# Patient Record
Sex: Female | Born: 1955
Health system: Southern US, Community
[De-identification: ages and names within clinical notes are randomized; demographics above are authoritative.]

## PROBLEM LIST (undated history)

## (undated) ENCOUNTER — Ambulatory Visit: Source: Home / Self Care

## (undated) DIAGNOSIS — M1990 Unspecified osteoarthritis, unspecified site: Secondary | ICD-10-CM

## (undated) DIAGNOSIS — M810 Age-related osteoporosis without current pathological fracture: Secondary | ICD-10-CM

## (undated) DIAGNOSIS — F419 Anxiety disorder, unspecified: Secondary | ICD-10-CM

## (undated) DIAGNOSIS — E785 Hyperlipidemia, unspecified: Secondary | ICD-10-CM

## (undated) DIAGNOSIS — Z9189 Other specified personal risk factors, not elsewhere classified: Secondary | ICD-10-CM

## (undated) DIAGNOSIS — K635 Polyp of colon: Secondary | ICD-10-CM

## (undated) DIAGNOSIS — T7840XA Allergy, unspecified, initial encounter: Secondary | ICD-10-CM

## (undated) DIAGNOSIS — G629 Polyneuropathy, unspecified: Secondary | ICD-10-CM

## (undated) DIAGNOSIS — D649 Anemia, unspecified: Secondary | ICD-10-CM

## (undated) DIAGNOSIS — E049 Nontoxic goiter, unspecified: Secondary | ICD-10-CM

## (undated) DIAGNOSIS — F32A Depression, unspecified: Secondary | ICD-10-CM

## (undated) DIAGNOSIS — I10 Essential (primary) hypertension: Secondary | ICD-10-CM

## (undated) DIAGNOSIS — K2970 Gastritis, unspecified, without bleeding: Secondary | ICD-10-CM

## (undated) DIAGNOSIS — G4730 Sleep apnea, unspecified: Secondary | ICD-10-CM

## (undated) DIAGNOSIS — M797 Fibromyalgia: Secondary | ICD-10-CM

## (undated) DIAGNOSIS — J189 Pneumonia, unspecified organism: Secondary | ICD-10-CM

## (undated) DIAGNOSIS — K449 Diaphragmatic hernia without obstruction or gangrene: Secondary | ICD-10-CM

## (undated) DIAGNOSIS — K219 Gastro-esophageal reflux disease without esophagitis: Secondary | ICD-10-CM

## (undated) DIAGNOSIS — G43909 Migraine, unspecified, not intractable, without status migrainosus: Secondary | ICD-10-CM

## (undated) DIAGNOSIS — F329 Major depressive disorder, single episode, unspecified: Secondary | ICD-10-CM

## (undated) DIAGNOSIS — N189 Chronic kidney disease, unspecified: Secondary | ICD-10-CM

## (undated) HISTORY — DX: Unspecified osteoarthritis, unspecified site: M19.90

## (undated) HISTORY — PX: FRACTURE SURGERY: SHX138

## (undated) HISTORY — DX: Sleep apnea, unspecified: G47.30

## (undated) HISTORY — DX: Polyp of colon: K63.5

## (undated) HISTORY — DX: Hyperlipidemia, unspecified: E78.5

## (undated) HISTORY — DX: Chronic kidney disease, unspecified: N18.9

## (undated) HISTORY — DX: Allergy, unspecified, initial encounter: T78.40XA

## (undated) HISTORY — DX: Anxiety disorder, unspecified: F41.9

## (undated) HISTORY — DX: Diaphragmatic hernia without obstruction or gangrene: K44.9

## (undated) HISTORY — DX: Depression, unspecified: F32.A

## (undated) HISTORY — PX: TUBAL LIGATION: SHX77

## (undated) HISTORY — DX: Anemia, unspecified: D64.9

## (undated) HISTORY — DX: Age-related osteoporosis without current pathological fracture: M81.0

## (undated) HISTORY — DX: Polyneuropathy, unspecified: G62.9

## (undated) HISTORY — PX: JOINT REPLACEMENT: SHX530

## (undated) HISTORY — DX: Nontoxic goiter, unspecified: E04.9

## (undated) HISTORY — DX: Other specified personal risk factors, not elsewhere classified: Z91.89

## (undated) HISTORY — DX: Gastritis, unspecified, without bleeding: K29.70

## (undated) HISTORY — PX: ABDOMINAL HYSTERECTOMY: SHX81

## (undated) HISTORY — PX: PARTIAL HYSTERECTOMY: SHX80

## (undated) HISTORY — DX: Major depressive disorder, single episode, unspecified: F32.9

## (undated) HISTORY — DX: Pneumonia, unspecified organism: J18.9

## (undated) HISTORY — PX: SHOULDER SURGERY: SHX246

---

## 1998-03-28 ENCOUNTER — Encounter: Payer: Self-pay | Admitting: Emergency Medicine

## 1998-03-28 ENCOUNTER — Emergency Department (HOSPITAL_COMMUNITY): Admission: EM | Admit: 1998-03-28 | Discharge: 1998-03-28 | Payer: Self-pay | Admitting: Emergency Medicine

## 1998-04-02 ENCOUNTER — Ambulatory Visit (HOSPITAL_COMMUNITY): Admission: RE | Admit: 1998-04-02 | Discharge: 1998-04-02 | Payer: Self-pay | Admitting: *Deleted

## 2006-01-01 ENCOUNTER — Encounter: Admission: RE | Admit: 2006-01-01 | Discharge: 2006-01-01 | Payer: Self-pay | Admitting: Geriatric Medicine

## 2007-08-05 HISTORY — PX: ROTATOR CUFF REPAIR: SHX139

## 2008-03-09 ENCOUNTER — Ambulatory Visit: Payer: Self-pay | Admitting: Cardiology

## 2008-03-10 ENCOUNTER — Inpatient Hospital Stay (HOSPITAL_COMMUNITY): Admission: EM | Admit: 2008-03-10 | Discharge: 2008-03-11 | Payer: Self-pay | Admitting: Emergency Medicine

## 2008-03-10 ENCOUNTER — Encounter (INDEPENDENT_AMBULATORY_CARE_PROVIDER_SITE_OTHER): Payer: Self-pay | Admitting: Internal Medicine

## 2008-03-10 ENCOUNTER — Ambulatory Visit: Payer: Self-pay | Admitting: Vascular Surgery

## 2010-12-17 NOTE — Discharge Summary (Signed)
[name], [name]               ACCOUNT NO.:  [id number]   MEDICAL RECORD NO.:  [id number]          PATIENT TYPE:  INP   LOCATION:  3041                         FACILITY:  [facility]   PHYSICIAN:  Elliot Cousin, M.D.    DATE OF BIRTH:  February 08, 1956   DATE OF ADMISSION:  03/10/2008  DATE OF DISCHARGE:  03/11/2008                               DISCHARGE SUMMARY   DISCHARGE DIAGNOSES:  1. Dysarthria, unsteady gait, right-sided facial droop, probably      secondary to hypoglycemia.  2. Hypoglycemia secondary to metformin therapy concomitant with a      restrictive diet.  3. Hypokalemia, repleted during the hospitalization.  4. Pre-diabetes mellitus.   DISCHARGE MEDICATIONS:  1. Stop Metformin.  2. Lyrica 150 mg daily.  3. Alprazolam 0.5 mg p.r.n. daily.  4. Fluoxetine 40 mg daily.  5. Oxybutynin 5 mg t.i.d.  6. Topamax 100 mg b.i.d.  7. Treximet 85/500 mg daily p.r.n.  8. Estratest 1.25/2.5 mg daily.  9. Kapidex 60 mg daily.  10.Phentermine 37.5 mg daily.   DISCHARGE DISPOSITION:  The patient is being discharged to home in  improved and stable condition.  She was advised to follow up with Dr.  Quintella Reichert in 1 week.   CONSULTATIONS:  None.   PROCEDURE PERFORMED:  1. MRI of the brain on March 10, 2008.  The results revealed no acute      or reversible process.  Minimal chronic small vessel change in the      hemispheric deep white matter.  2. MRA of the head on March 10, 2008.  The results revealed normal      intracranial MR angiography.  3. MRA of the neck on March 10, 2008.  The results revealed smooth      plaque along the posterior wall of the internal carotid artery bulb      on the right.  No measurable stenosis.  Remainder of the vessels      appeared normal.  4. Chest x-ray on March 10, 2008.  The results revealed low lung      volumes with minimal left base airspace disease, most likely      atelectasis.  5. CT scan of the head on March 09, 2008.  The results revealed  no      acute intracranial abnormality.  6. A 2D echocardiogram, results pending.   HISTORY OF PRESENT ILLNESS:  The patient is a 55 year old woman with a  past medical history significant for migraine headaches, depression, and  fibromyalgia.  She presented to the emergency department on March 10, 2008, after her family noticed that she had a right facial droop and  slurred speech.  When the patient was evaluated in the emergency  department, her venous glucose was noted to be 56.  The patient was  therefore admitted for further evaluation and management.   For additional details, please see the dictated history and physical.   HOSPITAL COURSE:  1. DYSARTHRIA, RIGHT FACIAL DROOP, REPORTED HISTORY OF HEADACHE, and      DIFFICULTY AMBULATING.  The patient was given dextrose in the  emergency department.  A CT scan of the head was ordered and it      revealed no acute intracranial findings.  The dose of the metformin      was decreased to only 500 mg daily.  Her capillary blood glucose      was monitored closely.  For further evaluation, an MRI of the      brain, MRA of the head, MRA of the neck, TSH, cardiac enzymes, and      a fasting lipid panel were ordered.  The results of the MRI and MRA      of the head and neck were essentially unremarkable.  Her cardiac      enzymes were within normal limits.  Her fasting lipid panel      revealed a total cholesterol of 122, triglycerides of 47, HDL of      26, and LDL of 87.  Her TSH was within normal limits at 0.699.      Carotid Doppler study was ordered as well and the results revealed      no bilateral ICA stenosis.  Vertebral artery flow was antegrade.      The results of the 2D echocardiogram are currently pending.   In my conversation with the patient, she disclosed that she had been  never diagnosed with diabetes mellitus; however, she believes that she  was started on metformin therapy because she was at risk and because of   her family history of diabetes and obesity.  In addition, the patient  had been following a restrictive diet consisting of a protein drinks 3  times a day and with little food otherwise.  More than likely, the  patient's symptomatology was secondary to hypoglycemia as a consequence  of metformin therapy and the restrictive diet.  The patient was given  information on a carbohydrate modified diet.  Prior to hospital  discharge, she was advised to discontinue the metformin.  Further  management will be deferred to Dr. Quintella Reichert.  Also, the patient was  advised to consider decreasing the dose of Lyrica from 150 mg t.i.d. to  150 mg just once daily.  1. PROBABLE PRE-DIABETES MELLITUS.  As stated above, the dose of the      metformin was decreased from 850 mg half a tablet t.i.d. to 500 mg      daily during the hospitalization.  Her hemoglobin A1c was 5.5.  As      stated above, the patient was advised to stop the metformin for      now.   The patient was advised to call the [facility] next week to obtain  the results of the 2D echocardiogram.  The patient voiced understanding.      Elliot Cousin, M.D.  Electronically Signed     DF/MEDQ  D:  03/11/2008  T:  03/12/2008  Job:  [id number]   cc:   Lacretia Leigh. Quintella Reichert, M.D.

## 2010-12-17 NOTE — H&P (Signed)
[name], [name]               ACCOUNT NO.:  [id number]   MEDICAL RECORD NO.:  [id number]          PATIENT TYPE:  EMS   LOCATION:  MAJO                         FACILITY:  [facility]   PHYSICIAN:  [name], MDDATE OF BIRTH:  [birth year]   DATE OF ADMISSION:  03/09/2008  DATE OF DISCHARGE:                              HISTORY & PHYSICAL   PRIMARY CARE PHYSICIAN:  Melissa Leigh. Quintella Reichert, MD.   She is a full code.  There is no health care power of attorney.   PRESENTING COMPLAINT:  Headache 3 weeks, slurred speech, unsteady gait,  dizziness, and right facial droop 1 day.   HISTORY OF PRESENTING COMPLAINT:  This 55 year old Caucasian lady has  been having on and off frontal headache for the last 3 weeks.  However,  at 5 p.m. today, while sitting at the dinner table with her husband, the  husband noticed that she was having slurred speech and right facial  droop.  The patient was complaining of dizziness and inability to  express herself properly verbally.  She was also noted to have an  unsteady gait by the husband. transferred by EMT to the emergency room.  On presentation, she had a low blood sugar of 56.  The patient claims  she has been on liquid diet to lose weight in the last few weeks.  She  denies use of laxatives.  All her neurological deficits had been  normalized by the time she got to the emergency room.  Currently, she is  asymptomatic.  However, blood pressure was borderline low at 96/64 at  presentation.  The patient says her primary care physician has stopped  her lisinopril a few weeks ago.   PAST MEDICAL HISTORY:  Depression, diabetes, hypertension, fibromyalgia,  borderline lupus, migraine headache, and osteoarthritis.   PAST SURGERIES:  Dr. Jerl [name], Orthopedics,  partial hysterectomy many years ago.   FAMILY HISTORY:  Father had CVA and myocardial infraction.  Mother had  myocardial infarction.   SOCIAL HISTORY:  She is a former smoker.  No current  history of alcohol  or tobacco abuse.   REVIEW OF SYSTEMS:  Twelve systems reviewed, pertinent positives as in  the history of presenting complaints.   MEDICATIONS AT HOME:  1. Lyrica 150 mg daily.  2. Alprazolam 0.5 mg p.r.n. daily.  3. Fluoxetine 40 mg daily.  4. Metformin 850 mg half tablet b.i.d.  5. Oxybutynin 5 mg t.i.d.  6. Topamax 100 mg b.i.d.  7. Treximet 85/500 daily p.r.n.  8. Estratest 1 daily.  9. Kapidex 1 daily.  10.Phentermine 37.5 mg daily.  11.Lisinopril discontinued.   ALLERGIES:  BACTRIM AND NORGESIC FORTE.   PHYSICAL EXAMINATION:  GENERAL:  She is a middle-aged lady not in acute  respiratory distress.  VITAL SIGNS: Temperature is 97, pulse is 55, respiratory rate is 18, and  blood pressure 106/72.  HEENT:  Pupils are equal, reactive to light and accommodation.  Extraocular muscles are intact.  Head is atraumatic and normocephalic.  Mucous membranes are moist.  Oropharynx and nasopharynx are clear.  NECK:  Supple.  There is no elevated JVD  or thyromegaly.  No  submandibular lymphadenopathy.  Apex beat 5th intercostal space midclavicular line.  HEART:  No gallops, rubs, or murmurs.  CHEST:  Clinically clear.  ABDOMEN:  Benign.  NEUROLOGIC:  She is alert and oriented to time, place, and person.  No  neurological deficits.  Cranial nerves II through XII are intact.  Peripheral pulses are present.  No pedal edema.   LABORATORY DATA:  Labs show WBC of 4.1, hematocrit 41, and platelet  count 245.  Urinalysis is negative.  PT 14.8, INR 1.1, and PTT 33.  CT  of the head is negative for intracranial abnormality.  Chemistry shows a  glucose of 56, sodium 141, potassium 3.4, chloride 106, bicarbonate 15,  BUN 15, and creatinine 1.1.   ASSESSMENT:  Rule out transient ischemic attack versus complex migraine,  hyperglycemia, hyperkalemia, and hypertension.  She will be admitted to  neurological bed initially for observation and for workup with 2D  echocardiogram,  carotid Doppler, ESR, lupus anticoagulant.  We will get  a cardiac enzymes, EKG every 8 hours x3, check fasting lipid, thyroid  function test.  Obtain MRI of the brain in the morning.  Diet will be  heart healthy with 1800-calorie ADA, IV fluids on normal saline at 100  mL an hour with 40 mEq of KCl for the first one liter.   She is to be on bedrest, seizure, fall and aspiration precautions,  bedside swallow evaluation.  Heparin 5000 units subcu q.8 h. for DVT  prophylaxis.  Continue her home medications and reduce her metformin to  500 mg daily, Accu-Cheks q. 4 for the first 12 hours, and D50 half by IV  p.r.n. every 6 hours if glucose is less than 70.  We will hold her  Treximet, Kapidex, Estratest, and lisinopril.  We will repeat her BMP in  the morning.      Melissa Saxon, MD  Electronically Signed     MIO/MEDQ  D:  03/10/2008  T:  03/10/2008  Job:  [id number]

## 2011-05-02 LAB — LIPID PANEL
HDL: 26 — ABNORMAL LOW
Total CHOL/HDL Ratio: 4.7
Triglycerides: 47
VLDL: 9

## 2011-05-02 LAB — HEMOGLOBIN A1C: Hgb A1c MFr Bld: 5.5

## 2011-05-02 LAB — COMPREHENSIVE METABOLIC PANEL
ALT: 11
Alkaline Phosphatase: 43
CO2: 21
Chloride: 112
Glucose, Bld: 98
Potassium: 4
Sodium: 137
Total Bilirubin: 0.7
Total Protein: 5.2 — ABNORMAL LOW

## 2011-05-02 LAB — BASIC METABOLIC PANEL
Calcium: 8 — ABNORMAL LOW
GFR calc Af Amer: >60
GFR calc non Af Amer: >60
Glucose, Bld: 95
Potassium: 3.9
Sodium: 139

## 2011-05-02 LAB — CBC
HCT: 38.2
HCT: 41.9
Hemoglobin: 12.9
Hemoglobin: 14.2
RBC: 4.11
RBC: 4.47
RDW: 12.9
RDW: 12.9
WBC: 3.9 — ABNORMAL LOW
WBC: 4.1

## 2011-05-02 LAB — URINALYSIS, ROUTINE W REFLEX MICROSCOPIC
Glucose, UA: NEGATIVE
Hgb urine dipstick: NEGATIVE
Ketones, ur: NEGATIVE
Protein, ur: NEGATIVE
pH: 5

## 2011-05-02 LAB — CARDIAC PANEL(CRET KIN+CKTOT+MB+TROPI)
CK, MB: 1.4
Relative Index: INVALID
Total CK: 80
Troponin I: <0.01

## 2011-05-02 LAB — CK TOTAL AND CKMB (NOT AT ARMC)
Relative Index: 1.5
Total CK: 106

## 2011-05-02 LAB — PROTIME-INR: Prothrombin Time: 14.8

## 2011-05-02 LAB — CALCIUM: Calcium: 8 — ABNORMAL LOW

## 2011-05-02 LAB — DIFFERENTIAL
Eosinophils Relative: 2
Lymphocytes Relative: 36
Lymphs Abs: 1.5
Monocytes Absolute: 0.4
Monocytes Relative: 11
Neutro Abs: 2.1

## 2011-05-02 LAB — APTT: aPTT: 33

## 2011-05-02 LAB — POCT I-STAT, CHEM 8
BUN: 15
Calcium, Ion: 1.24
Chloride: 106
Creatinine, Ser: 1.1
TCO2: 23

## 2011-05-02 LAB — TROPONIN I: Troponin I: 0.01

## 2011-05-02 LAB — LUPUS ANTICOAGULANT PANEL: DRVVT: 34.3 — ABNORMAL LOW (ref 36.1–47.0)

## 2011-05-02 LAB — PHOSPHORUS: Phosphorus: 2.5

## 2011-11-27 ENCOUNTER — Other Ambulatory Visit (HOSPITAL_COMMUNITY): Payer: Self-pay | Admitting: Endocrinology

## 2011-11-27 DIAGNOSIS — E042 Nontoxic multinodular goiter: Secondary | ICD-10-CM

## 2011-12-08 ENCOUNTER — Ambulatory Visit (HOSPITAL_COMMUNITY)
Admission: RE | Admit: 2011-12-08 | Discharge: 2011-12-08 | Disposition: A | Discharge status: Home / Self Care | Payer: BC Managed Care – PPO | Source: Ambulatory Visit | Attending: Endocrinology | Admitting: Endocrinology

## 2011-12-08 DIAGNOSIS — E042 Nontoxic multinodular goiter: Secondary | ICD-10-CM

## 2011-12-08 NOTE — Procedures (Signed)
US guided FNAs of left mid pole nodule.  No immediate complication.

## 2011-12-09 ENCOUNTER — Telehealth (HOSPITAL_COMMUNITY): Payer: Self-pay | Admitting: *Deleted

## 2013-08-04 HISTORY — PX: SHOULDER SURGERY: SHX246

## 2014-01-25 ENCOUNTER — Emergency Department (HOSPITAL_BASED_OUTPATIENT_CLINIC_OR_DEPARTMENT_OTHER)
Admission: EM | Admit: 2014-01-25 | Discharge: 2014-01-25 | Disposition: A | Discharge status: Home / Self Care | Payer: Worker's Compensation | Attending: Emergency Medicine | Admitting: Emergency Medicine

## 2014-01-25 ENCOUNTER — Encounter (HOSPITAL_BASED_OUTPATIENT_CLINIC_OR_DEPARTMENT_OTHER): Payer: Self-pay | Admitting: Emergency Medicine

## 2014-01-25 ENCOUNTER — Emergency Department (HOSPITAL_BASED_OUTPATIENT_CLINIC_OR_DEPARTMENT_OTHER): Payer: Worker's Compensation

## 2014-01-25 DIAGNOSIS — S43004A Unspecified dislocation of right shoulder joint, initial encounter: Secondary | ICD-10-CM

## 2014-01-25 DIAGNOSIS — X503XXA Overexertion from repetitive movements, initial encounter: Secondary | ICD-10-CM | POA: Insufficient documentation

## 2014-01-25 DIAGNOSIS — G43909 Migraine, unspecified, not intractable, without status migrainosus: Secondary | ICD-10-CM | POA: Insufficient documentation

## 2014-01-25 DIAGNOSIS — S43086A Other dislocation of unspecified shoulder joint, initial encounter: Secondary | ICD-10-CM | POA: Insufficient documentation

## 2014-01-25 DIAGNOSIS — I10 Essential (primary) hypertension: Secondary | ICD-10-CM | POA: Insufficient documentation

## 2014-01-25 DIAGNOSIS — Y9289 Other specified places as the place of occurrence of the external cause: Secondary | ICD-10-CM | POA: Insufficient documentation

## 2014-01-25 DIAGNOSIS — Z8719 Personal history of other diseases of the digestive system: Secondary | ICD-10-CM | POA: Insufficient documentation

## 2014-01-25 DIAGNOSIS — Y990 Civilian activity done for income or pay: Secondary | ICD-10-CM | POA: Insufficient documentation

## 2014-01-25 HISTORY — DX: Gastro-esophageal reflux disease without esophagitis: K21.9

## 2014-01-25 HISTORY — DX: Migraine, unspecified, not intractable, without status migrainosus: G43.909

## 2014-01-25 HISTORY — DX: Essential (primary) hypertension: I10

## 2014-01-25 HISTORY — DX: Fibromyalgia: M79.7

## 2014-01-25 MED ORDER — PROPOFOL BOLUS VIA INFUSION
0.5000 mg/kg | Freq: Once | INTRAVENOUS | Status: AC
  Administered 2014-01-25: 52.15 mg via INTRAVENOUS
  Filled 2014-01-25: qty 53

## 2014-01-25 MED ORDER — OXYCODONE-ACETAMINOPHEN 5-325 MG PO TABS
2.0000 | ORAL_TABLET | ORAL | Status: DC | PRN
Start: 2014-01-25 — End: 2015-05-28

## 2014-01-25 MED ORDER — ONDANSETRON HCL 4 MG/2ML IJ SOLN
4.0000 mg | Freq: Once | INTRAMUSCULAR | Status: AC
  Administered 2014-01-25: 4 mg via INTRAVENOUS

## 2014-01-25 MED ORDER — MORPHINE SULFATE 4 MG/ML IJ SOLN
INTRAMUSCULAR | Status: AC
  Administered 2014-01-25: 4 mg via INTRAVENOUS
  Filled 2014-01-25: qty 1

## 2014-01-25 MED ORDER — ONDANSETRON HCL 4 MG/2ML IJ SOLN
INTRAMUSCULAR | Status: AC
  Administered 2014-01-25: 4 mg via INTRAVENOUS
  Filled 2014-01-25: qty 2

## 2014-01-25 MED ORDER — MORPHINE SULFATE 4 MG/ML IJ SOLN
4.0000 mg | Freq: Once | INTRAMUSCULAR | Status: AC
  Administered 2014-01-25: 4 mg via INTRAVENOUS

## 2014-01-25 MED ORDER — PROPOFOL 10 MG/ML IV BOLUS
INTRAVENOUS | Status: AC
  Administered 2014-01-25: 50 mg
  Filled 2014-01-25: qty 20

## 2014-01-25 NOTE — Discharge Instructions (Signed)
Keep shoulder immobilizer intact until follow up with Dr. Norm Salt. Take percocet as needed for pain. Refer to attached documents ofr more information.

## 2014-01-25 NOTE — ED Provider Notes (Signed)
The patient was sent from an outpatient facility. Patient was seen in an urgent care and had x-rays demonstrating a shoulder dislocation. She was sent to the emergency room for further treatment Physical Exam  BP 149/82  Pulse 62  Temp(Src) 99.5 F (37.5 C) (Oral)  Resp 20  Ht 5\' 6"  (1.676 m)  Wt 230 lb (104.327 kg)  BMI 37.14 kg/m2  SpO2 98%  Physical Exam  Nursing note and vitals reviewed. Constitutional: She appears well-developed and well-nourished. No distress.  HENT:  Head: Normocephalic and atraumatic.  Right Ear: External ear normal.  Left Ear: External ear normal.  Eyes: Conjunctivae are normal. Right eye exhibits no discharge. Left eye exhibits no discharge. No scleral icterus.  Neck: Neck supple. No tracheal deviation present.  Cardiovascular: Normal rate.   Pulmonary/Chest: Effort normal. No stridor. No respiratory distress.  Musculoskeletal: She exhibits no edema.       Right shoulder: She exhibits decreased range of motion, tenderness, bony tenderness and deformity. She exhibits normal pulse and normal strength.  Right glenohumeral dislocation  Neurological: She is alert. Cranial nerve deficit: no gross deficits.  Skin: Skin is warm and dry. No rash noted.  Psychiatric: She has a normal mood and affect.    ED Course  Procedural sedation Date/Time: 01/25/2014 6:39 PM Performed by: Dorie Rank Authorized by: Dorie Rank Consent: Verbal consent obtained. written consent obtained. Risks and benefits: risks, benefits and alternatives were discussed Preparation: Patient was prepped and draped in the usual sterile fashion. Local anesthesia used: no Patient sedated: yes Sedation type: moderate (conscious) sedation Sedatives: propofol Vitals: Vital signs were monitored during sedation. Comments: Please see nursing notes for start and stop times. Approximately 20 minutes for the procedure.  Patient tolerated well without difficulty  ORTHOPEDIC INJURY  TREATMENT Date/Time: 01/25/2014 6:41 PM Performed by: Dorie Rank Authorized by: Dorie Rank Consent: Verbal consent obtained. written consent obtained. Risks and benefits: risks, benefits and alternatives were discussed Consent given by: patient Injury location: shoulder Injury type: dislocation Chronicity: new Pre-procedure neurovascular assessment: neurovascularly intact Pre-procedure distal perfusion: normal Pre-procedure neurological function: normal Pre-procedure range of motion: reduced Local anesthesia used: no Patient sedated: yes Manipulation performed: yes Reduction method: external rotation Reduction successful: yes X-ray confirmed reduction: yes Immobilization: sling Post-procedure neurovascular assessment: post-procedure neurovascularly intact Post-procedure distal perfusion: normal Post-procedure neurological function: normal Post-procedure range of motion: normal   Dg Shoulder Right  01/25/2014   CLINICAL DATA:  Fall.  Right shoulder injury.  EXAM: RIGHT SHOULDER - 2+ VIEW  COMPARISON:  None.  FINDINGS: Two views include in internally rotated view and a transscapular view. An axillary view is not provided.  No dislocation identified. No fracture is observed. Laterally downsloping acromion may predispose to impingement.  Right fifth rib deformity posterolaterally favors old fracture. This was visible on the 2009 chest radiograph.  IMPRESSION: 1. No acute bony findings. 2. Laterally downsloping acromion may predispose to impingement. 3. Old right fifth rib fracture, healed.   Electronically Signed   By: Sherryl Barters M.D.   On: 01/25/2014 18:55    MDM Successful reduction of her right shoulder dislocation. Patient has returned to her pre procedural baseline. She is alert and oriented in no distress.     examination/treatment/procedure(s) were conducted as a shared visit with non-physician practitioner(s) and myself.  I personally evaluated the patient during the  encounter.  I was present at the bedside for the procedural sedation and shoulder reduction.     Dorie Rank, MD 01/28/14 [id number]

## 2014-01-25 NOTE — ED Notes (Signed)
Pt c/o fall x 3 hrs ago , seen at [facility] Dx right shoulder dislocation

## 2014-01-25 NOTE — ED Notes (Signed)
RT at bedside for conscious sedation procedure. Placed on 2 LNC prior to sedation. SAT stable throughout, 95-97%. Spontaneously breathing. RT will monitor as needed

## 2014-01-25 NOTE — ED Provider Notes (Signed)
CSN: [id number]     Arrival date & time 01/25/14  1638 History   First MD Initiated Contact with Patient 01/25/14 1653     Chief Complaint  Patient presents with  . Shoulder Injury     (Consider location/radiation/quality/duration/timing/severity/associated sxs/prior Treatment) HPI Comments: Patient is a 58 year old female who presents from Urgent Care with a right shoulder dislocation. 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 The pain is throbbing and severe without radiation. Patient reports she cannot move her right shoulder. She had xrays done at Urgent Care which showed shoulder dislocation. No other injuries.    Past Medical History  Diagnosis Date  . Migraine   . Hypertension   . GERD (gastroesophageal reflux disease)   . Fibromyalgia    Past Surgical History  Procedure Laterality Date  . Abdominal hysterectomy    . Rotator cuff repair     History reviewed. No pertinent family history. History  Substance Use Topics  . Smoking status: Never Smoker   . Smokeless tobacco: Not on file  . Alcohol Use: No   OB History   Grav Para Term Preterm Abortions TAB SAB Ect Mult Living                 Review of Systems  Constitutional: Negative for fever, chills and fatigue.  HENT: Negative for trouble swallowing.   Eyes: Negative for visual disturbance.  Respiratory: Negative for shortness of breath.   Cardiovascular: Negative for chest pain and palpitations.  Gastrointestinal: Negative for nausea, vomiting, abdominal pain and diarrhea.  Genitourinary: Negative for dysuria and difficulty urinating.  Musculoskeletal: Positive for arthralgias. Negative for neck pain.  Skin: Negative for color change.  Neurological: Negative for dizziness and weakness.  Psychiatric/Behavioral: Negative for dysphoric mood.      Allergies  Sulfa antibiotics  Home Medications   Prior to Admission medications   Medication Sig  Start Date End Date Taking? Authorizing Provider  buPROPion (WELLBUTRIN XL) 300 MG 24 hr tablet Take 300 mg by mouth daily.   Yes Historical Provider, MD  butalbital-acetaminophen-caffeine (FIORICET, ESGIC) 50-325-40 MG per tablet Take by mouth 2 (two) times daily as needed for headache.   Yes Historical Provider, MD  darifenacin (ENABLEX) 15 MG 24 hr tablet Take 60 mg by mouth daily.   Yes Historical Provider, MD  lisinopril (PRINIVIL,ZESTRIL) 20 MG tablet Take 20 mg by mouth daily.   Yes Historical Provider, MD  propranolol (INDERAL) 10 MG tablet Take 120 mg by mouth 3 (three) times daily.   Yes Historical Provider, MD  topiramate (TOPAMAX) 25 MG tablet Take 25 mg by mouth 2 (two) times daily.   Yes Historical Provider, MD   BP 179/98  Pulse 63  Temp(Src) 99.5 F (37.5 C) (Oral)  Resp 16  Ht 5\' 6"  (1.676 m)  Wt 230 lb (104.327 kg)  BMI 37.14 kg/m2  SpO2 98% Physical Exam  Nursing note and vitals reviewed. Constitutional: She is oriented to person, place, and time. She appears well-developed and well-nourished. No distress.  HENT:  Head: Normocephalic and atraumatic.  Eyes: Conjunctivae are normal.  Cardiovascular: Normal rate, regular rhythm and intact distal pulses.  Exam reveals no gallop and no friction rub.   No murmur heard. Pulmonary/Chest: Effort normal and breath sounds normal. She has no wheezes. She has no rales. She exhibits no tenderness.  Musculoskeletal:  Right shoulder obvious deformity with generalized tenderness to palpation. Limited ROM due to  pain and deformity.   Neurological: She is alert and oriented to person, place, and time. Coordination normal.  Sensation intact of distal right arm. Speech is goal-oriented. Moves limbs without ataxia.   Skin: Skin is warm and dry.  Psychiatric: She has a normal mood and affect. Her behavior is normal.    ED Course  Reduction of dislocation Date/Time: 01/25/2014 6:06 PM Performed by: Alvina Chou Authorized by:  Alvina Chou Consent: Verbal consent obtained. written consent obtained. Consent given by: patient Patient understanding: patient states understanding of the procedure being performed Patient consent: the patient's understanding of the procedure matches consent given Test results: test results available and properly labeled Patient identity confirmed: verbally with patient Time out: Immediately prior to procedure a "time out" was called to verify the correct patient, procedure, equipment, support staff and site/side marked as required. Local anesthesia used: no Patient sedated: yes Sedation type: moderate (conscious) sedation Sedatives: propofol Analgesia: morphine Sedation start date/time: 01/25/2014 6:02 PM Sedation end date/time: 01/25/2014 6:06 PM Vitals: Vital signs were monitored during sedation. Patient tolerance: Patient tolerated the procedure well with no immediate complications.   SPLINT APPLICATION Date/Time: 0:09 PM Authorized by: Alvina Chou Consent: Verbal consent obtained. Risks and benefits: risks, benefits and alternatives were discussed Consent given by: patient Splint applied by: me Location details: right shoulder Splint type: shoulder immobilizer Supplies used: shoulder immobilizer Post-procedure: The splinted body part was neurovascularly unchanged following the procedure. Patient tolerance: Patient tolerated the procedure well with no immediate complications.     (including critical care time)  Labs Review Labs Reviewed - No data to display  Imaging Review Dg Shoulder Right  01/25/2014   CLINICAL DATA:  Fall.  Right shoulder injury.  EXAM: RIGHT SHOULDER - 2+ VIEW  COMPARISON:  None.  FINDINGS: Two views include in internally rotated view and a transscapular view. An axillary view is not provided.  No dislocation identified. No fracture is observed. Laterally downsloping acromion may predispose to impingement.  Right fifth rib deformity  posterolaterally favors old fracture. This was visible on the 2009 chest radiograph.  IMPRESSION: 1. No acute bony findings. 2. Laterally downsloping acromion may predispose to impingement. 3. Old right fifth rib fracture, healed.   Electronically Signed   By: Sherryl Barters M.D.   On: 01/25/2014 18:55     EKG Interpretation None      MDM   Final diagnoses:  Shoulder dislocation, right, initial encounter    5:31 PM Patient's xray from urgent care shows likely dislocation. Patient will be sedated for shoulder reduction.   7:51 PM Repeat shoulder xray shows reduction of dislocation. Patient will have orthopedic follow up. Patient will remain in arm sling until follow up. Patient will have percocet for pain. No neurovascular compromise.    Alvina Chou, PA-C 01/25/14 2014

## 2014-01-25 NOTE — ED Notes (Signed)
Pt alert, NAD, calm, interactive, resps e/u, speaking in clear complete sentences, "feels much better", R arm in sling, CMS intact, mentions some numbness along ulnar FA hand and fingers ("mild"). Cap refill <2sec. Husband at [facility].

## 2014-08-19 HISTORY — PX: ULNAR NERVE REPAIR: SHX2594

## 2015-05-28 ENCOUNTER — Ambulatory Visit (INDEPENDENT_AMBULATORY_CARE_PROVIDER_SITE_OTHER): Payer: 59 | Admitting: Family Medicine

## 2015-05-28 ENCOUNTER — Encounter: Payer: Self-pay | Admitting: Family Medicine

## 2015-05-28 VITALS — BP 149/88 | HR 63 | Temp 97.9°F | Resp 16 | Wt 225.4 lb

## 2015-05-28 DIAGNOSIS — I10 Essential (primary) hypertension: Secondary | ICD-10-CM | POA: Diagnosis not present

## 2015-05-28 DIAGNOSIS — K219 Gastro-esophageal reflux disease without esophagitis: Secondary | ICD-10-CM | POA: Diagnosis not present

## 2015-05-28 DIAGNOSIS — M797 Fibromyalgia: Secondary | ICD-10-CM | POA: Insufficient documentation

## 2015-05-28 DIAGNOSIS — E049 Nontoxic goiter, unspecified: Secondary | ICD-10-CM

## 2015-05-28 DIAGNOSIS — N183 Chronic kidney disease, stage 3 unspecified: Secondary | ICD-10-CM

## 2015-05-28 DIAGNOSIS — Z8669 Personal history of other diseases of the nervous system and sense organs: Secondary | ICD-10-CM

## 2015-05-28 DIAGNOSIS — R635 Abnormal weight gain: Secondary | ICD-10-CM | POA: Diagnosis not present

## 2015-05-28 DIAGNOSIS — R350 Frequency of micturition: Secondary | ICD-10-CM | POA: Diagnosis not present

## 2015-05-28 DIAGNOSIS — F4323 Adjustment disorder with mixed anxiety and depressed mood: Secondary | ICD-10-CM | POA: Insufficient documentation

## 2015-05-28 DIAGNOSIS — Z23 Encounter for immunization: Secondary | ICD-10-CM

## 2015-05-28 DIAGNOSIS — Z1239 Encounter for other screening for malignant neoplasm of breast: Secondary | ICD-10-CM

## 2015-05-28 DIAGNOSIS — G4733 Obstructive sleep apnea (adult) (pediatric): Secondary | ICD-10-CM | POA: Insufficient documentation

## 2015-05-28 DIAGNOSIS — G894 Chronic pain syndrome: Secondary | ICD-10-CM

## 2015-05-28 DIAGNOSIS — Z1321 Encounter for screening for nutritional disorder: Secondary | ICD-10-CM | POA: Diagnosis not present

## 2015-05-28 LAB — POCT URINALYSIS DIP (MANUAL ENTRY)
BILIRUBIN UA: NEGATIVE
Glucose, UA: NEGATIVE
LEUKOCYTES UA: NEGATIVE
Nitrite, UA: NEGATIVE
PROTEIN UA: NEGATIVE
Spec Grav, UA: 1.025
Urobilinogen, UA: 0.2
pH, UA: 5.5

## 2015-05-28 LAB — LIPID PANEL
Cholesterol: 219 mg/dL — ABNORMAL HIGH (ref 125–200)
HDL: 55 mg/dL (ref >=46)
LDL CALC: 135 mg/dL — AB (ref <130)
Total CHOL/HDL Ratio: 4 Ratio (ref <=5.0)
Triglycerides: 143 mg/dL (ref <150)
VLDL: 29 mg/dL (ref <30)

## 2015-05-28 LAB — COMPREHENSIVE METABOLIC PANEL
ALT: 22 U/L (ref 6–29)
AST: 29 U/L (ref 10–35)
Albumin: 4.2 g/dL (ref 3.6–5.1)
Alkaline Phosphatase: 68 U/L (ref 33–130)
BUN: 10 mg/dL (ref 7–25)
CHLORIDE: 106 mmol/L (ref 98–110)
CO2: 28 mmol/L (ref 20–31)
CREATININE: 0.86 mg/dL (ref 0.50–1.05)
Calcium: 9.6 mg/dL (ref 8.6–10.4)
Glucose, Bld: 91 mg/dL (ref 65–99)
Potassium: 4.2 mmol/L (ref 3.5–5.3)
SODIUM: 142 mmol/L (ref 135–146)
Total Bilirubin: 0.5 mg/dL (ref 0.2–1.2)
Total Protein: 7.1 g/dL (ref 6.1–8.1)

## 2015-05-28 LAB — CBC
HCT: 40 % (ref 36.0–46.0)
Hemoglobin: 13.7 g/dL (ref 12.0–15.0)
MCH: 30 pg (ref 26.0–34.0)
MCHC: 34.3 g/dL (ref 30.0–36.0)
MCV: 87.7 fL (ref 78.0–100.0)
MPV: 10.2 fL (ref 8.6–12.4)
PLATELETS: 225 10*3/uL (ref 150–400)
RBC: 4.56 MIL/uL (ref 3.87–5.11)
RDW: 13.2 % (ref 11.5–15.5)
WBC: 5.4 10*3/uL (ref 4.0–10.5)

## 2015-05-28 LAB — POC MICROSCOPIC URINALYSIS (UMFC): MUCUS RE: ABSENT

## 2015-05-28 MED ORDER — TOPIRAMATE 50 MG PO TABS: ORAL_TABLET | ORAL | Status: DC

## 2015-05-28 MED ORDER — PROPRANOLOL HCL ER 120 MG PO CP24: 120.0000 mg | ORAL_CAPSULE | Freq: Every day | ORAL | Status: DC

## 2015-05-28 MED ORDER — FLUOXETINE HCL 40 MG PO CAPS: 40.0000 mg | ORAL_CAPSULE | Freq: Every day | ORAL | Status: DC

## 2015-05-28 MED ORDER — DEXLANSOPRAZOLE 60 MG PO CPDR: 60.0000 mg | DELAYED_RELEASE_CAPSULE | Freq: Every day | ORAL | Status: DC

## 2015-05-28 MED ORDER — DARIFENACIN HYDROBROMIDE ER 15 MG PO TB24: 60.0000 mg | ORAL_TABLET | Freq: Every day | ORAL | Status: DC

## 2015-05-28 MED ORDER — GABAPENTIN 300 MG PO CAPS: ORAL_CAPSULE | ORAL | Status: DC

## 2015-05-28 MED ORDER — LISINOPRIL 20 MG PO TABS: 20.0000 mg | ORAL_TABLET | Freq: Every day | ORAL | Status: DC

## 2015-05-28 MED ORDER — ALPRAZOLAM 0.5 MG PO TABS: 0.5000 mg | ORAL_TABLET | Freq: Every evening | ORAL | Status: DC | PRN

## 2015-05-28 MED ORDER — BUPROPION HCL ER (XL) 300 MG PO TB24: 300.0000 mg | ORAL_TABLET | Freq: Every day | ORAL | Status: DC

## 2015-05-28 MED ORDER — OXYCODONE-ACETAMINOPHEN 5-325 MG PO TABS: 1.0000 | ORAL_TABLET | Freq: Three times a day (TID) | ORAL | Status: DC | PRN

## 2015-05-28 NOTE — Progress Notes (Signed)
Subjective:    Patient ID: [name], female    DOB: Jan 12, 1956, 59 y.o.   MRN: [id number]  HPI This is a pleasant 59 yo female who is accompanied by her husband. She presents today to establish care. She was previously seen at [facility]. She had a work related injury 16 months ago and has been out of work Museum/gallery exhibitions officer). She had to have shoulder surgery and ulnar nerve decompression surgery by Dr. Latanya Maudlin. Has been released from Dr. Latanya Maudlin. Is currently seeking disability and has an Forensic psychologist. Has seen Dr. Gaynelle Arabian (hand specialist), who has told her not to expect any improvement. She would like to see Dr. Amedeo Plenty. Takes percocet 2x/day. Had 120 filled 01/25/15. Still has 10-20 tablets remaining.   Has struggled with depression and anxiety her entire life. Has had more difficulties lately with problems with her arm and not being able to do her pottery. Has noticed more depression in the last several weeks. Thinks this is related to the weather getting colder and the anniversary of a relative's death by suicide.  Has chronic pain and fibromyalgia. Has poor sleep and uses CPAP for OSA. Was on lyrica which helped. Stopped due to side effects- weight gain, constipation, fatigue.   Has history of headaches, has good control with topiramate. Has headache 1-2x / month.   Overactive bladder for many years, has taken medication for at least 10 years. Nocturia x 2-3x. Has never seen urology. Has never had urodynamic testing- was scheduled and she had to cancel due to work.   Was diagnosed Stage 3 CKD. Has not been seen by nephrology.   Had screening colonoscopy- due in about 5 years Mammo- over due  Past Medical History  Diagnosis Date  . Migraine   . Hypertension   . GERD (gastroesophageal reflux disease)   . Fibromyalgia   . Allergy   . Anemia   . Anxiety   . Depression   . Chronic kidney disease   . Osteoporosis   . Thyroid disease    Past Surgical History  Procedure  Laterality Date  . Abdominal hysterectomy    . Rotator cuff repair     Family History  Problem Relation Age of Onset  . Heart disease Mother   . Hypertension Mother   . Cancer Father   . Heart disease Father   . Hypertension Father   . Stroke Father    Social History  Substance Use Topics  . Smoking status: Never Smoker   . Smokeless tobacco: None  . Alcohol Use: No    Review of Systems Per HPI    Objective:   Physical Exam Physical Exam  Constitutional: Oriented to person, place, and time. She appears well-developed and well-nourished.  HENT:  Head: Normocephalic and atraumatic.  Eyes: Conjunctivae are normal.  Neck: Normal range of motion. Neck supple.  Cardiovascular: Normal rate, regular rhythm and normal heart sounds.   Pulmonary/Chest: Effort normal and breath sounds normal.  Musculoskeletal: She is wearing a full arm compression sleeve on her right arm. She has decreased ROM of right shoulder, strength right shoulder 3/5, right biceps 3/5, right triceps 3/5 Neurological: Alert and oriented to person, place, and time.  Skin: Skin is warm and dry.  Psychiatric: Normal mood and affect. Behavior is normal. Judgment and thought content normal.  Vitals reviewed. BP 149/88 mmHg  Pulse 63  Temp(Src) 97.9 F (36.6 C) (Oral)  Resp 16  Wt 225 lb 6.4 oz (102.241 kg)  Assessment & Plan:  Had discussion with patient and her husband regarding prioritizing her care. Will refill meds while awaiting records from previous provider.  1. Need for prophylactic vaccination and inoculation against influenza - Flu Vaccine QUAD 36+ mos IM  2. Chronic pain syndrome - she will consult with her attorney regarding getting a second opinion on her arm. I offered her local or tertiary care referral. Not sure if she could benefit from PT/OT to improve quality of life.  - oxyCODONE-acetaminophen (PERCOCET/ROXICET) 5-325 MG tablet; Take 1 tablet by mouth every 8 (eight) hours as needed for  moderate pain or severe pain.  Dispense: 60 tablet; Refill: 0 - gabapentin (NEURONTIN) 300 MG capsule; Take 1 tablet at bedtime for 1 week, then increase to 1 tablet twice a day.  Dispense: 60 capsule; Refill: 3  3. Fibromyalgia - discussed importance of regular sleep and quality exercise  4. OSA (obstructive sleep apnea) - continue CPAP  5. Adjustment disorder with mixed anxiety and depressed mood -again, I encouraged regular exercise, focus on sleep and encouraged her to establish a regular routine since she is not working currently.  - ALPRAZolam (XANAX) 0.5 MG tablet; Take 1 tablet (0.5 mg total) by mouth at bedtime as needed for anxiety.  Dispense: 30 tablet; Refill: 1 - buPROPion (WELLBUTRIN XL) 300 MG 24 hr tablet; Take 1 tablet (300 mg total) by mouth daily.  Dispense: 90 tablet; Refill: 1 - FLUoxetine (PROZAC) 40 MG capsule; Take 1 capsule (40 mg total) by mouth daily.  Dispense: 90 capsule; Refill: 1  6. Chronic kidney disease, stage 3 (moderate) - POCT urinalysis dipstick - POCT Microscopic Urinalysis ([facility]) - CBC - Comprehensive metabolic panel - Microalbumin, urine  7. Gastroesophageal reflux disease, esophagitis presence not specified  - dexlansoprazole (DEXILANT) 60 MG capsule; Take 1 capsule (60 mg total) by mouth daily.  Dispense: 90 capsule; Refill: 1  8. Weight gain - Lipid panel - TSH  9. Encounter for vitamin deficiency screening - Vit D  25 hydroxy (rtn osteoporosis monitoring)  10. Goiter - TSH  11. Urinary frequency - darifenacin (ENABLEX) 15 MG 24 hr tablet; Take 4 tablets (60 mg total) by mouth daily. (Patient taking differently: Take 15 mg by mouth daily. )  Dispense: 90 tablet; Refill: 1 - discussed urology referral in future  12. Essential hypertension - lisinopril (PRINIVIL,ZESTRIL) 20 MG tablet; Take 1 tablet (20 mg total) by mouth daily.  Dispense: 90 tablet; Refill: 1 - propranolol ER (INDERAL LA) 120 MG 24 hr capsule; Take 1 capsule (120 mg  total) by mouth daily.  Dispense: 90 capsule; Refill: 1  13. Hx of migraine headaches - topiramate (TOPAMAX) 50 MG tablet; Take 1-2 tablets nightly for headache prevention  Dispense: 90 tablet; Refill: 1  14. Screening for breast cancer - MM Digital Screening; Future  Follow up in 4-8 weeks for CPE Clarene Reamer, FNP-BC  [facility] [place]  06/01/2015 3:45 PM

## 2015-05-29 ENCOUNTER — Other Ambulatory Visit: Payer: Self-pay

## 2015-05-29 DIAGNOSIS — Z1231 Encounter for screening mammogram for malignant neoplasm of breast: Secondary | ICD-10-CM

## 2015-05-29 LAB — TSH: TSH: 0.881 u[IU]/mL (ref 0.350–4.500)

## 2015-05-29 LAB — MICROALBUMIN, URINE: Microalb, Ur: 1.2 mg/dL

## 2015-05-29 LAB — VITAMIN D 25 HYDROXY (VIT D DEFICIENCY, FRACTURES): VIT D 25 HYDROXY: 31 ng/mL (ref 30–100)

## 2015-05-30 ENCOUNTER — Other Ambulatory Visit: Payer: Self-pay

## 2015-05-30 DIAGNOSIS — R350 Frequency of micturition: Secondary | ICD-10-CM

## 2015-05-30 DIAGNOSIS — Z8669 Personal history of other diseases of the nervous system and sense organs: Secondary | ICD-10-CM

## 2015-05-30 NOTE — Telephone Encounter (Signed)
Pharm faxed req to send a full 90 day Rx for these two meds. Debbie, you had filled other Rxs for 90 days w/1 RF, so I changed the # to reflect 90 day RFs, but wanted to check with you to make sure you didn't want to give less for some reason.

## 2015-05-30 NOTE — Telephone Encounter (Signed)
I spoke with pharmacist regarding these prescriptions. No need to fax.

## 2015-06-01 DIAGNOSIS — N189 Chronic kidney disease, unspecified: Secondary | ICD-10-CM | POA: Insufficient documentation

## 2015-06-01 DIAGNOSIS — Z8669 Personal history of other diseases of the nervous system and sense organs: Secondary | ICD-10-CM | POA: Insufficient documentation

## 2015-06-01 DIAGNOSIS — K219 Gastro-esophageal reflux disease without esophagitis: Secondary | ICD-10-CM | POA: Insufficient documentation

## 2015-06-01 DIAGNOSIS — I10 Essential (primary) hypertension: Secondary | ICD-10-CM | POA: Insufficient documentation

## 2015-06-01 DIAGNOSIS — E049 Nontoxic goiter, unspecified: Secondary | ICD-10-CM | POA: Insufficient documentation

## 2015-06-25 ENCOUNTER — Encounter: Payer: Self-pay | Admitting: Family Medicine

## 2015-06-25 ENCOUNTER — Ambulatory Visit (INDEPENDENT_AMBULATORY_CARE_PROVIDER_SITE_OTHER): Payer: 59 | Admitting: Family Medicine

## 2015-06-25 VITALS — BP 155/87 | HR 57 | Temp 98.1°F | Resp 16 | Ht 66.0 in | Wt 224.0 lb

## 2015-06-25 DIAGNOSIS — F4323 Adjustment disorder with mixed anxiety and depressed mood: Secondary | ICD-10-CM

## 2015-06-25 DIAGNOSIS — Z0000 Encounter for general adult medical examination without abnormal findings: Secondary | ICD-10-CM | POA: Diagnosis not present

## 2015-06-25 DIAGNOSIS — Z87898 Personal history of other specified conditions: Secondary | ICD-10-CM

## 2015-06-25 DIAGNOSIS — N3946 Mixed incontinence: Secondary | ICD-10-CM | POA: Diagnosis not present

## 2015-06-25 DIAGNOSIS — I10 Essential (primary) hypertension: Secondary | ICD-10-CM | POA: Diagnosis not present

## 2015-06-25 DIAGNOSIS — Z23 Encounter for immunization: Secondary | ICD-10-CM | POA: Diagnosis not present

## 2015-06-25 DIAGNOSIS — Z87448 Personal history of other diseases of urinary system: Secondary | ICD-10-CM | POA: Diagnosis not present

## 2015-06-25 DIAGNOSIS — G894 Chronic pain syndrome: Secondary | ICD-10-CM | POA: Diagnosis not present

## 2015-06-25 MED ORDER — ALPRAZOLAM 0.5 MG PO TABS: 0.5000 mg | ORAL_TABLET | Freq: Every evening | ORAL | Status: DC | PRN

## 2015-06-25 NOTE — Progress Notes (Signed)
Subjective:    Patient ID: [name], female    DOB: July 14, 1956, 59 y.o.   MRN: [id number]  HPI This is a pleasant 59 yo female who presents today for CPE. She has been doing a little better with her depression since I saw her last month. Change in the weather has bothered her arm pain. Her husband is having surgery to remove a large kidney stone tomorrow.   Last CPE- last year Mammo- scheduled for 07/16/15 Pap- hysterectomy 1993 Colonoscopy- 2011 Tdap- unknown, will have today Flu- annual Eye- regular  Dental- regular Exercise- walks.   Past Medical History  Diagnosis Date  . Migraine   . Hypertension   . GERD (gastroesophageal reflux disease)   . Fibromyalgia   . Allergy   . Anemia   . Anxiety   . Depression   . Chronic kidney disease   . Osteoporosis   . Thyroid disease    Past Surgical History  Procedure Laterality Date  . Abdominal hysterectomy    . Rotator cuff repair     Family History  Problem Relation Age of Onset  . Heart disease Mother   . Hypertension Mother   . Cancer Father   . Heart disease Father   . Hypertension Father   . Stroke Father    Social History  Substance Use Topics  . Smoking status: Never Smoker   . Smokeless tobacco: None  . Alcohol Use: No   Review of Systems  Constitutional: Positive for diaphoresis (many years) and fatigue (chronic).  HENT: Positive for congestion, ear pain, rhinorrhea and sinus pressure.   Eyes: Positive for photophobia, itching and visual disturbance.  Respiratory: Positive for cough (rarely productive).   Gastrointestinal: Positive for abdominal pain and constipation (uses dulcolax 3x/ week for 3x week BM).  Genitourinary: Positive for urgency and frequency.  Musculoskeletal: Positive for myalgias, back pain, arthralgias, neck pain and neck stiffness.  Skin: Negative.   Allergic/Immunologic: Positive for environmental allergies.  Neurological: Positive for weakness, light-headedness, numbness and  headaches.  Hematological: Negative.   Psychiatric/Behavioral: Positive for sleep disturbance (sleeping a little better), decreased concentration and agitation.       Objective:   Physical Exam Physical Exam  Constitutional: She is oriented to person, place, and time. She appears well-developed and well-nourished. No distress.  HENT:  Head: Normocephalic and atraumatic.  Right Ear: External ear normal.  Left Ear: External ear normal.  Nose: Nose normal.  Mouth/Throat: Oropharynx is clear and moist. No oropharyngeal exudate.  Eyes: Conjunctivae are normal. Pupils are equal, round, and reactive to light.  Neck: Normal range of motion. Neck supple. No JVD present. No thyromegaly present.  Cardiovascular: Normal rate, regular rhythm, normal heart sounds and intact distal pulses.   Pulmonary/Chest: Effort normal and breath sounds normal. Right breast exhibits no inverted nipple, no mass, no nipple discharge, no skin change and no tenderness. Left breast exhibits no inverted nipple, no mass, no nipple discharge, no skin change and no tenderness. Breasts are symmetrical.  Abdominal: Soft. Bowel sounds are normal. She exhibits no distension and no mass. There is no tenderness. There is no rebound and no guarding.  Genitourinary: Vagina normal. Pelvic exam was performed with patient supine. There is no rash, tenderness, lesion or injury on the right labia. There is no rash, tenderness, lesion or injury on the left labia. Cervix surgically absent. No vaginal discharge found. Cystocele noted.  Musculoskeletal: Right shoulder with compression sleeve. Decreased ROM. Lymphadenopathy:    She has no  cervical adenopathy.  Neurological: She is alert and oriented to person, place, and time. She has normal reflexes.  Skin: Skin is warm and dry. She is not diaphoretic.  Psychiatric: She has a normal mood and affect. Her behavior is normal. Judgment and thought content normal.  Vitals reviewed.  BP 155/87  mmHg  Pulse 57  Temp(Src) 98.1 F (36.7 C) (Oral)  Resp 16  Ht 5\' 6"  (1.676 m)  Wt 224 lb (101.606 kg)  BMI 36.17 kg/m2 Wt Readings from Last 3 Encounters:  06/25/15 224 lb (101.606 kg)  05/28/15 225 lb 6.4 oz (102.241 kg)  01/25/14 230 lb (104.327 kg)   Depression screen [facility] 2/9 06/25/2015 05/28/2015  Decreased Interest 1 0  Down, Depressed, Hopeless 1 0  PHQ - 2 Score 2 0  Altered sleeping 1 -  Tired, decreased energy 1 -  Change in appetite 1 -  Feeling bad or failure about yourself  1 -  Trouble concentrating 1 -  Moving slowly or fidgety/restless 0 -  Suicidal thoughts 0 -  PHQ-9 Score 7 -      Assessment & Plan:  1. Annual physical exam - labs were drawn at last visit - Encouraged increased physical activity as tolerated, stress relieving measures (meditation, stretching), good food choices, good sleep hygeine.   2. Adjustment disorder with mixed anxiety and depressed mood - ALPRAZolam (XANAX) 0.5 MG tablet; Take 1 tablet (0.5 mg total) by mouth at bedtime as needed for anxiety.  Dispense: 30 tablet; Refill: 2  3. Need for Tdap vaccination - Tdap vaccine greater than or equal to 7yo IM  4. Essential hypertension - BP a little high today, will recheck at follow up  5. H/O urinary frequency - urine ok, discussed presence of cystocele. Offered urology referral. Patient will defer this until later when her husband has completed his surgery and recoverd.  6. Mixed incontinence - see #5  - follow up 3 months  Clarene Reamer, FNP-BC  [facility], [place]  06/28/2015 2:58 PM

## 2015-06-25 NOTE — Patient Instructions (Signed)
Continue to try to increase your activity level as you can tolerate- add more walking, stretch several times a day Try some heat for your shoulder Increase your gabapentin to two tablets at bedtime. Continue to take one tablet in the morning.

## 2015-06-28 MED ORDER — GABAPENTIN 300 MG PO CAPS: ORAL_CAPSULE | ORAL | Status: DC

## 2015-07-16 ENCOUNTER — Ambulatory Visit
Admission: RE | Admit: 2015-07-16 | Discharge: 2015-07-16 | Disposition: A | Discharge status: Home / Self Care | Payer: 59 | Source: Ambulatory Visit | Attending: Family Medicine | Admitting: Family Medicine

## 2015-07-16 DIAGNOSIS — Z1231 Encounter for screening mammogram for malignant neoplasm of breast: Secondary | ICD-10-CM

## 2015-08-18 ENCOUNTER — Telehealth: Payer: Self-pay

## 2015-08-18 NOTE — Telephone Encounter (Signed)
Pt Rescheduled 3 month follow up to 09/05/2015, but thinks she might run out of her medications before then. Would like to know if they can be refilled prior, Please advise patient

## 2015-08-20 MED FILL — GABAPENTIN 300 MG CAPSULE: 300 | 30 days supply | Qty: 90 | Fill #1

## 2015-08-20 MED FILL — ALPRAZolam 0.5 MG TABS: 0.5 | 30 days supply | Qty: 30 | Fill #1

## 2015-08-21 ENCOUNTER — Other Ambulatory Visit: Payer: Self-pay | Admitting: Family Medicine

## 2015-08-21 DIAGNOSIS — G894 Chronic pain syndrome: Secondary | ICD-10-CM

## 2015-08-21 MED ORDER — OXYCODONE-ACETAMINOPHEN 5-325 MG PO TABS: 1.0000 | ORAL_TABLET | Freq: Three times a day (TID) | ORAL | Status: DC | PRN

## 2015-08-21 NOTE — Telephone Encounter (Signed)
Please call patient and let her know that I left her prescription at the front dest at [address].

## 2015-08-21 NOTE — Telephone Encounter (Signed)
Pt states she may run out of Oxycodone and is requestiong a refill since her appt was changed. Please advise

## 2015-08-22 MED FILL — OXYCODONE/APAP 5-325: 5-325 | 20 days supply | Qty: 60 | Fill #0

## 2015-08-22 NOTE — Telephone Encounter (Signed)
Spoke to pt and informed her rx is ready for pick up

## 2015-09-05 ENCOUNTER — Ambulatory Visit (INDEPENDENT_AMBULATORY_CARE_PROVIDER_SITE_OTHER): Payer: 59 | Admitting: Family Medicine

## 2015-09-05 ENCOUNTER — Encounter: Payer: Self-pay | Admitting: Family Medicine

## 2015-09-05 VITALS — BP 131/81 | HR 60 | Temp 98.3°F | Resp 16 | Ht 64.25 in | Wt 220.6 lb

## 2015-09-05 DIAGNOSIS — R319 Hematuria, unspecified: Secondary | ICD-10-CM

## 2015-09-05 DIAGNOSIS — R110 Nausea: Secondary | ICD-10-CM

## 2015-09-05 DIAGNOSIS — I10 Essential (primary) hypertension: Secondary | ICD-10-CM | POA: Diagnosis not present

## 2015-09-05 DIAGNOSIS — G43909 Migraine, unspecified, not intractable, without status migrainosus: Secondary | ICD-10-CM

## 2015-09-05 LAB — BASIC METABOLIC PANEL WITH GFR
BUN: 14 mg/dL (ref 7–25)
CALCIUM: 9.2 mg/dL (ref 8.6–10.4)
CHLORIDE: 108 mmol/L (ref 98–110)
CO2: 22 mmol/L (ref 20–31)
CREATININE: 1.08 mg/dL — AB (ref 0.50–1.05)
GFR, Est African American: 65 mL/min (ref >=60)
GFR, Est Non African American: 56 mL/min — ABNORMAL LOW (ref >=60)
Glucose, Bld: 89 mg/dL (ref 65–99)
Potassium: 4.2 mmol/L (ref 3.5–5.3)
Sodium: 140 mmol/L (ref 135–146)

## 2015-09-05 LAB — POC MICROSCOPIC URINALYSIS (UMFC): MUCUS RE: ABSENT

## 2015-09-05 LAB — POCT URINALYSIS DIP (MANUAL ENTRY)
GLUCOSE UA: NEGATIVE
NITRITE UA: NEGATIVE
PH UA: 5.5
Protein Ur, POC: NEGATIVE
Spec Grav, UA: 1.025
Urobilinogen, UA: 0.2

## 2015-09-05 MED ORDER — CYCLOBENZAPRINE HCL 10 MG PO TABS: 10.0000 mg | ORAL_TABLET | Freq: Three times a day (TID) | ORAL | Status: DC | PRN

## 2015-09-05 MED ORDER — ONDANSETRON 8 MG PO TBDP: 8.0000 mg | ORAL_TABLET | Freq: Three times a day (TID) | ORAL | Status: DC | PRN

## 2015-09-05 MED ORDER — BUTALBITAL-APAP-CAFFEINE 50-325-40 MG PO TABS: ORAL_TABLET | ORAL | Status: DC

## 2015-09-05 MED FILL — CYCLOBENZAPRINE 10 MG TAB: 10 | 10 days supply | Qty: 30 | Fill #0

## 2015-09-05 MED FILL — BUTALBITAL/APAP/CAFFEINE TB: 50-325-40 | 4 days supply | Qty: 14 | Fill #0

## 2015-09-05 MED FILL — ONDANSETRON ODT 8 MG TABLET: 8 | 7 days supply | Qty: 20 | Fill #0

## 2015-09-05 NOTE — Progress Notes (Signed)
Subjective:    Patient ID: [name], female    DOB: 20-Feb-1956, 61 y.o.   MRN: [id number]  HPI This is a pleasant 60 yo female who presents today for follow up of hematuria, fibromyalgia and chronic pain.  She recently had a bad migraine. She gets them infrequently. She eventually got relief with fioricet. Started in her shoulders and neck, lasted several days. She had photophobia, nausea. Not sure of trigger. Requests refill of fioricet.  Pain in her right arm and shoulder stable. She is taking 1-2 percocet daily. Thinks pain is better with gabapentin. She has noticed more dreams since starting gabapentin, but reports better sleep quality.   Past Medical History  Diagnosis Date  . Migraine   . Hypertension   . GERD (gastroesophageal reflux disease)   . Fibromyalgia   . Allergy   . Anemia   . Anxiety   . Depression   . Chronic kidney disease   . Osteoporosis   . Thyroid disease    Past Surgical History  Procedure Laterality Date  . Abdominal hysterectomy    . Rotator cuff repair     Family History  Problem Relation Age of Onset  . Heart disease Mother   . Hypertension Mother   . Cancer Father   . Heart disease Father   . Hypertension Father   . Stroke Father    Social History  Substance Use Topics  . Smoking status: Never Smoker   . Smokeless tobacco: None  . Alcohol Use: No   Review of Systems  Constitutional: Negative for fever.  HENT: Positive for congestion and sinus pressure.   Eyes: Positive for photophobia. Negative for visual disturbance.  Respiratory: Negative for cough, chest tightness and shortness of breath.   Cardiovascular: Negative for chest pain and leg swelling.  Gastrointestinal: Positive for nausea.  Neurological: Positive for headaches. Negative for dizziness and light-headedness.       Objective:   Physical Exam Physical Exam  Constitutional: Oriented to person, place, and time. She appears well-developed and well-nourished.    HENT:  Head: Normocephalic and atraumatic.  Eyes: Conjunctivae are normal.  Neck: Normal range of motion. Neck supple.  Cardiovascular: Normal rate, regular rhythm and normal heart sounds.   Pulmonary/Chest: Effort normal and breath sounds normal.  Musculoskeletal: Normal range of motion.  Neurological: Alert and oriented to person, place, and time.  Skin: Skin is warm and dry.  Psychiatric: Normal mood and affect. Behavior is normal. Judgment and thought content normal.  Vitals reviewed.  BP 131/81 mmHg  Pulse 60  Temp(Src) 98.3 F (36.8 C) (Oral)  Resp 16  Ht 5' 4.25" (1.632 m)  Wt 220 lb 9.6 oz (100.064 kg)  BMI 37.57 kg/m2  SpO2 99% Wt Readings from Last 3 Encounters:  09/05/15 220 lb 9.6 oz (100.064 kg)  06/25/15 224 lb (101.606 kg)  05/28/15 225 lb 6.4 oz (102.241 kg)   Results for orders placed or performed in visit on 09/05/15  POCT urinalysis dipstick  Result Value Ref Range   Color, UA yellow yellow   Clarity, UA clear clear   Glucose, UA negative negative   Bilirubin, UA small (A) negative   Ketones, POC UA trace (5) (A) negative   Spec Grav, UA 1.025    Blood, UA trace-intact (A) negative   pH, UA 5.5    Protein Ur, POC negative negative   Urobilinogen, UA 0.2    Nitrite, UA Negative Negative   Leukocytes, UA Trace (A) Negative  POCT Microscopic Urinalysis ([facility])  Result Value Ref Range   WBC,UR,HPF,POC Few (A) None WBC/hpf   RBC,UR,HPF,POC None None RBC/hpf   Bacteria None None, Too numerous to count   Mucus Absent Absent   Epithelial Cells, UR Per Microscopy Few (A) None, Too numerous to count cells/hpf   Calcium Oxalate Crystal moderate        Assessment & Plan:  1. Hematuria - Patient with microscopic hematuria at last visit, will recheck today - POCT urinalysis dipstick - POCT Microscopic Urinalysis ([facility]) - BASIC METABOLIC PANEL WITH GFR  2. Nausea without vomiting - ondansetron (ZOFRAN-ODT) 8 MG disintegrating tablet; Take 1 tablet (8  mg total) by mouth every 8 (eight) hours as needed for nausea.  Dispense: 20 tablet; Refill: 0  3. Migraine without status migrainosus, not intractable, unspecified migraine type - I have asked her to keep a headache log to try to determine triggers - cyclobenzaprine (FLEXERIL) 10 MG tablet; Take 1 tablet (10 mg total) by mouth 3 (three) times daily as needed for muscle spasms. Can take 1/2-1 tablet.  Dispense: 30 tablet; Refill: 0 - butalbital-acetaminophen-caffeine (FIORICET, ESGIC) 50-325-40 MG tablet; Take 1-2 tablets up to twice a day for migraine  Dispense: 14 tablet; Refill: 0  4. Essential hypertension - continue lisinopril and propranolol  Clarene Reamer, FNP-BC  [facility], [place] [facility]  09/05/2015 11:39 AM

## 2015-09-11 MED FILL — TOPIRAMATE 50 MG TABLET: 50 | 45 days supply | Qty: 90 | Fill #1

## 2015-09-11 MED FILL — FLUoxetine HCL 40 MG CAPS: 40 | 90 days supply | Qty: 90 | Fill #1

## 2015-09-11 MED FILL — DEXILANT DR 60 MG CAPSULE: 60 | 90 days supply | Qty: 90 | Fill #1

## 2015-09-11 MED FILL — DARIFENACIN ER 15 MG TABLET: 15 | 90 days supply | Qty: 90 | Fill #1

## 2015-09-11 MED FILL — PROPRANOLOL ER 120 MG CAP: 120 | 90 days supply | Qty: 90 | Fill #1

## 2015-09-11 MED FILL — BUPROPION HCL XL 300 MG TAB: 300 | 90 days supply | Qty: 90 | Fill #1

## 2015-09-11 MED FILL — LISINOPRIL 20 MG TABLET: 20 | 90 days supply | Qty: 90 | Fill #1

## 2015-09-24 ENCOUNTER — Ambulatory Visit: Payer: 59 | Admitting: Family Medicine

## 2015-09-24 MED FILL — GABAPENTIN 300 MG CAPSULE: 300 | 30 days supply | Qty: 90 | Fill #2

## 2015-11-14 ENCOUNTER — Encounter: Payer: Self-pay | Admitting: Family Medicine

## 2015-11-15 ENCOUNTER — Other Ambulatory Visit: Payer: Self-pay | Admitting: Family Medicine

## 2015-11-15 DIAGNOSIS — G894 Chronic pain syndrome: Secondary | ICD-10-CM

## 2015-11-15 DIAGNOSIS — G43909 Migraine, unspecified, not intractable, without status migrainosus: Secondary | ICD-10-CM

## 2015-11-15 DIAGNOSIS — N189 Chronic kidney disease, unspecified: Secondary | ICD-10-CM

## 2015-11-15 MED ORDER — OXYCODONE-ACETAMINOPHEN 5-325 MG PO TABS: 1.0000 | ORAL_TABLET | Freq: Three times a day (TID) | ORAL | Status: DC | PRN

## 2015-11-15 MED ORDER — BUTALBITAL-APAP-CAFFEINE 50-325-40 MG PO TABS: ORAL_TABLET | ORAL | Status: DC

## 2015-11-15 MED FILL — ALPRAZolam 0.5 MG TABS: 0.5 | 30 days supply | Qty: 30 | Fill #2

## 2015-11-15 MED FILL — BUTALBITAL/APAP/CAFFEINE TB: 50-325-40 | 7 days supply | Qty: 14 | Fill #0

## 2015-11-15 MED FILL — OXYCODONE/APAP 5-325: 5-325 | 20 days supply | Qty: 60 | Fill #0

## 2015-11-19 MED FILL — TOPIRAMATE 50 MG TABLET: 50 | 45 days supply | Qty: 90 | Fill #0

## 2015-11-19 NOTE — Telephone Encounter (Signed)
Did you want to give pt refills to cont taking?

## 2015-11-20 MED FILL — CYCLOBENZAPRINE 10 MG TAB: 10 | 60 days supply | Qty: 180 | Fill #0

## 2015-11-21 ENCOUNTER — Ambulatory Visit
Admission: RE | Admit: 2015-11-21 | Discharge: 2015-11-21 | Disposition: A | Discharge status: Home / Self Care | Payer: 59 | Source: Ambulatory Visit | Attending: Family Medicine | Admitting: Family Medicine

## 2015-11-21 DIAGNOSIS — N189 Chronic kidney disease, unspecified: Secondary | ICD-10-CM

## 2015-11-21 DIAGNOSIS — N183 Chronic kidney disease, stage 3 (moderate): Secondary | ICD-10-CM | POA: Diagnosis not present

## 2015-11-23 DIAGNOSIS — H524 Presbyopia: Secondary | ICD-10-CM | POA: Diagnosis not present

## 2015-11-23 MED FILL — GABAPENTIN 300 MG CAPSULE: 300 | 30 days supply | Qty: 90 | Fill #3

## 2015-12-26 ENCOUNTER — Other Ambulatory Visit: Payer: Self-pay | Admitting: Family Medicine

## 2015-12-28 MED FILL — PROPRANOLOL ER 120 MG CAP: 120 | 90 days supply | Qty: 90 | Fill #0

## 2015-12-28 MED FILL — LISINOPRIL 20 MG TABLET: 20 | 90 days supply | Qty: 90 | Fill #0

## 2015-12-28 NOTE — Telephone Encounter (Signed)
[name] can we refill she was seen in February, but I don't see any mention.

## 2016-01-01 MED FILL — FLUoxetine HCL 40 MG CAPS: 40 | 90 days supply | Qty: 90 | Fill #0

## 2016-01-01 MED FILL — BUPROPION HCL XL 300 MG TAB: 300 | 90 days supply | Qty: 90 | Fill #0

## 2016-01-01 MED FILL — DARIFENACIN ER 15 MG TABLET: 15 | 90 days supply | Qty: 90 | Fill #0

## 2016-01-01 MED FILL — DEXILANT DR 60 MG CAPSULE: 60 | 90 days supply | Qty: 90 | Fill #0

## 2016-01-21 ENCOUNTER — Other Ambulatory Visit: Payer: Self-pay | Admitting: Family Medicine

## 2016-01-22 MED FILL — GABAPENTIN 300 MG CAPSULE: 300 | 90 days supply | Qty: 270 | Fill #0

## 2016-03-05 ENCOUNTER — Ambulatory Visit: Payer: 59 | Admitting: Physician Assistant

## 2016-03-12 ENCOUNTER — Ambulatory Visit: Payer: 59 | Admitting: Family Medicine

## 2016-04-01 MED FILL — BUPROPION HCL XL 300 MG TAB: 300 | 90 days supply | Qty: 90 | Fill #1

## 2016-04-01 MED FILL — DARIFENACIN ER 15 MG TABLET: 15 | 90 days supply | Qty: 90 | Fill #1

## 2016-04-01 MED FILL — FLUoxetine HCL 40 MG CAPS: 40 | 90 days supply | Qty: 90 | Fill #1

## 2016-04-01 MED FILL — LISINOPRIL 20 MG TABLET: 20 | 90 days supply | Qty: 90 | Fill #1

## 2016-04-01 MED FILL — DEXILANT DR 60 MG CAPSULE: 60 | 90 days supply | Qty: 90 | Fill #1

## 2016-04-01 MED FILL — PROPRANOLOL ER 120 MG CAP: 120 | 90 days supply | Qty: 90 | Fill #1

## 2016-04-22 ENCOUNTER — Ambulatory Visit (INDEPENDENT_AMBULATORY_CARE_PROVIDER_SITE_OTHER): Payer: 59 | Admitting: Family Medicine

## 2016-04-22 VITALS — BP 128/94 | HR 65 | Temp 98.0°F | Resp 17 | Ht 64.25 in | Wt 229.0 lb

## 2016-04-22 DIAGNOSIS — G8929 Other chronic pain: Secondary | ICD-10-CM

## 2016-04-22 DIAGNOSIS — G894 Chronic pain syndrome: Secondary | ICD-10-CM | POA: Diagnosis not present

## 2016-04-22 DIAGNOSIS — R319 Hematuria, unspecified: Secondary | ICD-10-CM | POA: Diagnosis not present

## 2016-04-22 DIAGNOSIS — G43909 Migraine, unspecified, not intractable, without status migrainosus: Secondary | ICD-10-CM

## 2016-04-22 DIAGNOSIS — R112 Nausea with vomiting, unspecified: Secondary | ICD-10-CM | POA: Diagnosis not present

## 2016-04-22 DIAGNOSIS — N189 Chronic kidney disease, unspecified: Secondary | ICD-10-CM

## 2016-04-22 DIAGNOSIS — G4700 Insomnia, unspecified: Secondary | ICD-10-CM

## 2016-04-22 DIAGNOSIS — F4323 Adjustment disorder with mixed anxiety and depressed mood: Secondary | ICD-10-CM

## 2016-04-22 LAB — POC MICROSCOPIC URINALYSIS (UMFC): MUCUS RE: ABSENT

## 2016-04-22 LAB — POCT URINALYSIS DIP (MANUAL ENTRY)
BILIRUBIN UA: NEGATIVE
BILIRUBIN UA: NEGATIVE
Blood, UA: NEGATIVE
GLUCOSE UA: NEGATIVE
LEUKOCYTES UA: NEGATIVE
NITRITE UA: NEGATIVE
Protein Ur, POC: NEGATIVE
Spec Grav, UA: 1.015
Urobilinogen, UA: 0.2
pH, UA: 7

## 2016-04-22 LAB — POCT CBC
Granulocyte percent: 67.5 %G (ref 37–80)
HCT, POC: 37.9 % (ref 37.7–47.9)
Hemoglobin: 13.2 g/dL (ref 12.2–16.2)
LYMPH, POC: 1.5 (ref 0.6–3.4)
MCH, POC: 30.8 pg (ref 27–31.2)
MCHC: 34.8 g/dL (ref 31.8–35.4)
MCV: 88.4 fL (ref 80–97)
MID (CBC): 0.3 (ref 0–0.9)
MPV: 7.8 fL (ref 0–99.8)
PLATELET COUNT, POC: 212 10*3/uL (ref 142–424)
POC Granulocyte: 3.6 (ref 2–6.9)
POC LYMPH %: 26.9 % (ref 10–50)
POC MID %: 5.6 % (ref 0–12)
RBC: 4.29 M/uL (ref 4.04–5.48)
RDW, POC: 12.6 %
WBC: 5.4 10*3/uL (ref 4.6–10.2)

## 2016-04-22 MED ORDER — ONDANSETRON 4 MG PO TBDP: 4.0000 mg | ORAL_TABLET | Freq: Three times a day (TID) | ORAL | 0 refills | Status: DC | PRN

## 2016-04-22 MED ORDER — BUTALBITAL-APAP-CAFFEINE 50-325-40 MG PO TABS: ORAL_TABLET | ORAL | 0 refills | Status: DC

## 2016-04-22 MED ORDER — HYDROXYZINE PAMOATE 25 MG PO CAPS: 25.0000 mg | ORAL_CAPSULE | Freq: Every evening | ORAL | 0 refills | Status: DC | PRN

## 2016-04-22 MED ORDER — OXYCODONE-ACETAMINOPHEN 5-325 MG PO TABS: 1.0000 | ORAL_TABLET | Freq: Three times a day (TID) | ORAL | 0 refills | Status: DC | PRN

## 2016-04-22 NOTE — Patient Instructions (Addendum)
Wean off of alprazolam as this medicine interacts with oxycodone. I did write for some Vistaril at bedtime if needed to help getting to sleep. Meeting with a therapist or counselor may also help with anxiety symptoms to lessen need for medication at night.  Follow-up with new primary care provider to discuss migraine treatments further. I did refill your Fioricet for now.  I did refill the oxycodone for now that should last for this next 1 month. Follow up with new primary provider to discuss either pain medicine contract here, or referral to pain management.  I referred you to gastroenterology, but will look into other causes of your nausea and vomiting. Zofran if needed for nausea or vomiting, return here or emergency room if any acute worsening of your symptoms.  The urine test looks normal today, and without blood. If you notice blood in your urine again, return right away to have this rechecked or to look into other causes.  Nausea and Vomiting Nausea is a sick feeling that often comes before throwing up (vomiting). Vomiting is a reflex where stomach contents come out of your mouth. Vomiting can cause severe loss of body fluids (dehydration). Children and elderly adults can become dehydrated quickly, especially if they also have diarrhea. Nausea and vomiting are symptoms of a condition or disease. It is important to find the cause of your symptoms. CAUSES   Direct irritation of the stomach lining. This irritation can result from increased acid production (gastroesophageal reflux disease), infection, food poisoning, taking certain medicines (such as nonsteroidal anti-inflammatory drugs), alcohol use, or tobacco use.  Signals from the brain.These signals could be caused by a headache, heat exposure, an inner ear disturbance, increased pressure in the brain from injury, infection, a tumor, or a concussion, pain, emotional stimulus, or metabolic problems.  An obstruction in the gastrointestinal  tract (bowel obstruction).  Illnesses such as diabetes, hepatitis, gallbladder problems, appendicitis, kidney problems, cancer, sepsis, atypical symptoms of a heart attack, or eating disorders.  Medical treatments such as chemotherapy and radiation.  Receiving medicine that makes you sleep (general anesthetic) during surgery. DIAGNOSIS Your caregiver may ask for tests to be done if the problems do not improve after a few days. Tests may also be done if symptoms are severe or if the reason for the nausea and vomiting is not clear. Tests may include:  Urine tests.  Blood tests.  Stool tests.  Cultures (to look for evidence of infection).  X-rays or other imaging studies. Test results can help your caregiver make decisions about treatment or the need for additional tests. TREATMENT You need to stay well hydrated. Drink frequently but in small amounts.You may wish to drink water, sports drinks, clear broth, or eat frozen ice pops or gelatin dessert to help stay hydrated.When you eat, eating slowly may help prevent nausea.There are also some antinausea medicines that may help prevent nausea. HOME CARE INSTRUCTIONS   Take all medicine as directed by your caregiver.  If you do not have an appetite, do not force yourself to eat. However, you must continue to drink fluids.  If you have an appetite, eat a normal diet unless your caregiver tells you differently.  Eat a variety of complex carbohydrates (rice, wheat, potatoes, bread), lean meats, yogurt, fruits, and vegetables.  Avoid high-fat foods because they are more difficult to digest.  Drink enough water and fluids to keep your urine clear or pale yellow.  If you are dehydrated, ask your caregiver for specific rehydration instructions. Signs of  dehydration may include:  Severe thirst.  Dry lips and mouth.  Dizziness.  Dark urine.  Decreasing urine frequency and amount.  Confusion.  Rapid breathing or pulse. SEEK  IMMEDIATE MEDICAL CARE IF:   You have blood or brown flecks (like coffee grounds) in your vomit.  You have black or bloody stools.  You have a severe headache or stiff neck.  You are confused.  You have severe abdominal pain.  You have chest pain or trouble breathing.  You do not urinate at least once every 8 hours.  You develop cold or clammy skin.  You continue to vomit for longer than 24 to 48 hours.  You have a fever. MAKE SURE YOU:   Understand these instructions.  Will watch your condition.  Will get help right away if you are not doing well or get worse.   This information is not intended to replace advice given to you by your health care provider. Make sure you discuss any questions you have with your health care provider.   Document Released: 07/21/2005 Document Revised: 10/13/2011 Document Reviewed: 12/18/2010 Elsevier Interactive Patient Education 2016 [employer]   Abdominal Pain, Adult Many things can cause abdominal pain. Usually, abdominal pain is not caused by a disease and will improve without treatment. It can often be observed and treated at home. Your health care provider will do a physical exam and possibly order blood tests and X-rays to help determine the seriousness of your pain. However, in many cases, more time must pass before a clear cause of the pain can be found. Before that point, your health care provider may not know if you need more testing or further treatment. HOME CARE INSTRUCTIONS Monitor your abdominal pain for any changes. The following actions may help to alleviate any discomfort you are experiencing:  Only take over-the-counter or prescription medicines as directed by your health care provider.  Do not take laxatives unless directed to do so by your health care provider.  Try a clear liquid diet (broth, tea, or water) as directed by your health care provider. Slowly move to a bland diet as tolerated. SEEK MEDICAL CARE  IF:  You have unexplained abdominal pain.  You have abdominal pain associated with nausea or diarrhea.  You have pain when you urinate or have a bowel movement.  You experience abdominal pain that wakes you in the night.  You have abdominal pain that is worsened or improved by eating food.  You have abdominal pain that is worsened with eating fatty foods.  You have a fever. SEEK IMMEDIATE MEDICAL CARE IF:  Your pain does not go away within 2 hours.  You keep throwing up (vomiting).  Your pain is felt only in portions of the abdomen, such as the right side or the left lower portion of the abdomen.  You pass bloody or black tarry stools. MAKE SURE YOU:  Understand these instructions.  Will watch your condition.  Will get help right away if you are not doing well or get worse.   This information is not intended to replace advice given to you by your health care provider. Make sure you discuss any questions you have with your health care provider.   Document Released: 04/30/2005 Document Revised: 04/11/2015 Document Reviewed: 03/30/2013 Elsevier Interactive Patient Education 2016 [employer].    IF you received an x-ray today, you will receive an invoice from [facility]. Please contact [facility] at [phone] with questions or concerns regarding your invoice.  IF you received labwork today, you will receive an invoice from [employer]. Please contact [facility] at [phone] with questions or concerns regarding your invoice.   Our billing staff will not be able to assist you with questions regarding bills from these companies.  You will be contacted with the lab results as soon as they are available. The fastest way to get your results is to activate your My Chart account. Instructions are located on the last page of this paperwork. If you have not heard from [place] regarding the results in 2 weeks, please contact this office.

## 2016-04-22 NOTE — Progress Notes (Signed)
By signing my name below I, Tereasa Coop, attest that this documentation has been prepared under the direction and in the presence of Wendie Agreste, MD. Electonically Signed. Tereasa Coop, Scribe 04/22/2016 at 6:31 PM   Subjective:    Patient ID: [name], female    DOB: 19-Mar-1956, 60 y.o.   MRN: [id number]  Chief Complaint  Patient presents with  . Medication Refill    Butalbital, Oxycodone, Alprazolam  . Gastroesophageal Reflux    Onset 4-5 weeks    HPI [name] is a 60 y.o. female who presents to the [facility] complaining of gastroesophageal reflux for the past 5 weeks. Pt has been taking 60mg  dexilant QD. Pt states that reflux is worsening and she has started waking up vomiting. Pt also reports that she has been starting to vomit up reflux in the evenings. Pt has been also feeling nauseous 2-3 times per week. Pt states symptoms are exacerbated by laying down. Pt has had an endoscopy 6 years ago when she had a bleeding ulcer. Pt denies drinking any alcohol. Pt states that she has not eaten any food or taken any of her medications today due to the nausea.   Pt reports history of mild stress incontinence and states she has occasionally seen mild blood spots on her pads. Pt denies vaginal bleeding, dysuria, urinary frequency, or urinary urgency. Pt also reports history of CKD.  Lab Results  Component Value Date   CREATININE 1.08 (H) 09/05/2015    Most recently seen in Nov 2016 for a physical. Pt called in for prescriptions for Fioricet and percocet in April 2017.  Migraine HAs Based on discussion of HAs in 2016 had good control with Topamax with 1-2 HAs per month. Has taken fioricet for migraines, last prescribed 14 fioricet in April 2017. Pt states that she has run out of her fioricet because she has been having migraines once a month since April and pt has been taking 2 fioricet at a time.   Arm pain History of shoulder surgery, followed by hand  specialist with work related injury a few years ago. Pt was on percocet BID based on October 2016 office visit. In February 2017 pt was still taking 1-2 percocet daily for pain in rt arm. Pt last prescribed 60 percocet in April 2016. Pt also takes Neurontin 300mg  in the morning and 600mg  at bedtime. Pt reports that she tries to only take one percocet a day. Within the past week, pt states she has been taking 2 a day due to increase in arm pain.   Pt has history of depression, anxiety, and fibromyalgia by problem list. Pt takes alprazolam at bedtime as needed for anxiety. Pt states she takes half a pill at bed time every night. Pt reports that she has been taking the alprazolam every night lately due increased stress because pt's daughter has been in some trouble.   Pt has sleep apnea and uses a CPAP machine at night.  Previous pt of Tor Netters.   PCP is listed as Roni Bread, MD    Patient Active Problem List   Diagnosis Date Noted  . Chronic kidney disease 06/01/2015  . Esophageal reflux 06/01/2015  . Goiter 06/01/2015  . Essential hypertension 06/01/2015  . Hx of migraine headaches 06/01/2015  . Chronic pain syndrome 05/28/2015  . Fibromyalgia 05/28/2015  . OSA (obstructive sleep apnea) 05/28/2015  . Adjustment disorder with mixed anxiety and depressed mood 05/28/2015   Past Medical History:  Diagnosis Date  . Allergy   . Anemia   . Anxiety   . Chronic kidney disease   . Depression   . Fibromyalgia   . GERD (gastroesophageal reflux disease)   . Hypertension   . Migraine   . Osteoporosis   . Thyroid disease    Past Surgical History:  Procedure Laterality Date  . ABDOMINAL HYSTERECTOMY    . ROTATOR CUFF REPAIR     Allergies  Allergen Reactions  . Sulfa Antibiotics    Prior to Admission medications   Medication Sig Start Date End Date Taking? Authorizing Provider  ALPRAZolam Duanne Moron) 0.5 MG tablet Take 1 tablet (0.5 mg total) by mouth at bedtime as needed for  anxiety. 06/25/15  Yes Elby Beck, FNP  buPROPion (WELLBUTRIN XL) 300 MG 24 hr tablet TAKE 1 TABLET BY MOUTH DAILY. 12/29/15  Yes Elby Beck, FNP  butalbital-acetaminophen-caffeine (FIORICET, ESGIC) [id number] MG tablet Take 1-2 tablets up to twice a day for migraine 11/15/15  Yes Elby Beck, FNP  cyclobenzaprine (FLEXERIL) 10 MG tablet TAKE 1/2 TO 1 TABLET BY MOUTH 3 TIMES DAILY AS NEEDED FOR MUSCLE SPASMS 11/19/15  Yes Elby Beck, FNP  darifenacin (ENABLEX) 15 MG 24 hr tablet TAKE 1 TABLET BY MOUTH DAILY 12/29/15  Yes Elby Beck, FNP  DEXILANT 60 MG capsule TAKE 1 CAPSULE BY MOUTH DAILY. 12/29/15  Yes Elby Beck, FNP  FLUoxetine (PROZAC) 40 MG capsule TAKE 1 CAPSULE BY MOUTH DAILY. 12/29/15  Yes Elby Beck, FNP  gabapentin (NEURONTIN) 300 MG capsule TAKE 1 CAPSULE BY MOUTH IN THE MORNING AND 2 CAPSULES AT BEDTIME 01/22/16  Yes Elby Beck, FNP  lisinopril (PRINIVIL,ZESTRIL) 20 MG tablet TAKE 1 TABLET BY MOUTH DAILY. 12/28/15  Yes Elby Beck, FNP  ondansetron (ZOFRAN-ODT) 8 MG disintegrating tablet Take 1 tablet (8 mg total) by mouth every 8 (eight) hours as needed for nausea. 09/05/15  Yes Elby Beck, FNP  oxyCODONE-acetaminophen (PERCOCET/ROXICET) 5-325 MG tablet Take 1 tablet by mouth every 8 (eight) hours as needed for moderate pain or severe pain. 11/15/15  Yes Elby Beck, FNP  propranolol ER (INDERAL LA) 120 MG 24 hr capsule TAKE 1 CAPSULE BY MOUTH DAILY. 12/28/15  Yes Elby Beck, FNP  topiramate (TOPAMAX) 50 MG tablet TAKE 1-2 TABLETS BY MOUTH NIGHTLY FOR HEADACHE PREVENTION 11/17/15  Yes Elby Beck, FNP   Social History   Social History  . Marital status: Married    Spouse name: N/A  . Number of children: N/A  . Years of education: N/A   Occupational History  . Not on file.   Social History Main Topics  . Smoking status: Never Smoker  . Smokeless tobacco: Not on file  . Alcohol use No  . Drug use:  Unknown  . Sexual activity: No   Other Topics Concern  . Not on file   Social History Narrative  . No narrative on file      Review of Systems  Constitutional: Negative for fatigue and unexpected weight change.  Respiratory: Negative for chest tightness and shortness of breath.   Cardiovascular: Negative for chest pain, palpitations and leg swelling.  Gastrointestinal: Positive for nausea and vomiting. Negative for abdominal pain and blood in stool.  Genitourinary: Positive for hematuria. Negative for difficulty urinating, dysuria and frequency.  Neurological: Negative for dizziness, syncope, light-headedness and headaches.       Objective:   Physical Exam  Constitutional: She is oriented to person,  place, and time. She appears well-developed and well-nourished.  HENT:  Head: Normocephalic and atraumatic.  Eyes: Conjunctivae and EOM are normal. Pupils are equal, round, and reactive to light.  Neck: Carotid bruit is not present.  Cardiovascular: Normal rate, regular rhythm, normal heart sounds and intact distal pulses.  Exam reveals no gallop and no friction rub.   No murmur heard. Pulmonary/Chest: Effort normal and breath sounds normal. No accessory muscle usage. She has no decreased breath sounds. She has no wheezes. She has no rhonchi. She has no rales.  Abdominal: Soft. She exhibits no pulsatile midline mass. There is tenderness (minimal) in the epigastric area and suprapubic area. There is no rebound, no guarding, no CVA tenderness, no tenderness at McBurney's point and negative Murphy's sign.  Neurological: She is alert and oriented to person, place, and time.  Skin: Skin is warm and dry.  Psychiatric: She has a normal mood and affect. Her behavior is normal.  Vitals reviewed.   Vitals:   04/22/16 1619  BP: (!) 128/94  Pulse: 65  Resp: 17  Temp: 98 F (36.7 C)  TempSrc: Oral  SpO2: 96%  Weight: 229 lb (103.9 kg)  Height: 5' 4.25" (1.632 m)   Results for orders  placed or performed in visit on 04/22/16  POCT CBC  Result Value Ref Range   WBC 5.4 4.6 - 10.2 K/uL   Lymph, poc 1.5 0.6 - 3.4   POC LYMPH PERCENT 26.9 10 - 50 %L   MID (cbc) 0.3 0 - 0.9   POC MID % 5.6 0 - 12 %M   POC Granulocyte 3.6 2 - 6.9   Granulocyte percent 67.5 37 - 80 %G   RBC 4.29 4.04 - 5.48 M/uL   Hemoglobin 13.2 12.2 - 16.2 g/dL   HCT, POC 37.9 37.7 - 47.9 %   MCV 88.4 80 - 97 fL   MCH, POC 30.8 27 - 31.2 pg   MCHC 34.8 31.8 - 35.4 g/dL   RDW, POC 12.6 %   Platelet Count, POC 212 142 - 424 K/uL   MPV 7.8 0 - 99.8 fL  POCT Microscopic Urinalysis ([facility])  Result Value Ref Range   WBC,UR,HPF,POC None None WBC/hpf   RBC,UR,HPF,POC None None RBC/hpf   Bacteria Few (A) None, Too numerous to count   Mucus Absent Absent   Epithelial Cells, UR Per Microscopy None None, Too numerous to count cells/hpf  POCT urinalysis dipstick  Result Value Ref Range   Color, UA yellow yellow   Clarity, UA clear clear   Glucose, UA negative negative   Bilirubin, UA negative negative   Ketones, POC UA negative negative   Spec Grav, UA 1.015    Blood, UA negative negative   pH, UA 7.0    Protein Ur, POC negative negative   Urobilinogen, UA 0.2    Nitrite, UA Negative Negative   Leukocytes, UA Negative Negative     Over 40 minutes spent with pt for coordination of care, discussion of plan and interpretation of labs. Prior notes also reviewed.      Assessment & Plan:    David Kissinger is a 60 y.o. female Non-intractable vomiting with nausea, vomiting of unspecified type - Plan: Lipase, COMPLETE METABOLIC PANEL WITH GFR, Ambulatory referral to Gastroenterology, POCT CBC, ondansetron (ZOFRAN ODT) 4 MG disintegrating tablet  - reassuring CBC and in office exam. Check lipase, CMP, referred to GI, and prescription for Zofran given. RTC/ER precautions if acute worsening.  CKD (chronic kidney disease), unspecified  stage  - CMP pending  Hematuria - Plan: POCT CBC, POCT Microscopic  Urinalysis ([facility]), POCT urinalysis dipstick  - Hematuria by history, but no sign of blood in urine in office. RTC precautions if symptoms recur  Migraine without status migrainosus, not intractable, unspecified migraine type - Plan: butalbital-acetaminophen-caffeine (FIORICET, ESGIC) 50-325-40 MG tablet  - Overall stable with one episode per month, continue Topamax, fioricet temporarily refilled, but discussed need to follow-up with new PCP to discuss plan and whether or not migraine specialist follow-up needed. Advised to not combine Fioricet with oxycodone.  Insomnia - Plan: hydrOXYzine (VISTARIL) 25 MG capsuleAdjustment disorder with mixed anxiety and depressed mood - Plan: hydrOXYzine (VISTARIL) 25 MG capsule  Advised to wean off of alprazolam due to possible risks of combination of this and her oxycodone. This drill prescription given with side effects discussed, including sedation risk with this medication. Can discuss regimen further at follow-up with new primary care provider.  Chronic pain syndrome - Plan: oxyCODONE-acetaminophen (PERCOCET/ROXICET) 5-325 MG tablet Chronic pain - Plan: oxyCODONE-acetaminophen (PERCOCET/ROXICET) 5-325 MG tablet  - Agreed on temporary refill of oxycodone for now until she can follow up with new primary care provider to determine if chronic pain medication contract would be performed if followed here or referral to pain management specialist.  Meds ordered this encounter  Medications  . butalbital-acetaminophen-caffeine (FIORICET, ESGIC) 50-325-40 MG tablet    Sig: Take 1-2 tablets up to twice a day for migraine    Dispense:  14 tablet    Refill:  0  . hydrOXYzine (VISTARIL) 25 MG capsule    Sig: Take 1 capsule (25 mg total) by mouth at bedtime as needed.    Dispense:  30 capsule    Refill:  0  . oxyCODONE-acetaminophen (PERCOCET/ROXICET) 5-325 MG tablet    Sig: Take 1 tablet by mouth every 8 (eight) hours as needed for moderate pain or severe pain.     Dispense:  30 tablet    Refill:  0  . ondansetron (ZOFRAN ODT) 4 MG disintegrating tablet    Sig: Take 1 tablet (4 mg total) by mouth every 8 (eight) hours as needed for nausea or vomiting.    Dispense:  10 tablet    Refill:  0   Patient Instructions    Wean off of alprazolam as this medicine interacts with oxycodone. I did write for some Vistaril at bedtime if needed to help getting to sleep. Meeting with a therapist or counselor may also help with anxiety symptoms to lessen need for medication at night.  Follow-up with new primary care provider to discuss migraine treatments further. I did refill your Fioricet for now.  I did refill the oxycodone for now that should last for this next 1 month. Follow up with new primary provider to discuss either pain medicine contract here, or referral to pain management.  I referred you to gastroenterology, but will look into other causes of your nausea and vomiting. Zofran if needed for nausea or vomiting, return here or emergency room if any acute worsening of your symptoms.  The urine test looks normal today, and without blood. If you notice blood in your urine again, return right away to have this rechecked or to look into other causes.  Nausea and Vomiting Nausea is a sick feeling that often comes before throwing up (vomiting). Vomiting is a reflex where stomach contents come out of your mouth. Vomiting can cause severe loss of body fluids (dehydration). Children and elderly adults can become dehydrated  quickly, especially if they also have diarrhea. Nausea and vomiting are symptoms of a condition or disease. It is important to find the cause of your symptoms. CAUSES   Direct irritation of the stomach lining. This irritation can result from increased acid production (gastroesophageal reflux disease), infection, food poisoning, taking certain medicines (such as nonsteroidal anti-inflammatory drugs), alcohol use, or tobacco use.  Signals from the  brain.These signals could be caused by a headache, heat exposure, an inner ear disturbance, increased pressure in the brain from injury, infection, a tumor, or a concussion, pain, emotional stimulus, or metabolic problems.  An obstruction in the gastrointestinal tract (bowel obstruction).  Illnesses such as diabetes, hepatitis, gallbladder problems, appendicitis, kidney problems, cancer, sepsis, atypical symptoms of a heart attack, or eating disorders.  Medical treatments such as chemotherapy and radiation.  Receiving medicine that makes you sleep (general anesthetic) during surgery. DIAGNOSIS Your caregiver may ask for tests to be done if the problems do not improve after a few days. Tests may also be done if symptoms are severe or if the reason for the nausea and vomiting is not clear. Tests may include:  Urine tests.  Blood tests.  Stool tests.  Cultures (to look for evidence of infection).  X-rays or other imaging studies. Test results can help your caregiver make decisions about treatment or the need for additional tests. TREATMENT You need to stay well hydrated. Drink frequently but in small amounts.You may wish to drink water, sports drinks, clear broth, or eat frozen ice pops or gelatin dessert to help stay hydrated.When you eat, eating slowly may help prevent nausea.There are also some antinausea medicines that may help prevent nausea. HOME CARE INSTRUCTIONS   Take all medicine as directed by your caregiver.  If you do not have an appetite, do not force yourself to eat. However, you must continue to drink fluids.  If you have an appetite, eat a normal diet unless your caregiver tells you differently.  Eat a variety of complex carbohydrates (rice, wheat, potatoes, bread), lean meats, yogurt, fruits, and vegetables.  Avoid high-fat foods because they are more difficult to digest.  Drink enough water and fluids to keep your urine clear or pale yellow.  If you are  dehydrated, ask your caregiver for specific rehydration instructions. Signs of dehydration may include:  Severe thirst.  Dry lips and mouth.  Dizziness.  Dark urine.  Decreasing urine frequency and amount.  Confusion.  Rapid breathing or pulse. SEEK IMMEDIATE MEDICAL CARE IF:   You have blood or brown flecks (like coffee grounds) in your vomit.  You have black or bloody stools.  You have a severe headache or stiff neck.  You are confused.  You have severe abdominal pain.  You have chest pain or trouble breathing.  You do not urinate at least once every 8 hours.  You develop cold or clammy skin.  You continue to vomit for longer than 24 to 48 hours.  You have a fever. MAKE SURE YOU:   Understand these instructions.  Will watch your condition.  Will get help right away if you are not doing well or get worse.   This information is not intended to replace advice given to you by your health care provider. Make sure you discuss any questions you have with your health care provider.   Document Released: 07/21/2005 Document Revised: 10/13/2011 Document Reviewed: 12/18/2010 Elsevier Interactive Patient Education 2016 Elsevier Inc[employer]   Abdominal Pain, Adult Many things can cause abdominal pain. Usually, abdominal  pain is not caused by a disease and will improve without treatment. It can often be observed and treated at home. Your health care provider will do a physical exam and possibly order blood tests and X-rays to help determine the seriousness of your pain. However, in many cases, more time must pass before a clear cause of the pain can be found. Before that point, your health care provider may not know if you need more testing or further treatment. HOME CARE INSTRUCTIONS Monitor your abdominal pain for any changes. The following actions may help to alleviate any discomfort you are experiencing:  Only take over-the-counter or prescription medicines as directed by your  health care provider.  Do not take laxatives unless directed to do so by your health care provider.  Try a clear liquid diet (broth, tea, or water) as directed by your health care provider. Slowly move to a bland diet as tolerated. SEEK MEDICAL CARE IF:  You have unexplained abdominal pain.  You have abdominal pain associated with nausea or diarrhea.  You have pain when you urinate or have a bowel movement.  You experience abdominal pain that wakes you in the night.  You have abdominal pain that is worsened or improved by eating food.  You have abdominal pain that is worsened with eating fatty foods.  You have a fever. SEEK IMMEDIATE MEDICAL CARE IF:  Your pain does not go away within 2 hours.  You keep throwing up (vomiting).  Your pain is felt only in portions of the abdomen, such as the right side or the left lower portion of the abdomen.  You pass bloody or black tarry stools. MAKE SURE YOU:  Understand these instructions.  Will watch your condition.  Will get help right away if you are not doing well or get worse.   This information is not intended to replace advice given to you by your health care provider. Make sure you discuss any questions you have with your health care provider.   Document Released: 04/30/2005 Document Revised: 04/11/2015 Document Reviewed: 03/30/2013 Elsevier Interactive Patient Education 2016 [employer].    IF you received an x-ray today, you will receive an invoice from [facility]. Please contact [facility] at [phone] with questions or concerns regarding your invoice.   IF you received labwork today, you will receive an invoice from [employer]. Please contact [facility] at [phone] with questions or concerns regarding your invoice.   Our billing staff will not be able to assist you with questions regarding bills from these companies.  You will be contacted with the lab results as  soon as they are available. The fastest way to get your results is to activate your My Chart account. Instructions are located on the last page of this paperwork. If you have not heard from [place] regarding the results in 2 weeks, please contact this office.       I personally performed the services described in this documentation, which was scribed in my presence. The recorded information has been reviewed and considered, and addended by me as needed.   Signed,   Merri Ray, MD [facility].  04/24/16 7:06 PM

## 2016-04-23 LAB — COMPLETE METABOLIC PANEL WITH GFR
ALBUMIN: 4.2 g/dL (ref 3.6–5.1)
ALK PHOS: 68 U/L (ref 33–130)
ALT: 11 U/L (ref 6–29)
AST: 18 U/L (ref 10–35)
BILIRUBIN TOTAL: 0.3 mg/dL (ref 0.2–1.2)
BUN: 9 mg/dL (ref 7–25)
CALCIUM: 9.4 mg/dL (ref 8.6–10.4)
CO2: 29 mmol/L (ref 20–31)
CREATININE: 1.01 mg/dL — AB (ref 0.50–0.99)
Chloride: 105 mmol/L (ref 98–110)
GFR, Est African American: 70 mL/min (ref >=60)
GFR, Est Non African American: 61 mL/min (ref >=60)
GLUCOSE: 93 mg/dL (ref 65–99)
Potassium: 4.7 mmol/L (ref 3.5–5.3)
SODIUM: 141 mmol/L (ref 135–146)
TOTAL PROTEIN: 6.7 g/dL (ref 6.1–8.1)

## 2016-04-23 LAB — LIPASE: Lipase: 16 U/L (ref 7–60)

## 2016-04-23 MED FILL — BUTALBITAL/APAP/CAFFEINE TB: 50-325-40 | 4 days supply | Qty: 14 | Fill #0

## 2016-04-23 MED FILL — OXYCODONE/APAP 5-325: 5-325 | 10 days supply | Qty: 30 | Fill #0

## 2016-04-23 MED FILL — ONDANSETRON ODT 4 MG TABLET: 4 | 4 days supply | Qty: 10 | Fill #0

## 2016-04-23 MED FILL — HYDROXYZINE PAM 25 MG CAP: 25 | 30 days supply | Qty: 30 | Fill #0

## 2016-04-28 ENCOUNTER — Telehealth: Payer: Self-pay

## 2016-04-28 NOTE — Telephone Encounter (Signed)
Spoke with patient. Will be filling out ROI to be sent to [facility] regarding endoscopy results. Please let me know when these records arrive as she needs a referral ASAP! Thank you!

## 2016-05-21 ENCOUNTER — Encounter: Payer: Self-pay | Admitting: Family Medicine

## 2016-05-21 ENCOUNTER — Ambulatory Visit (INDEPENDENT_AMBULATORY_CARE_PROVIDER_SITE_OTHER): Payer: 59 | Admitting: Family Medicine

## 2016-05-21 ENCOUNTER — Telehealth: Payer: Self-pay

## 2016-05-21 VITALS — BP 122/80 | HR 65 | Temp 97.7°F | Resp 18 | Ht 64.25 in | Wt 230.2 lb

## 2016-05-21 DIAGNOSIS — Z23 Encounter for immunization: Secondary | ICD-10-CM | POA: Diagnosis not present

## 2016-05-21 DIAGNOSIS — M797 Fibromyalgia: Secondary | ICD-10-CM | POA: Diagnosis not present

## 2016-05-21 DIAGNOSIS — I10 Essential (primary) hypertension: Secondary | ICD-10-CM | POA: Diagnosis not present

## 2016-05-21 DIAGNOSIS — R112 Nausea with vomiting, unspecified: Secondary | ICD-10-CM | POA: Diagnosis not present

## 2016-05-21 DIAGNOSIS — G894 Chronic pain syndrome: Secondary | ICD-10-CM | POA: Diagnosis not present

## 2016-05-21 DIAGNOSIS — F4323 Adjustment disorder with mixed anxiety and depressed mood: Secondary | ICD-10-CM

## 2016-05-21 DIAGNOSIS — Z8669 Personal history of other diseases of the nervous system and sense organs: Secondary | ICD-10-CM

## 2016-05-21 DIAGNOSIS — K219 Gastro-esophageal reflux disease without esophagitis: Secondary | ICD-10-CM | POA: Diagnosis not present

## 2016-05-21 DIAGNOSIS — M8589 Other specified disorders of bone density and structure, multiple sites: Secondary | ICD-10-CM | POA: Diagnosis not present

## 2016-05-21 LAB — CBC WITH DIFFERENTIAL/PLATELET
BASOS ABS: 0 {cells}/uL (ref 0–200)
BASOS PCT: 0 %
EOS ABS: 108 {cells}/uL (ref 15–500)
Eosinophils Relative: 2 %
HEMATOCRIT: 38.5 % (ref 35.0–45.0)
Hemoglobin: 12.7 g/dL (ref 11.7–15.5)
Lymphocytes Relative: 26 %
Lymphs Abs: 1404 cells/uL (ref 850–3900)
MCH: 29.7 pg (ref 27.0–33.0)
MCHC: 33 g/dL (ref 32.0–36.0)
MCV: 90 fL (ref 80.0–100.0)
MONO ABS: 378 {cells}/uL (ref 200–950)
MONOS PCT: 7 %
MPV: 9.9 fL (ref 7.5–12.5)
Neutro Abs: 3510 cells/uL (ref 1500–7800)
Neutrophils Relative %: 65 %
PLATELETS: 221 10*3/uL (ref 140–400)
RBC: 4.28 MIL/uL (ref 3.80–5.10)
RDW: 13.3 % (ref 11.0–15.0)
WBC: 5.4 10*3/uL (ref 3.8–10.8)

## 2016-05-21 LAB — COMPREHENSIVE METABOLIC PANEL
ALBUMIN: 4.2 g/dL (ref 3.6–5.1)
ALK PHOS: 74 U/L (ref 33–130)
ALT: 10 U/L (ref 6–29)
AST: 18 U/L (ref 10–35)
BUN: 13 mg/dL (ref 7–25)
CALCIUM: 9.2 mg/dL (ref 8.6–10.4)
CHLORIDE: 109 mmol/L (ref 98–110)
CO2: 24 mmol/L (ref 20–31)
Creat: 1.01 mg/dL — ABNORMAL HIGH (ref 0.50–0.99)
Glucose, Bld: 87 mg/dL (ref 65–99)
POTASSIUM: 4.8 mmol/L (ref 3.5–5.3)
Sodium: 142 mmol/L (ref 135–146)
TOTAL PROTEIN: 6.9 g/dL (ref 6.1–8.1)
Total Bilirubin: 0.4 mg/dL (ref 0.2–1.2)

## 2016-05-21 LAB — TSH: TSH: 1.76 mIU/L

## 2016-05-21 LAB — LIPID PANEL
CHOL/HDL RATIO: 3.4 ratio (ref <=5.0)
CHOLESTEROL: 250 mg/dL — AB (ref 125–200)
HDL: 73 mg/dL (ref >=46)
LDL Cholesterol: 164 mg/dL — ABNORMAL HIGH (ref <130)
TRIGLYCERIDES: 65 mg/dL (ref <150)
VLDL: 13 mg/dL (ref <30)

## 2016-05-21 MED ORDER — RANITIDINE HCL 150 MG PO TABS: 150.0000 mg | ORAL_TABLET | Freq: Every day | ORAL | 5 refills | Status: DC

## 2016-05-21 MED FILL — raNITIdine HCL 150 MG TABS: 150 | 30 days supply | Qty: 30 | Fill #0

## 2016-05-21 NOTE — Progress Notes (Signed)
Subjective:    Patient ID: [name], female    DOB: Dec 16, 1955, 60 y.o.   MRN: [id number]  05/21/2016  Establish Care (Wants to establish care with Dr. Tamala Julian. Previously [employer] patient. )   HPI This 60 y.o. female presents to establish care and for follow-up of chronic medical conditions.  Has been followed at [facility] since 2016.  Previously followed at [facility]; fell in 2015; fell at work.    Last physical:  06-2015 Pap smear:  Hysterectomy; ovaries intact.  Uterine enlargement. Mammogram:07/17/2015 Colonoscopy:  6 years ago; Ferdinand Lango completed at [facility].  Bone density:  [facility]; due another. TDAP:  2016 Pneumovax:  none Zostavax: never Influenza:  today Eye exam:  11/2015 Dental exam:  Two years ago.    Anxiety and depression: Patient reports good compliance with medication, good tolerance to medication, and good symptom control.  Wellbutrin, Prozac maintaining well; maintained for years.  Hydroxyzine for insomnia.  Was taking Xanax PRN for anxiety.    Fibromyalgia: diagnosed 14 years ago by Richardson Landry, MD; moved back to [place].  Pain mostly in lower back and hips and legs.  No previous orthopedic or spine specialist; no previous rheumatologist.  Did see neurologist for migraines.  Gabapentin 300mg  1 and 2.  Flexeril PRN. Oxycodone PRN; has eased off and not taking many.  Migraines:  S/p neurology consultation/[place] at [facility]; Topamax and butalbital.   Increased stress since July 2017; daughter recently arrested.  Does not check BP much; with sinus headaches.  Not working since work injury.  Knows difference in migraines and sinus headache.   HTN: Lisinopril and Propanolol.  S/p stress testing in the past five years.   BP Readings from Last 3 Encounters:  05/29/16 136/82  05/21/16 122/80  04/22/16 (!) 128/94   Wt Readings from Last 3 Encounters:  05/29/16 233 lb 3.2 oz (105.8 kg)  05/21/16 230 lb 3.2 oz (104.4 kg)    04/22/16 229 lb (103.9 kg)    GERD: Patient reports good compliance with medication, good tolerance to medication, and good symptom control.  Dexilant daily; started suffering with nausea. Will start vomiting at times.    Review of Systems  Constitutional: Negative for chills, diaphoresis, fatigue and fever.  Eyes: Negative for visual disturbance.  Respiratory: Negative for cough and shortness of breath.   Cardiovascular: Negative for chest pain, palpitations and leg swelling.  Gastrointestinal: Negative for abdominal pain, constipation, diarrhea, nausea and vomiting.  Endocrine: Negative for cold intolerance, heat intolerance, polydipsia, polyphagia and polyuria.  Musculoskeletal: Positive for arthralgias, back pain and gait problem.  Neurological: Positive for headaches. Negative for dizziness, tremors, seizures, syncope, facial asymmetry, speech difficulty, weakness, light-headedness and numbness.  Psychiatric/Behavioral: Positive for dysphoric mood and sleep disturbance. Negative for self-injury and suicidal ideas. The patient is nervous/anxious.     Past Medical History:  Diagnosis Date  . Allergy    [name], Flonase  . Anemia   . Anxiety   . Chronic kidney disease   . Depression   . Fibromyalgia   . GERD (gastroesophageal reflux disease)   . Hypertension   . Migraine   . Osteoporosis   . Thyroid disease    Past Surgical History:  Procedure Laterality Date  . ABDOMINAL HYSTERECTOMY    . ROTATOR CUFF REPAIR     Allergies  Allergen Reactions  . Sulfa Antibiotics    Current Outpatient Prescriptions  Medication Sig Dispense Refill  . buPROPion (WELLBUTRIN XL) 300 MG 24 hr  tablet TAKE 1 TABLET BY MOUTH DAILY. 90 tablet 1  . butalbital-acetaminophen-caffeine (FIORICET, ESGIC) 50-325-40 MG tablet Take 1-2 tablets up to twice a day for migraine 14 tablet 0  . cyclobenzaprine (FLEXERIL) 10 MG tablet TAKE 1/2 TO 1 TABLET BY MOUTH 3 TIMES DAILY AS NEEDED FOR MUSCLE SPASMS 180  tablet 1  . darifenacin (ENABLEX) 15 MG 24 hr tablet TAKE 1 TABLET BY MOUTH DAILY 90 tablet 1  . DEXILANT 60 MG capsule TAKE 1 CAPSULE BY MOUTH DAILY. 90 capsule 1  . FLUoxetine (PROZAC) 40 MG capsule TAKE 1 CAPSULE BY MOUTH DAILY. 90 capsule 1  . hydrOXYzine (VISTARIL) 25 MG capsule Take 1 capsule (25 mg total) by mouth at bedtime as needed. 30 capsule 0  . lisinopril (PRINIVIL,ZESTRIL) 20 MG tablet TAKE 1 TABLET BY MOUTH DAILY. 90 tablet 1  . ondansetron (ZOFRAN ODT) 4 MG disintegrating tablet Take 1 tablet (4 mg total) by mouth every 8 (eight) hours as needed for nausea or vomiting. 10 tablet 0  . oxyCODONE-acetaminophen (PERCOCET/ROXICET) 5-325 MG tablet Take 1 tablet by mouth every 8 (eight) hours as needed for moderate pain or severe pain. 30 tablet 0  . propranolol ER (INDERAL LA) 120 MG 24 hr capsule TAKE 1 CAPSULE BY MOUTH DAILY. 90 capsule 1  . gabapentin (NEURONTIN) 300 MG capsule TAKE 1 CAPSULE BY MOUTH IN THE MORNING AND 2 CAPSULES AT BEDTIME 270 capsule 3  . ranitidine (ZANTAC) 150 MG tablet Take 1 tablet (150 mg total) by mouth at bedtime. 30 tablet 5  . topiramate (TOPAMAX) 50 MG tablet TAKE 1-2 TABLETS BY MOUTH NIGHTLY FOR HEADACHE PREVENTION 90 tablet 3   No current facility-administered medications for this visit.    Social History   Social History  . Marital status: Married    Spouse name: N/A  . Number of children: N/A  . Years of education: N/A   Occupational History  . unemployed    Social History Main Topics  . Smoking status: Never Smoker  . Smokeless tobacco: Not on file  . Alcohol use No  . Drug use: Unknown  . Sexual activity: No   Other Topics Concern  . Not on file   Social History Narrative  . No narrative on file   Family History  Problem Relation Age of Onset  . Heart disease Mother   . Hypertension Mother   . Cancer Father   . Heart disease Father   . Hypertension Father   . Stroke Father        Objective:    BP 122/80   Pulse 65    Temp 97.7 F (36.5 C) (Oral)   Resp 18   Ht 5' 4.25" (1.632 m)   Wt 230 lb 3.2 oz (104.4 kg)   SpO2 97%   BMI 39.21 kg/m  Physical Exam  Constitutional: She is oriented to person, place, and time. She appears well-developed and well-nourished. No distress.  HENT:  Head: Normocephalic and atraumatic.  Right Ear: External ear normal.  Left Ear: External ear normal.  Nose: Nose normal.  Mouth/Throat: Oropharynx is clear and moist.  Eyes: Conjunctivae and EOM are normal. Pupils are equal, round, and reactive to light.  Neck: Normal range of motion. Neck supple. Carotid bruit is not present. No thyromegaly present.  Cardiovascular: Normal rate, regular rhythm, normal heart sounds and intact distal pulses.  Exam reveals no gallop and no friction rub.   No murmur heard. Pulmonary/Chest: Effort normal and breath sounds normal. She has  no wheezes. She has no rales.  Abdominal: Soft. Bowel sounds are normal. She exhibits no distension and no mass. There is no tenderness. There is no rebound and no guarding.  Lymphadenopathy:    She has no cervical adenopathy.  Neurological: She is alert and oriented to person, place, and time. No cranial nerve deficit.  Skin: Skin is warm and dry. No rash noted. She is not diaphoretic. No erythema. No pallor.  Psychiatric: She has a normal mood and affect. Her behavior is normal.        Assessment & Plan:   1. Non-intractable vomiting with nausea, unspecified vomiting type   2. Osteopenia of multiple sites   3. Fibromyalgia   4. Essential hypertension, benign   5. Gastroesophageal reflux disease without esophagitis   6. Chronic pain syndrome   7. Adjustment disorder with mixed anxiety and depressed mood   8. Hx of migraine headaches   9. Flu vaccine need    -rx for Zantac provided due to frequent nausea with vomiting; refer to GI. -obtain bone density scan for follow-up of osteopenia. -diagnosis of fibromyalgia in the past yet no previous  rheumatology consultation or orthopedic consultation; consider referral to both in future to rule out secondary contributing etiologies. -blood pressure controlled; obtain labs; continue current medications. -emotionally stable and has weaned off of benzos due to chronic narcotic use.  Continue current medications; utilize hydroxyzine at any time would utilize benzo in the past.   Orders Placed This Encounter  Procedures  . DG Bone Density    Standing Status:   Future    Standing Expiration Date:   07/21/2017    Order Specific Question:   Reason for Exam (SYMPTOM  OR DIAGNOSIS REQUIRED)    Answer:   osteopenia    Order Specific Question:   Is the patient pregnant?    Answer:   No    Order Specific Question:   Preferred imaging location?    Answer:   [facility]  . Flu Vaccine QUAD 36+ mos IM  . CBC with Differential/Platelet  . Comprehensive metabolic panel    Order Specific Question:   Has the patient fasted?    Answer:   Yes  . Lipid panel    Order Specific Question:   Has the patient fasted?    Answer:   Yes  . TSH  . VITAMIN D 25 Hydroxy (Vit-D Deficiency, Fractures)  . Vitamin B12  . Ambulatory referral to Gastroenterology    Referral Priority:   Routine    Referral Type:   Consultation    Referral Reason:   Specialty Services Required    Number of Visits Requested:   1   Meds ordered this encounter  Medications  . ranitidine (ZANTAC) 150 MG tablet    Sig: Take 1 tablet (150 mg total) by mouth at bedtime.    Dispense:  30 tablet    Refill:  5    Return in about 3 months (around 08/21/2016) for nausea, fibromyalgia.   Kailash Hinze Elayne Guerin, M.D. [facility] [address] [place], [place]  [address] [phone] phone [phone] fax

## 2016-05-21 NOTE — Patient Instructions (Addendum)
     IF you received an x-ray today, you will receive an invoice from [facility]. Please contact [facility] at [phone] with questions or concerns regarding your invoice.   IF you received labwork today, you will receive an invoice from [employer]. Please contact [facility] at [phone] with questions or concerns regarding your invoice.   Our billing staff will not be able to assist you with questions regarding bills from these companies.  You will be contacted with the lab results as soon as they are available. The fastest way to get your results is to activate your My Chart account. Instructions are located on the last page of this paperwork. If you have not heard from [place] regarding the results in 2 weeks, please contact this office.     Food Choices for Gastroesophageal Reflux Disease, Adult When you have gastroesophageal reflux disease (GERD), the foods you eat and your eating habits are very important. Choosing the right foods can help ease the discomfort of GERD. WHAT GENERAL GUIDELINES DO I NEED TO FOLLOW?  Choose fruits, vegetables, whole grains, low-fat dairy products, and low-fat meat, fish, and poultry.  Limit fats such as oils, salad dressings, butter, nuts, and avocado.  Keep a food diary to identify foods that cause symptoms.  Avoid foods that cause reflux. These may be different for different people.  Eat frequent small meals instead of three large meals each day.  Eat your meals slowly, in a relaxed setting.  Limit fried foods.  Cook foods using methods other than frying.  Avoid drinking alcohol.  Avoid drinking large amounts of liquids with your meals.  Avoid bending over or lying down until 2-3 hours after eating. WHAT FOODS ARE NOT RECOMMENDED? The following are some foods and drinks that may worsen your symptoms: Vegetables Tomatoes. Tomato juice. Tomato and spaghetti sauce. Chili peppers. Onion and  garlic. Horseradish. Fruits Oranges, grapefruit, and lemon (fruit and juice). Meats High-fat meats, fish, and poultry. This includes hot dogs, ribs, ham, sausage, salami, and bacon. Dairy Whole milk and chocolate milk. Sour cream. Cream. Butter. Ice cream. Cream cheese.  Beverages Coffee and tea, with or without caffeine. Carbonated beverages or energy drinks. Condiments Hot sauce. Barbecue sauce.  Sweets/Desserts Chocolate and cocoa. Donuts. Peppermint and spearmint. Fats and Oils High-fat foods, including [place] fries and potato chips. Other Vinegar. Strong spices, such as black pepper, white pepper, red pepper, cayenne, curry powder, cloves, ginger, and chili powder. The items listed above may not be a complete list of foods and beverages to avoid. Contact your dietitian for more information.   This information is not intended to replace advice given to you by your health care provider. Make sure you discuss any questions you have with your health care provider.   Document Released: 07/21/2005 Document Revised: 08/11/2014 Document Reviewed: 05/25/2013 Elsevier Interactive Patient Education [employer].

## 2016-05-21 NOTE — Telephone Encounter (Signed)
SMITH - Pt saw you today and said that you forgot to feel a couple of her prescriptions.  She needs the topiramate and her gabapentin refilled.  She is completely out.  [phone]

## 2016-05-22 LAB — VITAMIN B12: VITAMIN B 12: 263 pg/mL (ref 200–1100)

## 2016-05-22 LAB — VITAMIN D 25 HYDROXY (VIT D DEFICIENCY, FRACTURES): VIT D 25 HYDROXY: 26 ng/mL — AB (ref 30–100)

## 2016-05-23 ENCOUNTER — Encounter: Payer: Self-pay | Admitting: Gastroenterology

## 2016-05-23 MED ORDER — TOPIRAMATE 50 MG PO TABS: ORAL_TABLET | ORAL | 3 refills | Status: DC

## 2016-05-23 MED ORDER — GABAPENTIN 300 MG PO CAPS: ORAL_CAPSULE | ORAL | 3 refills | Status: DC

## 2016-05-23 MED FILL — TOPIRAMATE 50 MG TABLET: 50 | 45 days supply | Qty: 90 | Fill #0

## 2016-05-23 MED FILL — GABAPENTIN 300 MG CAPSULE: 300 | 90 days supply | Qty: 270 | Fill #0

## 2016-05-23 NOTE — Telephone Encounter (Signed)
Both rx escribed to [facility].

## 2016-05-29 ENCOUNTER — Encounter: Payer: Self-pay | Admitting: Family Medicine

## 2016-05-29 ENCOUNTER — Ambulatory Visit (INDEPENDENT_AMBULATORY_CARE_PROVIDER_SITE_OTHER): Payer: 59 | Admitting: Family Medicine

## 2016-05-29 VITALS — BP 136/82 | HR 76 | Temp 98.1°F | Resp 18 | Ht 63.5 in | Wt 233.2 lb

## 2016-05-29 DIAGNOSIS — G8929 Other chronic pain: Secondary | ICD-10-CM | POA: Diagnosis not present

## 2016-05-29 DIAGNOSIS — M79601 Pain in right arm: Secondary | ICD-10-CM

## 2016-05-29 DIAGNOSIS — G5621 Lesion of ulnar nerve, right upper limb: Secondary | ICD-10-CM | POA: Diagnosis not present

## 2016-05-29 NOTE — Progress Notes (Signed)
Subjective:    Patient ID: [name], female    DOB: Mar 31, 1956, 60 y.o.   MRN: [id number]  05/29/2016  to discuss meds.   HPI This 60 y.o. female presents for one week follow-up.  Must get a form completed.  Previously followed by Tor Netters, FNP.  Also saw Dr. Carlota Raspberry 04/22/16 for acute illness.  Receiving disability coverage; must be under a provider's care to receive ongoing disability.  Needs proof of continuity of care.  Must take Flexeril for muscle spasm; must take Percocet for R sided injury; takes Neurontin for RLS.  Neurontin is also for R arm ulnar injury.  Had a bad fall at work.  Made a settlement; unable to return to original job due to ongoing limitations.  Undergoing disability process currently.  Needs an updated form.  Workman's compensation physician released patient; underwent FCE; light duty recommended.  Unable to grip well in R dominant hand; unable to use R arm for much. Feels like tooth ache.  Dislocated shoulder and also hit elbow on concrete.  Had shoulder dislocation.  S/p ulnar neuropathy decompression. Now has pain; strength never completely returned.  Fredna Dow states that scar tissue is causing the pain and will likely not improve.  Now throbbing all the time. Hypersensitive along R forearm.  NCS performed showed improvement.  Now having more pain since decompression instead of numbness. Has cramping and numbness in ulnar distribution.  Dominant hand.    Now eats L handed.  Cannot lay R arm on the table.  Just now able to hold the hair dryer with tumb and 2nd, 3rd fingers; drops hair dryer and also drops plastic spoon.  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  Must grip and pull down.  Screen prints weight 30 plus pounds.  FCE limited lifiting with R hand 5-15 pounds.  Dalldorf performed ulnar nerve decompression.  Dalldorf released patient in 03/2015.  Dr. Melton Alar performed NCS x 2.  Dr. Fredna Dow is hand specialist performed  second opinion.  Went to [place] because neuropathy not improving.   Review of Systems  Constitutional: Negative for chills, diaphoresis, fatigue and fever.  Eyes: Negative for visual disturbance.  Respiratory: Negative for cough and shortness of breath.   Cardiovascular: Negative for chest pain, palpitations and leg swelling.  Gastrointestinal: Negative for abdominal pain, constipation, diarrhea, nausea and vomiting.  Endocrine: Negative for cold intolerance, heat intolerance, polydipsia, polyphagia and polyuria.  Musculoskeletal: Positive for arthralgias and myalgias. Negative for back pain, gait problem, joint swelling, neck pain and neck stiffness.  Neurological: Positive for weakness and numbness. Negative for dizziness, tremors, seizures, syncope, facial asymmetry, speech difficulty, light-headedness and headaches.    Past Medical History:  Diagnosis Date  . Allergy    [name], Flonase  . Anemia   . Anxiety   . Chronic kidney disease   . Depression   . Fibromyalgia   . GERD (gastroesophageal reflux disease)   . Hypertension   . Migraine   . Osteoporosis   . Thyroid disease    Past Surgical History:  Procedure Laterality Date  . ABDOMINAL HYSTERECTOMY    . ROTATOR CUFF REPAIR     Allergies  Allergen Reactions  . Sulfa Antibiotics    Current Outpatient Prescriptions  Medication Sig Dispense Refill  . buPROPion (WELLBUTRIN XL) 300 MG 24 hr tablet TAKE 1 TABLET BY MOUTH DAILY. 90 tablet 1  . butalbital-acetaminophen-caffeine (FIORICET, ESGIC) 50-325-40 MG tablet Take 1-2 tablets up to  twice a day for migraine 14 tablet 0  . cyclobenzaprine (FLEXERIL) 10 MG tablet TAKE 1/2 TO 1 TABLET BY MOUTH 3 TIMES DAILY AS NEEDED FOR MUSCLE SPASMS 180 tablet 1  . darifenacin (ENABLEX) 15 MG 24 hr tablet TAKE 1 TABLET BY MOUTH DAILY 90 tablet 1  . DEXILANT 60 MG capsule TAKE 1 CAPSULE BY MOUTH DAILY. 90 capsule 1  . FLUoxetine (PROZAC) 40 MG capsule TAKE 1 CAPSULE BY MOUTH DAILY. 90  capsule 1  . gabapentin (NEURONTIN) 300 MG capsule TAKE 1 CAPSULE BY MOUTH IN THE MORNING AND 2 CAPSULES AT BEDTIME 270 capsule 3  . hydrOXYzine (VISTARIL) 25 MG capsule Take 1 capsule (25 mg total) by mouth at bedtime as needed. 30 capsule 0  . lisinopril (PRINIVIL,ZESTRIL) 20 MG tablet TAKE 1 TABLET BY MOUTH DAILY. 90 tablet 1  . ondansetron (ZOFRAN ODT) 4 MG disintegrating tablet Take 1 tablet (4 mg total) by mouth every 8 (eight) hours as needed for nausea or vomiting. 10 tablet 0  . oxyCODONE-acetaminophen (PERCOCET/ROXICET) 5-325 MG tablet Take 1 tablet by mouth every 8 (eight) hours as needed for moderate pain or severe pain. 30 tablet 0  . propranolol ER (INDERAL LA) 120 MG 24 hr capsule TAKE 1 CAPSULE BY MOUTH DAILY. 90 capsule 1  . ranitidine (ZANTAC) 150 MG tablet Take 1 tablet (150 mg total) by mouth at bedtime. 30 tablet 5  . topiramate (TOPAMAX) 50 MG tablet TAKE 1-2 TABLETS BY MOUTH NIGHTLY FOR HEADACHE PREVENTION 90 tablet 3   No current facility-administered medications for this visit.    Social History   Social History  . Marital status: Married    Spouse name: N/A  . Number of children: N/A  . Years of education: N/A   Occupational History  . unemployed    Social History Main Topics  . Smoking status: Never Smoker  . Smokeless tobacco: Not on file  . Alcohol use No  . Drug use: Unknown  . Sexual activity: No   Other Topics Concern  . Not on file   Social History Narrative  . No narrative on file   Family History  Problem Relation Age of Onset  . Heart disease Mother   . Hypertension Mother   . Cancer Father   . Heart disease Father   . Hypertension Father   . Stroke Father        Objective:    BP 136/82 (BP Location: Right Arm, Patient Position: Sitting, Cuff Size: Large)   Pulse 76   Temp 98.1 F (36.7 C) (Oral)   Resp 18   Ht 5' 3.5" (1.613 m)   Wt 233 lb 3.2 oz (105.8 kg)   SpO2 98%   BMI 40.66 kg/m  Physical Exam  Constitutional: She  is oriented to person, place, and time. She appears well-developed and well-nourished. No distress.  HENT:  Head: Normocephalic and atraumatic.  Right Ear: External ear normal.  Left Ear: External ear normal.  Nose: Nose normal.  Mouth/Throat: Oropharynx is clear and moist.  Eyes: Conjunctivae and EOM are normal. Pupils are equal, round, and reactive to light.  Neck: Normal range of motion. Neck supple. Carotid bruit is not present. No thyromegaly present.  Cardiovascular: Normal rate, regular rhythm, normal heart sounds and intact distal pulses.  Exam reveals no gallop and no friction rub.   No murmur heard. Pulmonary/Chest: Effort normal and breath sounds normal. She has no wheezes. She has no rales.  Abdominal: Soft. Bowel sounds are normal.  She exhibits no distension and no mass. There is no tenderness. There is no rebound and no guarding.  Musculoskeletal:       Right shoulder: She exhibits decreased range of motion and decreased strength. She exhibits no tenderness, no bony tenderness, no pain, no spasm and normal pulse.       Right elbow: She exhibits normal range of motion and no swelling. No tenderness found. No radial head, no medial epicondyle, no lateral epicondyle and no olecranon process tenderness noted.       Right wrist: She exhibits normal range of motion, no tenderness and no bony tenderness.       Right hand: She exhibits decreased range of motion. She exhibits no tenderness, no bony tenderness, normal capillary refill, no deformity and no swelling. Decreased strength noted.  R SHOULDER: elevates to 150 degrees; no pain with movement. R ELBOW: full extension and flexion; motor 5/5. R WRIST: no effusion/swelling; normal extension and flexion; motor 5/5. R HAND: grip 4/5; full extension and flexion of digits.  Lymphadenopathy:    She has no cervical adenopathy.  Neurological: She is alert and oriented to person, place, and time. No cranial nerve deficit.  Skin: Skin is warm  and dry. No rash noted. She is not diaphoretic. No erythema. No pallor.  Psychiatric: She has a normal mood and affect. Her behavior is normal.   Results for orders placed or performed in visit on 05/21/16  CBC with Differential/Platelet  Result Value Ref Range   WBC 5.4 3.8 - 10.8 K/uL   RBC 4.28 3.80 - 5.10 MIL/uL   Hemoglobin 12.7 11.7 - 15.5 g/dL   HCT 38.5 35.0 - 45.0 %   MCV 90.0 80.0 - 100.0 fL   MCH 29.7 27.0 - 33.0 pg   MCHC 33.0 32.0 - 36.0 g/dL   RDW 13.3 11.0 - 15.0 %   Platelets 221 140 - 400 K/uL   MPV 9.9 7.5 - 12.5 fL   Neutro Abs 3,510 1,500 - 7,800 cells/uL   Lymphs Abs 1,404 850 - 3,900 cells/uL   Monocytes Absolute 378 200 - 950 cells/uL   Eosinophils Absolute 108 15 - 500 cells/uL   Basophils Absolute 0 0 - 200 cells/uL   Neutrophils Relative % 65 %   Lymphocytes Relative 26 %   Monocytes Relative 7 %   Eosinophils Relative 2 %   Basophils Relative 0 %   Smear Review Criteria for review not met   Comprehensive metabolic panel  Result Value Ref Range   Sodium 142 135 - 146 mmol/L   Potassium 4.8 3.5 - 5.3 mmol/L   Chloride 109 98 - 110 mmol/L   CO2 24 20 - 31 mmol/L   Glucose, Bld 87 65 - 99 mg/dL   BUN 13 7 - 25 mg/dL   Creat 1.01 (H) 0.50 - 0.99 mg/dL   Total Bilirubin 0.4 0.2 - 1.2 mg/dL   Alkaline Phosphatase 74 33 - 130 U/L   AST 18 10 - 35 U/L   ALT 10 6 - 29 U/L   Total Protein 6.9 6.1 - 8.1 g/dL   Albumin 4.2 3.6 - 5.1 g/dL   Calcium 9.2 8.6 - 10.4 mg/dL  Lipid panel  Result Value Ref Range   Cholesterol 250 (H) 125 - 200 mg/dL   Triglycerides 65 <150 mg/dL   HDL 73 >=46 mg/dL   Total CHOL/HDL Ratio 3.4 <=5.0 Ratio   VLDL 13 <30 mg/dL   LDL Cholesterol 164 (H) <130 mg/dL  TSH  Result Value Ref Range   TSH 1.76 mIU/L  VITAMIN D 25 Hydroxy (Vit-D Deficiency, Fractures)  Result Value Ref Range   Vit D, 25-Hydroxy 26 (L) 30 - 100 ng/mL  Vitamin B12  Result Value Ref Range   Vitamin B-12 263 200 - 1,100 pg/mL       Assessment &  Plan:   1. Neuropathy of right ulnar nerve at wrist   2. Chronic pain of right upper extremity    -new to this provided. -pt with weakness and numbness in 4th, 5th fingers of R upper extremity.  Dominant hand. -maintained on Neurontin, Flexeril, and oxycodone for persistent symptoms of R upper extremity. -consider increasing Gabapentin to tid or qid dosing for improving neuropathy treatment. -letter provided reporting treatment and visits for the past one year. -obtain records from [facility]; s/p FCE; pt reports limited to lifting 5-15 pounds in R hand; previous job required lifting of 30+ pounds.   No orders of the defined types were placed in this encounter.  No orders of the defined types were placed in this encounter.   No Follow-up on file.  Aurelio Mccamy Elayne Guerin, M.D. [facility] [address] [place], [place]  [address] [phone] phone [phone] fax

## 2016-05-29 NOTE — Patient Instructions (Signed)
     IF you received an x-ray today, you will receive an invoice from [facility]. Please contact [facility] at [phone] with questions or concerns regarding your invoice.   IF you received labwork today, you will receive an invoice from [facility]. Please contact [facility] at [phone] with questions or concerns regarding your invoice.   Our billing staff will not be able to assist you with questions regarding bills from these companies.  You will be contacted with the lab results as soon as they are available. The fastest way to get your results is to activate your My Chart account. Instructions are located on the last page of this paperwork. If you have not heard from us regarding the results in 2 weeks, please contact this office.      

## 2016-07-10 ENCOUNTER — Ambulatory Visit: Payer: 59

## 2016-07-15 ENCOUNTER — Encounter: Payer: Self-pay | Admitting: Family Medicine

## 2016-07-15 ENCOUNTER — Ambulatory Visit (INDEPENDENT_AMBULATORY_CARE_PROVIDER_SITE_OTHER): Payer: 59 | Admitting: Family Medicine

## 2016-07-15 VITALS — BP 136/92 | HR 70 | Temp 99.4°F | Resp 16 | Ht 63.5 in | Wt 234.6 lb

## 2016-07-15 DIAGNOSIS — M797 Fibromyalgia: Secondary | ICD-10-CM

## 2016-07-15 DIAGNOSIS — F4323 Adjustment disorder with mixed anxiety and depressed mood: Secondary | ICD-10-CM

## 2016-07-15 DIAGNOSIS — G894 Chronic pain syndrome: Secondary | ICD-10-CM

## 2016-07-15 DIAGNOSIS — H00011 Hordeolum externum right upper eyelid: Secondary | ICD-10-CM

## 2016-07-15 DIAGNOSIS — I10 Essential (primary) hypertension: Secondary | ICD-10-CM

## 2016-07-15 DIAGNOSIS — E7800 Pure hypercholesterolemia, unspecified: Secondary | ICD-10-CM | POA: Diagnosis not present

## 2016-07-15 DIAGNOSIS — K219 Gastro-esophageal reflux disease without esophagitis: Secondary | ICD-10-CM | POA: Diagnosis not present

## 2016-07-15 DIAGNOSIS — Z8669 Personal history of other diseases of the nervous system and sense organs: Secondary | ICD-10-CM | POA: Diagnosis not present

## 2016-07-15 MED ORDER — ATORVASTATIN CALCIUM 10 MG PO TABS: 10.0000 mg | ORAL_TABLET | Freq: Every day | ORAL | 1 refills | Status: DC

## 2016-07-15 MED ORDER — GABAPENTIN 300 MG PO CAPS: ORAL_CAPSULE | ORAL | 0 refills | Status: DC

## 2016-07-15 MED FILL — GABAPENTIN 300 MG CAPSULE: 300 | 90 days supply | Qty: 360 | Fill #0

## 2016-07-15 MED FILL — ATORVASTATIN 10 MG TABLET: 10 | 90 days supply | Qty: 90 | Fill #0

## 2016-07-15 NOTE — Patient Instructions (Addendum)
1.  Adding Atorvastatin/Lipitor 10mg  one tablet at bedtime for cholesterol; return in one month fasting. 2.  Change Gabapentin/Neurontin 300mg  --- 1 tablet four times daily (every 4-6 hours) 3.  Return in one month; will undergo blood work at that visit. 4.  Start Multivitamin for B12 and Vitamin D. 5.  Apply warm compresses to R eye twice daily for 15 minutes; start allergy tablet daily for one week for stye.   Stye A stye is a bump on your eyelid caused by a bacterial infection. A stye can form inside the eyelid (internal stye) or outside the eyelid (external stye). An internal stye may be caused by an infected oil-producing gland inside your eyelid. An external stye may be caused by an infection at the base of your eyelash (hair follicle). Styes are very common. Anyone can get them at any age. They usually occur in just one eye, but you may have more than one in either eye. What are the causes? The infection is almost always caused by bacteria called Staphylococcus aureus. This is a common type of bacteria that lives on your skin. What increases the risk? You may be at higher risk for a stye if you have had one before. You may also be at higher risk if you have:  Diabetes.  Long-term illness.  Long-term eye redness.  A skin condition called seborrhea.  High fat levels in your blood (lipids). What are the signs or symptoms? Eyelid pain is the most common symptom of a stye. Internal styes are more painful than external styes. Other signs and symptoms may include:  Painful swelling of your eyelid.  A scratchy feeling in your eye.  Tearing and redness of your eye.  Pus draining from the stye. How is this diagnosed? Your health care provider may be able to diagnose a stye just by examining your eye. The health care provider may also check to make sure:  You do not have a fever or other signs of a more serious infection.  The infection has not spread to other parts of your eye or  areas around your eye. How is this treated? Most styes will clear up in a few days without treatment. In some cases, you may need to use antibiotic drops or ointment to prevent infection. Your health care provider may have to drain the stye surgically if your stye is:  Large.  Causing a lot of pain.  Interfering with your vision. This can be done using a thin blade or a needle. Follow these instructions at home:  Take medicines only as directed by your health care provider.  Apply a clean, warm compress to your eye for 10 minutes, 4 times a day.  Do not wear contact lenses or eye makeup until your stye has healed.  Do not try to pop or drain the stye. Contact a health care provider if:  You have chills or a fever.  Your stye does not go away after several days.  Your stye affects your vision.  Your eyeball becomes swollen, red, or painful. This information is not intended to replace advice given to you by your health care provider. Make sure you discuss any questions you have with your health care provider. Document Released: 04/30/2005 Document Revised: 03/16/2016 Document Reviewed: 11/04/2013 Elsevier Interactive Patient Education  2017 [employer].     IF you received an x-ray today, you will receive an invoice from [facility]. Please contact [facility] at [phone] with questions or concerns regarding your  invoice.   IF you received labwork today, you will receive an invoice from [employer]. Please contact [facility] at [phone] with questions or concerns regarding your invoice.   Our billing staff will not be able to assist you with questions regarding bills from these companies.  You will be contacted with the lab results as soon as they are available. The fastest way to get your results is to activate your My Chart account. Instructions are located on the last page of this paperwork. If you have not heard from [place]  regarding the results in 2 weeks, please contact this office.

## 2016-07-15 NOTE — Progress Notes (Signed)
Subjective:    Patient ID: [name], female    DOB: August 04, 1956, 60 y.o.   MRN: [id number]  07/15/2016  Follow-up (3 mos, depression scale during triage, score 19); Hypertension; Fibromyalgia; and Medication Refill   HPI This 60 y.o. female presents for six week follow-up for hypertension, fibromyalgia, medical management.   Hydroxyzine 75m qhs not effective for insomnia.  Needs something for nerves.   Anxiety and depression: Wellbutrin XL 3033mdaily. Prozac 4025maily; has taken both of those for years several. Previously taking three at one time.  Having a lot of excessive crying due to limitations of R hand. Drops everything with R hand; cannot lift things or elevate above head.  With April must go to court; major stresses.  Vistaril not effective.  April and the girls live with patient and husband.    Migraines: Fioricet PRN; with worsening depression, migraines have lessened.  One in past six weeks. Topamax 40m54ms.  Inderal. No longer seeing neurologist.    DDD neck with R neuropathy: Flexeril one to two times daily for neck pain due to injury work. Neurontin 300mg33m every morning; two at bedtime. Trying not to take Oxycodone; feels that anxiety worsening due to increasing pain.  Took two oxycodone last week.  Decreased oxycodone one week ago; was taking bid and afraid to get hooked.  Stiff in morning; Gabapentin not very effective.  Usually does a lot of running around to do in afternoon; hurts a lot.  Takes first Gabapentin 8:00am; DATE IN MARCH FOR INITIAL DISABILITY HEARING.  APPLIED TWICE AND DECLINED/REJECTED; FIRST HEARING.  Overactive bladder: Enablex; no urologist currently.   Nausea/GERD: Dexilant 60mg 37my; Zantac qhs; still having nausea.  Appointment with GI this week.  Vomits three days per week.  Zofran PRN.   HTN: Lisinopril 20mg d54m.  Inderal.    Hypercholesterolemia: TC 250; father with CVA at 64.  Mo96er with AMI at age 67.  Im9nization History    Administered Date(s) Administered  . Influenza,inj,Quad PF,36+ Mos 05/28/2015, 05/21/2016  . Tdap 06/25/2015   BP Readings from Last 3 Encounters:  07/18/16 136/90  07/15/16 (!) 136/92  05/29/16 136/82   Wt Readings from Last 3 Encounters:  07/18/16 238 lb 2 oz (108 kg)  07/15/16 234 lb 9.6 oz (106.4 kg)  05/29/16 233 lb 3.2 oz (105.8 kg)     Review of Systems  Constitutional: Negative for activity change, appetite change, chills, diaphoresis, fatigue, fever and unexpected weight change.  HENT: Negative for congestion, dental problem, drooling, ear discharge, ear pain, facial swelling, hearing loss, mouth sores, nosebleeds, postnasal drip, rhinorrhea, sinus pressure, sneezing, sore throat, tinnitus, trouble swallowing and voice change.   Eyes: Negative for photophobia, pain, discharge, redness, itching and visual disturbance.  Respiratory: Negative for apnea, cough, choking, chest tightness, shortness of breath, wheezing and stridor.   Cardiovascular: Negative for chest pain, palpitations and leg swelling.  Gastrointestinal: Positive for nausea. Negative for abdominal distention, abdominal pain, anal bleeding, blood in stool, constipation, diarrhea, rectal pain and vomiting.  Endocrine: Negative for cold intolerance, heat intolerance, polydipsia, polyphagia and polyuria.  Genitourinary: Negative for decreased urine volume, difficulty urinating, dyspareunia, dysuria, enuresis, flank pain, frequency, genital sores, hematuria, menstrual problem, pelvic pain, urgency, vaginal bleeding, vaginal discharge and vaginal pain.  Musculoskeletal: Positive for myalgias and neck pain. Negative for arthralgias, back pain, gait problem, joint swelling and neck stiffness.  Skin: Negative for color change, pallor, rash and wound.  Allergic/Immunologic: Negative for environmental allergies, food allergies  and immunocompromised state.  Neurological: Positive for weakness and numbness. Negative for  dizziness, tremors, seizures, syncope, facial asymmetry, speech difficulty, light-headedness and headaches.  Hematological: Negative for adenopathy. Does not bruise/bleed easily.  Psychiatric/Behavioral: Positive for dysphoric mood. Negative for agitation, behavioral problems, confusion, decreased concentration, hallucinations, self-injury, sleep disturbance and suicidal ideas. The patient is nervous/anxious. The patient is not hyperactive.     Past Medical History:  Diagnosis Date  . Allergy    [name], Flonase  . Anemia   . Anxiety   . Arthritis   . Chronic kidney disease   . Colon polyps   . Depression   . Fibromyalgia   . GERD (gastroesophageal reflux disease)   . HLD (hyperlipidemia)   . Hypertension   . Migraine   . Neuropathy ([place])   . Osteoporosis   . Pneumonia   . Sleep apnea with use of continuous positive airway pressure (CPAP)   . Thyroid goiter    Past Surgical History:  Procedure Laterality Date  . PARTIAL HYSTERECTOMY    . ROTATOR CUFF REPAIR Right 2009  . SHOULDER SURGERY Right    shoulder dislocation, fall, and elbow unla nerve   Allergies  Allergen Reactions  . Sulfa Antibiotics     Social History   Social History  . Marital status: Married    Spouse name: N/A  . Number of children: 3  . Years of education: N/A   Occupational History  . unemployed    Social History Main Topics  . Smoking status: Former Smoker    Quit date: 08/04/1998  . Smokeless tobacco: Never Used  . Alcohol use No  . Drug use: No  . Sexual activity: No   Other Topics Concern  . Not on file   Social History Narrative  . No narrative on file   Family History  Problem Relation Age of Onset  . Heart disease Mother   . Hypertension Mother   . Heart disease Father   . Hypertension Father   . Stroke Father   . Prostate cancer Father   . Hypertension Brother   . Melanoma Brother   . Cancer Maternal Grandmother     type unknown  . Cancer Maternal Grandfather      type unknown  . Heart disease Paternal Grandmother   . Heart defect Paternal Grandfather   . Melanoma Brother   . Hypertension Brother   . Hyperlipidemia Brother        Objective:    BP (!) 136/92 (BP Location: Left Arm, Cuff Size: Normal)   Pulse 70   Temp 99.4 F (37.4 C) (Oral)   Resp 16   Ht 5' 3.5" (1.613 m)   Wt 234 lb 9.6 oz (106.4 kg)   SpO2 94%   BMI 40.91 kg/m  Physical Exam  Constitutional: She is oriented to person, place, and time. She appears well-developed and well-nourished. No distress.  HENT:  Head: Normocephalic and atraumatic.  Right Ear: External ear normal.  Left Ear: External ear normal.  Nose: Nose normal.  Mouth/Throat: Oropharynx is clear and moist.  Eyes: Conjunctivae and EOM are normal. Pupils are equal, round, and reactive to light. Right eye exhibits hordeolum.  Neck: Normal range of motion and full passive range of motion without pain. Neck supple. No JVD present. Carotid bruit is not present. No thyromegaly present.  Cardiovascular: Normal rate, regular rhythm and normal heart sounds.  Exam reveals no gallop and no friction rub.   No murmur heard. Pulmonary/Chest: Effort normal  and breath sounds normal. She has no wheezes. She has no rales.  Abdominal: Soft. Bowel sounds are normal. She exhibits no distension and no mass. There is no tenderness. There is no rebound and no guarding.  Musculoskeletal:       Right shoulder: She exhibits decreased range of motion and pain.       Left shoulder: Normal.       Right elbow: Normal.      Cervical back: She exhibits pain.       Left forearm: Normal.  Lymphadenopathy:    She has no cervical adenopathy.  Neurological: She is alert and oriented to person, place, and time. She has normal reflexes. No cranial nerve deficit. She exhibits normal muscle tone. Coordination normal.  Skin: Skin is warm and dry. No rash noted. She is not diaphoretic. No erythema. No pallor.  Psychiatric: She has a normal mood  and affect. Her behavior is normal. Judgment and thought content normal.  Nursing note and vitals reviewed.  Results for orders placed or performed in visit on 05/21/16  CBC with Differential/Platelet  Result Value Ref Range   WBC 5.4 3.8 - 10.8 K/uL   RBC 4.28 3.80 - 5.10 MIL/uL   Hemoglobin 12.7 11.7 - 15.5 g/dL   HCT 38.5 35.0 - 45.0 %   MCV 90.0 80.0 - 100.0 fL   MCH 29.7 27.0 - 33.0 pg   MCHC 33.0 32.0 - 36.0 g/dL   RDW 13.3 11.0 - 15.0 %   Platelets 221 140 - 400 K/uL   MPV 9.9 7.5 - 12.5 fL   Neutro Abs 3,510 1,500 - 7,800 cells/uL   Lymphs Abs 1,404 850 - 3,900 cells/uL   Monocytes Absolute 378 200 - 950 cells/uL   Eosinophils Absolute 108 15 - 500 cells/uL   Basophils Absolute 0 0 - 200 cells/uL   Neutrophils Relative % 65 %   Lymphocytes Relative 26 %   Monocytes Relative 7 %   Eosinophils Relative 2 %   Basophils Relative 0 %   Smear Review Criteria for review not met   Comprehensive metabolic panel  Result Value Ref Range   Sodium 142 135 - 146 mmol/L   Potassium 4.8 3.5 - 5.3 mmol/L   Chloride 109 98 - 110 mmol/L   CO2 24 20 - 31 mmol/L   Glucose, Bld 87 65 - 99 mg/dL   BUN 13 7 - 25 mg/dL   Creat 1.01 (H) 0.50 - 0.99 mg/dL   Total Bilirubin 0.4 0.2 - 1.2 mg/dL   Alkaline Phosphatase 74 33 - 130 U/L   AST 18 10 - 35 U/L   ALT 10 6 - 29 U/L   Total Protein 6.9 6.1 - 8.1 g/dL   Albumin 4.2 3.6 - 5.1 g/dL   Calcium 9.2 8.6 - 10.4 mg/dL  Lipid panel  Result Value Ref Range   Cholesterol 250 (H) 125 - 200 mg/dL   Triglycerides 65 <150 mg/dL   HDL 73 >=46 mg/dL   Total CHOL/HDL Ratio 3.4 <=5.0 Ratio   VLDL 13 <30 mg/dL   LDL Cholesterol 164 (H) <130 mg/dL  TSH  Result Value Ref Range   TSH 1.76 mIU/L  VITAMIN D 25 Hydroxy (Vit-D Deficiency, Fractures)  Result Value Ref Range   Vit D, 25-Hydroxy 26 (L) 30 - 100 ng/mL  Vitamin B12  Result Value Ref Range   Vitamin B-12 263 200 - 1,100 pg/mL       Assessment & Plan:   1.  Pure hypercholesterolemia     2. Essential hypertension   3. Hx of migraine headaches   4. Fibromyalgia   5. Chronic pain syndrome   6. Adjustment disorder with mixed anxiety and depressed mood   7. Gastroesophageal reflux disease without esophagitis   8. Hordeolum externum of right upper eyelid    1.  Adding Atorvastatin/Lipitor 26m one tablet at bedtime for cholesterol; return in one month fasting. 2.  Change Gabapentin/Neurontin 3057m--- 1 tablet four times daily (every 4-6 hours) with plans to increase dose as tolerated at each visit.  Anticipate as pain control improves, anxiety/depression will improve. 3.  Return in one month; will undergo blood work at that visit. 4.  Start Multivitamin for B12 and Vitamin D. 5.  Apply warm compresses to R eye twice daily for 15 minutes; start allergy tablet daily for one week for stye.   No orders of the defined types were placed in this encounter.  Meds ordered this encounter  Medications  . atorvastatin (LIPITOR) 10 MG tablet    Sig: Take 1 tablet (10 mg total) by mouth daily at 6 PM.    Dispense:  90 tablet    Refill:  1  . gabapentin (NEURONTIN) 300 MG capsule    Sig: TAKE 1 CAPSULE FOUR TIMES DAILY.    Dispense:  360 capsule    Refill:  0    Return in about 4 weeks (around 08/12/2016) for recheck.   Kristi MaElayne GuerinM.D. [facility]  [phone] [phone]

## 2016-07-16 ENCOUNTER — Other Ambulatory Visit: Payer: Self-pay

## 2016-07-16 DIAGNOSIS — F4323 Adjustment disorder with mixed anxiety and depressed mood: Secondary | ICD-10-CM

## 2016-07-16 DIAGNOSIS — G4700 Insomnia, unspecified: Secondary | ICD-10-CM

## 2016-07-16 MED FILL — raNITIdine HCL 150 MG TABS: 150 | 30 days supply | Qty: 30 | Fill #1

## 2016-07-16 NOTE — Telephone Encounter (Signed)
Pt seen for CPE with Tamala Julian yesterday:  Fax req [facility] for refills: Bupropion Hcl XL 300mg  Darifenacin ER 15 mg Dexilant DR 60mg  Fluoxetine Hcl 40mg  Hydroxyzine PAM 25 mg Lisinopril 20mg  Ondansetron ODT 4mg  Propranolol ER 120mg 

## 2016-07-18 ENCOUNTER — Ambulatory Visit (INDEPENDENT_AMBULATORY_CARE_PROVIDER_SITE_OTHER): Payer: 59 | Admitting: Gastroenterology

## 2016-07-18 ENCOUNTER — Telehealth: Payer: Self-pay

## 2016-07-18 ENCOUNTER — Encounter: Payer: Self-pay | Admitting: Gastroenterology

## 2016-07-18 VITALS — BP 136/90 | HR 68 | Ht 63.39 in | Wt 238.1 lb

## 2016-07-18 DIAGNOSIS — R112 Nausea with vomiting, unspecified: Secondary | ICD-10-CM | POA: Diagnosis not present

## 2016-07-18 NOTE — Patient Instructions (Addendum)
The #4 most common reaction to Wellbutrin is nausea The #2 most common reaction to Prozac is nausea Nausea is also a common side effect of narcotic pain meds. You will be set up for an upper endoscopy for nausea, vomiting, history of ulcer. We will get records sent from your previous gastroenterologist for review (Dr. Ferdinand Lango in [facility]). This will include any endoscopic (colonoscopy and upper endoscopy in 2011) procedures and any associated pathology reports.

## 2016-07-18 NOTE — Telephone Encounter (Signed)
Pt called in requesting medication that were not pharmacy Advised I can refill Lisinopril, Propranolol, and will route other meds to Dr. Tamala Julian for approval  Verbalized understanding

## 2016-07-18 NOTE — Progress Notes (Signed)
HPI: This is a   very pleasant 60 year old woman    who was referred to me by Wendie Agreste, MD  to evaluate  nausea, intermittent vomiting .    Chief complaint is nausea, intermittent vomiting  Colonoscopy Dr. Ferdinand Lango in 2011 [place]. She thinks he found a polyp but was told it was "not a problem" and that she should have a repeat colonoscopy in 10 years.    She has nausea pretty constantly.  This started 2 months ago.  Vomiting as well for 4-5 weeks.  Eating makes it all worse.     She had an ulcer, she thinks in her stomach.  She says Dr. Ferdinand Lango also did an EGD around 2011  and found an ulcer. She is not sure why she had it but was told not to take aspirin containing medicines in the future   Avoids NSAIDs except only very rarely.  Takes tylenol  Has migraines.  She has gained weight; probably 20 pounds in the past year.  She is on dexilant, has been on it for many years.  Started zantac at bedtime for 4-5 weeks, not helping much  She does get pyrosis even while on dexilant.  Rare intermittent solid food dysphagia.   Blood work October 2017 CBC and complete metabolic profile were normal.   Review of systems: Pertinent positive and negative review of systems were noted in the above HPI section. Complete review of systems was performed and was otherwise normal.   Past Medical History:  Diagnosis Date  . Allergy    [name], Flonase  . Anemia   . Anxiety   . Arthritis   . Chronic kidney disease   . Colon polyps   . Depression   . Fibromyalgia   . GERD (gastroesophageal reflux disease)   . HLD (hyperlipidemia)   . Hypertension   . Migraine   . Neuropathy ([place])   . Osteoporosis   . Pneumonia   . Sleep apnea with use of continuous positive airway pressure (CPAP)   . Thyroid goiter     Past Surgical History:  Procedure Laterality Date  . ABDOMINAL HYSTERECTOMY    . ROTATOR CUFF REPAIR Right 2009  . SHOULDER SURGERY Right    shoulder dislocation, fall,  and elbow unla nerve    Current Outpatient Prescriptions  Medication Sig Dispense Refill  . atorvastatin (LIPITOR) 10 MG tablet Take 1 tablet (10 mg total) by mouth daily at 6 PM. 90 tablet 1  . buPROPion (WELLBUTRIN XL) 300 MG 24 hr tablet TAKE 1 TABLET BY MOUTH DAILY. 90 tablet 1  . butalbital-acetaminophen-caffeine (FIORICET, ESGIC) 50-325-40 MG tablet Take 1-2 tablets up to twice a day for migraine 14 tablet 0  . cyclobenzaprine (FLEXERIL) 10 MG tablet TAKE 1/2 TO 1 TABLET BY MOUTH 3 TIMES DAILY AS NEEDED FOR MUSCLE SPASMS 180 tablet 1  . darifenacin (ENABLEX) 15 MG 24 hr tablet TAKE 1 TABLET BY MOUTH DAILY 90 tablet 1  . DEXILANT 60 MG capsule TAKE 1 CAPSULE BY MOUTH DAILY. 90 capsule 1  . FLUoxetine (PROZAC) 40 MG capsule TAKE 1 CAPSULE BY MOUTH DAILY. 90 capsule 1  . gabapentin (NEURONTIN) 300 MG capsule TAKE 1 CAPSULE FOUR TIMES DAILY. 360 capsule 0  . hydrOXYzine (VISTARIL) 25 MG capsule Take 1 capsule (25 mg total) by mouth at bedtime as needed. 30 capsule 0  . lisinopril (PRINIVIL,ZESTRIL) 20 MG tablet TAKE 1 TABLET BY MOUTH DAILY. 90 tablet 1  . ondansetron (ZOFRAN ODT) 4  MG disintegrating tablet Take 1 tablet (4 mg total) by mouth every 8 (eight) hours as needed for nausea or vomiting. 10 tablet 0  . oxyCODONE-acetaminophen (PERCOCET/ROXICET) 5-325 MG tablet Take 1 tablet by mouth every 8 (eight) hours as needed for moderate pain or severe pain. 30 tablet 0  . propranolol ER (INDERAL LA) 120 MG 24 hr capsule TAKE 1 CAPSULE BY MOUTH DAILY. 90 capsule 1  . ranitidine (ZANTAC) 150 MG tablet Take 1 tablet (150 mg total) by mouth at bedtime. 30 tablet 5  . topiramate (TOPAMAX) 50 MG tablet TAKE 1-2 TABLETS BY MOUTH NIGHTLY FOR HEADACHE PREVENTION 90 tablet 3   No current facility-administered medications for this visit.     Allergies as of 07/18/2016 - Review Complete 07/18/2016  Allergen Reaction Noted  . Sulfa antibiotics  01/25/2014    Family History  Problem Relation Age of  Onset  . Heart disease Mother   . Hypertension Mother   . Cancer Father   . Heart disease Father   . Hypertension Father   . Stroke Father     Social History   Social History  . Marital status: Married    Spouse name: N/A  . Number of children: N/A  . Years of education: N/A   Occupational History  . unemployed    Social History Main Topics  . Smoking status: Never Smoker  . Smokeless tobacco: Never Used  . Alcohol use No  . Drug use: Unknown  . Sexual activity: No   Other Topics Concern  . Not on file   Social History Narrative  . No narrative on file     Physical Exam: Ht 5' 3.39" (1.61 m) Comment: height measured without shoes  Wt 238 lb 2 oz (108 kg)   BMI 41.67 kg/m  Constitutional: generally well-appearing Psychiatric: alert and oriented x3 Eyes: extraocular movements intact Mouth: oral pharynx moist, no lesions Neck: supple no lymphadenopathy Cardiovascular: heart regular rate and rhythm Lungs: clear to auscultation bilaterally Abdomen: soft, nontender, nondistended, no obvious ascites, no peritoneal signs, normal bowel sounds Extremities: no lower extremity edema bilaterally Skin: no lesions on visible extremities   Assessment and plan: 60 y.o. female with  Nausea, intermittent vomiting, history of gastric ulcer first we will request records from her previous gastroenterologist in [facility] this will include colonoscopy and upper endoscopy. She has had a gastric ulcer in the past that sounds like it might have been NSAID related. Her symptoms currently are somewhat suggestive of recurrent ulcer. Gastroparesis could also contribute. She is on several medicines that can contribute to nausea as well. I recommended we proceed with upper endoscopy at her soonest convenience check for infection, ulcer, neoplasm which I think is unlikely. If this is negative then I would likely recommend gastric emptying scan.   Owens Loffler, MD [facility] 07/18/2016, 1:27 PM  Cc: Wendie Agreste, MD

## 2016-07-18 NOTE — Telephone Encounter (Signed)
Patient was just seen and states that there are 9 medication that were not sent it. Medications are:   Vistaril,zofran,bupropion,enablex,dexilant,prozac,lisinopril,propranolol, and flexeril.   [place]

## 2016-07-21 MED ORDER — LISINOPRIL 20 MG PO TABS: 20.0000 mg | ORAL_TABLET | Freq: Every day | ORAL | 1 refills | Status: DC

## 2016-07-21 MED ORDER — DEXLANSOPRAZOLE 60 MG PO CPDR: 1.0000 | DELAYED_RELEASE_CAPSULE | Freq: Every day | ORAL | 1 refills | Status: DC

## 2016-07-21 MED ORDER — DARIFENACIN HYDROBROMIDE ER 15 MG PO TB24: 15.0000 mg | ORAL_TABLET | Freq: Every day | ORAL | 1 refills | Status: DC

## 2016-07-21 MED ORDER — BUPROPION HCL ER (XL) 300 MG PO TB24: 300.0000 mg | ORAL_TABLET | Freq: Every day | ORAL | 1 refills | Status: DC

## 2016-07-21 MED ORDER — FLUOXETINE HCL 40 MG PO CAPS: 40.0000 mg | ORAL_CAPSULE | Freq: Every day | ORAL | 1 refills | Status: DC

## 2016-07-21 MED ORDER — HYDROXYZINE PAMOATE 25 MG PO CAPS: 25.0000 mg | ORAL_CAPSULE | Freq: Every evening | ORAL | 0 refills | Status: DC | PRN

## 2016-07-21 MED ORDER — ONDANSETRON 4 MG PO TBDP: 4.0000 mg | ORAL_TABLET | Freq: Three times a day (TID) | ORAL | 0 refills | Status: DC | PRN

## 2016-07-21 MED ORDER — PROPRANOLOL HCL ER 120 MG PO CP24: 120.0000 mg | ORAL_CAPSULE | Freq: Every day | ORAL | 1 refills | Status: DC

## 2016-07-21 MED FILL — FLUoxetine HCL 40 MG CAPS: 40 | 90 days supply | Qty: 90 | Fill #0

## 2016-07-21 MED FILL — PROPRANOLOL ER 120 MG CAP: 120 | 90 days supply | Qty: 90 | Fill #0

## 2016-07-21 MED FILL — BUPROPION HCL XL 300 MG TAB: 300 | 90 days supply | Qty: 90 | Fill #0

## 2016-07-21 MED FILL — LISINOPRIL 20 MG TABLET: 20 | 90 days supply | Qty: 90 | Fill #0

## 2016-07-21 MED FILL — DEXILANT DR 60 MG CAPSULE: 60 | 90 days supply | Qty: 90 | Fill #0

## 2016-07-21 MED FILL — ONDANSETRON ODT 4 MG TABLET: 4 | 7 days supply | Qty: 20 | Fill #0

## 2016-07-21 MED FILL — DARIFENACIN ER 15 MG TABLET: 15 | 90 days supply | Qty: 90 | Fill #0

## 2016-07-21 MED FILL — HYDROXYZINE PAM 25 MG CAP: 25 | 30 days supply | Qty: 30 | Fill #0

## 2016-08-07 ENCOUNTER — Telehealth: Payer: Self-pay | Admitting: Gastroenterology

## 2016-08-07 NOTE — Telephone Encounter (Signed)
Rec'd from Dr.Peters forward 49 pages to Dr. Owens Loffler

## 2016-08-08 ENCOUNTER — Telehealth: Payer: Self-pay | Admitting: Gastroenterology

## 2016-08-08 NOTE — Telephone Encounter (Signed)
I reviewed a very large packet sent by from [facility].  None of the pages contain the records that we requested however.  There were 20-30 pages of lab results, med lists, lengthy and irelevent lists of data.  Can you please ask again that Dr. Harrell Lark office send her colonoscopy/EGD reports and any associated pathology.  I don't need any other records except those.

## 2016-08-12 ENCOUNTER — Ambulatory Visit (INDEPENDENT_AMBULATORY_CARE_PROVIDER_SITE_OTHER): Payer: 59 | Admitting: Family Medicine

## 2016-08-12 ENCOUNTER — Encounter: Payer: Self-pay | Admitting: Family Medicine

## 2016-08-12 VITALS — BP 130/86 | HR 65 | Temp 98.5°F | Resp 16 | Ht 64.0 in | Wt 236.2 lb

## 2016-08-12 DIAGNOSIS — E559 Vitamin D deficiency, unspecified: Secondary | ICD-10-CM | POA: Diagnosis not present

## 2016-08-12 DIAGNOSIS — Z114 Encounter for screening for human immunodeficiency virus [HIV]: Secondary | ICD-10-CM | POA: Diagnosis not present

## 2016-08-12 DIAGNOSIS — K219 Gastro-esophageal reflux disease without esophagitis: Secondary | ICD-10-CM

## 2016-08-12 DIAGNOSIS — M797 Fibromyalgia: Secondary | ICD-10-CM

## 2016-08-12 DIAGNOSIS — G4733 Obstructive sleep apnea (adult) (pediatric): Secondary | ICD-10-CM | POA: Diagnosis not present

## 2016-08-12 DIAGNOSIS — G894 Chronic pain syndrome: Secondary | ICD-10-CM

## 2016-08-12 DIAGNOSIS — Z1159 Encounter for screening for other viral diseases: Secondary | ICD-10-CM | POA: Diagnosis not present

## 2016-08-12 DIAGNOSIS — E7800 Pure hypercholesterolemia, unspecified: Secondary | ICD-10-CM | POA: Diagnosis not present

## 2016-08-12 DIAGNOSIS — I10 Essential (primary) hypertension: Secondary | ICD-10-CM

## 2016-08-12 DIAGNOSIS — Z8669 Personal history of other diseases of the nervous system and sense organs: Secondary | ICD-10-CM

## 2016-08-12 DIAGNOSIS — F4323 Adjustment disorder with mixed anxiety and depressed mood: Secondary | ICD-10-CM | POA: Diagnosis not present

## 2016-08-12 MED ORDER — OXYCODONE-ACETAMINOPHEN 5-325 MG PO TABS: 1.0000 | ORAL_TABLET | Freq: Three times a day (TID) | ORAL | 0 refills | Status: DC | PRN

## 2016-08-12 NOTE — Patient Instructions (Addendum)
Increase Gabapentin 300mg  to TWO tablets three to four times per day.    IF you received an x-ray today, you will receive an invoice from [facility]. Please contact [facility] at [phone] with questions or concerns regarding your invoice.   IF you received labwork today, you will receive an invoice from [facility]. Please contact [facility] at [phone] with questions or concerns regarding your invoice.   Our billing staff will not be able to assist you with questions regarding bills from these companies.  You will be contacted with the lab results as soon as they are available. The fastest way to get your results is to activate your My Chart account. Instructions are located on the last page of this paperwork. If you have not heard from [place] regarding the results in 2 weeks, please contact this office.

## 2016-08-12 NOTE — Progress Notes (Signed)
Subjective:    Patient ID: [name], female    DOB: [birth year], 61 y.o.   MRN: [id number]  08/12/2016  Follow-up (3 mos, bloodwork) and Medication Refill (Oxycodone-Ace 5-325 mg)   HPI This 61 y.o. female presents for one month follow-up of  DDD cervical spine, anxiety/depression, migraines.  Started Atorvastatin 10mg  daily for hypercholesterolemia; increased Gabapentin to 300mg  qid with plans to increase at each visit. Also started MVI and Vitamin D supplement at last visit.  No side effects to Atorvastatin.  Tries to remember all four Gabapentin in during the day.  With cold weather, R arm has really been hurting; numbness and tingling is persistent; numbness and tingling.  Still having moderate amount of pain during the day; sleeping through the night.  Pain does not keep pt up.  Still having moderate pain during the day.  Does get nervous and anxiety; more nervous than depressed.    Not as depressed as last month; not sure why feeling better.  Made Richardson Landry sit down and listen the other day.  Then got all medications back on board.    Out of narcotic medication; last filled 04/2016.    Nausea with vomiting: not vomiting; feels sick.   Having nausea 3 times per week.  Suggested might be some medication.  Has been taking pain medication since 2015.  Nausea started in past 3-4 months ago.  Scheduled for EGD three or four months ago.    Immunization History  Administered Date(s) Administered  . Influenza,inj,Quad PF,36+ Mos 05/28/2015, 05/21/2016  . Tdap 06/25/2015   BP Readings from Last 3 Encounters:  08/29/16 131/74  08/12/16 130/86  07/18/16 136/90   Wt Readings from Last 3 Encounters:  08/29/16 236 lb (107 kg)  08/12/16 236 lb 3.2 oz (107.1 kg)  07/18/16 238 lb 2 oz (108 kg)    Review of Systems  Constitutional: Negative for chills, diaphoresis, fatigue and fever.  Eyes: Negative for visual disturbance.  Respiratory: Negative for cough and shortness of breath.     Cardiovascular: Negative for chest pain, palpitations and leg swelling.  Gastrointestinal: Positive for nausea. Negative for abdominal pain, constipation, diarrhea and vomiting.  Endocrine: Negative for cold intolerance, heat intolerance, polydipsia, polyphagia and polyuria.  Musculoskeletal: Positive for arthralgias, myalgias, neck pain and neck stiffness.  Neurological: Negative for dizziness, tremors, seizures, syncope, facial asymmetry, speech difficulty, weakness, light-headedness, numbness and headaches.  Psychiatric/Behavioral: Positive for dysphoric mood. The patient is nervous/anxious.     Past Medical History:  Diagnosis Date  . Allergy    [name], Flonase  . Anemia   . Anxiety   . Arthritis   . Chronic kidney disease   . Colon polyps   . Depression   . Fibromyalgia   . GERD (gastroesophageal reflux disease)   . HLD (hyperlipidemia)   . Hypertension   . Migraine   . Neuropathy ([place])   . Osteoporosis   . Pneumonia   . Sleep apnea with use of continuous positive airway pressure (CPAP)   . Thyroid goiter    Past Surgical History:  Procedure Laterality Date  . PARTIAL HYSTERECTOMY    . ROTATOR CUFF REPAIR Right 2009  . SHOULDER SURGERY Right    shoulder dislocation, fall, and elbow unla nerve  . SHOULDER SURGERY Right 2015   labral tear  . ULNAR NERVE REPAIR Right 08/19/2014   Allergies  Allergen Reactions  . Sulfa Antibiotics     Social History   Social History  . Marital status: Married  Spouse name: N/A  . Number of children: 3  . Years of education: N/A   Occupational History  . unemployed    Social History Main Topics  . Smoking status: Former Smoker    Quit date: 08/04/1998  . Smokeless tobacco: Never Used  . Alcohol use No  . Drug use: No  . Sexual activity: No   Other Topics Concern  . Not on file   Social History Narrative  . No narrative on file   Family History  Problem Relation Age of Onset  . Heart disease Mother   .  Hypertension Mother   . Heart disease Father   . Hypertension Father   . Stroke Father   . Prostate cancer Father   . Hypertension Brother   . Melanoma Brother   . Cancer Maternal Grandmother     type unknown  . Cancer Maternal Grandfather     type unknown  . Heart disease Paternal Grandmother   . Heart defect Paternal Grandfather   . Melanoma Brother   . Hypertension Brother   . Hyperlipidemia Brother        Objective:    BP 130/86   Pulse 65   Temp 98.5 F (36.9 C) (Oral)   Resp 16   Ht 5\' 4"  (1.626 m)   Wt 236 lb 3.2 oz (107.1 kg)   SpO2 97%   BMI 40.54 kg/m  Physical Exam  Constitutional: She is oriented to person, place, and time. She appears well-developed and well-nourished. No distress.  HENT:  Head: Normocephalic and atraumatic.  Right Ear: External ear normal.  Left Ear: External ear normal.  Nose: Nose normal.  Mouth/Throat: Oropharynx is clear and moist.  Eyes: Conjunctivae and EOM are normal. Pupils are equal, round, and reactive to light.  Neck: Normal range of motion. Neck supple. Carotid bruit is not present. No thyromegaly present.  Cardiovascular: Normal rate, regular rhythm, normal heart sounds and intact distal pulses.  Exam reveals no gallop and no friction rub.   No murmur heard. Pulmonary/Chest: Effort normal and breath sounds normal. She has no wheezes. She has no rales.  Abdominal: Soft. Bowel sounds are normal. She exhibits no distension and no mass. There is no tenderness. There is no rebound and no guarding.  Lymphadenopathy:    She has no cervical adenopathy.  Neurological: She is alert and oriented to person, place, and time. No cranial nerve deficit.  Skin: Skin is warm and dry. No rash noted. She is not diaphoretic. No erythema. No pallor.  Psychiatric: She has a normal mood and affect. Her behavior is normal.   Results for orders placed or performed in visit on 08/12/16  CBC with Differential/Platelet  Result Value Ref Range   WBC  5.7 3.4 - 10.8 x10E3/uL   RBC 4.43 3.77 - 5.28 x10E6/uL   Hemoglobin 12.9 11.1 - 15.9 g/dL   Hematocrit 39.1 34.0 - 46.6 %   MCV 88 79 - 97 fL   MCH 29.1 26.6 - 33.0 pg   MCHC 33.0 31.5 - 35.7 g/dL   RDW 13.6 12.3 - 15.4 %   Platelets 251 150 - 379 x10E3/uL   Neutrophils 71 Not Estab. %   Lymphs 22 Not Estab. %   Monocytes 5 Not Estab. %   Eos 2 Not Estab. %   Basos 0 Not Estab. %   Neutrophils Absolute 4.0 1.4 - 7.0 x10E3/uL   Lymphocytes Absolute 1.2 0.7 - 3.1 x10E3/uL   Monocytes Absolute 0.3 0.1 -  0.9 x10E3/uL   EOS (ABSOLUTE) 0.1 0.0 - 0.4 x10E3/uL   Basophils Absolute 0.0 0.0 - 0.2 x10E3/uL   Immature Granulocytes 0 Not Estab. %   Immature Grans (Abs) 0.0 0.0 - 0.1 x10E3/uL  Comprehensive metabolic panel  Result Value Ref Range   Glucose 100 (H) 65 - 99 mg/dL   BUN 8 8 - 27 mg/dL   Creatinine, Ser 0.89 0.57 - 1.00 mg/dL   GFR calc non Af Amer 71 >59 mL/min/1.73   GFR calc Af Amer 81 >59 mL/min/1.73   BUN/Creatinine Ratio 9 (L) 12 - 28   Sodium 143 134 - 144 mmol/L   Potassium 4.4 3.5 - 5.2 mmol/L   Chloride 107 (H) 96 - 106 mmol/L   CO2 20 18 - 29 mmol/L   Calcium 9.1 8.7 - 10.3 mg/dL   Total Protein 7.1 6.0 - 8.5 g/dL   Albumin 4.4 3.6 - 4.8 g/dL   Globulin, Total 2.7 1.5 - 4.5 g/dL   Albumin/Globulin Ratio 1.6 1.2 - 2.2   Bilirubin Total 0.3 0.0 - 1.2 mg/dL   Alkaline Phosphatase 86 39 - 117 IU/L   AST 17 0 - 40 IU/L   ALT 12 0 - 32 IU/L  Lipid panel  Result Value Ref Range   Cholesterol, Total 188 100 - 199 mg/dL   Triglycerides 118 0 - 149 mg/dL   HDL 71 >39 mg/dL   VLDL Cholesterol Cal 24 5 - 40 mg/dL   LDL Calculated 93 0 - 99 mg/dL   Chol/HDL Ratio 2.6 0.0 - 4.4 ratio units  VITAMIN D 25 Hydroxy (Vit-D Deficiency, Fractures)  Result Value Ref Range   Vit D, 25-Hydroxy 25.3 (L) 30.0 - 100.0 ng/mL  HIV antibody  Result Value Ref Range   HIV Screen 4th Generation wRfx Non Reactive Non Reactive  Hepatitis C antibody  Result Value Ref Range   Hep C  Virus Ab <0.1 0.0 - 0.9 s/co ratio       Depression screen [facility] 2/9 08/12/2016 07/15/2016 05/29/2016 05/21/2016 04/22/2016  Decreased Interest 0 3 3 0 0  Down, Depressed, Hopeless 0 3 1 0 0  PHQ - 2 Score 0 6 4 0 0  Altered sleeping 0 1 3 - -  Tired, decreased energy 0 3 3 - -  Change in appetite 0 2 3 - -  Feeling bad or failure about yourself  0 3 1 - -  Trouble concentrating 0 2 3 - -  Moving slowly or fidgety/restless 0 2 0 - -  Suicidal thoughts 0 0 0 - -  PHQ-9 Score 0 19 17 - -  Difficult doing work/chores - Somewhat difficult Somewhat difficult - -    Assessment & Plan:   1. Pure hypercholesterolemia   2. Screening for HIV (human immunodeficiency virus)   3. Need for hepatitis C screening test   4. Vitamin D deficiency   5. Essential hypertension   6. OSA (obstructive sleep apnea)   7. Chronic pain syndrome   8. Fibromyalgia   9. Adjustment disorder with mixed anxiety and depressed mood   10. Hx of migraine headaches   11. Gastroesophageal reflux disease without esophagitis    -increase Gabapentin 300mg  to 2 tablets three or four times daily. -refill of Percocet provided. -tolerating statin; obtain labs. -repeat vitamin D level.   Orders Placed This Encounter  Procedures  . CBC with Differential/Platelet  . Comprehensive metabolic panel    Order Specific Question:   Has the patient fasted?  Answer:   Yes  . Lipid panel    Order Specific Question:   Has the patient fasted?    Answer:   Yes  . VITAMIN D 25 Hydroxy (Vit-D Deficiency, Fractures)  . HIV antibody  . Hepatitis C antibody  . Care order/instruction:    Please recheck BP.  . Care order/instruction:    AVS printed - let patient go!   Meds ordered this encounter  Medications  . oxyCODONE-acetaminophen (PERCOCET/ROXICET) 5-325 MG tablet    Sig: Take 1 tablet by mouth every 8 (eight) hours as needed for moderate pain or severe pain.    Dispense:  30 tablet    Refill:  0    Return in about 2  months (around 10/10/2016) for recheck chronic pain, nausea.   Fletcher Rathbun Elayne Guerin, M.D. [facility] [address] [place], [place]  [address] [phone] phone [phone] fax

## 2016-08-13 LAB — COMPREHENSIVE METABOLIC PANEL
A/G RATIO: 1.6 (ref 1.2–2.2)
ALT: 12 IU/L (ref 0–32)
AST: 17 IU/L (ref 0–40)
Albumin: 4.4 g/dL (ref 3.6–4.8)
Alkaline Phosphatase: 86 IU/L (ref 39–117)
BILIRUBIN TOTAL: 0.3 mg/dL (ref 0.0–1.2)
BUN/Creatinine Ratio: 9 — ABNORMAL LOW (ref 12–28)
BUN: 8 mg/dL (ref 8–27)
CALCIUM: 9.1 mg/dL (ref 8.7–10.3)
CHLORIDE: 107 mmol/L — AB (ref 96–106)
CO2: 20 mmol/L (ref 18–29)
Creatinine, Ser: 0.89 mg/dL (ref 0.57–1.00)
GFR, EST AFRICAN AMERICAN: 81 mL/min/{1.73_m2} (ref >59)
GFR, EST NON AFRICAN AMERICAN: 71 mL/min/{1.73_m2} (ref >59)
GLOBULIN, TOTAL: 2.7 g/dL (ref 1.5–4.5)
Glucose: 100 mg/dL — ABNORMAL HIGH (ref 65–99)
POTASSIUM: 4.4 mmol/L (ref 3.5–5.2)
SODIUM: 143 mmol/L (ref 134–144)
TOTAL PROTEIN: 7.1 g/dL (ref 6.0–8.5)

## 2016-08-13 LAB — LIPID PANEL
CHOL/HDL RATIO: 2.6 ratio (ref 0.0–4.4)
Cholesterol, Total: 188 mg/dL (ref 100–199)
HDL: 71 mg/dL (ref >39)
LDL Calculated: 93 mg/dL (ref 0–99)
Triglycerides: 118 mg/dL (ref 0–149)
VLDL Cholesterol Cal: 24 mg/dL (ref 5–40)

## 2016-08-13 LAB — CBC WITH DIFFERENTIAL/PLATELET
BASOS: 0 %
Basophils Absolute: 0 10*3/uL (ref 0.0–0.2)
EOS (ABSOLUTE): 0.1 10*3/uL (ref 0.0–0.4)
EOS: 2 %
HEMATOCRIT: 39.1 % (ref 34.0–46.6)
Hemoglobin: 12.9 g/dL (ref 11.1–15.9)
Immature Grans (Abs): 0 10*3/uL (ref 0.0–0.1)
Immature Granulocytes: 0 %
Lymphocytes Absolute: 1.2 10*3/uL (ref 0.7–3.1)
Lymphs: 22 %
MCH: 29.1 pg (ref 26.6–33.0)
MCHC: 33 g/dL (ref 31.5–35.7)
MCV: 88 fL (ref 79–97)
MONOS ABS: 0.3 10*3/uL (ref 0.1–0.9)
Monocytes: 5 %
NEUTROS ABS: 4 10*3/uL (ref 1.4–7.0)
Neutrophils: 71 %
PLATELETS: 251 10*3/uL (ref 150–379)
RBC: 4.43 x10E6/uL (ref 3.77–5.28)
RDW: 13.6 % (ref 12.3–15.4)
WBC: 5.7 10*3/uL (ref 3.4–10.8)

## 2016-08-13 LAB — VITAMIN D 25 HYDROXY (VIT D DEFICIENCY, FRACTURES): VIT D 25 HYDROXY: 25.3 ng/mL — AB (ref 30.0–100.0)

## 2016-08-13 LAB — HIV ANTIBODY (ROUTINE TESTING W REFLEX): HIV Screen 4th Generation wRfx: NONREACTIVE

## 2016-08-13 LAB — HEPATITIS C ANTIBODY

## 2016-08-13 MED FILL — OXYCODONE W/APAP 5/325 TAB: 5-325 | 10 days supply | Qty: 30 | Fill #0

## 2016-08-13 NOTE — Telephone Encounter (Signed)
I have requested endo/colon and pathology reports from Dr Ferdinand Lango office. They state they will send them over to [place].

## 2016-08-13 NOTE — Telephone Encounter (Signed)
Procedure and pathology notes from Dr Ferdinand Lango have been placed on Dr Ardis Hughs desk for review.

## 2016-08-15 ENCOUNTER — Telehealth: Payer: Self-pay | Admitting: Gastroenterology

## 2016-08-15 NOTE — Telephone Encounter (Signed)
I have spoken to patient to advise that Dr Ardis Hughs has reviewed records and would like her to keep endoscopy that she is already scheduled for. Also advised that she would be due for colonoscopy in August 2021 and that we would send a letter when it gets closer to that time. She verbalizes understanding. Recall entered in [facility] for colonoscopy August 2021.

## 2016-08-15 NOTE — Telephone Encounter (Signed)
Colonoscopy October 2011, Dr. Ferdinand Lango, indications "colon cancer screening, constipation, abdominal pain". Findings to subcentimeter polyps were removed. The prep was adequate. Colon was otherwise normal. Pathology report shows the polyps were both hyperplastic and Dr. Ferdinand Lango recommended repeat colonoscopy at 10 year interval.  Upper endoscopy August 2011, Dr. Ferdinand Lango, done for "abdominal pain and GERD". Findings gastritis, esophagitis, 15 mm antral ulcer. Multiple biopsies were taken. Biopsies showed no H. pylori. No intestinal metaplasia. He recommended Carafate and repeat upper endoscopy in 2 months.  Upper endoscopy October 2011, Dr. Ferdinand Lango, done for "gastric ulcer follow-up, abdominal pain". Findings 7 mm ulcer in the antrum persists, this was smaller than it was previously measured. Multiple repeat biopsies were taken. No H. pylori was noted. No intestinal metaplasia. He again recommended Carafate and again recommended repeat EGD in 2 months.  Upper endoscopy December 2011, Dr. Ferdinand Lango, done for "gastric ulcer follow-up and GERD". Findings gastritis, healed ulcer. He again took multiple biopsies and showed no H. pylori and no Barrett's esophagus.    If you can please get in touch with her and let her know that I reviewed Dr. Ferdinand Lango upper endoscopies and colonoscopy. She needs recall colonoscopy August 2021 for routine risk colon cancer screening. We should continue with her current plans for upper endoscopy later this month. She is already scheduled for that.

## 2016-08-21 ENCOUNTER — Encounter: Payer: Self-pay | Admitting: Gastroenterology

## 2016-08-29 ENCOUNTER — Ambulatory Visit (AMBULATORY_SURGERY_CENTER): Payer: 59 | Admitting: Gastroenterology

## 2016-08-29 ENCOUNTER — Encounter: Payer: Self-pay | Admitting: Gastroenterology

## 2016-08-29 VITALS — BP 131/74 | HR 62 | Temp 97.1°F | Resp 16 | Ht 64.0 in | Wt 236.0 lb

## 2016-08-29 DIAGNOSIS — K2970 Gastritis, unspecified, without bleeding: Secondary | ICD-10-CM | POA: Diagnosis not present

## 2016-08-29 DIAGNOSIS — G8929 Other chronic pain: Secondary | ICD-10-CM | POA: Diagnosis not present

## 2016-08-29 DIAGNOSIS — G43A0 Cyclical vomiting, not intractable: Secondary | ICD-10-CM | POA: Diagnosis present

## 2016-08-29 DIAGNOSIS — G4733 Obstructive sleep apnea (adult) (pediatric): Secondary | ICD-10-CM | POA: Diagnosis not present

## 2016-08-29 DIAGNOSIS — K2990 Gastroduodenitis, unspecified, without bleeding: Secondary | ICD-10-CM | POA: Diagnosis not present

## 2016-08-29 DIAGNOSIS — I10 Essential (primary) hypertension: Secondary | ICD-10-CM | POA: Diagnosis not present

## 2016-08-29 DIAGNOSIS — K449 Diaphragmatic hernia without obstruction or gangrene: Secondary | ICD-10-CM

## 2016-08-29 DIAGNOSIS — M797 Fibromyalgia: Secondary | ICD-10-CM | POA: Diagnosis not present

## 2016-08-29 DIAGNOSIS — R112 Nausea with vomiting, unspecified: Secondary | ICD-10-CM | POA: Diagnosis not present

## 2016-08-29 DIAGNOSIS — K219 Gastro-esophageal reflux disease without esophagitis: Secondary | ICD-10-CM | POA: Diagnosis not present

## 2016-08-29 MED ORDER — SODIUM CHLORIDE 0.9 % IV SOLN: 500.0000 mL | INTRAVENOUS | Status: DC

## 2016-08-29 NOTE — Op Note (Signed)
[facility] Patient Name: [name] Procedure Date: 08/29/2016 9:24 AM MRN: [id number] Endoscopist: Milus Banister , MD Age: 61 Referring MD:  Date of Birth: 12-28-1955 Gender: Female Account #: [id number] Procedure:                Upper GI endoscopy Indications:              Nausea; remote antral ulcer (2011 EGDs Dr. Harrell Lark) Medicines:                Monitored Anesthesia Care Procedure:                Pre-Anesthesia Assessment:                           - Prior to the procedure, a History and Physical                            was performed, and patient medications and                            allergies were reviewed. The patient's tolerance of                            previous anesthesia was also reviewed. The risks                            and benefits of the procedure and the sedation                            options and risks were discussed with the patient.                            All questions were answered, and informed consent                            was obtained. Prior Anticoagulants: The patient has                            taken no previous anticoagulant or antiplatelet                            agents. ASA Grade Assessment: III - A patient with                            severe systemic disease. After reviewing the risks                            and benefits, the patient was deemed in                            satisfactory condition to undergo the procedure.  After obtaining informed consent, the endoscope was                            passed under direct vision. Throughout the                            procedure, the patient's blood pressure, pulse, and                            oxygen saturations were monitored continuously. The                            Model GIF-HQ190 [id number]) scope was introduced                            through the mouth, and advanced to the  second part                            of duodenum. The upper GI endoscopy was                            accomplished without difficulty. The patient                            tolerated the procedure well. Scope In: Scope Out: Findings:                 The esophagus was normal.                           A large hiatal hernia was present.                           Mild inflammation characterized by erythema and                            friability was found in the gastric antrum.                            Biopsies were taken with a cold forceps for                            histology.                           The examined duodenum was normal. Complications:            No immediate complications. Estimated blood loss:                            None. Estimated Blood Loss:     Estimated blood loss: none. Impression:               - Normal esophagus.                           - Large hiatal hernia.                           -  Gastritis. Biopsied to check for H. pylori.                           - Normal examined duodenum. Recommendation:           - Patient has a contact number available for                            emergencies. The signs and symptoms of potential                            delayed complications were discussed with the                            patient. Return to normal activities tomorrow.                            Written discharge instructions were provided to the                            patient.                           - Resume previous diet.                           - Continue present medications. Please start taking                            your zofran (ondansetron) 4mg  pill every morning                            shortly after waking                           - Await pathology results, if biopsies show H.                            pylori you will be started on appropriate                            antibiotics.                           - Your  weight (BMI 41) and some of your meds may                            contribute to your nausea (pain meds, wellbutrin                            and prozac). Milus Banister, MD 08/29/2016 9:43:31 AM This report has been signed electronically.

## 2016-08-29 NOTE — Progress Notes (Signed)
Called to room to assist during endoscopic procedure.  Patient ID and intended procedure confirmed with present staff. Received instructions for my participation in the procedure from the performing physician.  

## 2016-08-29 NOTE — Progress Notes (Signed)
Report to PACU, RN, vss, BBS= Clear.  

## 2016-08-29 NOTE — Patient Instructions (Signed)
YOU HAD AN ENDOSCOPIC PROCEDURE TODAY AT [facility]:   Refer to the procedure report that was given to you for any specific questions about what was found during the examination.  If the procedure report does not answer your questions, please call your gastroenterologist to clarify.  If you requested that your care partner not be given the details of your procedure findings, then the procedure report has been included in a sealed envelope for you to review at your convenience later.  YOU SHOULD EXPECT: Some feelings of bloating in the abdomen. Passage of more gas than usual.  Walking can help get rid of the air that was put into your GI tract during the procedure and reduce the bloating. Please Note:  You might notice some irritation and congestion in your nose or some drainage.  This is from the oxygen used during your procedure.  There is no need for concern and it should clear up in a day or so.  SYMPTOMS TO REPORT IMMEDIATELY:   Following upper endoscopy (EGD)  Vomiting of blood or coffee ground material  New chest pain or pain under the shoulder blades  Painful or persistently difficult swallowing  New shortness of breath  Fever of 100F or higher  Black, tarry-looking stools  For urgent or emergent issues, a gastroenterologist can be reached at any hour by calling [phone].   DIET:  We do recommend a small meal at first, but then you may proceed to your regular diet.  Drink plenty of fluids but you should avoid alcoholic beverages for 24 hours.  ACTIVITY:  You should plan to take it easy for the rest of today and you should NOT DRIVE or use heavy machinery until tomorrow (because of the sedation medicines used during the test).    FOLLOW UP: Our staff will call the number listed on your records the next business day following your procedure to check on you and address any questions or concerns that you may have regarding the information given to you following  your procedure. If we do not reach you, we will leave a message.  However, if you are feeling well and you are not experiencing any problems, there is no need to return our call.  We will assume that you have returned to your regular daily activities without incident.  If any biopsies were taken you will be contacted by phone or by letter within the next 1-3 weeks.  Please call us at [phone] if you have not heard about the biopsies in 3 weeks.   SIGNATURES/CONFIDENTIALITY: You and/or your care partner have signed paperwork which will be entered into your electronic medical record.  These signatures attest to the fact that that the information above on your After Visit Summary has been reviewed and is understood.  Full responsibility of the confidentiality of this discharge information lies with you and/or your care-partner.  Please await pathology    Continue your normal medications; please start taking your Zofran 4 mg tablet every morning shortly after waking

## 2016-09-01 ENCOUNTER — Other Ambulatory Visit: Payer: Self-pay

## 2016-09-01 ENCOUNTER — Telehealth: Payer: Self-pay

## 2016-09-01 MED ORDER — ONDANSETRON 4 MG PO TBDP: 4.0000 mg | ORAL_TABLET | Freq: Three times a day (TID) | ORAL | 0 refills | Status: DC | PRN

## 2016-09-01 MED FILL — CYCLOBENZAPRINE 10 MG TAB: 10 | 60 days supply | Qty: 180 | Fill #1

## 2016-09-01 NOTE — Telephone Encounter (Signed)
  Follow up Call-  Call back number 08/29/2016  Post procedure Call Back phone  # [phone]  Permission to leave phone message Yes  Some recent data might be hidden     Patient questions:  Do you have a fever, pain , or abdominal swelling? No. Pain Score  0 *  Have you tolerated food without any problems? Yes.    Have you been able to return to your normal activities? Yes.    Do you have any questions about your discharge instructions: Diet   No. Medications  No. Follow up visit  No.  Do you have questions or concerns about your Care? Yes.    Actions: * If pain score is 4 or above: No action needed, pain <4.  Pt still c/o of nausea.  She has been eating through the weekend.  Advised pt to call us back if needed.  Dr. Ardis Hughs took bx, awaiting bx results. maw

## 2016-09-01 NOTE — Telephone Encounter (Signed)
08/12/16 last ov and refill 

## 2016-09-02 MED FILL — ONDANSETRON ODT 4 MG TABLET: 4 | 7 days supply | Qty: 20 | Fill #0

## 2016-09-05 ENCOUNTER — Encounter: Payer: Self-pay | Admitting: Gastroenterology

## 2016-09-22 ENCOUNTER — Other Ambulatory Visit: Payer: Self-pay | Admitting: Family Medicine

## 2016-09-22 NOTE — Telephone Encounter (Signed)
08/2016 last ov and refill

## 2016-09-23 MED FILL — ONDANSETRON ODT 4 MG TABLET: 4 | 7 days supply | Qty: 20 | Fill #0

## 2016-10-14 ENCOUNTER — Ambulatory Visit (INDEPENDENT_AMBULATORY_CARE_PROVIDER_SITE_OTHER): Payer: 59 | Admitting: Family Medicine

## 2016-10-14 ENCOUNTER — Encounter: Payer: Self-pay | Admitting: Family Medicine

## 2016-10-14 VITALS — BP 139/83 | HR 65 | Temp 98.7°F | Resp 18 | Ht 64.0 in | Wt 246.0 lb

## 2016-10-14 DIAGNOSIS — K449 Diaphragmatic hernia without obstruction or gangrene: Secondary | ICD-10-CM | POA: Diagnosis not present

## 2016-10-14 DIAGNOSIS — K219 Gastro-esophageal reflux disease without esophagitis: Secondary | ICD-10-CM | POA: Diagnosis not present

## 2016-10-14 DIAGNOSIS — K2990 Gastroduodenitis, unspecified, without bleeding: Secondary | ICD-10-CM | POA: Diagnosis not present

## 2016-10-14 DIAGNOSIS — M797 Fibromyalgia: Secondary | ICD-10-CM | POA: Diagnosis not present

## 2016-10-14 DIAGNOSIS — G894 Chronic pain syndrome: Secondary | ICD-10-CM

## 2016-10-14 DIAGNOSIS — E7800 Pure hypercholesterolemia, unspecified: Secondary | ICD-10-CM

## 2016-10-14 DIAGNOSIS — R110 Nausea: Secondary | ICD-10-CM | POA: Diagnosis not present

## 2016-10-14 DIAGNOSIS — F4323 Adjustment disorder with mixed anxiety and depressed mood: Secondary | ICD-10-CM | POA: Diagnosis not present

## 2016-10-14 DIAGNOSIS — K2970 Gastritis, unspecified, without bleeding: Secondary | ICD-10-CM

## 2016-10-14 MED ORDER — ONDANSETRON 4 MG PO TBDP: ORAL_TABLET | ORAL | 4 refills | Status: DC

## 2016-10-14 MED ORDER — BUPROPION HCL ER (XL) 150 MG PO TB24: 150.0000 mg | ORAL_TABLET | Freq: Every day | ORAL | 1 refills | Status: DC

## 2016-10-14 MED ORDER — OXYCODONE-ACETAMINOPHEN 5-325 MG PO TABS: 1.0000 | ORAL_TABLET | Freq: Three times a day (TID) | ORAL | 0 refills | Status: DC | PRN

## 2016-10-14 MED FILL — BUPROPION HCL XL 150 MG TAB: 150 | 90 days supply | Qty: 90 | Fill #0

## 2016-10-14 MED FILL — ONDANSETRON ODT 4 MG TABLET: 4 | 7 days supply | Qty: 20 | Fill #0

## 2016-10-14 MED FILL — OXYCODONE W/APAP 5/325 TAB: 5-325 | 10 days supply | Qty: 30 | Fill #0

## 2016-10-14 NOTE — Patient Instructions (Addendum)
Decrease Wellbutrin to 150mg  daily.   Fat and Cholesterol Restricted Diet Getting too much fat and cholesterol in your diet may cause health problems. Following this diet helps keep your fat and cholesterol at normal levels. This can keep you from getting sick. What types of fat should I choose?  Choose monosaturated and polyunsaturated fats. These are found in foods such as olive oil, canola oil, flaxseeds, walnuts, almonds, and seeds.  Eat more omega-3 fats. Good choices include salmon, mackerel, sardines, tuna, flaxseed oil, and ground flaxseeds.  Limit saturated fats. These are in animal products such as meats, butter, and cream. They can also be in plant products such as palm oil, palm kernel oil, and coconut oil.  Avoid foods with partially hydrogenated oils in them. These contain trans fats. Examples of foods that have trans fats are stick margarine, some tub margarines, cookies, crackers, and other baked goods. What general guidelines do I need to follow?  Check food labels. Look for the words "trans fat" and "saturated fat."  When preparing a meal:  Fill half of your plate with vegetables and green salads.  Fill one fourth of your plate with whole grains. Look for the word "whole" as the first word in the ingredient list.  Fill one fourth of your plate with lean protein foods.  Eat more foods that have fiber, like apples, carrots, beans, peas, and barley.  Eat more home-cooked foods. Eat less at restaurants and buffets.  Limit or avoid alcohol.  Limit foods high in starch and sugar.  Limit fried foods.  Cook foods without frying them. Baking, boiling, grilling, and broiling are all great options.  Lose weight if you are overweight. Losing even a small amount of weight can help your overall health. It can also help prevent diseases such as diabetes and heart disease. What foods can I eat? Grains  Whole grains, such as whole wheat or whole grain breads, crackers,  cereals, and pasta. Unsweetened oatmeal, bulgur, barley, quinoa, or brown rice. Corn or whole wheat flour tortillas. Vegetables  Fresh or frozen vegetables (raw, steamed, roasted, or grilled). Green salads. Fruits  All fresh, canned (in natural juice), or frozen fruits. Meat and Other Protein Products  Ground beef (85% or leaner), grass-fed beef, or beef trimmed of fat. Skinless chicken or [place]. Ground chicken or [place]. Pork trimmed of fat. All fish and seafood. Eggs. Dried beans, peas, or lentils. Unsalted nuts or seeds. Unsalted canned or dry beans. Dairy  Low-fat dairy products, such as skim or 1% milk, 2% or reduced-fat cheeses, low-fat ricotta or cottage cheese, or plain low-fat yogurt. Fats and Oils  Tub margarines without trans fats. Light or reduced-fat mayonnaise and salad dressings. Avocado. Olive, canola, sesame, or safflower oils. Natural peanut or almond butter (choose ones without added sugar and oil). The items listed above may not be a complete list of recommended foods or beverages. Contact your dietitian for more options.  What foods are not recommended? Grains  White bread. White pasta. White rice. Cornbread. Bagels, pastries, and croissants. Crackers that contain trans fat. Vegetables  White potatoes. Corn. Creamed or fried vegetables. Vegetables in a cheese sauce. Fruits  Dried fruits. Canned fruit in light or heavy syrup. Fruit juice. Meat and Other Protein Products  Fatty cuts of meat. Ribs, chicken wings, bacon, sausage, bologna, salami, chitterlings, fatback, hot dogs, bratwurst, and packaged luncheon meats. Liver and organ meats. Dairy  Whole or 2% milk, cream, half-and-half, and cream cheese. Whole milk cheeses. Whole-fat or  sweetened yogurt. Full-fat cheeses. Nondairy creamers and whipped toppings. Processed cheese, cheese spreads, or cheese curds. Sweets and Desserts  Corn syrup, sugars, honey, and molasses. Candy. Jam and jelly. Syrup. Sweetened cereals.  Cookies, pies, cakes, donuts, muffins, and ice cream. Fats and Oils  Butter, stick margarine, lard, shortening, ghee, or bacon fat. Coconut, palm kernel, or palm oils. Beverages  Alcohol. Sweetened drinks (such as sodas, lemonade, and fruit drinks or punches). The items listed above may not be a complete list of foods and beverages to avoid. Contact your dietitian for more information.  This information is not intended to replace advice given to you by your health care provider. Make sure you discuss any questions you have with your health care provider. Document Released: 01/20/2012 Document Revised: 03/27/2016 Document Reviewed: 10/20/2013 Elsevier Interactive Patient Education  2017 [employer].   IF you received an x-ray today, you will receive an invoice from [facility]. Please contact [facility] at [phone] with questions or concerns regarding your invoice.   IF you received labwork today, you will receive an invoice from [place]. Please contact [facility] at [phone] with questions or concerns regarding your invoice.   Our billing staff will not be able to assist you with questions regarding bills from these companies.  You will be contacted with the lab results as soon as they are available. The fastest way to get your results is to activate your My Chart account. Instructions are located on the last page of this paperwork. If you have not heard from [place] regarding the results in 2 weeks, please contact this office.

## 2016-10-14 NOTE — Progress Notes (Signed)
Subjective:    Patient ID: [name], female    DOB: September 30, 1955, 61 y.o.   MRN: [id number]  10/14/2016  Follow-up (2 month. Pure hypercholesterolemia)   HPI This 61 y.o. female presents for two month follow-up depression/anxiety, chronic pain.  Feeling depressed.  Cannot take Gabapentin 3 four times daily due to sedation; cut back to 3 tid.  S/p EGD; has HH and gastritis. Recommended Zofran upon awakening; nausea recurs later in the day.  Out of Zofran again.  Only 20 are prescribed. At bedtime, sick again.  Continued Dexilant 60mg  daily and Zantac 150mg  qhs. Takes Topamax 1 qhs.  Did cut it back.  Had gastritis in twenties; suffered with nausea with gastritis.  Gastroenterology also recommended decreasing medications that cause nausea such as Wellbutrin or Prozac or Topamax.   Married twenty years this year. Takes oxycodone one daily; takes during evening; 1/2 tablet to one daily.   Immunization History  Administered Date(s) Administered  . Influenza,inj,Quad PF,36+ Mos 05/28/2015, 05/21/2016  . Tdap 06/25/2015   BP Readings from Last 3 Encounters:  10/14/16 139/83  08/29/16 131/74  08/12/16 130/86   Wt Readings from Last 3 Encounters:  10/14/16 246 lb (111.6 kg)  08/29/16 236 lb (107 kg)  08/12/16 236 lb 3.2 oz (107.1 kg)     Review of Systems  Constitutional: Negative for chills, diaphoresis, fatigue and fever.  Eyes: Negative for visual disturbance.  Respiratory: Negative for cough and shortness of breath.   Cardiovascular: Negative for chest pain, palpitations and leg swelling.  Gastrointestinal: Positive for nausea. Negative for abdominal pain, constipation, diarrhea and vomiting.  Endocrine: Negative for cold intolerance, heat intolerance, polydipsia, polyphagia and polyuria.  Musculoskeletal: Positive for arthralgias, myalgias, neck pain and neck stiffness.  Neurological: Negative for dizziness, tremors, seizures, syncope, facial asymmetry, speech difficulty,  weakness, light-headedness, numbness and headaches.  Psychiatric/Behavioral: Positive for dysphoric mood. The patient is nervous/anxious.     Past Medical History:  Diagnosis Date  . Allergy    [name], Flonase  . Anemia   . Anxiety   . Arthritis   . Chronic kidney disease   . Colon polyps   . Depression   . Fibromyalgia   . Gastritis   . GERD (gastroesophageal reflux disease)   . Hernia, hiatal   . HLD (hyperlipidemia)   . Hypertension   . Migraine   . Neuropathy ([place])   . Osteoporosis   . Pneumonia   . Sleep apnea with use of continuous positive airway pressure (CPAP)   . Thyroid goiter    Past Surgical History:  Procedure Laterality Date  . PARTIAL HYSTERECTOMY    . ROTATOR CUFF REPAIR Right 2009  . SHOULDER SURGERY Right    shoulder dislocation, fall, and elbow unla nerve  . SHOULDER SURGERY Right 2015   labral tear  . ULNAR NERVE REPAIR Right 08/19/2014   Allergies  Allergen Reactions  . Sulfa Antibiotics     Social History   Social History  . Marital status: Married    Spouse name: N/A  . Number of children: 3  . Years of education: N/A   Occupational History  . unemployed    Social History Main Topics  . Smoking status: Former Smoker    Quit date: 08/04/1998  . Smokeless tobacco: Never Used  . Alcohol use No  . Drug use: No  . Sexual activity: No   Other Topics Concern  . Not on file   Social History Narrative  . No narrative on  file   Family History  Problem Relation Age of Onset  . Heart disease Mother   . Hypertension Mother   . Heart disease Father   . Hypertension Father   . Stroke Father   . Prostate cancer Father   . Hypertension Brother   . Melanoma Brother   . Hyperlipidemia Brother   . Cancer Maternal Grandmother     type unknown  . Cancer Maternal Grandfather     type unknown  . Heart disease Paternal Grandmother   . Heart defect Paternal Grandfather   . Melanoma Brother   . Hyperlipidemia Brother   . Hypertension  Brother   . Hyperlipidemia Brother        Objective:    BP 139/83   Pulse 65   Temp 98.7 F (37.1 C) (Oral)   Resp 18   Ht 5\' 4"  (1.626 m)   Wt 246 lb (111.6 kg)   SpO2 92%   BMI 42.23 kg/m  Physical Exam  Constitutional: She is oriented to person, place, and time. She appears well-developed and well-nourished. No distress.  HENT:  Head: Normocephalic and atraumatic.  Right Ear: External ear normal.  Left Ear: External ear normal.  Nose: Nose normal.  Mouth/Throat: Oropharynx is clear and moist.  Eyes: Conjunctivae and EOM are normal. Pupils are equal, round, and reactive to light.  Neck: Normal range of motion. Neck supple. Carotid bruit is not present. No thyromegaly present.  Cardiovascular: Normal rate, regular rhythm, normal heart sounds and intact distal pulses.  Exam reveals no gallop and no friction rub.   No murmur heard. Pulmonary/Chest: Effort normal and breath sounds normal. She has no wheezes. She has no rales.  Abdominal: Soft. Bowel sounds are normal. She exhibits no distension and no mass. There is no tenderness. There is no rebound and no guarding.  Lymphadenopathy:    She has no cervical adenopathy.  Neurological: She is alert and oriented to person, place, and time. No cranial nerve deficit.  Skin: Skin is warm and dry. No rash noted. She is not diaphoretic. No erythema. No pallor.  Psychiatric: She has a normal mood and affect. Her behavior is normal.   Depression screen [facility] 2/9 10/14/2016 08/12/2016 07/15/2016 05/29/2016 05/21/2016  Decreased Interest 0 0 3 3 0  Down, Depressed, Hopeless 0 0 3 1 0  PHQ - 2 Score 0 0 6 4 0  Altered sleeping - 0 1 3 -  Tired, decreased energy - 0 3 3 -  Change in appetite - 0 2 3 -  Feeling bad or failure about yourself  - 0 3 1 -  Trouble concentrating - 0 2 3 -  Moving slowly or fidgety/restless - 0 2 0 -  Suicidal thoughts - 0 0 0 -  PHQ-9 Score - 0 19 17 -  Difficult doing work/chores - - Somewhat difficult  Somewhat difficult -   Fall Risk  10/14/2016 08/12/2016 07/15/2016 05/21/2016 04/22/2016  Falls in the past year? No Yes Yes No Yes  Number falls in past yr: - - - - 1  Injury with Fall? - - - - Yes      Assessment & Plan:   1. Nausea without vomiting   2. Adjustment disorder with mixed anxiety and depressed mood   3. Fibromyalgia   4. Chronic pain syndrome   5. Gastroesophageal reflux disease without esophagitis   6. Pure hypercholesterolemia   7. Hiatal hernia   8. Gastritis and gastroduodenitis    -persistent nausea;  s/p EGD per GI; no change in medications per GI.  Refill of Zofran provided.  Will decrease Wellbutrin XL to 150mg  daily to see if nausea improves.  Consider decrease in Prozac dose and/or Topamax dose as well.  Percocet can also be contributing to nausea.   -reviewed recent cholesterol levels that are greatly improved with Atorvastatin.   No orders of the defined types were placed in this encounter.  Meds ordered this encounter  Medications  . buPROPion (WELLBUTRIN XL) 150 MG 24 hr tablet    Sig: Take 1 tablet (150 mg total) by mouth daily.    Dispense:  90 tablet    Refill:  1  . ondansetron (ZOFRAN-ODT) 4 MG disintegrating tablet    Sig: DISSOLVE 1 TABLET BY MOUTH EVERY 8 HOURS AS NEEDED FOR NAUSEA OR VOMITING.    Dispense:  20 tablet    Refill:  4  . oxyCODONE-acetaminophen (PERCOCET/ROXICET) 5-325 MG tablet    Sig: Take 1 tablet by mouth every 8 (eight) hours as needed for moderate pain or severe pain.    Dispense:  30 tablet    Refill:  0    Return in about 3 months (around 01/14/2017) for recheck, complete physical examiniation.   Flannery Cavallero Elayne Guerin, M.D. Primary Care at [facility] [address] [place], [place]  [address] [phone] phone [phone] fax

## 2016-10-17 ENCOUNTER — Telehealth: Payer: Self-pay | Admitting: Family Medicine

## 2016-10-17 MED FILL — ATORVASTATIN 10 MG TABLET: 10 | 90 days supply | Qty: 90 | Fill #1

## 2016-10-17 MED FILL — raNITIdine HCL 150 MG TABS: 150 | 30 days supply | Qty: 30 | Fill #2

## 2016-10-17 NOTE — Telephone Encounter (Signed)
MyChart message sent to patient about rescheduling their appt with Dr Tamala Julian on 01/14/17.

## 2016-10-22 MED FILL — PROPRANOLOL ER 120 MG CAP: 120 | 90 days supply | Qty: 90 | Fill #1

## 2016-10-22 MED FILL — FLUoxetine HCL 40 MG CAPS: 40 | 90 days supply | Qty: 90 | Fill #1

## 2016-10-22 MED FILL — LISINOPRIL 20 MG TABLET: 20 | 90 days supply | Qty: 90 | Fill #1

## 2016-10-22 MED FILL — DEXILANT DR 60 MG CAPSULE: 60 | 90 days supply | Qty: 90 | Fill #1

## 2016-10-22 MED FILL — DARIFENACIN ER 15 MG TABLET: 15 | 90 days supply | Qty: 90 | Fill #1

## 2016-10-28 DIAGNOSIS — K2990 Gastroduodenitis, unspecified, without bleeding: Secondary | ICD-10-CM

## 2016-10-28 DIAGNOSIS — K449 Diaphragmatic hernia without obstruction or gangrene: Secondary | ICD-10-CM | POA: Insufficient documentation

## 2016-10-28 DIAGNOSIS — E7800 Pure hypercholesterolemia, unspecified: Secondary | ICD-10-CM | POA: Insufficient documentation

## 2016-10-28 DIAGNOSIS — K2970 Gastritis, unspecified, without bleeding: Secondary | ICD-10-CM | POA: Insufficient documentation

## 2016-11-06 MED FILL — ONDANSETRON ODT 4 MG TABLET: 4 | 7 days supply | Qty: 20 | Fill #1

## 2016-11-06 MED FILL — TOPIRAMATE 50 MG TABLET: 50 | 45 days supply | Qty: 90 | Fill #1

## 2016-11-28 ENCOUNTER — Other Ambulatory Visit: Payer: Self-pay | Admitting: Family Medicine

## 2016-11-28 MED FILL — GABAPENTIN 300 MG CAPSULE: 300 | 90 days supply | Qty: 360 | Fill #0

## 2016-11-28 MED FILL — raNITIdine HCL 150 MG TABS: 150 | 30 days supply | Qty: 30 | Fill #3

## 2016-12-01 MED FILL — ONDANSETRON ODT 4 MG TABLET: 4 | 7 days supply | Qty: 20 | Fill #2

## 2016-12-05 ENCOUNTER — Encounter: Payer: Self-pay | Admitting: Family Medicine

## 2016-12-12 MED ORDER — BUPROPION HCL ER (XL) 300 MG PO TB24: 300.0000 mg | ORAL_TABLET | Freq: Every day | ORAL | 1 refills | Status: DC

## 2016-12-12 NOTE — Addendum Note (Signed)
Addended by: Wardell Honour on: 12/12/2016 05:54 PM   Modules accepted: Orders

## 2016-12-17 MED FILL — BUPROPION HCL XL 300 MG TAB: 300 | 90 days supply | Qty: 90 | Fill #0

## 2017-01-02 MED FILL — raNITIdine HCL 150 MG TABS: 150 | 30 days supply | Qty: 30 | Fill #4

## 2017-01-02 MED FILL — ONDANSETRON ODT 4 MG TABLET: 4 | 7 days supply | Qty: 20 | Fill #3

## 2017-01-09 ENCOUNTER — Ambulatory Visit (INDEPENDENT_AMBULATORY_CARE_PROVIDER_SITE_OTHER): Payer: 59 | Admitting: Family Medicine

## 2017-01-09 ENCOUNTER — Encounter: Payer: Self-pay | Admitting: Family Medicine

## 2017-01-09 VITALS — BP 130/86 | HR 65 | Temp 99.0°F | Resp 16 | Ht 64.0 in | Wt 250.0 lb

## 2017-01-09 DIAGNOSIS — G894 Chronic pain syndrome: Secondary | ICD-10-CM

## 2017-01-09 DIAGNOSIS — Z131 Encounter for screening for diabetes mellitus: Secondary | ICD-10-CM

## 2017-01-09 DIAGNOSIS — G4733 Obstructive sleep apnea (adult) (pediatric): Secondary | ICD-10-CM

## 2017-01-09 DIAGNOSIS — Z0000 Encounter for general adult medical examination without abnormal findings: Secondary | ICD-10-CM | POA: Diagnosis not present

## 2017-01-09 DIAGNOSIS — K219 Gastro-esophageal reflux disease without esophagitis: Secondary | ICD-10-CM | POA: Diagnosis not present

## 2017-01-09 DIAGNOSIS — I10 Essential (primary) hypertension: Secondary | ICD-10-CM | POA: Diagnosis not present

## 2017-01-09 DIAGNOSIS — F4323 Adjustment disorder with mixed anxiety and depressed mood: Secondary | ICD-10-CM

## 2017-01-09 DIAGNOSIS — Z1231 Encounter for screening mammogram for malignant neoplasm of breast: Secondary | ICD-10-CM | POA: Diagnosis not present

## 2017-01-09 DIAGNOSIS — K449 Diaphragmatic hernia without obstruction or gangrene: Secondary | ICD-10-CM | POA: Diagnosis not present

## 2017-01-09 DIAGNOSIS — Z8669 Personal history of other diseases of the nervous system and sense organs: Secondary | ICD-10-CM | POA: Diagnosis not present

## 2017-01-09 DIAGNOSIS — J029 Acute pharyngitis, unspecified: Secondary | ICD-10-CM

## 2017-01-09 DIAGNOSIS — Z6841 Body Mass Index (BMI) 40.0 and over, adult: Secondary | ICD-10-CM

## 2017-01-09 DIAGNOSIS — E7800 Pure hypercholesterolemia, unspecified: Secondary | ICD-10-CM

## 2017-01-09 DIAGNOSIS — M797 Fibromyalgia: Secondary | ICD-10-CM | POA: Diagnosis not present

## 2017-01-09 DIAGNOSIS — Z1239 Encounter for other screening for malignant neoplasm of breast: Secondary | ICD-10-CM

## 2017-01-09 LAB — POCT URINALYSIS DIP (MANUAL ENTRY)
Blood, UA: NEGATIVE
Glucose, UA: NEGATIVE mg/dL
Nitrite, UA: NEGATIVE
SPEC GRAV UA: 1.025 (ref 1.010–1.025)
Urobilinogen, UA: 0.2 E.U./dL
pH, UA: 6 (ref 5.0–8.0)

## 2017-01-09 LAB — POCT RAPID STREP A (OFFICE): Rapid Strep A Screen: NEGATIVE

## 2017-01-09 MED ORDER — DULOXETINE HCL 60 MG PO CPEP: 60.0000 mg | ORAL_CAPSULE | Freq: Every day | ORAL | 0 refills | Status: DC

## 2017-01-09 MED ORDER — OXYCODONE-ACETAMINOPHEN 5-325 MG PO TABS: 1.0000 | ORAL_TABLET | Freq: Three times a day (TID) | ORAL | 0 refills | Status: DC | PRN

## 2017-01-09 MED ORDER — ZOSTER VAC RECOMB ADJUVANTED 50 MCG/0.5ML IM SUSR: 0.5000 mL | Freq: Once | INTRAMUSCULAR | 1 refills | Status: AC

## 2017-01-09 MED ORDER — PROPRANOLOL HCL 60 MG PO TABS: 60.0000 mg | ORAL_TABLET | Freq: Two times a day (BID) | ORAL | 1 refills | Status: DC

## 2017-01-09 MED FILL — OXYCODONE W/APAP 5/325 TAB: 5-325 | 10 days supply | Qty: 30 | Fill #0

## 2017-01-09 MED FILL — PROPRANOLOL 60 MG TABLET: 60 | 90 days supply | Qty: 180 | Fill #0

## 2017-01-09 MED FILL — DULoxetine HCL 60 MG CPEP: 60 | 90 days supply | Qty: 90 | Fill #0

## 2017-01-09 NOTE — Progress Notes (Signed)
Subjective:    Patient ID: [name], female    DOB: 09/13/1955, 61 y.o.   MRN: [id number]  01/09/2017  Annual Exam (sore throat)   HPI This 61 y.o. female presents for evaluation of Complete Physical Examination.  Last physical:  06-25-2015 Pap smear:  Hysterectomy; uterine enlargement; DUB; ovaries intact. No abnormal pap smears ever. Mammogram:  07-16-2015 Colonoscopy:  2012; Ferdinand Lango at [facility]. Repeat 10 years. Eye exam:  Glasses; had to reschedule this week. Dental exam:  3 years.    Immunization History  Administered Date(s) Administered  . Influenza,inj,Quad PF,36+ Mos 05/28/2015, 05/21/2016  . Tdap 06/25/2015   BP Readings from Last 3 Encounters:  01/09/17 130/86  10/14/16 139/83  08/29/16 131/74   Wt Readings from Last 3 Encounters:  01/09/17 250 lb (113.4 kg)  10/14/16 246 lb (111.6 kg)  08/29/16 236 lb (107 kg)    Nausea: taking Zofran bid.  No improvement since last visit; did not tolerate Wellbutrin XL 150mg  so has returned to Wellbutrin XL 300mg  does not take Percocet every day yet does suffer with nausea daily.   Fibromyalgia: pain has worsened.  Taking Gabapentin 2 every morning and 2 every evening.  Shoulders, hips, thighs, lower back, arms.    RUE chronic pain: tooth ache in arm 90% of time.    R shoulder pain/popping:  Hurting a lot.  Daldorff is orthopedist.   Sore throat: onset in past week.  No fever yet sweats/chills.  No headache.  No ear pain.  No rhinorrhea or nasal congestion.  +cough intermittent.   Review of Systems  Constitutional: Negative for activity change, appetite change, chills, diaphoresis, fatigue, fever and unexpected weight change.  HENT: Positive for sore throat. Negative for congestion, dental problem, drooling, ear discharge, ear pain, facial swelling, hearing loss, mouth sores, nosebleeds, postnasal drip, rhinorrhea, sinus pain, sinus pressure, sneezing, tinnitus, trouble swallowing and voice change.   Eyes:  Negative for photophobia, pain, discharge, redness, itching and visual disturbance.  Respiratory: Positive for cough. Negative for apnea, choking, chest tightness, shortness of breath, wheezing and stridor.   Cardiovascular: Negative for chest pain, palpitations and leg swelling.  Gastrointestinal: Positive for nausea. Negative for abdominal distention, abdominal pain, anal bleeding, blood in stool, constipation, diarrhea, rectal pain and vomiting.  Endocrine: Negative for cold intolerance, heat intolerance, polydipsia, polyphagia and polyuria.  Genitourinary: Negative for decreased urine volume, difficulty urinating, dyspareunia, dysuria, enuresis, flank pain, frequency, genital sores, hematuria, menstrual problem, pelvic pain, urgency, vaginal bleeding, vaginal discharge and vaginal pain.  Musculoskeletal: Positive for arthralgias, back pain, neck pain and neck stiffness. Negative for gait problem, joint swelling and myalgias.  Skin: Negative for color change, pallor, rash and wound.  Allergic/Immunologic: Negative for environmental allergies, food allergies and immunocompromised state.  Neurological: Positive for numbness. Negative for dizziness, tremors, seizures, syncope, facial asymmetry, speech difficulty, weakness, light-headedness and headaches.  Hematological: Negative for adenopathy. Does not bruise/bleed easily.  Psychiatric/Behavioral: Positive for dysphoric mood and sleep disturbance. Negative for agitation, behavioral problems, confusion, decreased concentration, hallucinations, self-injury and suicidal ideas. The patient is not nervous/anxious and is not hyperactive.        RLS keeps up; will get up.    Past Medical History:  Diagnosis Date  . Allergy    [name], Flonase  . Anemia   . Anxiety   . Arthritis   . Chronic kidney disease   . Colon polyps   . Depression   . Fibromyalgia   . Gastritis   . GERD (  gastroesophageal reflux disease)   . Hernia, hiatal   . HLD  (hyperlipidemia)   . Hypertension   . Migraine   . Neuropathy   . Osteoporosis   . Pneumonia   . Sleep apnea with use of continuous positive airway pressure (CPAP)   . Thyroid goiter    Past Surgical History:  Procedure Laterality Date  . ABDOMINAL HYSTERECTOMY     ovaries intact; DUB.  No dysplasia.  Marland Kitchen PARTIAL HYSTERECTOMY    . ROTATOR CUFF REPAIR Right 2009  . SHOULDER SURGERY Right    shoulder dislocation, fall, and elbow unla nerve  . SHOULDER SURGERY Right 2015   labral tear  . TUBAL LIGATION    . ULNAR NERVE REPAIR Right 08/19/2014   Allergies  Allergen Reactions  . Sulfa Antibiotics     Social History   Social History  . Marital status: Married    Spouse name: N/A  . Number of children: 3  . Years of education: N/A   Occupational History  . unemployed    Social History Main Topics  . Smoking status: Former Smoker    Quit date: 08/04/1998  . Smokeless tobacco: Never Used  . Alcohol use No  . Drug use: No  . Sexual activity: No   Other Topics Concern  . Not on file   Social History Narrative   Marital status: married x 19 years; moderately happy     Children: 3 children ([place], [place], 39 April); 8 grandchildren; 0 gg     Lives: with husband/Steve, April, 2 granddaughters     Employment:  Unemployed; quit working 2015 with work related injury; awaiting disability in 2018     Tobacco: quit smoking in 2016; smoked x 2 years.      Alcohol: none      Drugs: none      Exercise: rarely in 2018         Family History  Problem Relation Age of Onset  . Heart disease Mother 65       AMI/CAD/CHF as cause of death  . Hypertension Mother   . Hyperlipidemia Mother   . Hypothyroidism Mother   . Depression Mother   . Gout Mother   . Heart disease Father 36       AMI  . Hypertension Father   . Stroke Father 27       mild CVA  . Prostate cancer Father   . Hyperlipidemia Father   . Cancer Father 24       prostate cancer  . Hypertension Brother   .  Hyperlipidemia Brother   . Cancer Brother        prostate cancer  . Cancer Maternal Grandmother        type unknown  . Cancer Maternal Grandfather        type unknown  . Heart disease Paternal Grandmother   . Heart defect Paternal Grandfather   . Melanoma Brother   . Hyperlipidemia Brother   . Hypertension Brother   . Cancer Brother        melanoma  . Hypertension Brother   . Hyperlipidemia Brother   . Melanoma Brother   . Cancer Brother 25       melanoma  . Cancer Sister        Basal cell carcinoma scalp  . Depression Sister   . Hypertension Brother   . Depression Brother   . Hypertension Brother   . Gout Brother   . Arthritis Sister   .  Depression Sister        Objective:    BP 130/86 (BP Location: Right Arm, Patient Position: Sitting, Cuff Size: Large)   Pulse 65   Temp 99 F (37.2 C) (Oral)   Resp 16   Ht 5\' 4"  (1.626 m)   Wt 250 lb (113.4 kg)   SpO2 96%   BMI 42.91 kg/m  Physical Exam  Constitutional: She is oriented to person, place, and time. She appears well-developed and well-nourished. No distress.  HENT:  Head: Normocephalic and atraumatic.  Right Ear: External ear normal.  Left Ear: External ear normal.  Nose: Nose normal.  Mouth/Throat: Oropharynx is clear and moist.  Eyes: Conjunctivae and EOM are normal. Pupils are equal, round, and reactive to light.  Neck: Normal range of motion and full passive range of motion without pain. Neck supple. No JVD present. Carotid bruit is not present. No thyromegaly present.  Cardiovascular: Normal rate, regular rhythm and normal heart sounds.  Exam reveals no gallop and no friction rub.   No murmur heard. Pulmonary/Chest: Effort normal and breath sounds normal. She has no wheezes. She has no rales. Right breast exhibits no inverted nipple, no mass, no nipple discharge, no skin change and no tenderness. Left breast exhibits no inverted nipple, no mass, no nipple discharge, no skin change and no tenderness. Breasts  are symmetrical.  Abdominal: Soft. Bowel sounds are normal. She exhibits no distension and no mass. There is no tenderness. There is no rebound and no guarding.  Genitourinary: Vagina normal. There is no rash, tenderness, lesion or injury on the right labia. There is no rash, tenderness, lesion or injury on the left labia. Right adnexum displays no mass, no tenderness and no fullness. Left adnexum displays no mass, no tenderness and no fullness.  Musculoskeletal:       Right shoulder: Normal.       Left shoulder: Normal.       Cervical back: Normal.  Lymphadenopathy:    She has no cervical adenopathy.  Neurological: She is alert and oriented to person, place, and time. She has normal reflexes. No cranial nerve deficit. She exhibits normal muscle tone. Coordination normal.  Skin: Skin is warm and dry. No rash noted. She is not diaphoretic. No erythema. No pallor.  Psychiatric: She has a normal mood and affect. Her behavior is normal. Judgment and thought content normal.  Nursing note and vitals reviewed.  Depression screen [facility] 2/9 01/09/2017 10/14/2016 08/12/2016 07/15/2016 05/29/2016  Decreased Interest 3 0 0 3 3  Down, Depressed, Hopeless 3 0 0 3 1  PHQ - 2 Score 6 0 0 6 4  Altered sleeping 3 - 0 1 3  Tired, decreased energy 3 - 0 3 3  Change in appetite 2 - 0 2 3  Feeling bad or failure about yourself  3 - 0 3 1  Trouble concentrating 3 - 0 2 3  Moving slowly or fidgety/restless 3 - 0 2 0  Suicidal thoughts 0 - 0 0 0  PHQ-9 Score 23 - 0 19 17  Difficult doing work/chores Somewhat difficult - - Somewhat difficult Somewhat difficult   Fall Risk  01/09/2017 10/14/2016 08/12/2016 07/15/2016 05/21/2016  Falls in the past year? No No Yes Yes No  Number falls in past yr: - - - - -  Injury with Fall? - - - - -        Assessment & Plan:   1. Routine physical examination   2. Breast cancer screening  3. Essential hypertension   4. Hiatal hernia   5. OSA (obstructive sleep apnea)   6.  Gastroesophageal reflux disease without esophagitis   7. Adjustment disorder with mixed anxiety and depressed mood   8. Chronic pain syndrome   9. Fibromyalgia   10. Pure hypercholesterolemia   11. Hx of migraine headaches   12. Screening for diabetes mellitus   13. Sore throat   14. Class 3 severe obesity due to excess calories with serious comorbidity and body mass index (BMI) of 40.0 to 44.9 in adult [facility])    -anticipatory guidance provided --- exercise, weight loss, safe driving practices, aspirin 81mg  daily. -obtain age appropriate screening labs and labs for chronic disease management. -obtain strep throat cx; treat supportively with rest, Ibuprofen and/or Tylenol; RTC inability to swallow. -continues to suffer with ongoing nausea and uncontrolled depression; intolerant to Wellbutrin XL 150mg  daily due to itching.  Will wean Prozac and start Cymbalta.   RTC six weeks for follow--up. -Inderal LA expensive; switch to Propranolol bid; RTC six weeks for BP check. -recommend weight loss, exercise, and 1200 kcal limitation per day.   Orders Placed This Encounter  Procedures  . Culture, Group A Strep    Order Specific Question:   Source    Answer:   oropharynx  . MM SCREENING BREAST TOMO BILATERAL    Standing Status:   Future    Standing Expiration Date:   03/11/2018    Order Specific Question:   Reason for Exam (SYMPTOM  OR DIAGNOSIS REQUIRED)    Answer:   annual screening    Order Specific Question:   Is the patient pregnant?    Answer:   No    Order Specific Question:   Preferred imaging location?    Answer:   [facility]  . CBC with Differential/Platelet  . Comprehensive metabolic panel    Order Specific Question:   Has the patient fasted?    Answer:   Yes  . Hemoglobin A1c  . Lipid panel    Order Specific Question:   Has the patient fasted?    Answer:   Yes  . TSH  . POCT urinalysis dipstick  . POCT rapid strep A  . EKG 12-Lead   Meds ordered this encounter    Medications  . propranolol (INDERAL) 60 MG tablet    Sig: Take 1 tablet (60 mg total) by mouth 2 (two) times daily.    Dispense:  180 tablet    Refill:  1  . DULoxetine (CYMBALTA) 60 MG capsule    Sig: Take 1 capsule (60 mg total) by mouth daily.    Dispense:  90 capsule    Refill:  0  . Zoster Vac Recomb Adjuvanted (SHINGRIX) injection    Sig: Inject 0.5 mLs into the muscle once.    Dispense:  0.5 mL    Refill:  1  . oxyCODONE-acetaminophen (PERCOCET/ROXICET) 5-325 MG tablet    Sig: Take 1 tablet by mouth every 8 (eight) hours as needed for moderate pain or severe pain.    Dispense:  30 tablet    Refill:  0    Return in about 6 weeks (around 02/20/2017) for recheck ANXIETY/DEPRESSION, FIBROMYALGIA, HIGH BLOOD PRESSURE.   Shubh Chiara Elayne Guerin, M.D. Primary Care at [facility] previously [facility] [place] [address] [place], [place]  [address] [phone] phone [phone] fax

## 2017-01-09 NOTE — Patient Instructions (Addendum)
START TAKING WELLBUTRIN XL 300MG ONE EVERY OTHER DAY FOR TWO WEEKS AND THEN STOP. START CYMBALTA 60MG ONE EVERY MORNING NOW. (CYMBALTA WILL REPLACE WELLBUTRIN). CONTINUE PROZAC 40MG ONE DAILY FOR NOW. Preventive Care 40-64 Years, Female Preventive care refers to lifestyle choices and visits with your health care provider that can promote health and wellness. What does preventive care include?  A yearly physical exam. This is also called an annual well check.  Dental exams once or twice a year.  Routine eye exams. Ask your health care provider how often you should have your eyes checked.  Personal lifestyle choices, including: ? Daily care of your teeth and gums. ? Regular physical activity. ? Eating a healthy diet. ? Avoiding tobacco and drug use. ? Limiting alcohol use. ? Practicing safe sex. ? Taking low-dose aspirin daily starting at age 21. ? Taking vitamin and mineral supplements as recommended by your health care provider. What happens during an annual well check? The services and screenings done by your health care provider during your annual well check will depend on your age, overall health, lifestyle risk factors, and family history of disease. Counseling Your health care provider may ask you questions about your:  Alcohol use.  Tobacco use.  Drug use.  Emotional well-being.  Home and relationship well-being.  Sexual activity.  Eating habits.  Work and work Statistician.  Method of birth control.  Menstrual cycle.  Pregnancy history.  Screening You may have the following tests or measurements:  Height, weight, and BMI.  Blood pressure.  Lipid and cholesterol levels. These may be checked every 5 years, or more frequently if you are over 21 years old.  Skin check.  Lung cancer screening. You may have this screening every year starting at age 73 if you have a 30-pack-year history of smoking and currently smoke or have quit within the past 15  years.  Fecal occult blood test (FOBT) of the stool. You may have this test every year starting at age 35.  Flexible sigmoidoscopy or colonoscopy. You may have a sigmoidoscopy every 5 years or a colonoscopy every 10 years starting at age 16.  Hepatitis C blood test.  Hepatitis B blood test.  Sexually transmitted disease (STD) testing.  Diabetes screening. This is done by checking your blood sugar (glucose) after you have not eaten for a while (fasting). You may have this done every 1-3 years.  Mammogram. This may be done every 1-2 years. Talk to your health care provider about when you should start having regular mammograms. This may depend on whether you have a family history of breast cancer.  BRCA-related cancer screening. This may be done if you have a family history of breast, ovarian, tubal, or peritoneal cancers.  Pelvic exam and Pap test. This may be done every 3 years starting at age 61. Starting at age 68, this may be done every 5 years if you have a Pap test in combination with an HPV test.  Bone density scan. This is done to screen for osteoporosis. You may have this scan if you are at high risk for osteoporosis.  Discuss your test results, treatment options, and if necessary, the need for more tests with your health care provider. Vaccines Your health care provider may recommend certain vaccines, such as:  Influenza vaccine. This is recommended every year.  Tetanus, diphtheria, and acellular pertussis (Tdap, Td) vaccine. You may need a Td booster every 10 years.  Varicella vaccine. You may need this if you have not  been vaccinated.  Zoster vaccine. You may need this after age 48.  Measles, mumps, and rubella (MMR) vaccine. You may need at least one dose of MMR if you were born in 1957 or later. You may also need a second dose.  Pneumococcal 13-valent conjugate (PCV13) vaccine. You may need this if you have certain conditions and were not previously  vaccinated.  Pneumococcal polysaccharide (PPSV23) vaccine. You may need one or two doses if you smoke cigarettes or if you have certain conditions.  Meningococcal vaccine. You may need this if you have certain conditions.  Hepatitis A vaccine. You may need this if you have certain conditions or if you travel or work in places where you may be exposed to hepatitis A.  Hepatitis B vaccine. You may need this if you have certain conditions or if you travel or work in places where you may be exposed to hepatitis B.  Haemophilus influenzae type b (Hib) vaccine. You may need this if you have certain conditions.  Talk to your health care provider about which screenings and vaccines you need and how often you need them. This information is not intended to replace advice given to you by your health care provider. Make sure you discuss any questions you have with your health care provider. Document Released: 08/17/2015 Document Revised: 04/09/2016 Document Reviewed: 05/22/2015 Elsevier Interactive Patient Education  2017 [employer].    IF you received an x-ray today, you will receive an invoice from [facility]. Please contact [name] at [phone] with questions or concerns regarding your invoice.   IF you received labwork today, you will receive an invoice from [facility]. Please contact [facility] at [phone] with questions or concerns regarding your invoice.   Our billing staff will not be able to assist you with questions regarding bills from these companies.  You will be contacted with the lab results as soon as they are available. The fastest way to get your results is to activate your My Chart account. Instructions are located on the last page of this paperwork. If you have not heard from [place] regarding the results in 2 weeks, please contact this office.

## 2017-01-10 LAB — CBC WITH DIFFERENTIAL/PLATELET
Basophils Absolute: 0 10*3/uL (ref 0.0–0.2)
Basos: 0 %
EOS (ABSOLUTE): 0.2 10*3/uL (ref 0.0–0.4)
Eos: 3 %
HEMOGLOBIN: 12.2 g/dL (ref 11.1–15.9)
Hematocrit: 38.6 % (ref 34.0–46.6)
Immature Grans (Abs): 0 10*3/uL (ref 0.0–0.1)
Immature Granulocytes: 0 %
LYMPHS ABS: 1.2 10*3/uL (ref 0.7–3.1)
Lymphs: 23 %
MCH: 27.4 pg (ref 26.6–33.0)
MCHC: 31.6 g/dL (ref 31.5–35.7)
MCV: 87 fL (ref 79–97)
MONOCYTES: 9 %
Monocytes Absolute: 0.4 10*3/uL (ref 0.1–0.9)
NEUTROS ABS: 3.2 10*3/uL (ref 1.4–7.0)
Neutrophils: 65 %
PLATELETS: 237 10*3/uL (ref 150–379)
RBC: 4.45 x10E6/uL (ref 3.77–5.28)
RDW: 14.6 % (ref 12.3–15.4)
WBC: 5 10*3/uL (ref 3.4–10.8)

## 2017-01-10 LAB — LIPID PANEL
Chol/HDL Ratio: 2.9 ratio (ref 0.0–4.4)
Cholesterol, Total: 172 mg/dL (ref 100–199)
HDL: 59 mg/dL (ref >39)
LDL CALC: 93 mg/dL (ref 0–99)
Triglycerides: 102 mg/dL (ref 0–149)
VLDL CHOLESTEROL CAL: 20 mg/dL (ref 5–40)

## 2017-01-10 LAB — COMPREHENSIVE METABOLIC PANEL
ALBUMIN: 4.1 g/dL (ref 3.6–4.8)
ALK PHOS: 86 IU/L (ref 39–117)
ALT: 9 IU/L (ref 0–32)
AST: 20 IU/L (ref 0–40)
Albumin/Globulin Ratio: 1.6 (ref 1.2–2.2)
BILIRUBIN TOTAL: 0.3 mg/dL (ref 0.0–1.2)
BUN/Creatinine Ratio: 10 — ABNORMAL LOW (ref 12–28)
BUN: 11 mg/dL (ref 8–27)
CO2: 21 mmol/L (ref 18–29)
CREATININE: 1.12 mg/dL — AB (ref 0.57–1.00)
Calcium: 8.9 mg/dL (ref 8.7–10.3)
Chloride: 107 mmol/L — ABNORMAL HIGH (ref 96–106)
GFR calc Af Amer: 62 mL/min/{1.73_m2} (ref >59)
GFR calc non Af Amer: 54 mL/min/{1.73_m2} — ABNORMAL LOW (ref >59)
GLUCOSE: 92 mg/dL (ref 65–99)
Globulin, Total: 2.5 g/dL (ref 1.5–4.5)
Potassium: 4.5 mmol/L (ref 3.5–5.2)
Sodium: 141 mmol/L (ref 134–144)
Total Protein: 6.6 g/dL (ref 6.0–8.5)

## 2017-01-10 LAB — TSH: TSH: 1.59 u[IU]/mL (ref 0.450–4.500)

## 2017-01-10 LAB — HEMOGLOBIN A1C
Est. average glucose Bld gHb Est-mCnc: 123 mg/dL
Hgb A1c MFr Bld: 5.9 % — ABNORMAL HIGH (ref 4.8–5.6)

## 2017-01-12 LAB — CULTURE, GROUP A STREP: STREP A CULTURE: NEGATIVE

## 2017-01-13 ENCOUNTER — Encounter: Payer: Self-pay | Admitting: Family Medicine

## 2017-01-13 DIAGNOSIS — Z6841 Body Mass Index (BMI) 40.0 and over, adult: Secondary | ICD-10-CM

## 2017-01-13 DIAGNOSIS — E66812 Obesity, class 2: Secondary | ICD-10-CM | POA: Insufficient documentation

## 2017-01-14 ENCOUNTER — Encounter: Payer: 59 | Admitting: Family Medicine

## 2017-02-02 ENCOUNTER — Telehealth: Payer: Self-pay | Admitting: Family Medicine

## 2017-02-02 NOTE — Telephone Encounter (Signed)
Pt is checking on status of her blood pressure refill request   Best number [phone]

## 2017-02-03 ENCOUNTER — Encounter: Payer: Self-pay | Admitting: Family Medicine

## 2017-02-09 ENCOUNTER — Other Ambulatory Visit: Payer: Self-pay | Admitting: Family Medicine

## 2017-02-09 MED ORDER — LISINOPRIL 20 MG PO TABS: 20.0000 mg | ORAL_TABLET | Freq: Every day | ORAL | 1 refills | Status: DC

## 2017-02-09 MED ORDER — DEXLANSOPRAZOLE 60 MG PO CPDR: 1.0000 | DELAYED_RELEASE_CAPSULE | Freq: Every day | ORAL | 1 refills | Status: DC

## 2017-02-09 MED ORDER — FLUOXETINE HCL 40 MG PO CAPS: 40.0000 mg | ORAL_CAPSULE | Freq: Every day | ORAL | 1 refills | Status: DC

## 2017-02-09 MED FILL — raNITIdine HCL 150 MG TABS: 150 | 30 days supply | Qty: 30 | Fill #5

## 2017-02-09 MED FILL — DEXILANT DR 60 MG CAPSULE: 60 | 90 days supply | Qty: 90 | Fill #0

## 2017-02-09 MED FILL — SHINGRIX 50 MCG SUS: 50 | 1 days supply | Qty: 1 | Fill #0

## 2017-02-09 MED FILL — FLUoxetine HCL 40 MG CAPS: 40 | 90 days supply | Qty: 90 | Fill #0

## 2017-02-09 MED FILL — LISINOPRIL 20 MG TABLET: 20 | 90 days supply | Qty: 90 | Fill #0

## 2017-02-09 MED FILL — ONDANSETRON ODT 4 MG TABLET: 4 | 7 days supply | Qty: 20 | Fill #4

## 2017-02-09 MED FILL — TOPIRAMATE 50 MG TABLET: 50 | 45 days supply | Qty: 90 | Fill #2

## 2017-02-11 ENCOUNTER — Other Ambulatory Visit: Payer: Self-pay | Admitting: Family Medicine

## 2017-02-12 MED FILL — DARIFENACIN ER 15 MG TABLET: 15 | 90 days supply | Qty: 90 | Fill #0

## 2017-02-12 MED FILL — ATORVASTATIN 10 MG TABLET: 10 | 90 days supply | Qty: 90 | Fill #0

## 2017-02-17 ENCOUNTER — Encounter: Payer: Self-pay | Admitting: Family Medicine

## 2017-02-17 ENCOUNTER — Ambulatory Visit (INDEPENDENT_AMBULATORY_CARE_PROVIDER_SITE_OTHER): Payer: 59 | Admitting: Family Medicine

## 2017-02-17 VITALS — BP 133/85 | HR 88 | Temp 98.0°F | Resp 18 | Ht 64.17 in | Wt 257.0 lb

## 2017-02-17 DIAGNOSIS — G894 Chronic pain syndrome: Secondary | ICD-10-CM

## 2017-02-17 DIAGNOSIS — M797 Fibromyalgia: Secondary | ICD-10-CM

## 2017-02-17 DIAGNOSIS — F4323 Adjustment disorder with mixed anxiety and depressed mood: Secondary | ICD-10-CM | POA: Diagnosis not present

## 2017-02-17 DIAGNOSIS — G43009 Migraine without aura, not intractable, without status migrainosus: Secondary | ICD-10-CM | POA: Diagnosis not present

## 2017-02-17 DIAGNOSIS — R110 Nausea: Secondary | ICD-10-CM

## 2017-02-17 DIAGNOSIS — N289 Disorder of kidney and ureter, unspecified: Secondary | ICD-10-CM

## 2017-02-17 DIAGNOSIS — K2990 Gastroduodenitis, unspecified, without bleeding: Secondary | ICD-10-CM | POA: Diagnosis not present

## 2017-02-17 DIAGNOSIS — K2970 Gastritis, unspecified, without bleeding: Secondary | ICD-10-CM | POA: Diagnosis not present

## 2017-02-17 DIAGNOSIS — K219 Gastro-esophageal reflux disease without esophagitis: Secondary | ICD-10-CM | POA: Diagnosis not present

## 2017-02-17 MED ORDER — RANITIDINE HCL 150 MG PO TABS: 150.0000 mg | ORAL_TABLET | Freq: Every day | ORAL | 1 refills | Status: DC

## 2017-02-17 MED ORDER — NYSTATIN 100000 UNIT/ML MT SUSP: 5.0000 mL | Freq: Four times a day (QID) | OROMUCOSAL | 0 refills | Status: DC

## 2017-02-17 MED ORDER — DULOXETINE HCL 60 MG PO CPEP: 120.0000 mg | ORAL_CAPSULE | Freq: Every day | ORAL | 1 refills | Status: DC

## 2017-02-17 MED ORDER — GABAPENTIN 300 MG PO CAPS: 300.0000 mg | ORAL_CAPSULE | Freq: Four times a day (QID) | ORAL | 1 refills | Status: DC

## 2017-02-17 MED FILL — NYSTATIN 100,000 UNITS/ML S: 100000 | 3 days supply | Qty: 60 | Fill #0

## 2017-02-17 MED FILL — GABAPENTIN 300 MG CAPSULE: 300 | 68 days supply | Qty: 540 | Fill #0

## 2017-02-17 NOTE — Patient Instructions (Addendum)
DECREASE FLUOXETINE/PROZAC 40MG  EVERY OTHER DAY. INCREASE CYMBALTA 60MG  TO TWO DAILY.    IF you received an x-ray today, you will receive an invoice from [facility]. Please contact [facility] at [phone] with questions or concerns regarding your invoice.   IF you received labwork today, you will receive an invoice from [facility]. Please contact [facility] at [phone] with questions or concerns regarding your invoice.   Our billing staff will not be able to assist you with questions regarding bills from these companies.  You will be contacted with the lab results as soon as they are available. The fastest way to get your results is to activate your My Chart account. Instructions are located on the last page of this paperwork. If you have not heard from [place] regarding the results in 2 weeks, please contact this office.

## 2017-02-17 NOTE — Progress Notes (Signed)
Subjective:    Patient ID: [name], female    DOB: Dec 13, 1955, 61 y.o.   MRN: [id number]  02/17/2017  Nausea (x 2 weeks )   HPI This 61 y.o. female presents for evaluation of nausea.  Has been feeling more nauseated since last visit.  Recent medication adjustment; went without all medication for a few days.  Went without medication (Dexilant, Fluoxetine, and Lisinopril) without medication; off of all three medications for one week. Taking Zantac 150mg  qhs.    Has clog in throat; cannot swallow it down or cough it up.   Finally got approved for disability.  Needs long term disability form completed.  Switched to Propanolol LA to bid.  Here for BP check.  BP has been good.   Started Cymbalta 60mg  daily.  Stopped Wellbutrin XL 300mg  daily.  Decreased Prozac to 40mg  daily.  Down a little since last visit.  No SI.  Hot weather.  Swelling.  Drinking a lot of water.  Lips will crack in corners.    Lips cracker:  Carmex; rare tomatoes.   Migraines: switched Propanolol; no migraines in six months.  Mild headache.  Topamax one qhs.    . BP Readings from Last 3 Encounters:  02/17/17 133/85  01/09/17 130/86  10/14/16 139/83   Wt Readings from Last 3 Encounters:  02/17/17 257 lb (116.6 kg)  01/09/17 250 lb (113.4 kg)  10/14/16 246 lb (111.6 kg)   Immunization History  Administered Date(s) Administered  . Influenza,inj,Quad PF,36+ Mos 05/28/2015, 05/21/2016  . Tdap 06/25/2015  . Zoster Recombinat (Shingrix) 02/09/2017     Review of Systems  Constitutional: Negative for chills, diaphoresis, fatigue and fever.  Eyes: Negative for visual disturbance.  Respiratory: Negative for cough and shortness of breath.   Cardiovascular: Negative for chest pain, palpitations and leg swelling.  Gastrointestinal: Positive for nausea. Negative for abdominal distention, abdominal pain, anal bleeding, blood in stool, constipation, diarrhea, rectal pain and vomiting.  Endocrine: Negative for cold  intolerance, heat intolerance, polydipsia, polyphagia and polyuria.  Musculoskeletal: Positive for gait problem.  Neurological: Negative for dizziness, tremors, seizures, syncope, facial asymmetry, speech difficulty, weakness, light-headedness, numbness and headaches.  Psychiatric/Behavioral: Negative for dysphoric mood, self-injury and sleep disturbance. The patient is not nervous/anxious.     Past Medical History:  Diagnosis Date  . Allergy    [name], Flonase  . Anemia   . Anxiety   . Arthritis   . Chronic kidney disease   . Colon polyps   . Depression   . Fibromyalgia   . Gastritis   . GERD (gastroesophageal reflux disease)   . Hernia, hiatal   . HLD (hyperlipidemia)   . Hypertension   . Migraine   . Neuropathy   . Osteoporosis   . Pneumonia   . Sleep apnea with use of continuous positive airway pressure (CPAP)   . Thyroid goiter    Past Surgical History:  Procedure Laterality Date  . ABDOMINAL HYSTERECTOMY     ovaries intact; DUB.  No dysplasia.  Marland Kitchen PARTIAL HYSTERECTOMY    . ROTATOR CUFF REPAIR Right 2009  . SHOULDER SURGERY Right    shoulder dislocation, fall, and elbow unla nerve  . SHOULDER SURGERY Right 2015   labral tear  . TUBAL LIGATION    . ULNAR NERVE REPAIR Right 08/19/2014   Allergies  Allergen Reactions  . Sulfa Antibiotics     Social History   Social History  . Marital status: Married    Spouse name: N/A  .  Number of children: 3  . Years of education: N/A   Occupational History  . unemployed    Social History Main Topics  . Smoking status: Former Smoker    Quit date: 08/04/1998  . Smokeless tobacco: Never Used  . Alcohol use No  . Drug use: No  . Sexual activity: No   Other Topics Concern  . Not on file   Social History Narrative   Marital status: married x 19 years; moderately happy     Children: 3 children ([place], [place], 39 April); 8 grandchildren; 0 gg     Lives: with husband/Steve, April, 2 granddaughters      Employment:  Unemployed; quit working 2015 with work related injury; awaiting disability in 2018; disability approved in 02/2017.       Tobacco: quit smoking in 2016; smoked x 2 years.      Alcohol: none      Drugs: none      Exercise: rarely in 2018         Family History  Problem Relation Age of Onset  . Heart disease Mother 24       AMI/CAD/CHF as cause of death  . Hypertension Mother   . Hyperlipidemia Mother   . Hypothyroidism Mother   . Depression Mother   . Gout Mother   . Heart disease Father 40       AMI  . Hypertension Father   . Stroke Father 64       mild CVA  . Prostate cancer Father   . Hyperlipidemia Father   . Cancer Father 74       prostate cancer  . Hypertension Brother   . Hyperlipidemia Brother   . Cancer Brother        prostate cancer  . Cancer Maternal Grandmother        type unknown  . Cancer Maternal Grandfather        type unknown  . Heart disease Paternal Grandmother   . Heart defect Paternal Grandfather   . Melanoma Brother   . Hyperlipidemia Brother   . Hypertension Brother   . Cancer Brother        melanoma  . Hypertension Brother   . Hyperlipidemia Brother   . Melanoma Brother   . Cancer Brother 25       melanoma  . Cancer Sister        Basal cell carcinoma scalp  . Depression Sister   . Hypertension Brother   . Depression Brother   . Hypertension Brother   . Gout Brother   . Arthritis Sister   . Depression Sister        Objective:    BP 133/85   Pulse 88   Temp 98 F (36.7 C) (Oral)   Resp 18   Ht 5' 4.17" (1.63 m)   Wt 257 lb (116.6 kg)   SpO2 96%   BMI 43.88 kg/m  Physical Exam  Constitutional: She is oriented to person, place, and time. She appears well-developed and well-nourished. No distress.  HENT:  Head: Normocephalic and atraumatic.  Right Ear: External ear normal.  Left Ear: External ear normal.  Nose: Nose normal.  Mouth/Throat: Oropharynx is clear and moist.  Eyes: Pupils are equal, round, and  reactive to light. Conjunctivae and EOM are normal.  Neck: Normal range of motion. Neck supple. Carotid bruit is not present. No thyromegaly present.  Cardiovascular: Normal rate, regular rhythm, normal heart sounds and intact distal pulses.  Exam  reveals no gallop and no friction rub.   No murmur heard. Pulmonary/Chest: Effort normal and breath sounds normal. She has no wheezes. She has no rales.  Abdominal: Soft. Bowel sounds are normal. She exhibits no distension and no mass. There is no tenderness. There is no rebound and no guarding.  Lymphadenopathy:    She has no cervical adenopathy.  Neurological: She is alert and oriented to person, place, and time. No cranial nerve deficit.  Skin: Skin is warm and dry. No rash noted. She is not diaphoretic. No erythema. No pallor.  Psychiatric: She has a normal mood and affect. Her behavior is normal. Judgment and thought content normal.   Depression screen [facility] 2/9 01/09/2017 10/14/2016 08/12/2016 07/15/2016 05/29/2016  Decreased Interest 3 0 0 3 3  Down, Depressed, Hopeless 3 0 0 3 1  PHQ - 2 Score 6 0 0 6 4  Altered sleeping 3 - 0 1 3  Tired, decreased energy 3 - 0 3 3  Change in appetite 2 - 0 2 3  Feeling bad or failure about yourself  3 - 0 3 1  Trouble concentrating 3 - 0 2 3  Moving slowly or fidgety/restless 3 - 0 2 0  Suicidal thoughts 0 - 0 0 0  PHQ-9 Score 23 - 0 19 17  Difficult doing work/chores Somewhat difficult - - Somewhat difficult Somewhat difficult        Assessment & Plan:   1. Renal insufficiency   2. Nausea without vomiting   3. Adjustment disorder with mixed anxiety and depressed mood   4. Chronic pain syndrome   5. Fibromyalgia   6. Migraine without aura and without status migrainosus, not intractable   7. Gastroesophageal reflux disease without esophagitis   8. Gastritis and gastroduodenitis    -improved anxiety an depression due to recent granting of disability; decrease PRozac 40mg  to every other day; increase  Cymbalta to 60mg  daily for neuropathy, anxiety/depression, and chronic pain.   -worsening nausea recently; known gastritis; refill of Zantac provided. -refill of Gabapentin also provided for neuropathy pain; hypersomnolence limits uptitration of dose.  -rx for Nystatin for chelitis.   Orders Placed This Encounter  Procedures  . Comprehensive metabolic panel   Meds ordered this encounter  Medications  . gabapentin (NEURONTIN) 300 MG capsule    Sig: Take 1-2 capsules (300-600 mg total) by mouth 4 (four) times daily.    Dispense:  540 capsule    Refill:  1  . ranitidine (ZANTAC) 150 MG tablet    Sig: Take 1 tablet (150 mg total) by mouth at bedtime.    Dispense:  90 tablet    Refill:  1  . DULoxetine (CYMBALTA) 60 MG capsule    Sig: Take 2 capsules (120 mg total) by mouth daily.    Dispense:  180 capsule    Refill:  1  . nystatin (MYCOSTATIN) 100000 UNIT/ML suspension    Sig: Take 5 mLs (500,000 Units total) by mouth 4 (four) times daily.    Dispense:  60 mL    Refill:  0    Return in about 2 months (around 04/20/2017) for recheck ANXIETY/DEPRESSION, FIBROMYALGIA, CHRONIC PAIN, NAUSEA.   Kiyana Vazguez Elayne Guerin, M.D. Primary Care at [facility] previously [facility] [place] [address] [place], [place]  [address] [phone] phone [phone] fax

## 2017-02-18 LAB — COMPREHENSIVE METABOLIC PANEL
ALT: 12 IU/L (ref 0–32)
AST: 18 IU/L (ref 0–40)
Albumin/Globulin Ratio: 1.6 (ref 1.2–2.2)
Albumin: 4.2 g/dL (ref 3.6–4.8)
Alkaline Phosphatase: 91 IU/L (ref 39–117)
BUN/Creatinine Ratio: 14 (ref 12–28)
BUN: 14 mg/dL (ref 8–27)
Bilirubin Total: 0.2 mg/dL (ref 0.0–1.2)
CALCIUM: 9.2 mg/dL (ref 8.7–10.3)
CO2: 23 mmol/L (ref 20–29)
CREATININE: 1.03 mg/dL — AB (ref 0.57–1.00)
Chloride: 106 mmol/L (ref 96–106)
GFR calc Af Amer: 68 mL/min/{1.73_m2} (ref >59)
GFR, EST NON AFRICAN AMERICAN: 59 mL/min/{1.73_m2} — AB (ref >59)
GLOBULIN, TOTAL: 2.6 g/dL (ref 1.5–4.5)
Glucose: 95 mg/dL (ref 65–99)
Potassium: 5.1 mmol/L (ref 3.5–5.2)
Sodium: 143 mmol/L (ref 134–144)
Total Protein: 6.8 g/dL (ref 6.0–8.5)

## 2017-02-20 ENCOUNTER — Telehealth: Payer: Self-pay | Admitting: Family Medicine

## 2017-02-20 NOTE — Telephone Encounter (Signed)
Pt is calling to check the status of disability paper work (for insurance) left on tues. 02/17/17; pt states she was informed that ppwrk would be avail. 1-2 days after vist  [phone] please call

## 2017-02-23 ENCOUNTER — Encounter: Payer: Self-pay | Admitting: Family Medicine

## 2017-02-25 ENCOUNTER — Ambulatory Visit: Payer: 59 | Admitting: Family Medicine

## 2017-02-25 ENCOUNTER — Telehealth: Payer: Self-pay | Admitting: Family Medicine

## 2017-02-25 NOTE — Telephone Encounter (Signed)
Duplicate request already sent to Dr. Tamala Julian.

## 2017-02-25 NOTE — Telephone Encounter (Signed)
Pt is calling to see if her insurance form that she left for Dr. Tamala Julian last week is ready to pick up   Best number [phone]

## 2017-02-26 NOTE — Telephone Encounter (Signed)
PT IS CALLING AGAIN ABOUT INSURANCE FORM THAT NEED TO BE FILLED OUT AN TURNED INTO HER INSURANCE  SHE STATES THAT IT NEED TO BE TURNED IN AS SOON AS POSSIBLE BECAUSE HER INSURANCE KEEP CALLING HER

## 2017-03-02 DIAGNOSIS — G43009 Migraine without aura, not intractable, without status migrainosus: Secondary | ICD-10-CM | POA: Insufficient documentation

## 2017-03-03 NOTE — Telephone Encounter (Signed)
Please call patient --- I have misplaced this form.  Please apologize to her; can her insurance company fax [place] another form?

## 2017-03-03 NOTE — Telephone Encounter (Signed)
Dr. Tamala Julian, please review and advise. Thank you./ S.Meylin Stenzel,CMA

## 2017-03-03 NOTE — Telephone Encounter (Signed)
Emailed patient that Dr Tamala Julian will not be back until 03/11/17

## 2017-03-11 ENCOUNTER — Other Ambulatory Visit: Payer: Self-pay | Admitting: Family Medicine

## 2017-03-12 NOTE — Telephone Encounter (Signed)
Pt req ondansetron refill.  Last seen 02/17/2017 Has follow up appt 04/21/2017 Please advise

## 2017-03-13 MED FILL — ONDANSETRON ODT 4 MG TABLET: 4 | 7 days supply | Qty: 20 | Fill #0

## 2017-03-19 MED FILL — DULoxetine HCL 60 MG CPEP: 60 | 90 days supply | Qty: 180 | Fill #0

## 2017-04-13 MED FILL — SHINGRIX 50 MCG SUS: 50 | 1 days supply | Qty: 1 | Fill #1

## 2017-04-17 MED FILL — ONDANSETRON ODT 4 MG TABLET: 4 | 7 days supply | Qty: 20 | Fill #1

## 2017-04-20 NOTE — Progress Notes (Signed)
Subjective:    Patient ID: [name], female    DOB: 1956-07-10, 61 y.o.   MRN: [id number]  04/21/2017  Depression (with Anxiety 2 month follow-up); Fibromyalgia; Pain (refill Oxycodone ); and Medication Refill (Flexeril, zantac and migraine medication)   HPI This 61 y.o. female presents for two month follow-up of chronic pain syndrome, anxiety and depression, and fibromyalgia.  Management changes made at last visit included decreasing Prozac to 40mg  qod and increasing Cymbalta to 120mg  daily. Also refilled Zantac due to ongoing nausea associated with GERD.   Nerves have been shot; children are returning home with their families also.  Children think they can do what they want in your house.  Has had five migraines this month.  Nerves are shot.  Rare oxycodone. Children have been with patient for over one year; also worries about husband who has asthma, COPD.    Nausea is terrible; not sure if nerves in contributing. Certain foods trigger; trying to avoid triggers.   Pain no better with Cymbalta 120mg  daily.   BP Readings from Last 3 Encounters:  04/21/17 103/70  02/17/17 133/85  01/09/17 130/86   Wt Readings from Last 3 Encounters:  04/21/17 258 lb (117 kg)  02/17/17 257 lb (116.6 kg)  01/09/17 250 lb (113.4 kg)   Immunization History  Administered Date(s) Administered  . Influenza,inj,Quad PF,6+ Mos 05/28/2015, 05/21/2016, 04/21/2017  . Tdap 06/25/2015  . Zoster Recombinat (Shingrix) 02/09/2017    Review of Systems  Constitutional: Negative for chills, diaphoresis, fatigue and fever.  Eyes: Negative for visual disturbance.  Respiratory: Negative for cough and shortness of breath.   Cardiovascular: Negative for chest pain, palpitations and leg swelling.  Gastrointestinal: Positive for nausea. Negative for abdominal distention, abdominal pain, anal bleeding, blood in stool, constipation, diarrhea, rectal pain and vomiting.  Endocrine: Negative for cold intolerance, heat  intolerance, polydipsia, polyphagia and polyuria.  Musculoskeletal: Positive for myalgias.  Neurological: Positive for headaches. Negative for dizziness, tremors, seizures, syncope, facial asymmetry, speech difficulty, weakness, light-headedness and numbness.  Psychiatric/Behavioral: Negative for decreased concentration, self-injury, sleep disturbance and suicidal ideas. The patient is nervous/anxious.     Past Medical History:  Diagnosis Date  . Allergy    [name], Flonase  . Anemia   . Anxiety   . Arthritis   . Chronic kidney disease   . Colon polyps   . Depression   . Fibromyalgia   . Gastritis   . GERD (gastroesophageal reflux disease)   . Hernia, hiatal   . HLD (hyperlipidemia)   . Hypertension   . Migraine   . Neuropathy   . Osteoporosis   . Pneumonia   . Sleep apnea with use of continuous positive airway pressure (CPAP)   . Thyroid goiter    Past Surgical History:  Procedure Laterality Date  . ABDOMINAL HYSTERECTOMY     ovaries intact; DUB.  No dysplasia.  Marland Kitchen PARTIAL HYSTERECTOMY    . ROTATOR CUFF REPAIR Right 2009  . SHOULDER SURGERY Right    shoulder dislocation, fall, and elbow unla nerve  . SHOULDER SURGERY Right 2015   labral tear  . TUBAL LIGATION    . ULNAR NERVE REPAIR Right 08/19/2014   Allergies  Allergen Reactions  . Sulfa Antibiotics     Social History   Social History  . Marital status: Married    Spouse name: N/A  . Number of children: 3  . Years of education: N/A   Occupational History  . unemployed    Social History  Main Topics  . Smoking status: Former Smoker    Quit date: 08/04/1998  . Smokeless tobacco: Never Used  . Alcohol use No  . Drug use: No  . Sexual activity: No   Other Topics Concern  . Not on file   Social History Narrative   Marital status: married x 19 years; moderately happy     Children: 3 children ([place], [place], 39 April); 8 grandchildren; 0 gg     Lives: with husband/Steve, April, 2 granddaughters       Employment:  Unemployed; quit working 2015 with work related injury; awaiting disability in 2018; disability approved in 02/2017.       Tobacco: quit smoking in 2016; smoked x 2 years.      Alcohol: none      Drugs: none      Exercise: rarely in 2018         Family History  Problem Relation Age of Onset  . Heart disease Mother 44       AMI/CAD/CHF as cause of death  . Hypertension Mother   . Hyperlipidemia Mother   . Hypothyroidism Mother   . Depression Mother   . Gout Mother   . Heart disease Father 56       AMI  . Hypertension Father   . Stroke Father 34       mild CVA  . Prostate cancer Father   . Hyperlipidemia Father   . Cancer Father 61       prostate cancer  . Hypertension Brother   . Hyperlipidemia Brother   . Cancer Brother        prostate cancer  . Cancer Maternal Grandmother        type unknown  . Cancer Maternal Grandfather        type unknown  . Heart disease Paternal Grandmother   . Heart defect Paternal Grandfather   . Melanoma Brother   . Hyperlipidemia Brother   . Hypertension Brother   . Cancer Brother        melanoma  . Hypertension Brother   . Hyperlipidemia Brother   . Melanoma Brother   . Cancer Brother 25       melanoma  . Cancer Sister        Basal cell carcinoma scalp  . Depression Sister   . Hypertension Brother   . Depression Brother   . Hypertension Brother   . Gout Brother   . Arthritis Sister   . Depression Sister        Objectiv:    BP 103/70   Pulse 63   Temp 98 F (36.7 C) (Oral)   Resp 16   Ht 5' 4.57" (1.64 m)   Wt 258 lb (117 kg)   SpO2 94%   BMI 43.51 kg/m  Physical Exam  Constitutional: She is oriented to person, place, and time. She appears well-developed and well-nourished. No distress.  HENT:  Head: Normocephalic and atraumatic.  Right Ear: External ear normal.  Left Ear: External ear normal.  Nose: Nose normal.  Mouth/Throat: Oropharynx is clear and moist.  Eyes: Pupils are equal, round, and  reactive to light. Conjunctivae and EOM are normal.  Neck: Normal range of motion. Neck supple. Carotid bruit is not present. No thyromegaly present.  Cardiovascular: Normal rate, regular rhythm, normal heart sounds and intact distal pulses.  Exam reveals no gallop and no friction rub.   No murmur heard. Pulmonary/Chest: Effort normal and breath sounds normal. She has  no wheezes. She has no rales.  Abdominal: Soft. Bowel sounds are normal. She exhibits no distension and no mass. There is no tenderness. There is no rebound and no guarding.  Lymphadenopathy:    She has no cervical adenopathy.  Neurological: She is alert and oriented to person, place, and time. No cranial nerve deficit.  Skin: Skin is warm and dry. No rash noted. She is not diaphoretic. No erythema. No pallor.  Psychiatric: She has a normal mood and affect. Her behavior is normal.    No results found. Depression screen [facility] 2/9 04/21/2017 01/09/2017 10/14/2016 08/12/2016 07/15/2016  Decreased Interest 1 3 0 0 3  Down, Depressed, Hopeless 1 3 0 0 3  PHQ - 2 Score 2 6 0 0 6  Altered sleeping 1 3 - 0 1  Tired, decreased energy 1 3 - 0 3  Change in appetite 0 2 - 0 2  Feeling bad or failure about yourself  1 3 - 0 3  Trouble concentrating 0 3 - 0 2  Moving slowly or fidgety/restless 0 3 - 0 2  Suicidal thoughts 0 0 - 0 0  PHQ-9 Score 5 23 - 0 19  Difficult doing work/chores - Somewhat difficult - - Somewhat difficult   Fall Risk  04/21/2017 02/17/2017 01/09/2017 10/14/2016 08/12/2016  Falls in the past year? No Yes No No Yes  Number falls in past yr: - - - - -  Injury with Fall? - - - - -  [place]:   1. Chronic pain syndrome   2. Adjustment disorder with mixed anxiety and depressed mood   3. Fibromyalgia   4. Gastroesophageal reflux disease without esophagitis   5. Migraine without aura and without status migrainosus, not intractable   6. Hiatal hernia   7. Need for prophylactic vaccination  and inoculation against influenza    -worsening migraines with increased family stressors; increase Topamax to two qhs.  Rx for Fioricet provided. -stop Prozac now that taking Cymbalta 120mg  daily for anxiety/depression. -refill of Flexeril and Percocet provided for chronic pain syndrome; utilizing one rx for Percocet every three months. -refill of Zantac provided; suffers with chronic nausea; have discontinued Prozac and Wellbutrin; GI recommended stopping Topamax yet unable to wean at this time due to worsening migraines.   Orders Placed This Encounter  Procedures  . Flu Vaccine QUAD 36+ mos IM   Meds ordered this encounter  Medications  . DISCONTD: butalbital-acetaminophen-caffeine (FIORICET WITH CODEINE) 50-325-40-30 MG capsule    Sig: Take 1 capsule by mouth every 4 (four) hours as needed for headache.  . cyclobenzaprine (FLEXERIL) 10 MG tablet    Sig: Take 0.5-1 tablets (5-10 mg total) by mouth 3 (three) times daily as needed for muscle spasms.    Dispense:  180 tablet    Refill:  1  . ranitidine (ZANTAC) 150 MG tablet    Sig: Take 1 tablet (150 mg total) by mouth at bedtime.    Dispense:  90 tablet    Refill:  1  . oxyCODONE-acetaminophen (PERCOCET/ROXICET) 5-325 MG tablet    Sig: Take 1 tablet by mouth every 12 (twelve) hours as needed for moderate pain or severe pain.    Dispense:  30 tablet    Refill:  0  . butalbital-acetaminophen-caffeine (FIORICET, ESGIC) 50-325-40 MG tablet    Sig: Take 1-2 tablets by mouth every 6 (six) hours as needed for headache.  Dispense:  40 tablet    Refill:  0    Return in about 3 months (around 07/21/2017) for recheck anxiety, chronic pain, fibromyalgia.   Sharonna Vinje Elayne Guerin, M.D. Primary Care at [facility] previously [facility] [place] [address] [place], [place]  [address] [phone] phone [phone] fax

## 2017-04-21 ENCOUNTER — Encounter: Payer: Self-pay | Admitting: Family Medicine

## 2017-04-21 ENCOUNTER — Ambulatory Visit (INDEPENDENT_AMBULATORY_CARE_PROVIDER_SITE_OTHER): Payer: 59 | Admitting: Family Medicine

## 2017-04-21 VITALS — BP 103/70 | HR 63 | Temp 98.0°F | Resp 16 | Ht 64.57 in | Wt 258.0 lb

## 2017-04-21 DIAGNOSIS — K449 Diaphragmatic hernia without obstruction or gangrene: Secondary | ICD-10-CM

## 2017-04-21 DIAGNOSIS — Z23 Encounter for immunization: Secondary | ICD-10-CM | POA: Diagnosis not present

## 2017-04-21 DIAGNOSIS — K219 Gastro-esophageal reflux disease without esophagitis: Secondary | ICD-10-CM | POA: Diagnosis not present

## 2017-04-21 DIAGNOSIS — M797 Fibromyalgia: Secondary | ICD-10-CM

## 2017-04-21 DIAGNOSIS — F4323 Adjustment disorder with mixed anxiety and depressed mood: Secondary | ICD-10-CM

## 2017-04-21 DIAGNOSIS — G43009 Migraine without aura, not intractable, without status migrainosus: Secondary | ICD-10-CM

## 2017-04-21 DIAGNOSIS — G894 Chronic pain syndrome: Secondary | ICD-10-CM

## 2017-04-21 MED ORDER — RANITIDINE HCL 150 MG PO TABS: 150.0000 mg | ORAL_TABLET | Freq: Every day | ORAL | 1 refills | Status: DC

## 2017-04-21 MED ORDER — CYCLOBENZAPRINE HCL 10 MG PO TABS: 5.0000 mg | ORAL_TABLET | Freq: Three times a day (TID) | ORAL | 1 refills | Status: DC | PRN

## 2017-04-21 MED ORDER — BUTALBITAL-APAP-CAFFEINE 50-325-40 MG PO TABS: 1.0000 | ORAL_TABLET | Freq: Four times a day (QID) | ORAL | 0 refills | Status: AC | PRN

## 2017-04-21 MED ORDER — OXYCODONE-ACETAMINOPHEN 5-325 MG PO TABS
1.0000 | ORAL_TABLET | Freq: Two times a day (BID) | ORAL | 0 refills | Status: DC | PRN
Start: 2017-04-21 — End: 2017-08-14

## 2017-04-21 MED FILL — CYCLOBENZAPRINE 10 MG TAB: 10 | 60 days supply | Qty: 180 | Fill #0

## 2017-04-21 MED FILL — BUTALB-ACETAMIN-CAFF 50-325: 50-325-40 | 5 days supply | Qty: 40 | Fill #0

## 2017-04-21 MED FILL — OXYCODONE W/APAP 5/325 TAB: 5-325 | 15 days supply | Qty: 30 | Fill #0

## 2017-04-21 MED FILL — raNITIdine HCL 150 MG TABS: 150 | 90 days supply | Qty: 90 | Fill #0

## 2017-04-21 NOTE — Patient Instructions (Addendum)
  Stop Prozac/Fluoxetine 40mg  every other day.  Continue Cymbalta 120mg  daily. Increase Topiramate to TWO at bedtime.      IF you received an x-ray today, you will receive an invoice from [facility]. Please contact [facility] at [phone] with questions or concerns regarding your invoice.   IF you received labwork today, you will receive an invoice from [place]. Please contact [facility] at [phone] with questions or concerns regarding your invoice.   Our billing staff will not be able to assist you with questions regarding bills from these companies.  You will be contacted with the lab results as soon as they are available. The fastest way to get your results is to activate your My Chart account. Instructions are located on the last page of this paperwork. If you have not heard from [place] regarding the results in 2 weeks, please contact this office.

## 2017-05-07 MED FILL — ATORVASTATIN 10 MG TABLET: 10 | 90 days supply | Qty: 90 | Fill #1

## 2017-05-07 MED FILL — DARIFENACIN ER 15 MG TABLET: 15 | 90 days supply | Qty: 90 | Fill #1

## 2017-05-07 MED FILL — ONDANSETRON ODT 4 MG TABLET: 4 | 7 days supply | Qty: 20 | Fill #2

## 2017-05-07 MED FILL — DEXILANT DR 60 MG CAPSULE: 60 | 90 days supply | Qty: 90 | Fill #1

## 2017-05-07 MED FILL — TOPIRAMATE 50 MG TABLET: 50 | 45 days supply | Qty: 90 | Fill #3

## 2017-05-07 MED FILL — LISINOPRIL 20 MG TABS: 20 | 90 days supply | Qty: 90 | Fill #1

## 2017-05-07 MED FILL — PROPRANOLOL 60 MG TABLET: 60 | 90 days supply | Qty: 180 | Fill #1

## 2017-05-13 ENCOUNTER — Encounter: Payer: Self-pay | Admitting: Family Medicine

## 2017-05-13 ENCOUNTER — Ambulatory Visit (INDEPENDENT_AMBULATORY_CARE_PROVIDER_SITE_OTHER): Payer: 59 | Admitting: Family Medicine

## 2017-05-13 VITALS — BP 140/82 | HR 87 | Temp 98.0°F | Resp 16 | Ht 64.57 in | Wt 257.0 lb

## 2017-05-13 DIAGNOSIS — Z6841 Body Mass Index (BMI) 40.0 and over, adult: Secondary | ICD-10-CM

## 2017-05-13 DIAGNOSIS — R000 Tachycardia, unspecified: Secondary | ICD-10-CM | POA: Diagnosis not present

## 2017-05-13 DIAGNOSIS — E66813 Obesity, class 3: Secondary | ICD-10-CM

## 2017-05-13 DIAGNOSIS — R5383 Other fatigue: Secondary | ICD-10-CM

## 2017-05-13 DIAGNOSIS — R42 Dizziness and giddiness: Secondary | ICD-10-CM

## 2017-05-13 DIAGNOSIS — I10 Essential (primary) hypertension: Secondary | ICD-10-CM | POA: Diagnosis not present

## 2017-05-13 DIAGNOSIS — F4323 Adjustment disorder with mixed anxiety and depressed mood: Secondary | ICD-10-CM | POA: Diagnosis not present

## 2017-05-13 DIAGNOSIS — M797 Fibromyalgia: Secondary | ICD-10-CM

## 2017-05-13 DIAGNOSIS — G894 Chronic pain syndrome: Secondary | ICD-10-CM

## 2017-05-13 LAB — POCT URINALYSIS DIP (MANUAL ENTRY)
BILIRUBIN UA: NEGATIVE mg/dL
Bilirubin, UA: NEGATIVE
Glucose, UA: NEGATIVE mg/dL
NITRITE UA: NEGATIVE
PH UA: 7 (ref 5.0–8.0)
PROTEIN UA: NEGATIVE mg/dL
RBC UA: NEGATIVE
Spec Grav, UA: 1.015 (ref 1.010–1.025)
Urobilinogen, UA: 1 E.U./dL

## 2017-05-13 LAB — GLUCOSE, POCT (MANUAL RESULT ENTRY): POC Glucose: 108 mg/dl — AB (ref 70–99)

## 2017-05-13 NOTE — Progress Notes (Signed)
Subjective:    Patient ID: [name], female    DOB: 09-01-1955, 61 y.o.   MRN: [id number]  05/13/2017  Hypertension (follow-up) and Fatigue (with some dizziness)    HPI This 61 y.o. female presents for evaluation of shakiness and fatigue and dizziness.  Has not felt well since 05/07/17.  Felt really dizzy one night.  Felt like going to pass out.  Got really dizzy after shower; got dizzy in the shower.  When getting out of shower, felt like going to pass out and got nauseated.  Jittery also.  BP since 05/07/17 has been normal.  Pulse ranging 100-130.  Mother with AMI at age 12 years old.  Sisters saw patient over the weekend; looked pale.  Pain in RUQ to R upper back.  Knows gallbladder in RUQ.  Sore all over.  Arms feel sore; lower pelvic pain.  Assumes anxiety due to daughte with boyfirend living with patient.  Has been SOB; sometimes notices getting SOB from house to the car; fifty feet.  Can feel heart racing.  Tachycardia feels like a flutter; not common.  Has had URI.  Improved now.  Mild cough.  Onset of URI last week. Taking cough syrup; husband listened to patient and clear.  Stopped Prozac three weeks ago.  Feeling jittery.  Nervous about house guests.  Has returned to church twice since last visit which is a good sign.  Daughter is being irresponsible. Caring for granddaughters; pt supporting grandchildren.  S/p echo and stress testing more than two years and likely five years ago.  Isaias Cowman, PA with [facility].  Septum hypertrophy.    -worsening migraines with increased family stressors; increase Topamax to two qhs.  Rx for Fioricet provided. -stop Prozac now that taking Cymbalta 120mg  daily for anxiety/depression. -refill of Flexeril and Percocet provided for chronic pain syndrome; utilizing one rx for Percocet every three months. -refill of Zantac provided; suffers with chronic nausea; have discontinued Prozac and Wellbutrin; GI recommended stopping Topamax yet unable to wean at  this time due to worsening migraines.  BP Readings from Last 3 Encounters:  05/13/17 140/82  04/21/17 103/70  02/17/17 133/85   Wt Readings from Last 3 Encounters:  05/13/17 257 lb (116.6 kg)  04/21/17 258 lb (117 kg)  02/17/17 257 lb (116.6 kg)   Immunization History  Administered Date(s) Administered  . Influenza,inj,Quad PF,6+ Mos 05/28/2015, 05/21/2016, 04/21/2017  . Tdap 06/25/2015  . Zoster Recombinat (Shingrix) 02/09/2017, 04/21/2017    Review of Systems  Constitutional: Negative for chills, diaphoresis, fatigue and fever.  Eyes: Negative for visual disturbance.  Respiratory: Negative for cough and shortness of breath.   Cardiovascular: Positive for palpitations. Negative for chest pain and leg swelling.  Gastrointestinal: Negative for abdominal pain, constipation, diarrhea, nausea and vomiting.  Endocrine: Negative for cold intolerance, heat intolerance, polydipsia, polyphagia and polyuria.  Neurological: Positive for dizziness, tremors and light-headedness. Negative for seizures, syncope, facial asymmetry, speech difficulty, weakness, numbness and headaches.  Psychiatric/Behavioral: Negative for dysphoric mood. The patient is nervous/anxious.     Past Medical History:  Diagnosis Date  . Allergy    [name], Flonase  . Anemia   . Anxiety   . Arthritis   . Chronic kidney disease   . Colon polyps   . Depression   . Fibromyalgia   . Gastritis   . GERD (gastroesophageal reflux disease)   . Hernia, hiatal   . HLD (hyperlipidemia)   . Hypertension   . Migraine   . Neuropathy   .  Osteoporosis   . Pneumonia   . Sleep apnea with use of continuous positive airway pressure (CPAP)   . Thyroid goiter    Past Surgical History:  Procedure Laterality Date  . ABDOMINAL HYSTERECTOMY     ovaries intact; DUB.  No dysplasia.  Marland Kitchen PARTIAL HYSTERECTOMY    . ROTATOR CUFF REPAIR Right 2009  . SHOULDER SURGERY Right    shoulder dislocation, fall, and elbow unla nerve  .  SHOULDER SURGERY Right 2015   labral tear  . TUBAL LIGATION    . ULNAR NERVE REPAIR Right 08/19/2014   Allergies  Allergen Reactions  . Sulfa Antibiotics    Current Outpatient Prescriptions on File Prior to Visit  Medication Sig Dispense Refill  . atorvastatin (LIPITOR) 10 MG tablet TAKE 1 TABLET (10 MG TOTAL) BY MOUTH DAILY AT 6 PM. 90 tablet 1  . butalbital-acetaminophen-caffeine (FIORICET, ESGIC) 50-325-40 MG tablet Take 1-2 tablets by mouth every 6 (six) hours as needed for headache. 40 tablet 0  . cyclobenzaprine (FLEXERIL) 10 MG tablet Take 0.5-1 tablets (5-10 mg total) by mouth 3 (three) times daily as needed for muscle spasms. 180 tablet 1  . darifenacin (ENABLEX) 15 MG 24 hr tablet TAKE 1 TABLET BY MOUTH ONCE DAILY 90 tablet 1  . dexlansoprazole (DEXILANT) 60 MG capsule Take 1 capsule (60 mg total) by mouth daily. 90 capsule 1  . DULoxetine (CYMBALTA) 60 MG capsule Take 2 capsules (120 mg total) by mouth daily. 180 capsule 1  . gabapentin (NEURONTIN) 300 MG capsule Take 1-2 capsules (300-600 mg total) by mouth 4 (four) times daily. 540 capsule 1  . lisinopril (PRINIVIL,ZESTRIL) 20 MG tablet Take 1 tablet (20 mg total) by mouth daily. 90 tablet 1  . ondansetron (ZOFRAN-ODT) 4 MG disintegrating tablet DISSOLVE 1 TABLET BY MOUTH EVERY 8 HOURS AS NEEDED FOR NAUSEA OR VOMITING. 20 tablet 4  . oxyCODONE-acetaminophen (PERCOCET/ROXICET) 5-325 MG tablet Take 1 tablet by mouth every 12 (twelve) hours as needed for moderate pain or severe pain. 30 tablet 0  . propranolol (INDERAL) 60 MG tablet Take 1 tablet (60 mg total) by mouth 2 (two) times daily. 180 tablet 1  . ranitidine (ZANTAC) 150 MG tablet Take 1 tablet (150 mg total) by mouth at bedtime. 90 tablet 1  . topiramate (TOPAMAX) 50 MG tablet TAKE 1-2 TABLETS BY MOUTH NIGHTLY FOR HEADACHE PREVENTION 90 tablet 3   No current facility-administered medications on file prior to visit.    Social History   Social History  . Marital status:  Married    Spouse name: N/A  . Number of children: 3  . Years of education: N/A   Occupational History  . unemployed    Social History Main Topics  . Smoking status: Former Smoker    Quit date: 08/04/1998  . Smokeless tobacco: Never Used  . Alcohol use No  . Drug use: No  . Sexual activity: No   Other Topics Concern  . Not on file   Social History Narrative   Marital status: married x 19 years; moderately happy     Children: 3 children ([place], [place], 39 April); 8 grandchildren; 0 gg     Lives: with husband/Steve, April, 2 granddaughters     Employment:  Unemployed; quit working 2015 with work related injury; awaiting disability in 2018; disability approved in 02/2017.       Tobacco: quit smoking in 2016; smoked x 2 years.      Alcohol: none  Drugs: none      Exercise: rarely in 2018         Family History  Problem Relation Age of Onset  . Heart disease Mother 21       AMI/CAD/CHF as cause of death  . Hypertension Mother   . Hyperlipidemia Mother   . Hypothyroidism Mother   . Depression Mother   . Gout Mother   . Heart disease Father 38       AMI  . Hypertension Father   . Stroke Father 43       mild CVA  . Prostate cancer Father   . Hyperlipidemia Father   . Cancer Father 52       prostate cancer  . Hypertension Brother   . Hyperlipidemia Brother   . Cancer Brother        prostate cancer  . Cancer Maternal Grandmother        type unknown  . Cancer Maternal Grandfather        type unknown  . Heart disease Paternal Grandmother   . Heart defect Paternal Grandfather   . Melanoma Brother   . Hyperlipidemia Brother   . Hypertension Brother   . Cancer Brother        melanoma  . Hypertension Brother   . Hyperlipidemia Brother   . Melanoma Brother   . Cancer Brother 25       melanoma  . Cancer Sister        Basal cell carcinoma scalp  . Depression Sister   . Hypertension Brother   . Depression Brother   . Hypertension Brother   . Gout Brother    . Arthritis Sister   . Depression Sister        Objective:    BP 140/82   Pulse 87   Temp 98 F (36.7 C) (Oral)   Resp 16   Ht 5' 4.57" (1.64 m)   Wt 257 lb (116.6 kg)   SpO2 97%   BMI 43.34 kg/m  Physical Exam  Constitutional: She is oriented to person, place, and time. She appears well-developed and well-nourished. No distress.  HENT:  Head: Normocephalic and atraumatic.  Right Ear: External ear normal.  Left Ear: External ear normal.  Nose: Nose normal.  Mouth/Throat: Oropharynx is clear and moist.  Eyes: Pupils are equal, round, and reactive to light. Conjunctivae and EOM are normal.  Neck: Normal range of motion. Neck supple. Carotid bruit is not present. No thyromegaly present.  Cardiovascular: Normal rate, regular rhythm, normal heart sounds and intact distal pulses.  Exam reveals no gallop and no friction rub.   No murmur heard. Pulmonary/Chest: Effort normal and breath sounds normal. She has no wheezes. She has no rales.  Abdominal: Soft. Bowel sounds are normal. She exhibits no distension and no mass. There is no tenderness. There is no rebound and no guarding.  Lymphadenopathy:    She has no cervical adenopathy.  Neurological: She is alert and oriented to person, place, and time. No cranial nerve deficit. She exhibits normal muscle tone. Coordination normal.  Skin: Skin is warm and dry. No rash noted. She is not diaphoretic. No erythema. No pallor.  Psychiatric: She has a normal mood and affect. Her behavior is normal. Judgment and thought content normal.  Nursing note and vitals reviewed.  No results found. Depression screen [facility] 2/9 05/13/2017 04/21/2017 01/09/2017 10/14/2016 08/12/2016  Decreased Interest 0 1 3 0 0  Down, Depressed, Hopeless 0 1 3 0 0  PHQ -  2 Score 0 2 6 0 0  Altered sleeping - 1 3 - 0  Tired, decreased energy - 1 3 - 0  Change in appetite - 0 2 - 0  Feeling bad or failure about yourself  - 1 3 - 0  Trouble concentrating - 0 3 - 0  Moving  slowly or fidgety/restless - 0 3 - 0  Suicidal thoughts - 0 0 - 0  PHQ-9 Score - 5 23 - 0  Difficult doing work/chores - - Somewhat difficult - -   Fall Risk  05/13/2017 04/21/2017 02/17/2017 01/09/2017 10/14/2016  Falls in the past year? No No Yes No No  Number falls in past yr: - - - - -  Injury with [place]:   1. Tachycardia   2. Other fatigue   3. Lightheaded   4. Essential hypertension   5. Adjustment disorder with mixed anxiety and depressed mood   6. Chronic pain syndrome   7. Class 3 severe obesity due to excess calories with serious comorbidity and body mass index (BMI) of 40.0 to 44.9 in adult ([place])   8. Fibromyalgia    -New onset tachycardia with fatigue and lightheaded; obtain labs; EKG is stable.  -refer to cardiology due to tachycardia new onset. -recent increase in Cymbalta to 120mg ; decrease to 60mg  daily as may be suffering with Cymbalta side effects. -obtain labs to rule out secondary causes. -To ED for acute worsening.    Orders Placed This Encounter  Procedures  . Urine Culture  . Comprehensive metabolic panel  . CBC with Differential/Platelet  . TSH  . Ambulatory referral to Cardiology    Referral Priority:   Routine    Referral Type:   Consultation    Referral Reason:   Specialty Services Required    Requested Specialty:   Cardiology    Number of Visits Requested:   1  . POCT urinalysis dipstick  . POCT glucose (manual entry)  . EKG 12-Lead   Meds ordered this encounter  Medications  . amoxicillin-clavulanate (AUGMENTIN) 875-125 MG tablet    Sig: Take 1 tablet by mouth 2 (two) times daily.    Dispense:  20 tablet    Refill:  0    Return in about 2 weeks (around 05/27/2017) for recheck.   Elchonon Maxson Elayne Guerin, M.D. Primary Care at [facility] previously Urgent [place] [address] [place], [place]  [phone] phone [phone] fax

## 2017-05-13 NOTE — Patient Instructions (Addendum)
   DECREASE CYMBALTA 60MG  TO ONE TABLET DAILY.  CONTINUE TO MONITOR BLOOD PRESSURE AND PULSE/HEART RATE DAILY.    IF you received an x-ray today, you will receive an invoice from [facility]. Please contact [facility] at [phone] with questions or concerns regarding your invoice.   IF you received labwork today, you will receive an invoice from [facility]. Please contact [facility] at [phone] with questions or concerns regarding your invoice.   Our billing staff will not be able to assist you with questions regarding bills from these companies.  You will be contacted with the lab results as soon as they are available. The fastest way to get your results is to activate your My Chart account. Instructions are located on the last page of this paperwork. If you have not heard from [place] regarding the results in 2 weeks, please contact this office.

## 2017-05-14 LAB — CBC WITH DIFFERENTIAL/PLATELET
Basophils Absolute: 0 10*3/uL (ref 0.0–0.2)
Basos: 0 %
EOS (ABSOLUTE): 0.2 10*3/uL (ref 0.0–0.4)
EOS: 3 %
HEMATOCRIT: 34.8 % (ref 34.0–46.6)
HEMOGLOBIN: 11.7 g/dL (ref 11.1–15.9)
Immature Grans (Abs): 0 10*3/uL (ref 0.0–0.1)
Immature Granulocytes: 0 %
LYMPHS ABS: 1.7 10*3/uL (ref 0.7–3.1)
Lymphs: 31 %
MCH: 28 pg (ref 26.6–33.0)
MCHC: 33.6 g/dL (ref 31.5–35.7)
MCV: 83 fL (ref 79–97)
MONOCYTES: 7 %
Monocytes Absolute: 0.4 10*3/uL (ref 0.1–0.9)
NEUTROS ABS: 3.2 10*3/uL (ref 1.4–7.0)
Neutrophils: 59 %
Platelets: 263 10*3/uL (ref 150–379)
RBC: 4.18 x10E6/uL (ref 3.77–5.28)
RDW: 14.4 % (ref 12.3–15.4)
WBC: 5.5 10*3/uL (ref 3.4–10.8)

## 2017-05-14 LAB — COMPREHENSIVE METABOLIC PANEL
ALBUMIN: 4.3 g/dL (ref 3.6–4.8)
ALT: 13 IU/L (ref 0–32)
AST: 21 IU/L (ref 0–40)
Albumin/Globulin Ratio: 1.7 (ref 1.2–2.2)
Alkaline Phosphatase: 91 IU/L (ref 39–117)
BUN / CREAT RATIO: 13 (ref 12–28)
BUN: 11 mg/dL (ref 8–27)
CHLORIDE: 106 mmol/L (ref 96–106)
CO2: 21 mmol/L (ref 20–29)
CREATININE: 0.87 mg/dL (ref 0.57–1.00)
Calcium: 9.1 mg/dL (ref 8.7–10.3)
GFR calc non Af Amer: 72 mL/min/{1.73_m2} (ref >59)
GFR, EST AFRICAN AMERICAN: 83 mL/min/{1.73_m2} (ref >59)
GLUCOSE: 101 mg/dL — AB (ref 65–99)
Globulin, Total: 2.6 g/dL (ref 1.5–4.5)
Potassium: 5 mmol/L (ref 3.5–5.2)
Sodium: 140 mmol/L (ref 134–144)
TOTAL PROTEIN: 6.9 g/dL (ref 6.0–8.5)

## 2017-05-14 LAB — TSH: TSH: 0.797 u[IU]/mL (ref 0.450–4.500)

## 2017-05-15 LAB — URINE CULTURE

## 2017-05-15 MED ORDER — AMOXICILLIN-POT CLAVULANATE 875-125 MG PO TABS: 1.0000 | ORAL_TABLET | Freq: Two times a day (BID) | ORAL | 0 refills | Status: DC

## 2017-05-25 ENCOUNTER — Encounter: Payer: Self-pay | Admitting: Family Medicine

## 2017-05-25 ENCOUNTER — Ambulatory Visit (INDEPENDENT_AMBULATORY_CARE_PROVIDER_SITE_OTHER): Payer: 59 | Admitting: Family Medicine

## 2017-05-25 VITALS — BP 122/78 | HR 66 | Temp 98.0°F | Resp 16 | Ht 64.96 in | Wt 260.0 lb

## 2017-05-25 DIAGNOSIS — E7800 Pure hypercholesterolemia, unspecified: Secondary | ICD-10-CM | POA: Diagnosis not present

## 2017-05-25 DIAGNOSIS — R000 Tachycardia, unspecified: Secondary | ICD-10-CM | POA: Diagnosis not present

## 2017-05-25 DIAGNOSIS — G43009 Migraine without aura, not intractable, without status migrainosus: Secondary | ICD-10-CM | POA: Diagnosis not present

## 2017-05-25 DIAGNOSIS — G894 Chronic pain syndrome: Secondary | ICD-10-CM

## 2017-05-25 DIAGNOSIS — K219 Gastro-esophageal reflux disease without esophagitis: Secondary | ICD-10-CM | POA: Diagnosis not present

## 2017-05-25 DIAGNOSIS — F4323 Adjustment disorder with mixed anxiety and depressed mood: Secondary | ICD-10-CM | POA: Diagnosis not present

## 2017-05-25 DIAGNOSIS — M797 Fibromyalgia: Secondary | ICD-10-CM | POA: Diagnosis not present

## 2017-05-25 DIAGNOSIS — I10 Essential (primary) hypertension: Secondary | ICD-10-CM

## 2017-05-25 MED ORDER — SERTRALINE HCL 50 MG PO TABS: 50.0000 mg | ORAL_TABLET | Freq: Every day | ORAL | 1 refills | Status: DC

## 2017-05-25 MED FILL — SERTRALINE HCL 50 MG TABLET: 50 | 90 days supply | Qty: 90 | Fill #0

## 2017-05-25 NOTE — Progress Notes (Signed)
Subjective:    Patient ID: [name], female    DOB: July 15, 1956, 61 y.o.   MRN: [id number]  05/25/2017  Hypertension (pt states her B/P has been good since last visit ) and Migraine (pt states she still have some dizzines )    HPI This 61 y.o. female presents for two week follow-up of tachycardia, dizziness, fatigue.  Management changes made at last visit included:   -New onset tachycardia with fatigue and lightheaded; obtain labs; EKG is stable.  -refer to cardiology due to tachycardia new onset. -recent increase in Cymbalta to 120mg ; decrease to 60mg  daily as may be suffering with Cymbalta side effects. -obtain labs to rule out secondary causes. -To ED for acute worsening.   Lab results as follows: No evidence of anemia. Sugar is normal. Liver and kidney functions are normal. Electrolytes (sodium and potassium) are normal. Thyroid function is normal.  Less dizzy from last visit.  50% improved.   Getting depressed but not sure why.  Thinks due to decrease in Cymbalta. Dry mouth is persistent; no improvement with decrease in Cymbalta.   Tachycardia has resolved unless gets upset.   Dexilant is really expensive; still has reflux; nausea seems related to reflux.    Takes Enablex; has urinary urgency.   Horrible fatigue is the same.  A couple of nights, cannot sleep.  Lately has wanted to sleep all the time; does not want to get up.  Zoe came in to wake up patient.  Previously took Prozac, Wellbutrin.  No other medications. Daughter takes Zoloft and works well.  Feels that stress major contributing factor to recent symptoms.  Really needs daughter to move out.   Trying to get boyfriend to get a drive.  Boyfriend does not have license.   There is a bus stop near house.     BP Readings from Last 3 Encounters:  05/25/17 122/78  05/13/17 140/82  04/21/17 103/70   Wt Readings from Last 3 Encounters:  05/25/17 260 lb (117.9 kg)  05/13/17 257 lb (116.6 kg)  04/21/17 258 lb  (117 kg)   Immunization History  Administered Date(s) Administered  . Influenza,inj,Quad PF,6+ Mos 05/28/2015, 05/21/2016, 04/21/2017  . Tdap 06/25/2015  . Zoster Recombinat (Shingrix) 02/09/2017, 04/21/2017    Review of Systems  Constitutional: Positive for fatigue. Negative for chills, diaphoresis and fever.  Eyes: Negative for visual disturbance.  Respiratory: Negative for cough and shortness of breath.   Cardiovascular: Negative for chest pain, palpitations and leg swelling.  Gastrointestinal: Negative for abdominal pain, constipation, diarrhea, nausea and vomiting.  Endocrine: Negative for cold intolerance, heat intolerance, polydipsia, polyphagia and polyuria.  Genitourinary: Positive for urgency.  Neurological: Positive for dizziness. Negative for tremors, seizures, syncope, facial asymmetry, speech difficulty, weakness, light-headedness, numbness and headaches.  Psychiatric/Behavioral: Positive for dysphoric mood. Negative for sleep disturbance. The patient is nervous/anxious.     Past Medical History:  Diagnosis Date  . Allergy    [name], Flonase  . Anemia   . Anxiety   . Arthritis   . Chronic kidney disease   . Colon polyps   . Depression   . Fibromyalgia   . Gastritis   . GERD (gastroesophageal reflux disease)   . Hernia, hiatal   . HLD (hyperlipidemia)   . Hypertension   . Migraine   . Neuropathy   . Osteoporosis   . Pneumonia   . Sleep apnea with use of continuous positive airway pressure (CPAP)   . Thyroid goiter    Past Surgical  History:  Procedure Laterality Date  . ABDOMINAL HYSTERECTOMY     ovaries intact; DUB.  No dysplasia.  Marland Kitchen PARTIAL HYSTERECTOMY    . ROTATOR CUFF REPAIR Right 2009  . SHOULDER SURGERY Right    shoulder dislocation, fall, and elbow unla nerve  . SHOULDER SURGERY Right 2015   labral tear  . TUBAL LIGATION    . ULNAR NERVE REPAIR Right 08/19/2014   Allergies  Allergen Reactions  . Sulfa Antibiotics    Current Outpatient  Prescriptions on File Prior to Visit  Medication Sig Dispense Refill  . atorvastatin (LIPITOR) 10 MG tablet TAKE 1 TABLET (10 MG TOTAL) BY MOUTH DAILY AT 6 PM. 90 tablet 1  . butalbital-acetaminophen-caffeine (FIORICET, ESGIC) 50-325-40 MG tablet Take 1-2 tablets by mouth every 6 (six) hours as needed for headache. 40 tablet 0  . cyclobenzaprine (FLEXERIL) 10 MG tablet Take 0.5-1 tablets (5-10 mg total) by mouth 3 (three) times daily as needed for muscle spasms. 180 tablet 1  . darifenacin (ENABLEX) 15 MG 24 hr tablet TAKE 1 TABLET BY MOUTH ONCE DAILY 90 tablet 1  . dexlansoprazole (DEXILANT) 60 MG capsule Take 1 capsule (60 mg total) by mouth daily. 90 capsule 1  . DULoxetine (CYMBALTA) 60 MG capsule Take 2 capsules (120 mg total) by mouth daily. 180 capsule 1  . gabapentin (NEURONTIN) 300 MG capsule Take 1-2 capsules (300-600 mg total) by mouth 4 (four) times daily. 540 capsule 1  . lisinopril (PRINIVIL,ZESTRIL) 20 MG tablet Take 1 tablet (20 mg total) by mouth daily. 90 tablet 1  . ondansetron (ZOFRAN-ODT) 4 MG disintegrating tablet DISSOLVE 1 TABLET BY MOUTH EVERY 8 HOURS AS NEEDED FOR NAUSEA OR VOMITING. 20 tablet 4  . oxyCODONE-acetaminophen (PERCOCET/ROXICET) 5-325 MG tablet Take 1 tablet by mouth every 12 (twelve) hours as needed for moderate pain or severe pain. 30 tablet 0  . propranolol (INDERAL) 60 MG tablet Take 1 tablet (60 mg total) by mouth 2 (two) times daily. 180 tablet 1  . ranitidine (ZANTAC) 150 MG tablet Take 1 tablet (150 mg total) by mouth at bedtime. 90 tablet 1  . topiramate (TOPAMAX) 50 MG tablet TAKE 1-2 TABLETS BY MOUTH NIGHTLY FOR HEADACHE PREVENTION 90 tablet 3   No current facility-administered medications on file prior to visit.    Social History   Social History  . Marital status: Married    Spouse name: N/A  . Number of children: 3  . Years of education: N/A   Occupational History  . unemployed    Social History Main Topics  . Smoking status: Former  Smoker    Quit date: 08/04/1998  . Smokeless tobacco: Never Used  . Alcohol use No  . Drug use: No  . Sexual activity: No   Other Topics Concern  . Not on file   Social History Narrative   Marital status: married x 19 years; moderately happy     Children: 3 children ([place], [place], 39 April); 8 grandchildren; 0 gg     Lives: with husband/Steve, April, 2 granddaughters     Employment:  Unemployed; quit working 2015 with work related injury; awaiting disability in 2018; disability approved in 02/2017.       Tobacco: quit smoking in 2016; smoked x 2 years.      Alcohol: none      Drugs: none      Exercise: rarely in 2018         Family History  Problem Relation Age of Onset  .  Heart disease Mother 27       AMI/CAD/CHF as cause of death  . Hypertension Mother   . Hyperlipidemia Mother   . Hypothyroidism Mother   . Depression Mother   . Gout Mother   . Heart disease Father 20       AMI  . Hypertension Father   . Stroke Father 42       mild CVA  . Prostate cancer Father   . Hyperlipidemia Father   . Cancer Father 74       prostate cancer  . Hypertension Brother   . Hyperlipidemia Brother   . Cancer Brother        prostate cancer  . Cancer Maternal Grandmother        type unknown  . Cancer Maternal Grandfather        type unknown  . Heart disease Paternal Grandmother   . Heart defect Paternal Grandfather   . Melanoma Brother   . Hyperlipidemia Brother   . Hypertension Brother   . Cancer Brother        melanoma  . Hypertension Brother   . Hyperlipidemia Brother   . Melanoma Brother   . Cancer Brother 25       melanoma  . Cancer Sister        Basal cell carcinoma scalp  . Depression Sister   . Hypertension Brother   . Depression Brother   . Hypertension Brother   . Gout Brother   . Arthritis Sister   . Depression Sister        Objective:    BP 122/78   Pulse 66   Temp 98 F (36.7 C) (Oral)   Resp 16   Ht 5' 4.96" (1.65 m)   Wt 260 lb (117.9  kg)   SpO2 95%   BMI 43.32 kg/m  Physical Exam  Constitutional: She is oriented to person, place, and time. She appears well-developed and well-nourished. No distress.  HENT:  Head: Normocephalic and atraumatic.  Right Ear: External ear normal.  Left Ear: External ear normal.  Nose: Nose normal.  Mouth/Throat: Oropharynx is clear and moist.  Eyes: Pupils are equal, round, and reactive to light. Conjunctivae and EOM are normal.  Neck: Normal range of motion. Neck supple. Carotid bruit is not present. No thyromegaly present.  Cardiovascular: Normal rate, regular rhythm, normal heart sounds and intact distal pulses.  Exam reveals no gallop and no friction rub.   No murmur heard. Pulmonary/Chest: Effort normal and breath sounds normal. She has no wheezes. She has no rales.  Abdominal: Soft. Bowel sounds are normal. She exhibits no distension and no mass. There is no tenderness. There is no rebound and no guarding.  Lymphadenopathy:    She has no cervical adenopathy.  Neurological: She is alert and oriented to person, place, and time. No cranial nerve deficit. She exhibits normal muscle tone. Coordination normal.  Skin: Skin is warm and dry. No rash noted. She is not diaphoretic. No erythema. No pallor.  Psychiatric: She has a normal mood and affect. Her behavior is normal. Judgment and thought content normal.   No results found. Depression screen [facility] 2/9 05/25/2017 05/13/2017 04/21/2017 01/09/2017 10/14/2016  Decreased Interest 1 0 1 3 0  Down, Depressed, Hopeless 1 0 1 3 0  PHQ - 2 Score 2 0 2 6 0  Altered sleeping 1 - 1 3 -  Tired, decreased energy 1 - 1 3 -  Change in appetite 0 - 0 2 -  Feeling bad or failure about yourself  0 - 1 3 -  Trouble concentrating 0 - 0 3 -  Moving slowly or fidgety/restless 0 - 0 3 -  Suicidal thoughts 0 - 0 0 -  PHQ-9 Score 4 - 5 23 -  Difficult doing work/chores - - - Somewhat difficult -   Fall Risk  05/25/2017 05/13/2017 04/21/2017 02/17/2017 01/09/2017    Falls in the past year? No No No Yes No  Number falls in past yr: - - - - -  Injury with [place]:   1. Tachycardia   2. Adjustment disorder with mixed anxiety and depressed mood   3. Essential hypertension   4. Chronic pain syndrome   5. Fibromyalgia   6. Migraine without aura and without status migrainosus, not intractable   7. Gastroesophageal reflux disease without esophagitis   8. Pure hypercholesterolemia    -tachycardia has improved with decreased dose of Cymbalta yet anxiety/depression improving.  Rx for Sertraline 50mg  daily.  Continue Cymbalta 60mg  daily. -major family stressor ongoing contributing to current emotional instability.  -has upcoming appointment with cardiology due to recent tachycardia due to comorbidities and further cardiac risk assessment.    No orders of the defined types were placed in this encounter.  Meds ordered this encounter  Medications  . sertraline (ZOLOFT) 50 MG tablet    Sig: Take 1 tablet (50 mg total) by mouth daily.    Dispense:  90 tablet    Refill:  1    Return in about 2 months (around 07/25/2017) for recheck anxiety/depression, fibromyalgia.   Lashawn Orrego Elayne Guerin, M.D. Primary Care at [facility] previously Urgent [place] [address] [place], [place]  [address] [phone] phone [phone] fax

## 2017-05-25 NOTE — Patient Instructions (Addendum)
   START TAKING SERTRALINE/ZOLOFT 50MG  ONE AT BEDTIME. CONTINUE CYMBALTA 60MG  ONE CAPSULE DAILY.  IF you received an x-ray today, you will receive an invoice from [facility]. Please contact [facility] at [phone] with questions or concerns regarding your invoice.   IF you received labwork today, you will receive an invoice from [facility]. Please contact [facility] at [phone] with questions or concerns regarding your invoice.   Our billing staff will not be able to assist you with questions regarding bills from these companies.  You will be contacted with the lab results as soon as they are available. The fastest way to get your results is to activate your My Chart account. Instructions are located on the last page of this paperwork. If you have not heard from [place] regarding the results in 2 weeks, please contact this office.

## 2017-05-29 DIAGNOSIS — H524 Presbyopia: Secondary | ICD-10-CM | POA: Diagnosis not present

## 2017-06-04 HISTORY — PX: OTHER SURGICAL HISTORY: SHX169

## 2017-06-04 HISTORY — PX: TRANSTHORACIC ECHOCARDIOGRAM: SHX275

## 2017-06-11 ENCOUNTER — Encounter: Payer: Self-pay | Admitting: Cardiology

## 2017-06-11 ENCOUNTER — Ambulatory Visit: Payer: 59 | Admitting: Cardiology

## 2017-06-11 VITALS — BP 130/90 | HR 75 | Ht 64.0 in | Wt 264.0 lb

## 2017-06-11 DIAGNOSIS — E7800 Pure hypercholesterolemia, unspecified: Secondary | ICD-10-CM | POA: Diagnosis not present

## 2017-06-11 DIAGNOSIS — Z6841 Body Mass Index (BMI) 40.0 and over, adult: Secondary | ICD-10-CM | POA: Diagnosis not present

## 2017-06-11 DIAGNOSIS — I10 Essential (primary) hypertension: Secondary | ICD-10-CM | POA: Diagnosis not present

## 2017-06-11 DIAGNOSIS — R0602 Shortness of breath: Secondary | ICD-10-CM | POA: Insufficient documentation

## 2017-06-11 DIAGNOSIS — R000 Tachycardia, unspecified: Secondary | ICD-10-CM | POA: Diagnosis not present

## 2017-06-11 DIAGNOSIS — R0789 Other chest pain: Secondary | ICD-10-CM

## 2017-06-11 DIAGNOSIS — R002 Palpitations: Secondary | ICD-10-CM | POA: Insufficient documentation

## 2017-06-11 MED ORDER — PROPRANOLOL HCL 60 MG PO TABS: 60.0000 mg | ORAL_TABLET | Freq: Three times a day (TID) | ORAL | 3 refills | Status: DC

## 2017-06-11 NOTE — Progress Notes (Signed)
PCP: Wardell Honour, MD  Clinic Note: Chief Complaint  Patient presents with  . New Patient (Initial Visit)    fatigue, chest pain, palpitations    HPI: [name] is a 61 y.o. female who is being seen today for the evaluation of TACHYCARDIA, FATIGUE / LIGHTHEADEDNESS at the request of Wardell Honour, MD.  [name] was last seen on Oct 22 by Dr. Tamala Julian for f/u after initial visit on 10/10 for shakiness/fatigue & dizziness with near syncope.  Got dizzy in the shower -> nauseated & near syncope when she got out.  HR range from 100-130 bpm.   HR feels like a "FLUTTER".  Of note - was recovering from a URI. Also had recently stopped SSRI - was feeling jittery & nervous (concerns with "irresponsible daughter", pt is caring for & financially supporting granddaughters). - Echo & ST > 2 yrs ago Isaias Cowman, [place] [facility]).  Recent Hospitalizations: n/a  Studies Personally Reviewed - (if available, images/films reviewed: From Epic Chart or Care Everywhere)  Echo 03/2008: Normal LV size & function. EF ~60%. No RWMA. ~ dilated LA. Mildly increased PAP. No significant valve disease.   Interval History: [name] presents today for evaluation of multiple different symptoms.  Very difficult to really tease out all the symptoms.  She says that she has had several different symptoms including worsening shortness of breath today.  She has gained about 4 pounds a day and feels a little bit of swelling in her hands and feet. She has had some right-sided chest pain that is worse with certain movements and deep inspiration. She has noted that her blood pressure and heart rate have been fluctuating up and down lately.  She says her heart rate has gone up over the 100 bpm several times but her blood pressure has been lower than it was a day.  She really denies any syncope or near syncope type symptoms but does note dizziness with her palpitations.  She seems overall quite frazzled and stressed.  She  has sharp pains along the right chest wall, but no chest pain on exertion.  She does have exertional dyspnea but no resting dyspnea.  No PND or orthopnea, but she does sleep with a CPAP.  If she does not wear her CPAP, she would definitely get short of breath lying down. No syncope/near syncope. No TIA/amaurosis fugax symptoms. No melena, hematochezia, hematuria, or epstaxis. No claudication.  ROS: A comprehensive was performed. Review of Systems  Constitutional: Positive for malaise/fatigue. Negative for weight loss (Actually notes a forward pound weight gain today).  Respiratory: Positive for cough (She does note a dry cough of late been going on for the last month) and shortness of breath (Mostly with exertion and lying down).   Cardiovascular: Positive for palpitations and leg swelling (Puffy swelling in the hands and feet).  Gastrointestinal: Negative for abdominal pain, blood in stool, constipation and melena.  Genitourinary: Negative for hematuria.  Musculoskeletal: Positive for back pain and joint pain.  Neurological: Positive for dizziness and tremors. Negative for focal weakness.       Lightheadedness  Endo/Heme/Allergies: Negative for environmental allergies.  Psychiatric/Behavioral: Negative for depression and memory loss. The patient is nervous/anxious.   All other systems reviewed and are negative.  I have reviewed and (if needed) personally updated the patient's problem list, medications, allergies, past medical and surgical history, social and family history.   Past Medical History:  Diagnosis Date  . Allergy    [name],  Flonase  . Anemia   . Anxiety   . Arthritis   . Chronic kidney disease   . Colon polyps   . Depression   . Fibromyalgia   . Gastritis   . GERD (gastroesophageal reflux disease)   . Hernia, hiatal   . HLD (hyperlipidemia)   . Hypertension   . Migraine   . Neuropathy   . Osteoporosis   . Pneumonia   . Sleep apnea with use of continuous positive  airway pressure (CPAP)   . Thyroid goiter     Past Surgical History:  Procedure Laterality Date  . ABDOMINAL HYSTERECTOMY     ovaries intact; DUB.  No dysplasia.  Marland Kitchen PARTIAL HYSTERECTOMY    . ROTATOR CUFF REPAIR Right 2009  . SHOULDER SURGERY Right    shoulder dislocation, fall, and elbow unla nerve  . SHOULDER SURGERY Right 2015   labral tear  . TUBAL LIGATION    . ULNAR NERVE REPAIR Right 08/19/2014    Current Meds  Medication Sig  . atorvastatin (LIPITOR) 10 MG tablet TAKE 1 TABLET (10 MG TOTAL) BY MOUTH DAILY AT 6 PM.  . butalbital-acetaminophen-caffeine (FIORICET, ESGIC) 50-325-40 MG tablet Take 1-2 tablets by mouth every 6 (six) hours as needed for headache.  . cyclobenzaprine (FLEXERIL) 10 MG tablet Take 0.5-1 tablets (5-10 mg total) by mouth 3 (three) times daily as needed for muscle spasms.  Marland Kitchen darifenacin (ENABLEX) 15 MG 24 hr tablet TAKE 1 TABLET BY MOUTH ONCE DAILY  . dexlansoprazole (DEXILANT) 60 MG capsule Take 1 capsule (60 mg total) by mouth daily.  . DULoxetine (CYMBALTA) 60 MG capsule Take 2 capsules (120 mg total) by mouth daily.  Marland Kitchen gabapentin (NEURONTIN) 300 MG capsule Take 1-2 capsules (300-600 mg total) by mouth 4 (four) times daily.  Marland Kitchen lisinopril (PRINIVIL,ZESTRIL) 20 MG tablet Take 1 tablet (20 mg total) by mouth daily.  . ondansetron (ZOFRAN-ODT) 4 MG disintegrating tablet DISSOLVE 1 TABLET BY MOUTH EVERY 8 HOURS AS NEEDED FOR NAUSEA OR VOMITING.  Marland Kitchen oxyCODONE-acetaminophen (PERCOCET/ROXICET) 5-325 MG tablet Take 1 tablet by mouth every 12 (twelve) hours as needed for moderate pain or severe pain.  Marland Kitchen propranolol (INDERAL) 60 MG tablet Take 1 tablet (60 mg total) 3 (three) times daily by mouth.  . ranitidine (ZANTAC) 150 MG tablet Take 1 tablet (150 mg total) by mouth at bedtime.  . sertraline (ZOLOFT) 50 MG tablet Take 1 tablet (50 mg total) by mouth daily.  Marland Kitchen topiramate (TOPAMAX) 50 MG tablet TAKE 1-2 TABLETS BY MOUTH NIGHTLY FOR HEADACHE PREVENTION  .  [DISCONTINUED] propranolol (INDERAL) 60 MG tablet Take 1 tablet (60 mg total) by mouth 2 (two) times daily.    Allergies  Allergen Reactions  . Sulfa Antibiotics     Social History   Socioeconomic History  . Marital status: Married    Spouse name: None  . Number of children: 3  . Years of education: None  . Highest education level: None  Social Needs  . Financial resource strain: None  . Food insecurity - worry: None  . Food insecurity - inability: None  . Transportation needs - medical: None  . Transportation needs - non-medical: None  Occupational History  . Occupation: unemployed  Tobacco Use  . Smoking status: Former Smoker    Last attempt to quit: 08/04/1998    Years since quitting: 18.8  . Smokeless tobacco: Never Used  Substance and Sexual Activity  . Alcohol use: No  . Drug use: No  . Sexual activity:  No    Birth control/protection: Surgical  Other Topics Concern  . None  Social History Narrative   Marital status: married x 19 years; moderately happy     Children: 3 children ([place], [place], 39 April); 8 grandchildren; 0 gg     Lives: with husband/Steve, April, 2 granddaughters     Employment:  Unemployed; quit working 2015 with work related injury; awaiting disability in 2018; disability approved in 02/2017.       Tobacco: quit smoking in 2016; smoked x 2 years.      Alcohol: none      Drugs: none      Exercise: rarely in 2018         Family History  Problem Relation Age of Onset  . Heart disease Mother 34       AMI/CAD/CHF as cause of death  . Hypertension Mother   . Hyperlipidemia Mother   . Hypothyroidism Mother   . Depression Mother   . Gout Mother   . Heart disease Father 50       AMI  . Hypertension Father   . Stroke Father 48       mild CVA  . Prostate cancer Father   . Hyperlipidemia Father   . Cancer Father 23       prostate cancer  . Hypertension Brother   . Hyperlipidemia Brother   . Cancer Brother        prostate cancer  .  Cancer Maternal Grandmother        type unknown  . Cancer Maternal Grandfather        type unknown  . Heart disease Paternal Grandmother   . Heart defect Paternal Grandfather   . Melanoma Brother   . Hyperlipidemia Brother   . Hypertension Brother   . Cancer Brother        melanoma  . Hypertension Brother   . Hyperlipidemia Brother   . Melanoma Brother   . Cancer Brother 25       melanoma  . Cancer Sister        Basal cell carcinoma scalp  . Depression Sister   . Hypertension Brother   . Depression Brother   . Hypertension Brother   . Gout Brother   . Arthritis Sister   . Depression Sister       Wt Readings from Last 3 Encounters:  06/11/17 264 lb (119.7 kg)  05/25/17 260 lb (117.9 kg)  05/13/17 257 lb (116.6 kg)    PHYSICAL EXAM BP 130/90   Pulse 75   Ht 5\' 4"  (1.626 m)   Wt 264 lb (119.7 kg)   SpO2 97%   BMI 45.32 kg/m  Physical Exam  Constitutional: She is oriented to person, place, and time. She appears well-developed. She appears distressed (Not acutely distressed, but seems to be chronically ill.).  Morbidly obese  HENT:  Head: Normocephalic and atraumatic.  Mouth/Throat: Oropharynx is clear and moist. No oropharyngeal exudate.  Eyes: EOM are normal. Pupils are equal, round, and reactive to light.  Neck: No JVD (Very difficult to assess) present. Carotid bruit is not present.  Cardiovascular: Normal rate, regular rhythm and intact distal pulses.  Occasional extrasystoles are present. Exam reveals distant heart sounds. Exam reveals no gallop and no friction rub.  No murmur heard. Pulmonary/Chest: Breath sounds normal. No respiratory distress. She has no wheezes. She has no rales.  Mild diffuse interstitial sounds. Point tenderness along the right sternal border.  Abdominal: Soft. Bowel sounds  are normal. She exhibits no distension. There is no tenderness. There is no rebound.  Obese  Musculoskeletal: Normal range of motion. She exhibits edema (Trace to  1+ bilateral lower extremity edema).  Neurological: She is alert and oriented to person, place, and time. No cranial nerve deficit.  Skin: Skin is warm and dry. No rash noted. No erythema.  Psychiatric: Judgment normal.  Seems quite anxious.  Quite positive review of symptoms  Nursing note and vitals reviewed.    Adult ECG Report -Not checked  Other studies Reviewed: Additional studies/ records that were reviewed today include:  Recent Labs:   Lab Results  Component Value Date   CREATININE 0.87 05/13/2017   BUN 11 05/13/2017   NA 140 05/13/2017   K 5.0 05/13/2017   CL 106 05/13/2017   CO2 21 05/13/2017   Lab Results  Component Value Date   CHOL 172 01/09/2017   HDL 59 01/09/2017   LDLCALC 93 01/09/2017   TRIG 102 01/09/2017   CHOLHDL 2.9 01/09/2017    ASSESSMENT / PLAN: Problem List Items Addressed This Visit    Chest wall pain    The pain that she is noticing right now is pretty much reproducible on exam I am able to press on several places along her chest and reproduce her symptoms.  Much less likely to be related to cardiac etiology.      Class 3 severe obesity due to excess calories with serious comorbidity and body mass index (BMI) of 40.0 to 44.9 in adult [facility]) (Chronic)    Clearly that this is part of the reason for her shortness of breath.  Suspect some component of obesity hypoventilation syndrome.  We are taken to the echocardiogram to assess pulmonary pressures.  She is on CPAP already for OSA.  We discussed the importance of weight loss.      Relevant Orders   ECHOCARDIOGRAM COMPLETE   Essential hypertension (Chronic)    Blood pressures upper limit of normal today on lisinopril (which we may need to reconsider given her dry cough) along with propranolol.      Relevant Medications   propranolol (INDERAL) 60 MG tablet   Pure hypercholesterolemia (Chronic)    Lipid panel looks pretty good at present.  LDL 93 which given her current risk factors is pretty  much at goal.  He she is on low-dose atorvastatin which she seems to be tolerating okay.      Relevant Medications   propranolol (INDERAL) 60 MG tablet   Shortness of breath on exertion    Exertional dyspnea is probably related to her deconditioning and obesity, but she seems more short of breath today.  She does have CPAP and could very well have some obesity hypoventilation syndrome.  We will check a 2D echocardiogram to assess LV function both systolic and diastolic as well as right-sided filling pressures.      Relevant Orders   ECHOCARDIOGRAM COMPLETE   HOLTER MONITOR - 48 HOUR   Tachycardia - Primary    Multiple increasing spells of rapid heart rate that is been going on for about the last month now.  At least a couple times a day. Plan: 40-hour monitor.  2D echocardiogram to assess for structural abnormality in light of her dyspnea  -We will also have her increase her propranolol to 60 mg 3 times daily      Relevant Orders   ECHOCARDIOGRAM COMPLETE   HOLTER MONITOR - 48 HOUR      Current medicines are  reviewed at length with the patient today. (+/- concerns) n/a The following changes have been made: n/a  Patient Instructions  MEDICATION  PLEASE WEAR HOLTER MONITOR FOR 48 HOURS AFTER YOU RETURN MONITOR   INCREASE PROPANOLOL TO 60 MG THREE TIMES A DAY.    SCHEDULE AT [place] has requested that you have an echocardiogram. Echocardiography is a painless test that uses sound waves to create images of your heart. It provides your doctor with information about the size and shape of your heart and how well your heart's chambers and valves are working. This procedure takes approximately one hour. There are no restrictions for this procedure.   AND  Your physician has recommended that you wear a holter monitor - 48 HOURS. Holter monitors are medical devices that record the heart's electrical activity. Doctors most often use these  monitors to diagnose arrhythmias. Arrhythmias are problems with the speed or rhythm of the heartbeat. The monitor is a small, portable device. You can wear one while you do your normal daily activities. This is usually used to diagnose what is causing palpitations/syncope (passing out). WHILE  THE MONITOR IS OFF DURING A SHOWER- IF HEART RATE INCREASE WHILE IN SHOWER TRY AN CAPTURE HEART RATE AND PLACE MONITOR ON AS SOON AS YOU CAN TO CAPTURE     Your physician recommends that you schedule a follow-up appointment in [place]    If you need a refill on your cardiac medications before your next appointment, please call your pharmacy.    Studies Ordered:   Orders Placed This Encounter  Procedures  . HOLTER MONITOR - 48 HOUR  . ECHOCARDIOGRAM COMPLETE      Glenetta Hew, M.D., M.S. Interventional Cardiologist   Pager # [phone] Phone # [phone] [address] [place], [place] [address]

## 2017-06-11 NOTE — Patient Instructions (Signed)
MEDICATION  PLEASE WEAR HOLTER MONITOR FOR 48 HOURS AFTER YOU RETURN MONITOR   INCREASE PROPANOLOL TO 60 MG THREE TIMES A DAY.    SCHEDULE AT [place] has requested that you have an echocardiogram. Echocardiography is a painless test that uses sound waves to create images of your heart. It provides your doctor with information about the size and shape of your heart and how well your heart's chambers and valves are working. This procedure takes approximately one hour. There are no restrictions for this procedure.   AND  Your physician has recommended that you wear a holter monitor - 48 HOURS. Holter monitors are medical devices that record the heart's electrical activity. Doctors most often use these monitors to diagnose arrhythmias. Arrhythmias are problems with the speed or rhythm of the heartbeat. The monitor is a small, portable device. You can wear one while you do your normal daily activities. This is usually used to diagnose what is causing palpitations/syncope (passing out). WHILE  THE MONITOR IS OFF DURING A SHOWER- IF HEART RATE INCREASE WHILE IN SHOWER TRY AN CAPTURE HEART RATE AND PLACE MONITOR ON AS SOON AS YOU CAN TO CAPTURE     Your physician recommends that you schedule a follow-up appointment in [place]    If you need a refill on your cardiac medications before your next appointment, please call your pharmacy.

## 2017-06-13 ENCOUNTER — Encounter: Payer: Self-pay | Admitting: Cardiology

## 2017-06-13 DIAGNOSIS — R0789 Other chest pain: Secondary | ICD-10-CM | POA: Insufficient documentation

## 2017-06-13 NOTE — Assessment & Plan Note (Signed)
The pain that she is noticing right now is pretty much reproducible on exam I am able to press on several places along her chest and reproduce her symptoms.  Much less likely to be related to cardiac etiology.

## 2017-06-13 NOTE — Assessment & Plan Note (Signed)
Exertional dyspnea is probably related to her deconditioning and obesity, but she seems more short of breath today.  She does have CPAP and could very well have some obesity hypoventilation syndrome.  We will check a 2D echocardiogram to assess LV function both systolic and diastolic as well as right-sided filling pressures.

## 2017-06-13 NOTE — Assessment & Plan Note (Signed)
Lipid panel looks pretty good at present.  LDL 93 which given her current risk factors is pretty much at goal.  He she is on low-dose atorvastatin which she seems to be tolerating okay.

## 2017-06-13 NOTE — Assessment & Plan Note (Addendum)
Multiple increasing spells of rapid heart rate that is been going on for about the last month now.  At least a couple times a day. Plan: 40-hour monitor.  2D echocardiogram to assess for structural abnormality in light of her dyspnea  -We will also have her increase her propranolol to 60 mg 3 times daily

## 2017-06-13 NOTE — Assessment & Plan Note (Signed)
Blood pressures upper limit of normal today on lisinopril (which we may need to reconsider given her dry cough) along with propranolol.

## 2017-06-13 NOTE — Assessment & Plan Note (Signed)
Clearly that this is part of the reason for her shortness of breath.  Suspect some component of obesity hypoventilation syndrome.  We are taken to the echocardiogram to assess pulmonary pressures.  She is on CPAP already for OSA.  We discussed the importance of weight loss.

## 2017-06-18 MED FILL — ONDANSETRON ODT 4 MG TABLET: 4 | 7 days supply | Qty: 20 | Fill #3

## 2017-06-24 ENCOUNTER — Ambulatory Visit (INDEPENDENT_AMBULATORY_CARE_PROVIDER_SITE_OTHER): Payer: 59

## 2017-06-24 ENCOUNTER — Ambulatory Visit (HOSPITAL_COMMUNITY): Payer: 59 | Attending: Internal Medicine

## 2017-06-24 ENCOUNTER — Other Ambulatory Visit: Payer: Self-pay

## 2017-06-24 DIAGNOSIS — R000 Tachycardia, unspecified: Secondary | ICD-10-CM

## 2017-06-24 DIAGNOSIS — Z6841 Body Mass Index (BMI) 40.0 and over, adult: Secondary | ICD-10-CM | POA: Diagnosis not present

## 2017-06-24 DIAGNOSIS — I5030 Unspecified diastolic (congestive) heart failure: Secondary | ICD-10-CM | POA: Insufficient documentation

## 2017-06-24 DIAGNOSIS — R0602 Shortness of breath: Secondary | ICD-10-CM

## 2017-07-06 ENCOUNTER — Emergency Department (HOSPITAL_BASED_OUTPATIENT_CLINIC_OR_DEPARTMENT_OTHER): Payer: 59

## 2017-07-06 ENCOUNTER — Emergency Department (HOSPITAL_BASED_OUTPATIENT_CLINIC_OR_DEPARTMENT_OTHER)
Admission: EM | Admit: 2017-07-06 | Discharge: 2017-07-06 | Disposition: A | Discharge status: Home / Self Care | Payer: 59 | Attending: Emergency Medicine | Admitting: Emergency Medicine

## 2017-07-06 ENCOUNTER — Encounter (HOSPITAL_BASED_OUTPATIENT_CLINIC_OR_DEPARTMENT_OTHER): Payer: Self-pay | Admitting: *Deleted

## 2017-07-06 ENCOUNTER — Other Ambulatory Visit: Payer: Self-pay

## 2017-07-06 DIAGNOSIS — M25551 Pain in right hip: Secondary | ICD-10-CM | POA: Insufficient documentation

## 2017-07-06 DIAGNOSIS — Y92019 Unspecified place in single-family (private) house as the place of occurrence of the external cause: Secondary | ICD-10-CM | POA: Diagnosis not present

## 2017-07-06 DIAGNOSIS — M7989 Other specified soft tissue disorders: Secondary | ICD-10-CM | POA: Diagnosis not present

## 2017-07-06 DIAGNOSIS — M25561 Pain in right knee: Secondary | ICD-10-CM | POA: Diagnosis not present

## 2017-07-06 DIAGNOSIS — R0781 Pleurodynia: Secondary | ICD-10-CM | POA: Insufficient documentation

## 2017-07-06 DIAGNOSIS — Z87891 Personal history of nicotine dependence: Secondary | ICD-10-CM | POA: Insufficient documentation

## 2017-07-06 DIAGNOSIS — Y9301 Activity, walking, marching and hiking: Secondary | ICD-10-CM | POA: Diagnosis not present

## 2017-07-06 DIAGNOSIS — Z79899 Other long term (current) drug therapy: Secondary | ICD-10-CM | POA: Diagnosis not present

## 2017-07-06 DIAGNOSIS — S299XXA Unspecified injury of thorax, initial encounter: Secondary | ICD-10-CM | POA: Diagnosis not present

## 2017-07-06 DIAGNOSIS — N189 Chronic kidney disease, unspecified: Secondary | ICD-10-CM | POA: Insufficient documentation

## 2017-07-06 DIAGNOSIS — R42 Dizziness and giddiness: Secondary | ICD-10-CM | POA: Insufficient documentation

## 2017-07-06 DIAGNOSIS — S6991XA Unspecified injury of right wrist, hand and finger(s), initial encounter: Secondary | ICD-10-CM | POA: Diagnosis not present

## 2017-07-06 DIAGNOSIS — R079 Chest pain, unspecified: Secondary | ICD-10-CM | POA: Diagnosis not present

## 2017-07-06 DIAGNOSIS — I129 Hypertensive chronic kidney disease with stage 1 through stage 4 chronic kidney disease, or unspecified chronic kidney disease: Secondary | ICD-10-CM | POA: Diagnosis not present

## 2017-07-06 DIAGNOSIS — S52501A Unspecified fracture of the lower end of right radius, initial encounter for closed fracture: Secondary | ICD-10-CM | POA: Diagnosis not present

## 2017-07-06 DIAGNOSIS — M25531 Pain in right wrist: Secondary | ICD-10-CM | POA: Diagnosis not present

## 2017-07-06 DIAGNOSIS — W19XXXA Unspecified fall, initial encounter: Secondary | ICD-10-CM | POA: Diagnosis not present

## 2017-07-06 DIAGNOSIS — Y998 Other external cause status: Secondary | ICD-10-CM | POA: Insufficient documentation

## 2017-07-06 DIAGNOSIS — S52571A Other intraarticular fracture of lower end of right radius, initial encounter for closed fracture: Secondary | ICD-10-CM | POA: Diagnosis not present

## 2017-07-06 DIAGNOSIS — S8991XA Unspecified injury of right lower leg, initial encounter: Secondary | ICD-10-CM | POA: Diagnosis not present

## 2017-07-06 DIAGNOSIS — S52591A Other fractures of lower end of right radius, initial encounter for closed fracture: Secondary | ICD-10-CM | POA: Diagnosis not present

## 2017-07-06 DIAGNOSIS — S79911A Unspecified injury of right hip, initial encounter: Secondary | ICD-10-CM | POA: Diagnosis not present

## 2017-07-06 LAB — CBC WITH DIFFERENTIAL/PLATELET
BASOS ABS: 0 10*3/uL (ref 0.0–0.1)
BASOS PCT: 0 %
EOS ABS: 0.1 10*3/uL (ref 0.0–0.7)
Eosinophils Relative: 2 %
HCT: 34.4 % — ABNORMAL LOW (ref 36.0–46.0)
Hemoglobin: 10.9 g/dL — ABNORMAL LOW (ref 12.0–15.0)
Lymphocytes Relative: 27 %
Lymphs Abs: 1.8 10*3/uL (ref 0.7–4.0)
MCH: 27.5 pg (ref 26.0–34.0)
MCHC: 31.7 g/dL (ref 30.0–36.0)
MCV: 86.6 fL (ref 78.0–100.0)
MONO ABS: 0.5 10*3/uL (ref 0.1–1.0)
MONOS PCT: 8 %
Neutro Abs: 4.2 10*3/uL (ref 1.7–7.7)
Neutrophils Relative %: 63 %
PLATELETS: 229 10*3/uL (ref 150–400)
RBC: 3.97 MIL/uL (ref 3.87–5.11)
RDW: 14.1 % (ref 11.5–15.5)
WBC: 6.6 10*3/uL (ref 4.0–10.5)

## 2017-07-06 LAB — BASIC METABOLIC PANEL
ANION GAP: 6 (ref 5–15)
BUN: 13 mg/dL (ref 6–20)
CALCIUM: 8.8 mg/dL — AB (ref 8.9–10.3)
CO2: 22 mmol/L (ref 22–32)
CREATININE: 0.92 mg/dL (ref 0.44–1.00)
Chloride: 109 mmol/L (ref 101–111)
Glucose, Bld: 103 mg/dL — ABNORMAL HIGH (ref 65–99)
Potassium: 4.2 mmol/L (ref 3.5–5.1)
SODIUM: 137 mmol/L (ref 135–145)

## 2017-07-06 MED ORDER — HYDROMORPHONE HCL 1 MG/ML IJ SOLN
1.0000 mg | Freq: Once | INTRAMUSCULAR | Status: AC
Start: 2017-07-06 — End: 2017-07-06
  Administered 2017-07-06: 1 mg via INTRAVENOUS

## 2017-07-06 MED ORDER — MORPHINE SULFATE (PF) 4 MG/ML IV SOLN
4.0000 mg | Freq: Once | INTRAVENOUS | Status: AC
  Administered 2017-07-06: 4 mg via INTRAVENOUS
  Filled 2017-07-06: qty 1

## 2017-07-06 MED ORDER — OXYCODONE-ACETAMINOPHEN 5-325 MG PO TABS: 1.0000 | ORAL_TABLET | Freq: Four times a day (QID) | ORAL | 0 refills | Status: DC | PRN

## 2017-07-06 MED ORDER — HYDROMORPHONE HCL 1 MG/ML IJ SOLN
INTRAMUSCULAR | Status: AC
  Administered 2017-07-06: 1 mg via INTRAVENOUS
  Filled 2017-07-06: qty 1

## 2017-07-06 MED FILL — OXYCODONE-ACETAMINOPHEN 5-3: 5-325 | 3 days supply | Qty: 15 | Fill #0

## 2017-07-06 MED FILL — GABAPENTIN 300 MG CAPSULE: 300 | 68 days supply | Qty: 540 | Fill #1

## 2017-07-06 NOTE — ED Notes (Signed)
Pt discharged to home with family. NAD.  

## 2017-07-06 NOTE — Discharge Instructions (Signed)
Please drive to Dr. Angus Palms office once you are discharged from the emergency department.  This will need surgery, and his PA will arrange close follow-up for you.  Return for worsening symptoms, including escalating pain, numbness/weakness of the fingers, or any other symptoms concerning to you.

## 2017-07-06 NOTE — ED Notes (Signed)
Patient transported to X-ray 

## 2017-07-06 NOTE — ED Provider Notes (Signed)
[facility] DEPART[facility] Provider Note   CSN: [id number] Arrival date & time: 07/06/17  1153     History   Chief Complaint Chief Complaint  Patient presents with  . Fall    HPI [name] is a 61 y.o. female.  HPI 61 year old female who presents with falls.  She has a history of chronic kidney disease, fibromyalgia, hypertension hyperlipidemia.  She does not take any anticoagulants.  Reports that she has been in her usual state of health.  Today while walking down the hallway became dizzy, as if the room was spinning.  She states she lost her balance and fell forward onto the right side of her body and face.  She did not have any loss of consciousness, vomiting, focal numbness or weakness.  She was able to get up and ambulate by herself after the fall.  Currently denies any dizziness, syncope or near syncope.  No chest pain or difficulty breathing.  Primarily complains of pain and deformity to the right wrist, right-sided rib tenderness, right hip and knee pain. Past Medical History:  Diagnosis Date  . Allergy    [name], Flonase  . Anemia   . Anxiety   . Arthritis   . Chronic kidney disease   . Colon polyps   . Depression   . Fibromyalgia   . Gastritis   . GERD (gastroesophageal reflux disease)   . Hernia, hiatal   . HLD (hyperlipidemia)   . Hypertension   . Migraine   . Neuropathy   . Osteoporosis   . Pneumonia   . Sleep apnea with use of continuous positive airway pressure (CPAP)   . Thyroid goiter     Patient Active Problem List   Diagnosis Date Noted  . Chest wall pain 06/13/2017  . Tachycardia 06/11/2017  . Shortness of breath on exertion 06/11/2017  . Migraine without aura and without status migrainosus, not intractable 03/02/2017  . Class 3 severe obesity due to excess calories with serious comorbidity and body mass index (BMI) of 40.0 to 44.9 in adult ([place]) 01/13/2017  . Hiatal hernia 10/28/2016  . Pure hypercholesterolemia 10/28/2016   . Gastritis and gastroduodenitis 10/28/2016  . Chronic kidney disease 06/01/2015  . Esophageal reflux 06/01/2015  . Goiter 06/01/2015  . Essential hypertension 06/01/2015  . Hx of migraine headaches 06/01/2015  . Chronic pain syndrome 05/28/2015  . Fibromyalgia 05/28/2015  . OSA (obstructive sleep apnea) 05/28/2015  . Adjustment disorder with mixed anxiety and depressed mood 05/28/2015    Past Surgical History:  Procedure Laterality Date  . ABDOMINAL HYSTERECTOMY     ovaries intact; DUB.  No dysplasia.  Marland Kitchen PARTIAL HYSTERECTOMY    . ROTATOR CUFF REPAIR Right 2009  . SHOULDER SURGERY Right    shoulder dislocation, fall, and elbow unla nerve  . SHOULDER SURGERY Right 2015   labral tear  . TUBAL LIGATION    . ULNAR NERVE REPAIR Right 08/19/2014    OB History    No data available       Home Medications    Prior to Admission medications   Medication Sig Start Date End Date Taking? Authorizing Provider  atorvastatin (LIPITOR) 10 MG tablet TAKE 1 TABLET (10 MG TOTAL) BY MOUTH DAILY AT 6 PM. 02/12/17   Wardell Honour, MD  butalbital-acetaminophen-caffeine (FIORICET, ESGIC) [phone] MG tablet Take 1-2 tablets by mouth every 6 (six) hours as needed for headache. 04/21/17 04/21/18  Wardell Honour, MD  cyclobenzaprine (FLEXERIL) 10 MG tablet Take 0.5-1 tablets (  5-10 mg total) by mouth 3 (three) times daily as needed for muscle spasms. 04/21/17   Wardell Honour, MD  darifenacin (ENABLEX) 15 MG 24 hr tablet TAKE 1 TABLET BY MOUTH ONCE DAILY 02/12/17   Wardell Honour, MD  dexlansoprazole (DEXILANT) 60 MG capsule Take 1 capsule (60 mg total) by mouth daily. 02/09/17   Wardell Honour, MD  DULoxetine (CYMBALTA) 60 MG capsule Take 2 capsules (120 mg total) by mouth daily. 02/17/17   Wardell Honour, MD  gabapentin (NEURONTIN) 300 MG capsule Take 1-2 capsules (300-600 mg total) by mouth 4 (four) times daily. 02/17/17   Wardell Honour, MD  lisinopril (PRINIVIL,ZESTRIL) 20 MG tablet Take 1 tablet  (20 mg total) by mouth daily. 02/09/17   Wardell Honour, MD  ondansetron (ZOFRAN-ODT) 4 MG disintegrating tablet DISSOLVE 1 TABLET BY MOUTH EVERY 8 HOURS AS NEEDED FOR NAUSEA OR VOMITING. 03/13/17   Wardell Honour, MD  oxyCODONE-acetaminophen (PERCOCET/ROXICET) 5-325 MG tablet Take 1 tablet by mouth every 12 (twelve) hours as needed for moderate pain or severe pain. 04/21/17   Wardell Honour, MD  oxyCODONE-acetaminophen (PERCOCET/ROXICET) 5-325 MG tablet Take 1-2 tablets by mouth every 6 (six) hours as needed for moderate pain or severe pain. 07/06/17   Forde Dandy, MD  propranolol (INDERAL) 60 MG tablet Take 1 tablet (60 mg total) 3 (three) times daily by mouth. 06/11/17   Leonie Man, MD  ranitidine (ZANTAC) 150 MG tablet Take 1 tablet (150 mg total) by mouth at bedtime. 04/21/17   Wardell Honour, MD  sertraline (ZOLOFT) 50 MG tablet Take 1 tablet (50 mg total) by mouth daily. 05/25/17   Wardell Honour, MD  topiramate (TOPAMAX) 50 MG tablet TAKE 1-2 TABLETS BY MOUTH NIGHTLY FOR HEADACHE PREVENTION 05/23/16   Wardell Honour, MD    Family History Family History  Problem Relation Age of Onset  . Heart disease Mother 27       AMI/CAD/CHF as cause of death  . Hypertension Mother   . Hyperlipidemia Mother   . Hypothyroidism Mother   . Depression Mother   . Gout Mother   . Heart disease Father 38       AMI  . Hypertension Father   . Stroke Father 44       mild CVA  . Prostate cancer Father   . Hyperlipidemia Father   . Cancer Father 11       prostate cancer  . Hypertension Brother   . Hyperlipidemia Brother   . Cancer Brother        prostate cancer  . Cancer Maternal Grandmother        type unknown  . Cancer Maternal Grandfather        type unknown  . Heart disease Paternal Grandmother   . Heart defect Paternal Grandfather   . Melanoma Brother   . Hyperlipidemia Brother   . Hypertension Brother   . Cancer Brother        melanoma  . Hypertension Brother   . Hyperlipidemia  Brother   . Melanoma Brother   . Cancer Brother 25       melanoma  . Cancer Sister        Basal cell carcinoma scalp  . Depression Sister   . Hypertension Brother   . Depression Brother   . Hypertension Brother   . Gout Brother   . Arthritis Sister   . Depression Sister     Social History  Social History   Tobacco Use  . Smoking status: Former Smoker    Last attempt to quit: 08/04/1998    Years since quitting: 18.9  . Smokeless tobacco: Never Used  Substance Use Topics  . Alcohol use: No  . Drug use: No     Allergies   Sulfa antibiotics   Review of Systems Review of Systems  Constitutional: Negative for fever.  Respiratory: Negative for shortness of breath.   Cardiovascular: Negative for chest pain.  Gastrointestinal: Negative for abdominal pain and vomiting.  Musculoskeletal: Negative for back pain and neck pain.  Skin: Negative for wound.  Neurological: Negative for numbness and headaches.  Hematological: Does not bruise/bleed easily.  Psychiatric/Behavioral: Negative for confusion.  All other systems reviewed and are negative.    Physical Exam Updated Vital Signs BP 132/74 (BP Location: Left Arm)   Pulse 63   Temp 98.4 F (36.9 C) (Oral)   Resp 18   Ht 5\' 4"  (1.626 m)   Wt 117.9 kg (260 lb)   SpO2 98%   BMI 44.63 kg/m   Physical Exam Physical Exam  Nursing note and vitals reviewed. Constitutional: Well developed, well nourished, non-toxic, and in no acute distress Head: Normocephalic and atraumatic.  Mouth/Throat: Oropharynx is clear and moist.  Neck: Normal range of motion. Neck supple. no cervical spine tenderness Cardiovascular: Normal rate and regular rhythm.   Pulmonary/Chest: Effort normal and breath sounds normal. right lower anterior chest wall pain Abdominal: Soft. There is no tenderness. There is no rebound and no guarding.  Musculoskeletal: deformity to right wrist with pain with ROM. Tenderness over right hip and right knee w/o  obvious deformity  Neurological: Alert, no facial droop, fluent speech, moves all extremities symmetrically, PERRL, EOMI, sensation to light touch in tact throughout, full strength in bilateral upper and lower extremities (RUE limited by pain) Skin: Skin is warm and dry.  Psychiatric: Cooperative   ED Treatments / Results  Labs (all labs ordered are listed, but only abnormal results are displayed) Labs Reviewed  CBC WITH DIFFERENTIAL/PLATELET - Abnormal; Notable for the following components:      Result Value   Hemoglobin 10.9 (*)    HCT 34.4 (*)    All other components within normal limits  BASIC METABOLIC PANEL - Abnormal; Notable for the following components:   Glucose, Bld 103 (*)    Calcium 8.8 (*)    All other components within normal limits    EKG  EKG Interpretation None       Radiology Dg Ribs Unilateral W/chest Right  Result Date: 07/06/2017 CLINICAL DATA:  Fall striking the right side of the chest with right-sided pain. EXAM: RIGHT RIBS AND CHEST - 3+ VIEW COMPARISON:  03/10/2008 FINDINGS: Heart size is normal. Large hiatal hernia is noted. Lungs are clear. There is an old healed fracture of the right posterior fifth rib. Skin marker placed in the area of concern is at the lower anterior right ribs. No fracture seen in that region. IMPRESSION: No active cardiopulmonary disease. No acute rib fracture. Old healed right fifth rib fracture. Hiatal hernia. Electronically Signed   By: Nelson Chimes M.D.   On: 07/06/2017 12:55   Dg Wrist Complete Right  Result Date: 07/06/2017 CLINICAL DATA:  Wrist pain and swelling after falling today. Initial encounter. EXAM: RIGHT WRIST - COMPLETE 3+ VIEW COMPARISON:  None. FINDINGS: The bones are demineralized. There is an acute, comminuted, impacted and dorsally displaced intra-articular fracture of the distal radius. This fracture extends into  the radiocarpal and distal radioulnar joints. There is a mildly displaced fracture of the ulnar  styloid. The carpal bones are located and intact. Mild degenerative changes are present in the radial aspect of the wrist. There is moderate soft tissue swelling around the fractures. IMPRESSION: Comminuted, impacted and displaced intra-articular fracture of the distal radius. Mildly displaced fracture of the ulnar styloid. Electronically Signed   By: Richardean Sale M.D.   On: 07/06/2017 12:54   Dg Knee Complete 4 Views Right  Result Date: 07/06/2017 CLINICAL DATA:  Fall on the right side.  Pain. EXAM: RIGHT KNEE - COMPLETE 4+ VIEW COMPARISON:  None. FINDINGS: No evidence of fracture, dislocation, or joint effusion. No evidence of arthropathy or other focal bone abnormality. Soft tissues are unremarkable. Lateral view is off axis. IMPRESSION: Negative. Electronically Signed   By: Nelson Chimes M.D.   On: 07/06/2017 12:56   Dg Hip Unilat W Or Wo Pelvis 2-3 Views Right  Result Date: 07/06/2017 CLINICAL DATA:  Fall on the right side.  Pain. EXAM: DG HIP (WITH OR WITHOUT PELVIS) 2-3V RIGHT COMPARISON:  None. FINDINGS: There is no evidence of hip fracture or dislocation. There is no evidence of arthropathy or other focal bone abnormality. IMPRESSION: Negative. Electronically Signed   By: Nelson Chimes M.D.   On: 07/06/2017 12:55    Procedures Procedures (including critical care time) SPLINT APPLICATION Date/Time: 3:24 PM Authorized by: Forde Dandy Consent: Verbal consent obtained. Risks and benefits: risks, benefits and alternatives were discussed Consent given by: patient Splint applied by: technician Location details: right wrist Splint type: sugartong Supplies used: fiberglass Post-procedure: The splinted body part was neurovascularly unchanged following the procedure. Patient tolerance: Patient tolerated the procedure well with no immediate complications.    Medications Ordered in ED Medications  morphine 4 MG/ML injection 4 mg (4 mg Intravenous Given 07/06/17 1225)  HYDROmorphone  (DILAUDID) injection 1 mg (1 mg Intravenous Given 07/06/17 1307)     Initial Impression / Assessment and Plan / ED Course  I have reviewed the triage vital signs and the nursing notes.  Pertinent labs & imaging results that were available during my care of the patient were reviewed by me and considered in my medical decision making (see chart for details).     Presents after fall in the setting of dizziness, which is now resolved.  Symptoms consistent with vertigo, she states that she did not feel lightheaded or had syncope/near syncope.  She has a normal neurological exam, and I doubt central cerebellar process at this time.  She does complain of injuries over her right side after her fall.  X-rays are visualized she does have a distal radius fracture with intra-articular involvement of the right wrist.  This is discussed with Dr. Caralyn Guile who recommended splinting in the ED.  He does have opening in his office today with the PA for scheduling for operative repair. Patient is placed in sugartong splint and discharged to Dr. Angus Palms office.  She has no other obvious injuries on exam, and the right hip, chest wall, right knee are overall unremarkable without obvious fractures. Strict return and follow-up instructions reviewed. She expressed understanding of all discharge instructions and felt comfortable with the plan of care.    Final Clinical Impressions(s) / ED Diagnoses   Final diagnoses:  Closed fracture of distal end of right radius, unspecified fracture morphology, initial encounter    ED Discharge Orders        Ordered    oxyCODONE-acetaminophen (PERCOCET/ROXICET)  5-325 MG tablet  Every 6 hours PRN     07/06/17 1407       Forde Dandy, MD 07/06/17 1452

## 2017-07-06 NOTE — ED Triage Notes (Signed)
She got dizzy, lost her balance and fell hitting the right side of her face on the concrete pavement. No LOC. Bruising noted to her temple. Pain in her right ribs, forearm and leg.

## 2017-07-07 NOTE — H&P (Signed)
[name] is an 61 y.o. female.   Chief Complaint: RIGHT WRIST PAIN  HPI: [name] IS A 61 Y/O RIGHT HAND DOMINANT FEMALE WHO SUSTAINED AN INJURY TO THE RIGHT WRIST ON 07/06/17 AFTER LOSING HER BALANCE AND FALLING ON AN OUTSTRETCHED ARM.  SHE WAS SEEN IN THE EMERGENCY DEPARTMENT INITIALLY WHERE SHE WAS PUT INTO A SUGAR TONG SPLINT.  SHE WAS SEEN IN OUR OFFICE FOR FURTHER EVALUATION. DISCUSSED THE REASON AND RATIONALE FOR SURGERY AND THE USE OF A PLATE AND SCREWS FOR INTERNAL FIXATION.  DISCUSSED THE SURGICAL PROCEDURE, INCLUDING THE RISKS VERSUS BENEFITS, AND THE POST-OPERATIVE RECOVERY.  THE PATIENT WAS TO REMAIN IN THE SUGAR TONG SPLINT UNTIL THE TIME OF SURGERY.  SHE IS HERE TODAY FOR SURGERY.   Past Medical History:  Diagnosis Date  . Allergy    [name], Flonase  . Anemia   . Anxiety   . Arthritis   . Chronic kidney disease   . Colon polyps   . Depression   . Fibromyalgia   . Gastritis   . GERD (gastroesophageal reflux disease)   . Hernia, hiatal   . HLD (hyperlipidemia)   . Hypertension   . Migraine   . Neuropathy   . Osteoporosis   . Pneumonia   . Sleep apnea with use of continuous positive airway pressure (CPAP)   . Thyroid goiter     Past Surgical History:  Procedure Laterality Date  . ABDOMINAL HYSTERECTOMY     ovaries intact; DUB.  No dysplasia.  Marland Kitchen PARTIAL HYSTERECTOMY    . ROTATOR CUFF REPAIR Right 2009  . SHOULDER SURGERY Right    shoulder dislocation, fall, and elbow unla nerve  . SHOULDER SURGERY Right 2015   labral tear  . TUBAL LIGATION    . ULNAR NERVE REPAIR Right 08/19/2014    Family History  Problem Relation Age of Onset  . Heart disease Mother 73       AMI/CAD/CHF as cause of death  . Hypertension Mother   . Hyperlipidemia Mother   . Hypothyroidism Mother   . Depression Mother   . Gout Mother   . Heart disease Father 30       AMI  . Hypertension Father   . Stroke Father 44       mild CVA  . Prostate cancer Father   .  Hyperlipidemia Father   . Cancer Father 60       prostate cancer  . Hypertension Brother   . Hyperlipidemia Brother   . Cancer Brother        prostate cancer  . Cancer Maternal Grandmother        type unknown  . Cancer Maternal Grandfather        type unknown  . Heart disease Paternal Grandmother   . Heart defect Paternal Grandfather   . Melanoma Brother   . Hyperlipidemia Brother   . Hypertension Brother   . Cancer Brother        melanoma  . Hypertension Brother   . Hyperlipidemia Brother   . Melanoma Brother   . Cancer Brother 25       melanoma  . Cancer Sister        Basal cell carcinoma scalp  . Depression Sister   . Hypertension Brother   . Depression Brother   . Hypertension Brother   . Gout Brother   . Arthritis Sister   . Depression Sister    Social History:  reports that she quit smoking about 87  years ago. she has never used smokeless tobacco. She reports that she does not drink alcohol or use drugs.  Allergies:  Allergies  Allergen Reactions  . Sulfa Antibiotics     No medications prior to admission.    Results for orders placed or performed during the hospital encounter of 07/06/17 (from the past 48 hour(s))  CBC with Differential     Status: Abnormal   Collection Time: 07/06/17 12:17 PM  Result Value Ref Range   WBC 6.6 4.0 - 10.5 K/uL   RBC 3.97 3.87 - 5.11 MIL/uL   Hemoglobin 10.9 (L) 12.0 - 15.0 g/dL   HCT 34.4 (L) 36.0 - 46.0 %   MCV 86.6 78.0 - 100.0 fL   MCH 27.5 26.0 - 34.0 pg   MCHC 31.7 30.0 - 36.0 g/dL   RDW 14.1 11.5 - 15.5 %   Platelets 229 150 - 400 K/uL   Neutrophils Relative % 63 %   Neutro Abs 4.2 1.7 - 7.7 K/uL   Lymphocytes Relative 27 %   Lymphs Abs 1.8 0.7 - 4.0 K/uL   Monocytes Relative 8 %   Monocytes Absolute 0.5 0.1 - 1.0 K/uL   Eosinophils Relative 2 %   Eosinophils Absolute 0.1 0.0 - 0.7 K/uL   Basophils Relative 0 %   Basophils Absolute 0.0 0.0 - 0.1 K/uL  Basic metabolic panel     Status: Abnormal    Collection Time: 07/06/17 12:17 PM  Result Value Ref Range   Sodium 137 135 - 145 mmol/L   Potassium 4.2 3.5 - 5.1 mmol/L   Chloride 109 101 - 111 mmol/L   CO2 22 22 - 32 mmol/L   Glucose, Bld 103 (H) 65 - 99 mg/dL   BUN 13 6 - 20 mg/dL   Creatinine, Ser 0.92 0.44 - 1.00 mg/dL   Calcium 8.8 (L) 8.9 - 10.3 mg/dL   GFR calc non Af Amer >60 >60 mL/min   GFR calc Af Amer >60 >60 mL/min    Comment: (NOTE) The eGFR has been calculated using the CKD EPI equation. This calculation has not been validated in all clinical situations. eGFR's persistently <60 mL/min signify possible Chronic Kidney Disease.    Anion gap 6 5 - 15   Dg Ribs Unilateral W/chest Right  Result Date: 07/06/2017 CLINICAL DATA:  Fall striking the right side of the chest with right-sided pain. EXAM: RIGHT RIBS AND CHEST - 3+ VIEW COMPARISON:  03/10/2008 FINDINGS: Heart size is normal. Large hiatal hernia is noted. Lungs are clear. There is an old healed fracture of the right posterior fifth rib. Skin marker placed in the area of concern is at the lower anterior right ribs. No fracture seen in that region. IMPRESSION: No active cardiopulmonary disease. No acute rib fracture. Old healed right fifth rib fracture. Hiatal hernia. Electronically Signed   By: Nelson Chimes M.D.   On: 07/06/2017 12:55   Dg Wrist Complete Right  Result Date: 07/06/2017 CLINICAL DATA:  Wrist pain and swelling after falling today. Initial encounter. EXAM: RIGHT WRIST - COMPLETE 3+ VIEW COMPARISON:  None. FINDINGS: The bones are demineralized. There is an acute, comminuted, impacted and dorsally displaced intra-articular fracture of the distal radius. This fracture extends into the radiocarpal and distal radioulnar joints. There is a mildly displaced fracture of the ulnar styloid. The carpal bones are located and intact. Mild degenerative changes are present in the radial aspect of the wrist. There is moderate soft tissue swelling around the fractures.  IMPRESSION: Comminuted,  impacted and displaced intra-articular fracture of the distal radius. Mildly displaced fracture of the ulnar styloid. Electronically Signed   By: Richardean Sale M.D.   On: 07/06/2017 12:54   Dg Knee Complete 4 Views Right  Result Date: 07/06/2017 CLINICAL DATA:  Fall on the right side.  Pain. EXAM: RIGHT KNEE - COMPLETE 4+ VIEW COMPARISON:  None. FINDINGS: No evidence of fracture, dislocation, or joint effusion. No evidence of arthropathy or other focal bone abnormality. Soft tissues are unremarkable. Lateral view is off axis. IMPRESSION: Negative. Electronically Signed   By: Nelson Chimes M.D.   On: 07/06/2017 12:56   Dg Hip Unilat W Or Wo Pelvis 2-3 Views Right  Result Date: 07/06/2017 CLINICAL DATA:  Fall on the right side.  Pain. EXAM: DG HIP (WITH OR WITHOUT PELVIS) 2-3V RIGHT COMPARISON:  None. FINDINGS: There is no evidence of hip fracture or dislocation. There is no evidence of arthropathy or other focal bone abnormality. IMPRESSION: Negative. Electronically Signed   By: Nelson Chimes M.D.   On: 07/06/2017 12:55    ROS NO RECENT ILLNESSES OR HOSPITALIZATIONS  There were no vitals taken for this visit. Physical Exam  General Appearance:  Alert, cooperative, no distress, appears stated age  Head:  Normocephalic, without obvious abnormality, atraumatic  Eyes:  Pupils equal, conjunctiva/corneas clear,         Throat: Lips, mucosa, and tongue normal; teeth and gums normal  Neck: No visible masses     Lungs:   respirations unlabored  Chest Wall:  No tenderness or deformity  Heart:  Regular rate and rhythm,  Abdomen:   Soft, non-tender,         Extremities: RUE: GENERALIZED TENDERNESS THROUGHOUT THE WRIST. SENSATION INTACT TO LIGHT TOUCH. CAPILLARY REFILL LESS THAN 2 SECONDS. HEALING ABRASION ON THE DORSAL ASPECT OF THE LONG FINGER WITH NO ERYTHEMA OR PURULENT DRAINAGE. ABLE TO WIGGLE ALL FINGERS.  Pulses: 2+ and symmetric  Skin: Skin color, texture, turgor  normal, no rashes or lesions     Neurologic: Normal    Assessment RIGHT DISTAL RADIUS FRACTURE  Plan RIGHT DISTAL RADIUS OPEN REDUCTION AND INTERNAL FIXATION WITH REPAIR AS INDICATED R/B/A DISCUSSED WITH PT IN OFFICE.  PT VOICED UNDERSTANDING OF PLAN CONSENT SIGNED DAY OF SURGERY PT SEEN AND EXAMINED PRIOR TO OPERATIVE PROCEDURE/DAY OF SURGERY SITE MARKED. QUESTIONS ANSWERED WILL GO HOME FOLLOWING SURGERY  WE ARE PLANNING SURGERY FOR YOUR UPPER EXTREMITY. THE RISKS AND BENEFITS OF SURGERY INCLUDE BUT NOT LIMITED TO BLEEDING INFECTION, DAMAGE TO NEARBY NERVES ARTERIES TENDONS, FAILURE OF SURGERY TO ACCOMPLISH ITS INTENDED GOALS, PERSISTENT SYMPTOMS AND NEED FOR FURTHER SURGICAL INTERVENTION. WITH THIS IN MIND WE WILL PROCEED. I HAVE DISCUSSED WITH THE PATIENT THE PRE AND POSTOPERATIVE REGIMEN AND THE DOS AND DON'TS. PT VOICED UNDERSTANDING AND INFORMED CONSENT SIGNED.    Brynda Peon 07/07/2017, 4:08 PM

## 2017-07-08 ENCOUNTER — Encounter (HOSPITAL_COMMUNITY): Payer: Self-pay

## 2017-07-08 ENCOUNTER — Other Ambulatory Visit: Payer: Self-pay

## 2017-07-08 NOTE — Progress Notes (Signed)
PCP - Dr. Doran Stabler  Cardiologist - Dr. Glenetta Hew  Chest x-ray - Denies  EKG - 07/06/17 (E)  Stress Test - Denies  ECHO - 06/24/17 (E)  Cardiac Cath - Denies  Sleep Study - Yes CPAP - Yes- Does not know settings  LABS- 07/06/17 CBC, BMP  Chart will be given to anesthesia for review due pt recent cardiac studies. Pt sts she was referred to a cardiologist due to frequent dizzy spells in November of this year.   Pt denies having chest pain, dizziness, sob, or fever during the phone interview. Pt to arrive to the Short Stay area at 1300 on 07/09/17. All instructions explained to the pt, with a verbal understanding. The opportunity to ask questions was provided.

## 2017-07-08 NOTE — Progress Notes (Signed)
Anesthesia Chart Review:   Pt is a same day work up.   Pt is a 61 year old female scheduled for ORIF R wrist distal radial fracture on 07/09/2017 with Iran Planas, MD  - PCP is Reginia Forts, MD  - Saw cardiologist is Glenetta Hew, MD for tachycardia 06/11/17. Echo and holter ordered. Echo results below.  Final report not yet available for holter, preliminary results below.     PMH includes:  HTN, hyperlipidemia, OSA, anemia, CKD, GERD. Former smoker. BMI 45  - ED visit 07/06/17 for vertigo, fall, R wrist fx.   Medications include: Lipitor, dexilant, lisinopril, propranolol, Zantac  Labs from ED 07/06/17 reviewed.  BMET and CBC are acceptable for surgery.   Echo 06/24/17:  - Left ventricle: The cavity size was normal. Wall thickness was normal. Systolic function was normal. The estimated ejection fraction was in the range of 55% to 60%. Wall motion was normal; there were no regional wall motion abnormalities. Doppler parameters are consistent with abnormal left ventricular relaxation (grade 1 diastolic dysfunction). The E/e&' ratio is between 8-15, suggesting indeterminate LV filling pressure. - Left atrium: The atrium was normal in size. - Inferior vena cava: The vessel was normal in size. The respirophasic diameter changes were in the normal range (>= 50%), consistent with normal central venous pressure. - Impressions: LVEF 55-60%, normal wall thickness, normal wall motion, grade 1 DD, indeterminate LV filling pressure, normal LA size, normal IVC.  Holter monitor 06/24/17 (preliminary- final report not yet available):  - Sinus Rhythm.  - Atrial Run, Atrial Pairs, and PACs were noted.  - One PVC noted.  - Diary Events/Entries correlate with Sinus Rhythm.  If no changes, I anticipate pt can proceed with surgery as scheduled.   Willeen Cass, FNP-[facility]/Anesthesiology Phone: [phone] 07/08/2017 3:21 PM

## 2017-07-09 ENCOUNTER — Encounter (HOSPITAL_COMMUNITY)
Admission: RE | Disposition: A | Discharge status: Home / Self Care | Payer: Self-pay | Source: Ambulatory Visit | Attending: Orthopedic Surgery

## 2017-07-09 ENCOUNTER — Ambulatory Visit (HOSPITAL_COMMUNITY): Payer: 59 | Admitting: Emergency Medicine

## 2017-07-09 ENCOUNTER — Encounter (HOSPITAL_COMMUNITY): Payer: Self-pay | Admitting: Certified Registered Nurse Anesthetist

## 2017-07-09 ENCOUNTER — Ambulatory Visit (HOSPITAL_COMMUNITY)
Admission: RE | Admit: 2017-07-09 | Discharge: 2017-07-09 | Disposition: A | Discharge status: Home / Self Care | Payer: 59 | Source: Ambulatory Visit | Attending: Orthopedic Surgery | Admitting: Orthopedic Surgery

## 2017-07-09 DIAGNOSIS — M810 Age-related osteoporosis without current pathological fracture: Secondary | ICD-10-CM | POA: Insufficient documentation

## 2017-07-09 DIAGNOSIS — D631 Anemia in chronic kidney disease: Secondary | ICD-10-CM | POA: Diagnosis not present

## 2017-07-09 DIAGNOSIS — E785 Hyperlipidemia, unspecified: Secondary | ICD-10-CM | POA: Diagnosis not present

## 2017-07-09 DIAGNOSIS — Z87891 Personal history of nicotine dependence: Secondary | ICD-10-CM | POA: Insufficient documentation

## 2017-07-09 DIAGNOSIS — F419 Anxiety disorder, unspecified: Secondary | ICD-10-CM | POA: Diagnosis not present

## 2017-07-09 DIAGNOSIS — M797 Fibromyalgia: Secondary | ICD-10-CM | POA: Diagnosis not present

## 2017-07-09 DIAGNOSIS — G4733 Obstructive sleep apnea (adult) (pediatric): Secondary | ICD-10-CM | POA: Insufficient documentation

## 2017-07-09 DIAGNOSIS — Z79899 Other long term (current) drug therapy: Secondary | ICD-10-CM | POA: Insufficient documentation

## 2017-07-09 DIAGNOSIS — I129 Hypertensive chronic kidney disease with stage 1 through stage 4 chronic kidney disease, or unspecified chronic kidney disease: Secondary | ICD-10-CM | POA: Diagnosis not present

## 2017-07-09 DIAGNOSIS — G629 Polyneuropathy, unspecified: Secondary | ICD-10-CM | POA: Insufficient documentation

## 2017-07-09 DIAGNOSIS — G8918 Other acute postprocedural pain: Secondary | ICD-10-CM | POA: Diagnosis not present

## 2017-07-09 DIAGNOSIS — S52571A Other intraarticular fracture of lower end of right radius, initial encounter for closed fracture: Secondary | ICD-10-CM | POA: Diagnosis not present

## 2017-07-09 DIAGNOSIS — W010XXA Fall on same level from slipping, tripping and stumbling without subsequent striking against object, initial encounter: Secondary | ICD-10-CM | POA: Diagnosis not present

## 2017-07-09 DIAGNOSIS — F329 Major depressive disorder, single episode, unspecified: Secondary | ICD-10-CM | POA: Insufficient documentation

## 2017-07-09 DIAGNOSIS — K219 Gastro-esophageal reflux disease without esophagitis: Secondary | ICD-10-CM | POA: Diagnosis not present

## 2017-07-09 DIAGNOSIS — N189 Chronic kidney disease, unspecified: Secondary | ICD-10-CM | POA: Diagnosis not present

## 2017-07-09 HISTORY — PX: OPEN REDUCTION INTERNAL FIXATION (ORIF) DISTAL RADIAL FRACTURE: SHX5989

## 2017-07-09 SURGERY — OPEN REDUCTION INTERNAL FIXATION (ORIF) DISTAL RADIUS FRACTURE
Anesthesia: General | Laterality: Right

## 2017-07-09 MED ORDER — 0.9 % SODIUM CHLORIDE (POUR BTL) OPTIME
TOPICAL | Status: DC | PRN
  Administered 2017-07-09: 1000 mL

## 2017-07-09 MED ORDER — FENTANYL CITRATE (PF) 100 MCG/2ML IJ SOLN
INTRAMUSCULAR | Status: AC
  Administered 2017-07-09: 50 ug via INTRAVENOUS
  Filled 2017-07-09: qty 2

## 2017-07-09 MED ORDER — LIDOCAINE 2% (20 MG/ML) 5 ML SYRINGE
INTRAMUSCULAR | Status: AC
  Filled 2017-07-09: qty 5

## 2017-07-09 MED ORDER — PROMETHAZINE HCL 25 MG/ML IJ SOLN: 6.2500 mg | INTRAMUSCULAR | Status: DC | PRN

## 2017-07-09 MED ORDER — ONDANSETRON HCL 4 MG/2ML IJ SOLN
INTRAMUSCULAR | Status: AC
  Filled 2017-07-09: qty 2

## 2017-07-09 MED ORDER — PROPOFOL 10 MG/ML IV BOLUS
INTRAVENOUS | Status: AC
  Filled 2017-07-09: qty 20

## 2017-07-09 MED ORDER — FENTANYL CITRATE (PF) 100 MCG/2ML IJ SOLN: 25.0000 ug | INTRAMUSCULAR | Status: DC | PRN

## 2017-07-09 MED ORDER — LIDOCAINE 2% (20 MG/ML) 5 ML SYRINGE
INTRAMUSCULAR | Status: DC | PRN
  Administered 2017-07-09: 60 mg via INTRAVENOUS

## 2017-07-09 MED ORDER — DEXAMETHASONE SODIUM PHOSPHATE 10 MG/ML IJ SOLN
INTRAMUSCULAR | Status: AC
  Filled 2017-07-09: qty 1

## 2017-07-09 MED ORDER — DEXAMETHASONE SODIUM PHOSPHATE 10 MG/ML IJ SOLN
INTRAMUSCULAR | Status: DC | PRN
  Administered 2017-07-09: 10 mg via INTRAVENOUS

## 2017-07-09 MED ORDER — MIDAZOLAM HCL 2 MG/2ML IJ SOLN
INTRAMUSCULAR | Status: AC
  Administered 2017-07-09: 1 mg via INTRAVENOUS
  Filled 2017-07-09: qty 2

## 2017-07-09 MED ORDER — LACTATED RINGERS IV SOLN
INTRAVENOUS | Status: DC
  Administered 2017-07-09 (×2): via INTRAVENOUS

## 2017-07-09 MED ORDER — ONDANSETRON HCL 4 MG/2ML IJ SOLN
INTRAMUSCULAR | Status: DC | PRN
  Administered 2017-07-09: 4 mg via INTRAVENOUS

## 2017-07-09 MED ORDER — BUPIVACAINE-EPINEPHRINE (PF) 0.5% -1:200000 IJ SOLN
INTRAMUSCULAR | Status: DC | PRN
  Administered 2017-07-09: 30 mL via PERINEURAL

## 2017-07-09 MED ORDER — PROPOFOL 10 MG/ML IV BOLUS
INTRAVENOUS | Status: DC | PRN
  Administered 2017-07-09: 40 mg via INTRAVENOUS
  Administered 2017-07-09: 200 mg via INTRAVENOUS

## 2017-07-09 MED ORDER — CHLORHEXIDINE GLUCONATE 4 % EX LIQD: 60.0000 mL | Freq: Once | CUTANEOUS | Status: DC

## 2017-07-09 MED ORDER — FENTANYL CITRATE (PF) 100 MCG/2ML IJ SOLN
INTRAMUSCULAR | Status: DC | PRN
Start: 2017-07-09 — End: 2017-07-09
  Administered 2017-07-09 (×3): 25 ug via INTRAVENOUS

## 2017-07-09 MED ORDER — FENTANYL CITRATE (PF) 100 MCG/2ML IJ SOLN
50.0000 ug | Freq: Once | INTRAMUSCULAR | Status: AC
  Administered 2017-07-09: 50 ug via INTRAVENOUS
  Filled 2017-07-09: qty 1

## 2017-07-09 MED ORDER — FENTANYL CITRATE (PF) 250 MCG/5ML IJ SOLN
INTRAMUSCULAR | Status: AC
  Filled 2017-07-09: qty 5

## 2017-07-09 MED ORDER — CEFAZOLIN SODIUM-DEXTROSE 2-4 GM/100ML-% IV SOLN
2.0000 g | INTRAVENOUS | Status: AC
  Administered 2017-07-09: 2 g via INTRAVENOUS
  Filled 2017-07-09: qty 100

## 2017-07-09 MED ORDER — MIDAZOLAM HCL 2 MG/2ML IJ SOLN
1.0000 mg | Freq: Once | INTRAMUSCULAR | Status: AC
  Administered 2017-07-09: 1 mg via INTRAVENOUS
  Filled 2017-07-09: qty 1

## 2017-07-09 SURGICAL SUPPLY — 60 items
BANDAGE ACE 3X5.8 VEL STRL LF (GAUZE/BANDAGES/DRESSINGS) ×1 IMPLANT
BANDAGE ACE 4X5 VEL STRL LF (GAUZE/BANDAGES/DRESSINGS) ×3 IMPLANT
BIT DRILL 2.2 SS TIBIAL (BIT) ×2 IMPLANT
BLADE CLIPPER SURG (BLADE) IMPLANT
BNDG CMPR 9X4 STRL LF SNTH (GAUZE/BANDAGES/DRESSINGS) ×1
BNDG ESMARK 4X9 LF (GAUZE/BANDAGES/DRESSINGS) ×3 IMPLANT
BNDG GAUZE ELAST 4 BULKY (GAUZE/BANDAGES/DRESSINGS) ×3 IMPLANT
CANISTER SUCT 3000ML PPV (MISCELLANEOUS) ×3 IMPLANT
CORDS BIPOLAR (ELECTRODE) ×3 IMPLANT
COVER SURGICAL LIGHT HANDLE (MISCELLANEOUS) ×3 IMPLANT
CUFF TOURNIQUET SINGLE 18IN (TOURNIQUET CUFF) ×3 IMPLANT
CUFF TOURNIQUET SINGLE 24IN (TOURNIQUET CUFF) IMPLANT
DRAPE OEC MINIVIEW 54X84 (DRAPES) ×3 IMPLANT
DRAPE SURG 17X11 SM STRL (DRAPES) ×3 IMPLANT
DRSG ADAPTIC 3X8 NADH LF (GAUZE/BANDAGES/DRESSINGS) ×3 IMPLANT
GAUZE SPONGE 4X4 12PLY STRL (GAUZE/BANDAGES/DRESSINGS) ×3 IMPLANT
GAUZE SPONGE 4X4 12PLY STRL LF (GAUZE/BANDAGES/DRESSINGS) ×2 IMPLANT
GAUZE SPONGE 4X4 16PLY XRAY LF (GAUZE/BANDAGES/DRESSINGS) ×1 IMPLANT
GAUZE XEROFORM 5X9 LF (GAUZE/BANDAGES/DRESSINGS) ×1 IMPLANT
GLOVE BIOGEL PI IND STRL 8.5 (GLOVE) ×1 IMPLANT
GLOVE BIOGEL PI INDICATOR 8.5 (GLOVE) ×2
GLOVE SURG ORTHO 8.0 STRL STRW (GLOVE) ×3 IMPLANT
GOWN STRL REUS W/ TWL LRG LVL3 (GOWN DISPOSABLE) ×1 IMPLANT
GOWN STRL REUS W/ TWL XL LVL3 (GOWN DISPOSABLE) ×1 IMPLANT
GOWN STRL REUS W/TWL LRG LVL3 (GOWN DISPOSABLE) ×3
GOWN STRL REUS W/TWL XL LVL3 (GOWN DISPOSABLE) ×3
K-WIRE 1.6 (WIRE) ×3
K-WIRE FX5X1.6XNS BN SS (WIRE) ×1
KIT BASIN OR (CUSTOM PROCEDURE TRAY) ×3 IMPLANT
KIT ROOM TURNOVER OR (KITS) ×3 IMPLANT
KWIRE FX5X1.6XNS BN SS (WIRE) IMPLANT
NDL HYPO 25X1 1.5 SAFETY (NEEDLE) ×1 IMPLANT
NEEDLE HYPO 25X1 1.5 SAFETY (NEEDLE) IMPLANT
NS IRRIG 1000ML POUR BTL (IV SOLUTION) ×3 IMPLANT
PACK ORTHO EXTREMITY (CUSTOM PROCEDURE TRAY) ×3 IMPLANT
PAD ARMBOARD 7.5X6 YLW CONV (MISCELLANEOUS) ×6 IMPLANT
PAD CAST 4YDX4 CTTN HI CHSV (CAST SUPPLIES) ×1 IMPLANT
PADDING CAST COTTON 4X4 STRL (CAST SUPPLIES) ×3
PADDING CAST SYNTHETIC 4 (CAST SUPPLIES)
PADDING CAST SYNTHETIC 4X4 STR (CAST SUPPLIES) IMPLANT
PEG LOCKING SMOOTH 2.2X18 (Peg) ×6 IMPLANT
PEG LOCKING SMOOTH 2.2X20 (Screw) ×2 IMPLANT
PEG LOCKING SMOOTH 2.2X22 (Screw) ×6 IMPLANT
PEG LOCKING SMOOTH 2.2X24 (Peg) ×2 IMPLANT
PLATE DVR CROSSLOCK STD RT (Plate) ×2 IMPLANT
SCREW LOCK 14X2.7X 3 LD TPR (Screw) IMPLANT
SCREW LOCK 16X2.7X 3 LD TPR (Screw) IMPLANT
SCREW LOCKING 2.7X14 (Screw) ×12 IMPLANT
SCREW LOCKING 2.7X16 (Screw) ×3 IMPLANT
SOAP 2 % CHG 4 OZ (WOUND CARE) ×3 IMPLANT
SPLINT FIBERGLASS 3X35 (CAST SUPPLIES) ×2 IMPLANT
SPONGE LAP 4X18 X RAY DECT (DISPOSABLE) ×3 IMPLANT
SUT PROLENE 4 0 PS 2 18 (SUTURE) ×2 IMPLANT
SUT VIC AB 2-0 CT1 27 (SUTURE) ×3
SUT VIC AB 2-0 CT1 TAPERPNT 27 (SUTURE) IMPLANT
SUT VICRYL 4-0 PS2 18IN ABS (SUTURE) ×2 IMPLANT
SYR CONTROL 10ML LL (SYRINGE) IMPLANT
TUBE CONNECTING 12'X1/4 (SUCTIONS) ×1
TUBE CONNECTING 12X1/4 (SUCTIONS) ×2 IMPLANT
YANKAUER SUCT BULB TIP NO VENT (SUCTIONS) IMPLANT

## 2017-07-09 NOTE — Anesthesia Preprocedure Evaluation (Addendum)
Anesthesia Evaluation  Patient identified by MRN, date of birth, ID band Patient awake    Reviewed: Allergy & Precautions, NPO status , Patient's Chart, lab work & pertinent test results, reviewed documented beta blocker date and time   Airway Mallampati: II  TM Distance: >3 FB Neck ROM: Full    Dental  (+) Dental Advisory Given, Chipped,    Pulmonary sleep apnea and Continuous Positive Airway Pressure Ventilation , former smoker,    Pulmonary exam normal breath sounds clear to auscultation       Cardiovascular hypertension, Pt. on home beta blockers and Pt. on medications + DOE  (-) Past MI Normal cardiovascular exam Rhythm:Regular Rate:Normal  HLD  TTE 2018 - EF was in the range of 55% to 60%. Grade 1 diastolic dysfunction). Trivial PR   Neuro/Psych  Headaches, Anxiety Depression Ulnar distribution neuropathy right arm from elbow to fingers  Neuromuscular disease    GI/Hepatic Neg liver ROS, hiatal hernia, GERD  ,  Endo/Other  neg diabetesMorbid obesity  Renal/GU CRFRenal disease  negative genitourinary   Musculoskeletal  (+) Arthritis , Fibromyalgia -, narcotic dependent  Abdominal (+) + obese,   Peds  Hematology  (+) anemia ,   Anesthesia Other Findings   Reproductive/Obstetrics                           Anesthesia Physical Anesthesia Plan  ASA: III  Anesthesia Plan: General   Post-op Pain Management:  Regional for Post-op pain   Induction: Intravenous  PONV Risk Score and Plan: 3 and Treatment may vary due to age or medical condition, Ondansetron, Dexamethasone and Midazolam  Airway Management Planned: LMA  Additional Equipment: None  Intra-op Plan:   Post-operative Plan: Extubation in OR  Informed Consent: I have reviewed the patients History and Physical, chart, labs and discussed the procedure including the risks, benefits and alternatives for the proposed anesthesia  with the patient or authorized representative who has indicated his/her understanding and acceptance.   Dental advisory given  Plan Discussed with: CRNA  Anesthesia Plan Comments:         Anesthesia Quick Evaluation

## 2017-07-09 NOTE — Transfer of Care (Signed)
Immediate Anesthesia Transfer of Care Note  Patient: [name]  Procedure(s) Performed: RIGHT WRIST OPEN REDUCTION INTERNAL FIXATION (ORIF) DISTAL RADIAL FRACTURE AND REPAIR AS INDICATED (Right )  Patient Location: PACU  Anesthesia Type:GA combined with regional for post-op pain  Level of Consciousness: awake, alert  and oriented  Airway & Oxygen Therapy: Patient Spontanous Breathing and Patient connected to face mask oxygen  Post-op Assessment: Report given to RN and Post -op Vital signs reviewed and stable  Post vital signs: Reviewed and stable  Last Vitals:  Vitals:   07/09/17 1425 07/09/17 1430  BP: (!) 144/69   Pulse: 78 82  Resp: 19 18  Temp:    SpO2: 94% 92%    Last Pain:  Vitals:   07/09/17 1425  TempSrc:   PainSc: 3          Complications: No apparent anesthesia complications

## 2017-07-09 NOTE — Op Note (Signed)
PREOPERATIVE DIAGNOSIS: Right wrist intra-articular distal radius fracture of 3 or more fragments  POSTOPERATIVE DIAGNOSIS: Same  ATTENDING SURGEON: Dr. Caralyn Guile is scrubbed and present for the entire procedure  ASSISTANT SURGEON: None  ANESTHESIA: Gen. via LMA was supraclavicular block  OPERATIVE PROCEDURE: #1: Open reduction internal fixation of displaced intra-articular distal radius fracture 3 or more fragments #2: Right wrist brachia radialis tendon release, tendon tenotomy #3: Radiographs 3 views right wrist  IMPLANTS: Standard DVR [employer] cross lock  RADIOGRAPHIC INTERPRETATION: AP lateral oblique views of the wrist to show the volar plate fixation in place in good alignment with good restoration of the radial height inclination and tilt  SURGICAL INDICATIONS: [name] is a right-hand-dominant female who sustained a closed intra-articular distal radius fracture. Patient was seen and evaluated in the office and recommended undergo the above procedure. Risks benefits and alternatives were discussed in detail with the patient in a signed informed consent was obtained. Risks include but not limited to bleeding infection damage to nearby nerves arteries or tendons nonunion malunion hardware failure loss of motion of the wrists and digits and need for further surgical intervention.  SURGICAL TECHNIQUE: Patient was properly identified in the preoperative holding area and a mark with a permanent marker made on the right wrist indicate the correct operative site. The patient was then brought back to the operating room placed supine on anesthesia and table where general anesthesia was administered. Patient tolerated this well. A well-padded tourniquet was then placed on the left brachium and sealed with the appropriate drape. Right upper extremity was then prepped and draped in normal sterile fashion. A timeout was called the correct site was identified and the procedure was then begun.  Attention was then turned to the left wrist. The limited bit elevated the tourniquet insufflated. Preoperative antibiotics were given prior to any skin incision. A longitudinal incision made directly over the FCR sheath. Dissection carried down through the skin subcutaneous tissue. Going through the floor the FCR sheath the FPL was swept out of the way. An L-shaped pronator quadratus flap was then elevated and the fracture site was then exposed. The brachia radialis was then carefully released off the radial styloid. Tendon tenotomy and release was done to allow for reduction of the radial column. This was an intra-articular fracture 3 or more fragments. Fracture hematoma was then evacuated open reduction was then performed. The volar plate was then applied and held distally with a K wire. The position was then confirmed using the C-arm. Plate position was then readjusted with the oblong screw hole proximally. After confirmation of the plate position and reduction distal fixation was then carried out from an ulnar to radial direction with a combination distal locking pegs and screws. Final shaft fixation was carried out combination of locking screws. The wound was thoroughly irrigated. The pronator quadratus was then closed with 2-0 Vicryl suture. The subcutaneous tissues closed with 4-0 Vicryl. Skin was then closed with simple 4-0 Prolene suture. Adaptic dressing and a sterile compressive bandage then applied. The patient was then placed in well-padded sugar tong splint and extubated taken recovery room in good condition.  POSTOPERATIVE PLAN: Patient to be discharged to home seen back in the office in 2 weeks for wound check suture removal x-rays application of a short arm cast place the therapy order for the four-week mark see her back at the four-week mark for radiographs cast off and begin a postoperative ORIF protocol.

## 2017-07-09 NOTE — Progress Notes (Signed)
Talked to Dr. Caralyn Guile, patient is going home and prescription has been called in.

## 2017-07-09 NOTE — Anesthesia Procedure Notes (Signed)
Procedure Name: LMA Insertion Date/Time: 07/09/2017 3:37 PM Performed by: Genelle Bal, CRNA Pre-anesthesia Checklist: Patient identified, Emergency Drugs available, Suction available and Patient being monitored Patient Re-evaluated:Patient Re-evaluated prior to induction Oxygen Delivery Method: Circle system utilized Preoxygenation: Pre-oxygenation with 100% oxygen Induction Type: IV induction Ventilation: Mask ventilation without difficulty LMA: LMA inserted LMA Size: 5.0 Number of attempts: 1 Airway Equipment and Method: Bite block Placement Confirmation: positive ETCO2 Tube secured with: Tape Dental Injury: Teeth and Oropharynx as per pre-operative assessment

## 2017-07-09 NOTE — Progress Notes (Signed)
Orthopedic Tech Progress Note Patient Details:  [name] [birth year] [id number]  Ortho Devices Type of Ortho Device: Arm sling Ortho Device/Splint Interventions: Application   Post Interventions Patient Tolerated: Well Instructions Provided: Care of device   [place] Pink 07/09/2017, 6:19 PM

## 2017-07-09 NOTE — Discharge Instructions (Signed)
KEEP BANDAGE CLEAN AND DRY CALL OFFICE FOR F/U APPT [phone] DR [facility] CELL [phone] KEEP HAND ELEVATED ABOVE HEART OK TO APPLY ICE TO OPERATIVE AREA CONTACT OFFICE IF ANY WORSENING PAIN OR CONCERNS.

## 2017-07-09 NOTE — Anesthesia Procedure Notes (Signed)
Anesthesia Regional Block: Supraclavicular block   Pre-Anesthetic Checklist: ,, timeout performed, Correct Patient, Correct Site, Correct Laterality, Correct Procedure, Correct Position, site marked, Risks and benefits discussed,  Surgical consent,  Pre-op evaluation,  At surgeon's request and post-op pain management  Laterality: Right  Prep: chloraprep       Needles:  Injection technique: Single-shot  Needle Type: Echogenic Needle     Needle Length: 9cm  Needle Gauge: 21     Additional Needles:   Narrative:  Start time: 07/09/2017 2:15 PM End time: 07/09/2017 2:20 PM Injection made incrementally with aspirations every 5 mL.  Performed by: Personally  Anesthesiologist: Audry Pili, MD  Additional Notes: No pain on injection. No increased resistance to injection. Injection made in 5cc increments. Good needle visualization. Patient tolerated the procedure well.

## 2017-07-10 ENCOUNTER — Encounter (HOSPITAL_COMMUNITY): Payer: Self-pay | Admitting: Orthopedic Surgery

## 2017-07-15 NOTE — Anesthesia Postprocedure Evaluation (Signed)
Anesthesia Post Note  Patient: [name]  Procedure(s) Performed: RIGHT WRIST OPEN REDUCTION INTERNAL FIXATION (ORIF) DISTAL RADIAL FRACTURE AND REPAIR AS INDICATED (Right )     Patient location during evaluation: PACU Anesthesia Type: General Level of consciousness: awake and alert Pain management: pain level controlled Vital Signs Assessment: post-procedure vital signs reviewed and stable Respiratory status: spontaneous breathing, nonlabored ventilation and respiratory function stable Cardiovascular status: blood pressure returned to baseline and stable Postop Assessment: no apparent nausea or vomiting Anesthetic complications: no    Last Vitals:  Vitals:   07/09/17 1801 07/09/17 1830  BP:  (!) 152/94  Pulse: 95 98  Resp: (!) 21 (!) 22  Temp: 36.4 C   SpO2: 100% 94%    Last Pain:  Vitals:   07/09/17 1830  TempSrc:   PainSc: 0-No pain                 Audry Pili

## 2017-07-20 ENCOUNTER — Ambulatory Visit (INDEPENDENT_AMBULATORY_CARE_PROVIDER_SITE_OTHER): Payer: 59 | Admitting: Family Medicine

## 2017-07-20 ENCOUNTER — Encounter: Payer: Self-pay | Admitting: Family Medicine

## 2017-07-20 ENCOUNTER — Other Ambulatory Visit: Payer: Self-pay

## 2017-07-20 VITALS — BP 122/80 | HR 87 | Temp 98.1°F | Resp 16 | Ht 65.16 in | Wt 263.0 lb

## 2017-07-20 DIAGNOSIS — R0602 Shortness of breath: Secondary | ICD-10-CM | POA: Diagnosis not present

## 2017-07-20 DIAGNOSIS — G43009 Migraine without aura, not intractable, without status migrainosus: Secondary | ICD-10-CM

## 2017-07-20 DIAGNOSIS — M7989 Other specified soft tissue disorders: Secondary | ICD-10-CM | POA: Diagnosis not present

## 2017-07-20 DIAGNOSIS — R000 Tachycardia, unspecified: Secondary | ICD-10-CM

## 2017-07-20 DIAGNOSIS — I5189 Other ill-defined heart diseases: Secondary | ICD-10-CM

## 2017-07-20 DIAGNOSIS — I519 Heart disease, unspecified: Secondary | ICD-10-CM

## 2017-07-20 DIAGNOSIS — S52591D Other fractures of lower end of right radius, subsequent encounter for closed fracture with routine healing: Secondary | ICD-10-CM

## 2017-07-20 DIAGNOSIS — F4323 Adjustment disorder with mixed anxiety and depressed mood: Secondary | ICD-10-CM | POA: Diagnosis not present

## 2017-07-20 MED ORDER — POTASSIUM CHLORIDE CRYS ER 20 MEQ PO TBCR: 20.0000 meq | EXTENDED_RELEASE_TABLET | Freq: Every day | ORAL | 1 refills | Status: DC | PRN

## 2017-07-20 MED ORDER — TOPIRAMATE 50 MG PO TABS: 50.0000 mg | ORAL_TABLET | Freq: Two times a day (BID) | ORAL | 1 refills | Status: DC

## 2017-07-20 MED ORDER — FUROSEMIDE 20 MG PO TABS: 20.0000 mg | ORAL_TABLET | Freq: Every day | ORAL | 1 refills | Status: DC | PRN

## 2017-07-20 MED FILL — FUROSEMIDE 20 MG TABS: 20 | 15 days supply | Qty: 30 | Fill #0

## 2017-07-20 MED FILL — TOPIRAMATE 50 MG TABLET: 50 | 90 days supply | Qty: 180 | Fill #0

## 2017-07-20 MED FILL — POTASSIUM CL ER 20 MEQ TABL: 20 | 15 days supply | Qty: 30 | Fill #0

## 2017-07-20 NOTE — Progress Notes (Signed)
Subjective:    Patient ID: [name], female    DOB: 12-02-1955, 61 y.o.   MRN: [id number]  07/20/2017  Panic Attack (follow-up ) and Fibromyalgia    HPI This 61 y.o. female presents for two month follow-up evaluation of panic attacks and fibromyalgia and palpitations.  Management changes made at last visit include: -tachycardia has improved with decreased dose of Cymbalta yet anxiety/depression improving.  Rx for Sertraline 50mg  daily.  Continue Cymbalta 60mg  daily. -major family stressor ongoing contributing to current emotional instability.  -has upcoming appointment with cardiology due to recent tachycardia due to comorbidities and further cardiac risk assessment.    RIGHT ulnar and radius fractures:  Got dizzy and lost balance; there was a stepdown and fell forward; fractured RIGHT ulnar and radius at wrist on 07/06/17. Hit glasses on cement. Had a bruise on R temple. Had a bruise on R lateral calf and R ribs.    Palpitations: S/p Holter monitor WNL; s/p echo that was WNL.  Removed stress from home; daughter and boyfriend and girls are gone.  Really misses granddaughters.  Swelling in feet B.  Wet audible wheeze after surgery.  Up every hour due to urination.   Chronic swelling but has persisted since surgery.  Grade I diastolic dysfunction on recent echo during cardiac work up.   Did have horrible swelling in fingers RIGHT.  Horrible bruising.   [facility] performed surgery.   BP Readings from Last 3 Encounters:  07/20/17 122/80  07/09/17 (!) 152/94  07/06/17 132/74   Wt Readings from Last 3 Encounters:  07/20/17 263 lb (119.3 kg)  07/09/17 260 lb (117.9 kg)  07/06/17 260 lb (117.9 kg)   Immunization History  Administered Date(s) Administered  . Influenza,inj,Quad PF,6+ Mos 05/28/2015, 05/21/2016, 04/21/2017  . Tdap 06/25/2015  . Zoster Recombinat (Shingrix) 02/09/2017, 04/21/2017    Review of Systems  Constitutional: Negative for chills, diaphoresis, fatigue  and fever.  Eyes: Negative for visual disturbance.  Respiratory: Negative for cough and shortness of breath.   Cardiovascular: Positive for leg swelling. Negative for chest pain and palpitations.  Gastrointestinal: Negative for abdominal pain, constipation, diarrhea, nausea and vomiting.  Endocrine: Negative for cold intolerance, heat intolerance, polydipsia, polyphagia and polyuria.  Musculoskeletal: Positive for arthralgias.  Neurological: Positive for dizziness, weakness and numbness. Negative for tremors, seizures, syncope, facial asymmetry, speech difficulty, light-headedness and headaches.  Psychiatric/Behavioral: Positive for dysphoric mood. The patient is nervous/anxious.     Past Medical History:  Diagnosis Date  . Allergy    [name], Flonase  . Anemia   . Anxiety   . Arthritis   . Chronic kidney disease   . Colon polyps   . Depression   . Fibromyalgia   . Gastritis   . GERD (gastroesophageal reflux disease)   . Hernia, hiatal   . HLD (hyperlipidemia)   . Hypertension   . Migraine   . Neuropathy   . Osteoporosis   . Pneumonia   . Sleep apnea with use of continuous positive airway pressure (CPAP)   . Thyroid goiter    Past Surgical History:  Procedure Laterality Date  . ABDOMINAL HYSTERECTOMY     ovaries intact; DUB.  No dysplasia.  Marland Kitchen JOINT REPLACEMENT    . OPEN REDUCTION INTERNAL FIXATION (ORIF) DISTAL RADIAL FRACTURE Right 07/09/2017   Procedure: RIGHT WRIST OPEN REDUCTION INTERNAL FIXATION (ORIF) DISTAL RADIAL FRACTURE AND REPAIR AS INDICATED;  Surgeon: Iran Planas, MD;  Location: [facility];  Service: Orthopedics;  Laterality: Right;  .  PARTIAL HYSTERECTOMY    . ROTATOR CUFF REPAIR Right 2009  . SHOULDER SURGERY Right    shoulder dislocation, fall, and elbow unla nerve  . SHOULDER SURGERY Right 2015   labral tear  . TUBAL LIGATION    . ULNAR NERVE REPAIR Right 08/19/2014   Allergies  Allergen Reactions  . Sulfa Antibiotics Itching   Current Outpatient  Medications on File Prior to Visit  Medication Sig Dispense Refill  . atorvastatin (LIPITOR) 10 MG tablet TAKE 1 TABLET (10 MG TOTAL) BY MOUTH DAILY AT 6 PM. 90 tablet 1  . butalbital-acetaminophen-caffeine (FIORICET, ESGIC) 50-325-40 MG tablet Take 1-2 tablets by mouth every 6 (six) hours as needed for headache. 40 tablet 0  . [employer] VITAMIN C 500 MG tablet Take 500 mg by mouth 2 (two) times daily.  0  . cyclobenzaprine (FLEXERIL) 10 MG tablet Take 0.5-1 tablets (5-10 mg total) by mouth 3 (three) times daily as needed for muscle spasms. 180 tablet 1  . darifenacin (ENABLEX) 15 MG 24 hr tablet TAKE 1 TABLET BY MOUTH ONCE DAILY 90 tablet 1  . dexlansoprazole (DEXILANT) 60 MG capsule Take 1 capsule (60 mg total) by mouth daily. 90 capsule 1  . docusate sodium (COLACE) 100 MG capsule TAKE 1 CAPSULE TWO TIMES DAILY  0  . DULoxetine (CYMBALTA) 60 MG capsule Take 2 capsules (120 mg total) by mouth daily. (Patient taking differently: Take 60 mg by mouth daily. ) 180 capsule 1  . gabapentin (NEURONTIN) 300 MG capsule Take 1-2 capsules (300-600 mg total) by mouth 4 (four) times daily. 540 capsule 1  . lisinopril (PRINIVIL,ZESTRIL) 20 MG tablet Take 1 tablet (20 mg total) by mouth daily. 90 tablet 1  . methocarbamol (ROBAXIN) 500 MG tablet TAKE 1 (ONE) TABLET EVERY SIX HOURS, AS NEEDED FOR SPASM  0  . ondansetron (ZOFRAN-ODT) 4 MG disintegrating tablet DISSOLVE 1 TABLET BY MOUTH EVERY 8 HOURS AS NEEDED FOR NAUSEA OR VOMITING. 20 tablet 4  . oxyCODONE-acetaminophen (PERCOCET/ROXICET) 5-325 MG tablet Take 1 tablet by mouth every 12 (twelve) hours as needed for moderate pain or severe pain. 30 tablet 0  . oxyCODONE-acetaminophen (PERCOCET/ROXICET) 5-325 MG tablet Take 1-2 tablets by mouth every 6 (six) hours as needed for moderate pain or severe pain. 15 tablet 0  . propranolol (INDERAL) 60 MG tablet Take 1 tablet (60 mg total) 3 (three) times daily by mouth. 270 tablet 3  . ranitidine (ZANTAC) 150 MG tablet Take 1  tablet (150 mg total) by mouth at bedtime. 90 tablet 1  . sertraline (ZOLOFT) 50 MG tablet Take 1 tablet (50 mg total) by mouth daily. 90 tablet 1   No current facility-administered medications on file prior to visit.    Social History   Socioeconomic History  . Marital status: Married    Spouse name: Not on file  . Number of children: 3  . Years of education: Not on file  . Highest education level: Not on file  Social Needs  . Financial resource strain: Not on file  . Food insecurity - worry: Not on file  . Food insecurity - inability: Not on file  . Transportation needs - medical: Not on file  . Transportation needs - non-medical: Not on file  Occupational History  . Occupation: unemployed  Tobacco Use  . Smoking status: Former Smoker    Last attempt to quit: 08/04/1998    Years since quitting: 19.0  . Smokeless tobacco: Never Used  Substance and Sexual Activity  . Alcohol  use: No  . Drug use: No  . Sexual activity: No    Birth control/protection: Surgical  Other Topics Concern  . Not on file  Social History Narrative   Marital status: married x 19 years; moderately happy     Children: 3 children (Ackerman, Gilmer, 39 April); 8 grandchildren; 0 gg     Lives: with husband/Steve, April, 2 granddaughters     Employment:  Unemployed; quit working 2015 with work related injury; awaiting disability in 2018; disability approved in 02/2017.       Tobacco: quit smoking in 2016; smoked x 2 years.      Alcohol: none      Drugs: none      Exercise: rarely in 2018         Family History  Problem Relation Age of Onset  . Heart disease Mother 32       AMI/CAD/CHF as cause of death  . Hypertension Mother   . Hyperlipidemia Mother   . Hypothyroidism Mother   . Depression Mother   . Gout Mother   . Heart disease Father 31       AMI  . Hypertension Father   . Stroke Father 66       mild CVA  . Prostate cancer Father   . Hyperlipidemia Father   . Cancer Father 11        prostate cancer  . Hypertension Brother   . Hyperlipidemia Brother   . Cancer Brother        prostate cancer  . Cancer Maternal Grandmother        type unknown  . Cancer Maternal Grandfather        type unknown  . Heart disease Paternal Grandmother   . Heart defect Paternal Grandfather   . Melanoma Brother   . Hyperlipidemia Brother   . Hypertension Brother   . Cancer Brother        melanoma  . Hypertension Brother   . Hyperlipidemia Brother   . Melanoma Brother   . Cancer Brother 25       melanoma  . Cancer Sister        Basal cell carcinoma scalp  . Depression Sister   . Hypertension Brother   . Depression Brother   . Hypertension Brother   . Gout Brother   . Arthritis Sister   . Depression Sister        Objective:    BP 122/80   Pulse 87   Temp 98.1 F (36.7 C) (Oral)   Resp 16   Ht 5' 5.16" (1.655 m)   Wt 263 lb (119.3 kg)   SpO2 93%   BMI 43.55 kg/m  Physical Exam  Constitutional: She is oriented to person, place, and time. She appears well-developed and well-nourished. No distress.  HENT:  Head: Normocephalic and atraumatic.  Right Ear: External ear normal.  Left Ear: External ear normal.  Nose: Nose normal.  Mouth/Throat: Oropharynx is clear and moist.  Eyes: Conjunctivae and EOM are normal. Pupils are equal, round, and reactive to light.  Neck: Normal range of motion. Neck supple. Carotid bruit is not present. No thyromegaly present.  Cardiovascular: Normal rate, regular rhythm, normal heart sounds and intact distal pulses. Exam reveals no gallop and no friction rub.  No murmur heard. Trace to 1+ edema B hands and extremities.  Pulmonary/Chest: Effort normal and breath sounds normal. She has no wheezes. She has no rales.  Abdominal: Soft. Bowel sounds are normal. She  exhibits no distension and no mass. There is no tenderness. There is no rebound and no guarding.  Musculoskeletal: She exhibits edema.  Lymphadenopathy:    She has no cervical  adenopathy.  Neurological: She is alert and oriented to person, place, and time. No cranial nerve deficit.  Skin: Skin is warm and dry. No rash noted. She is not diaphoretic. No erythema. No pallor.  Psychiatric: She has a normal mood and affect. Her behavior is normal.   No results found. Depression screen [facility] 2/9 07/20/2017 05/25/2017 05/13/2017 04/21/2017 01/09/2017  Decreased Interest 0 1 0 1 3  Down, Depressed, Hopeless 0 1 0 1 3  PHQ - 2 Score 0 2 0 2 6  Altered sleeping - 1 - 1 3  Tired, decreased energy - 1 - 1 3  Change in appetite - 0 - 0 2  Feeling bad or failure about yourself  - 0 - 1 3  Trouble concentrating - 0 - 0 3  Moving slowly or fidgety/restless - 0 - 0 3  Suicidal thoughts - 0 - 0 0  PHQ-9 Score - 4 - 5 23  Difficult doing work/chores - - - - Somewhat difficult  Some recent data might be hidden   Fall Risk  07/20/2017 05/25/2017 05/13/2017 04/21/2017 02/17/2017  Falls in the past year? Yes No No No Yes  Number falls in past yr: 1 - - - -  Injury with Fall? Yes - - - -  Comment - - - - -        Assessment & Plan:   1. Leg swelling   2. Grade I diastolic dysfunction   3. Adjustment disorder with mixed anxiety and depressed mood   4. Tachycardia   5. Shortness of breath on exertion   6. Other closed fracture of distal end of right radius with routine healing, subsequent encounter   7. Migraine without aura and without status migrainosus, not intractable     Suffered recent right distal radial and ulnar fractures requiring surgical revision.  Doing well postoperatively.  Since hospital discharge for several surgical revision right arm fractures, suffering with swelling.  Likely overhydration during the operative procedure.  Prescription for Lasix 20 mg 1-2 daily as needed.  Prescription for potassium chloride also provided.  Status post recent cardiac evaluation including echo.  Grade 1 diastolic dysfunction appreciated on echo.  Doing better emotionally  since daughter and her family have moved out of the home.  Tolerating Zoloft in addition to Cymbalta therapy.  No changes to management at this time.  Tachycardia has improved with decreased Cymbalta dose.  Dyspnea on exertion continues yet no cardiac etiology to symptoms.  Most consistent with deconditioning and obesity.  Recommend weight loss, exercise, low-sodium diet.  Refill of Topamax provided for migraine prevention therapy.  Orders Placed This Encounter  Procedures  . CBC with Differential/Platelet  . Comprehensive metabolic panel   Meds ordered this encounter  Medications  . furosemide (LASIX) 20 MG tablet    Sig: Take 1-2 tablets (20-40 mg total) by mouth daily as needed.    Dispense:  30 tablet    Refill:  1  . potassium chloride SA (K-DUR,KLOR-CON) 20 MEQ tablet    Sig: Take 1-2 tablets (20-40 mEq total) by mouth daily as needed.    Dispense:  30 tablet    Refill:  1  . topiramate (TOPAMAX) 50 MG tablet    Sig: Take 1 tablet (50 mg total) by mouth 2 (two) times daily.  Dispense:  180 tablet    Refill:  1    Return in about 3 months (around 10/18/2017) for follow-up chronic medical conditions.   Leven Hoel Elayne Guerin, M.D. Primary Care at [facility] [address] [place], [place]  [address] [phone] phone [phone] fax

## 2017-07-20 NOTE — Patient Instructions (Addendum)
  Call on Thursday if you are not breathing better or if swelling is not better.   IF you received an x-ray today, you will receive an invoice from [facility]. Please contact [facility] at [phone] with questions or concerns regarding your invoice.   IF you received labwork today, you will receive an invoice from [facility]. Please contact [facility] at [phone] with questions or concerns regarding your invoice.   Our billing staff will not be able to assist you with questions regarding bills from these companies.  You will be contacted with the lab results as soon as they are available. The fastest way to get your results is to activate your My Chart account. Instructions are located on the last page of this paperwork. If you have not heard from [place] regarding the results in 2 weeks, please contact this office.

## 2017-07-21 LAB — CBC WITH DIFFERENTIAL/PLATELET
BASOS ABS: 0 10*3/uL (ref 0.0–0.2)
BASOS: 0 %
EOS (ABSOLUTE): 0.1 10*3/uL (ref 0.0–0.4)
Eos: 1 %
Hematocrit: 34.5 % (ref 34.0–46.6)
Hemoglobin: 11.3 g/dL (ref 11.1–15.9)
IMMATURE GRANS (ABS): 0 10*3/uL (ref 0.0–0.1)
IMMATURE GRANULOCYTES: 0 %
LYMPHS: 19 %
Lymphocytes Absolute: 1.2 10*3/uL (ref 0.7–3.1)
MCH: 27 pg (ref 26.6–33.0)
MCHC: 32.8 g/dL (ref 31.5–35.7)
MCV: 83 fL (ref 79–97)
Monocytes Absolute: 0.5 10*3/uL (ref 0.1–0.9)
Monocytes: 7 %
NEUTROS PCT: 73 %
Neutrophils Absolute: 4.7 10*3/uL (ref 1.4–7.0)
PLATELETS: 287 10*3/uL (ref 150–379)
RBC: 4.18 x10E6/uL (ref 3.77–5.28)
RDW: 15 % (ref 12.3–15.4)
WBC: 6.4 10*3/uL (ref 3.4–10.8)

## 2017-07-21 LAB — COMPREHENSIVE METABOLIC PANEL
ALT: 13 IU/L (ref 0–32)
AST: 17 IU/L (ref 0–40)
Albumin/Globulin Ratio: 1.7 (ref 1.2–2.2)
Albumin: 4.2 g/dL (ref 3.6–4.8)
Alkaline Phosphatase: 115 IU/L (ref 39–117)
BUN/Creatinine Ratio: 12 (ref 12–28)
BUN: 10 mg/dL (ref 8–27)
Bilirubin Total: <0.2 mg/dL (ref 0.0–1.2)
CHLORIDE: 108 mmol/L — AB (ref 96–106)
CO2: 21 mmol/L (ref 20–29)
Calcium: 9.1 mg/dL (ref 8.7–10.3)
Creatinine, Ser: 0.82 mg/dL (ref 0.57–1.00)
GFR, EST AFRICAN AMERICAN: 89 mL/min/{1.73_m2} (ref >59)
GFR, EST NON AFRICAN AMERICAN: 77 mL/min/{1.73_m2} (ref >59)
GLUCOSE: 96 mg/dL (ref 65–99)
Globulin, Total: 2.5 g/dL (ref 1.5–4.5)
Potassium: 4.5 mmol/L (ref 3.5–5.2)
Sodium: 143 mmol/L (ref 134–144)
TOTAL PROTEIN: 6.7 g/dL (ref 6.0–8.5)

## 2017-07-23 DIAGNOSIS — S52591D Other fractures of lower end of right radius, subsequent encounter for closed fracture with routine healing: Secondary | ICD-10-CM | POA: Diagnosis not present

## 2017-07-23 MED FILL — METHOCARBAMOL 500 MG TABS: 500 | 7 days supply | Qty: 30 | Fill #0

## 2017-07-23 MED FILL — OXYCODONE-ACETAMINOPHEN 5-3: 5-325 | 7 days supply | Qty: 28 | Fill #0

## 2017-07-30 ENCOUNTER — Encounter: Payer: Self-pay | Admitting: *Deleted

## 2017-08-06 DIAGNOSIS — R29898 Other symptoms and signs involving the musculoskeletal system: Secondary | ICD-10-CM | POA: Diagnosis not present

## 2017-08-06 DIAGNOSIS — M25531 Pain in right wrist: Secondary | ICD-10-CM | POA: Diagnosis not present

## 2017-08-06 DIAGNOSIS — S52561D Barton's fracture of right radius, subsequent encounter for closed fracture with routine healing: Secondary | ICD-10-CM | POA: Diagnosis not present

## 2017-08-11 DIAGNOSIS — M25631 Stiffness of right wrist, not elsewhere classified: Secondary | ICD-10-CM | POA: Diagnosis not present

## 2017-08-12 ENCOUNTER — Other Ambulatory Visit: Payer: Self-pay | Admitting: Family Medicine

## 2017-08-12 MED FILL — DEXILANT DR 60 MG CAPSULE: 60 | 90 days supply | Qty: 90 | Fill #0

## 2017-08-12 MED FILL — ONDANSETRON ODT 4 MG TABLET: 4 | 7 days supply | Qty: 20 | Fill #4

## 2017-08-12 MED FILL — raNITIdine HCL 150 MG TABS: 150 | 90 days supply | Qty: 90 | Fill #1

## 2017-08-12 MED FILL — DULoxetine HCL 60 MG CPEP: 60 | 90 days supply | Qty: 180 | Fill #1

## 2017-08-12 MED FILL — LISINOPRIL 20 MG TABLET: 20 | 90 days supply | Qty: 90 | Fill #0

## 2017-08-12 MED FILL — PROPRANOLOL 60 MG TABLET: 60 | 90 days supply | Qty: 270 | Fill #0

## 2017-08-13 DIAGNOSIS — M25631 Stiffness of right wrist, not elsewhere classified: Secondary | ICD-10-CM | POA: Diagnosis not present

## 2017-08-14 ENCOUNTER — Ambulatory Visit (INDEPENDENT_AMBULATORY_CARE_PROVIDER_SITE_OTHER): Payer: 59 | Admitting: Cardiology

## 2017-08-14 ENCOUNTER — Encounter: Payer: Self-pay | Admitting: Cardiology

## 2017-08-14 DIAGNOSIS — I10 Essential (primary) hypertension: Secondary | ICD-10-CM | POA: Diagnosis not present

## 2017-08-14 DIAGNOSIS — E7800 Pure hypercholesterolemia, unspecified: Secondary | ICD-10-CM

## 2017-08-14 DIAGNOSIS — R0789 Other chest pain: Secondary | ICD-10-CM

## 2017-08-14 DIAGNOSIS — R002 Palpitations: Secondary | ICD-10-CM | POA: Diagnosis not present

## 2017-08-14 DIAGNOSIS — R0602 Shortness of breath: Secondary | ICD-10-CM

## 2017-08-14 NOTE — Patient Instructions (Signed)
NO CHANGES WITH TREATMENT      Your physician recommends that you schedule a follow-up appointment on an as needed basis.

## 2017-08-14 NOTE — Progress Notes (Signed)
PCP: Wardell Honour, MD  Clinic Note: Chief Complaint  Patient presents with  . Follow-up    follow up from ECHO and holter monirto results   . Palpitations    HPI: [name] is a 62 y.o. female with a PMH below who presents today for 62-month follow-up to discuss results of her Holter monitor and a.  She was initially seen for tachycardia back in November..  Recent Hospitalizations:   07/06/2017 -- fall with wrist fracture, wrist Sgx.   Had a dizzy spell & lost balance.  NO LOC/.  Studies Personally Reviewed - (if available, images/films reviewed: From Epic Chart or Care Everywhere)  48-hour monitor: November 21-23, 2018.  Mostly normal sinus rhythm: Average heart rate 71 bpm  Minimum heart rate 54 bpm (sinus bradycardia). Maximal heart rate 114 bpm (sinus tachycardia).  Only 1 PVC noted. Rare PACs noted (less than 1%, 94 total) -mostly singlets.  1 run of PAT (8 beats) otherwise no significant arrhythmias noted.  No atrial fibrillation, atrial flutter, SVT, VT  2D Echocardiogram June 24, 2017: EF 55-60%.  grade 1 diastolic dysfunction.  Otherwise normal  Interval History: [name] returns today for follow-up overall doing fairly well. She had an episode where she felt somewhat dizzy and lost her balance then fell forward and broke her wrist. She didn't pass out, didn't necessarily notice any irregular heartbeats or palpitations that time either.  She still has intermittent shortness of breath with either rest or exertion, but nothing really different. She has occasional skipping beats, but nothing overly worrisome for prolonged period nothing more than a few seconds. No syncope or near-syncope, but she does get somewhat lightheaded on occasion (occasionally positional.  No further episodes of chest pain with rest or exertion. Remainder of cardiac review of symptoms: No PND, orthopnea or edema.   No TIA/amaurosis fugax symptoms. No claudication.  ROS: A brief ROS was  performed. Review of Systems  Constitutional: Negative for malaise/fatigue.  HENT: Negative for congestion and nosebleeds.   Gastrointestinal: Negative for blood in stool and melena.  Genitourinary: Negative for hematuria.  Musculoskeletal: Positive for falls (As noted above) and joint pain (Wrist pain from her fracture).  Neurological: Positive for dizziness. Negative for weakness.    I have reviewed and (if needed) personally updated the patient's problem list, medications, allergies, past medical and surgical history, social and family history.   Past Medical History:  Diagnosis Date  . Allergy    [name], Flonase  . Anemia   . Anxiety   . Arthritis   . Chronic kidney disease   . Colon polyps   . Depression   . Fibromyalgia   . Gastritis   . GERD (gastroesophageal reflux disease)   . Hernia, hiatal   . HLD (hyperlipidemia)   . Hypertension   . Migraine   . Neuropathy   . Osteoporosis   . Pneumonia   . Sleep apnea with use of continuous positive airway pressure (CPAP)   . Thyroid goiter     Past Surgical History:  Procedure Laterality Date  . ABDOMINAL HYSTERECTOMY     ovaries intact; DUB.  No dysplasia.  Marland Kitchen JOINT REPLACEMENT    . OPEN REDUCTION INTERNAL FIXATION (ORIF) DISTAL RADIAL FRACTURE Right 07/09/2017   Procedure: RIGHT WRIST OPEN REDUCTION INTERNAL FIXATION (ORIF) DISTAL RADIAL FRACTURE AND REPAIR AS INDICATED;  Surgeon: Iran Planas, MD;  Location: [facility];  Service: Orthopedics;  Laterality: Right;  . PARTIAL HYSTERECTOMY    . ROTATOR CUFF  REPAIR Right 2009  . SHOULDER SURGERY Right    shoulder dislocation, fall, and elbow unla nerve  . SHOULDER SURGERY Right 2015   labral tear  . TUBAL LIGATION    . ULNAR NERVE REPAIR Right 08/19/2014    Current Meds  Medication Sig  . atorvastatin (LIPITOR) 10 MG tablet TAKE 1 TABLET (10 MG TOTAL) BY MOUTH DAILY AT 6 PM.  . butalbital-acetaminophen-caffeine (FIORICET, ESGIC) 50-325-40 MG tablet Take 1-2 tablets by  mouth every 6 (six) hours as needed for headache.  . cyclobenzaprine (FLEXERIL) 10 MG tablet Take 0.5-1 tablets (5-10 mg total) by mouth 3 (three) times daily as needed for muscle spasms.  Marland Kitchen darifenacin (ENABLEX) 15 MG 24 hr tablet TAKE 1 TABLET BY MOUTH ONCE DAILY  . DEXILANT 60 MG capsule TAKE 1 CAPSULE (60 MG TOTAL) BY MOUTH DAILY.  Marland Kitchen docusate sodium (COLACE) 100 MG capsule TAKE 1 CAPSULE TWO TIMES DAILY  . DULoxetine (CYMBALTA) 60 MG capsule Take 2 capsules (120 mg total) by mouth daily. (Patient taking differently: Take 60 mg by mouth daily. )  . furosemide (LASIX) 20 MG tablet Take 1-2 tablets (20-40 mg total) by mouth daily as needed.  . gabapentin (NEURONTIN) 300 MG capsule Take 1-2 capsules (300-600 mg total) by mouth 4 (four) times daily.  Marland Kitchen lisinopril (PRINIVIL,ZESTRIL) 20 MG tablet TAKE 1 TABLET (20 MG TOTAL) BY MOUTH DAILY.  Marland Kitchen ondansetron (ZOFRAN-ODT) 4 MG disintegrating tablet DISSOLVE 1 TABLET BY MOUTH EVERY 8 HOURS AS NEEDED FOR NAUSEA OR VOMITING.  Marland Kitchen oxyCODONE-acetaminophen (PERCOCET/ROXICET) 5-325 MG tablet Take 1 tablet by mouth every 12 (twelve) hours.  . potassium chloride SA (K-DUR,KLOR-CON) 20 MEQ tablet Take 1-2 tablets (20-40 mEq total) by mouth daily as needed.  . propranolol (INDERAL) 60 MG tablet Take 1 tablet (60 mg total) 3 (three) times daily by mouth.  . ranitidine (ZANTAC) 150 MG tablet Take 1 tablet (150 mg total) by mouth at bedtime.  . sertraline (ZOLOFT) 50 MG tablet Take 1 tablet (50 mg total) by mouth daily.  Marland Kitchen topiramate (TOPAMAX) 50 MG tablet Take 1 tablet (50 mg total) by mouth 2 (two) times daily.    Allergies  Allergen Reactions  . Sulfa Antibiotics Itching    Social History   Tobacco Use  . Smoking status: Former Smoker    Last attempt to quit: 08/04/1998    Years since quitting: 19.0  . Smokeless tobacco: Never Used  Substance Use Topics  . Alcohol use: No  . Drug use: No   Social History   Social History Narrative   Marital status:  married x 19 years; moderately happy     Children: 3 children ([name], [name], 39 April); 8 grandchildren; 0 gg     Lives: with husband/Steve, April, 2 granddaughters     Employment:  Unemployed; quit working 2015 with work related injury; awaiting disability in 2018; disability approved in 02/2017.       Tobacco: quit smoking in 2016; smoked x 2 years.      Alcohol: none      Drugs: none      Exercise: rarely in 2018          family history includes Arthritis in her sister; Cancer in her brother, brother, maternal grandfather, maternal grandmother, and sister; Cancer (age of onset: 53) in her brother; Cancer (age of onset: 44) in her father; Depression in her brother, mother, sister, and sister; Gout in her brother and mother; Heart defect in her paternal grandfather; Heart  disease in her paternal grandmother; Heart disease (age of onset: 41) in her father; Heart disease (age of onset: 13) in her mother; Hyperlipidemia in her brother, brother, brother, father, and mother; Hypertension in her brother, brother, brother, brother, brother, father, and mother; Hypothyroidism in her mother; Melanoma in her brother and brother; Prostate cancer in her father; Stroke (age of onset: 37) in her father.  Wt Readings from Last 3 Encounters:  08/14/17 262 lb (118.8 kg)  07/20/17 263 lb (119.3 kg)  07/09/17 260 lb (117.9 kg)    PHYSICAL EXAM BP 108/80   Pulse 60   Ht 5\' 4"  (1.626 m)   Wt 262 lb (118.8 kg)   BMI 44.97 kg/m  Physical Exam  Constitutional: She is oriented to person, place, and time. She appears well-developed and well-nourished. No distress.  Morbidly obese. Well groomed  HENT:  Head: Normocephalic and atraumatic.  Neck: No JVD present. Carotid bruit is not present.  Cardiovascular: Normal rate, regular rhythm and normal pulses.  No extrasystoles are present. PMI is not displaced (Unable to palpate). Exam reveals distant heart sounds. Exam reveals no gallop and no friction rub.    Pulmonary/Chest: Effort normal. No respiratory distress. She has no wheezes. She has no rales.  Mild diffuse interstitial sounds. Otherwise clear  Musculoskeletal: Normal range of motion. She exhibits edema (Trivial).  Neurological: She is alert and oriented to person, place, and time.  Psychiatric: She has a normal mood and affect. Her behavior is normal. Judgment and thought content normal.  Nursing note and vitals reviewed.    Adult ECG Report  None  Other studies Reviewed: Additional studies/ records that were reviewed today include:  Recent Labs:   Lab Results  Component Value Date   CREATININE 0.82 07/20/2017   BUN 10 07/20/2017   NA 143 07/20/2017   K 4.5 07/20/2017   CL 108 (H) 07/20/2017   CO2 21 07/20/2017   Lab Results  Component Value Date   CHOL 172 01/09/2017   HDL 59 01/09/2017   LDLCALC 93 01/09/2017   TRIG 102 01/09/2017   CHOLHDL 2.9 01/09/2017      ASSESSMENT / PLAN: Problem List Items Addressed This Visit    Chest wall pain    Not likely to be cardiac-- very reproducible on exam      Essential hypertension (Chronic)    Stable on current meds      Pure hypercholesterolemia (Chronic)    Pretty much at goal for current risk factors. Continue low-dose atorvastatin      Rapid palpitations (Chronic)    Thankfully, her monitor did not show any signs of significant tachycardia. Only short bursts of PAT that may be symptomatic. She is already on propranolol which I would not further titrate.  Essentially normal echo  Avoid triggers such as caffeine, sugar or chocolate as well as significant stress.      Shortness of breath on exertion    Normal echo.  No other symptoms to suggest coronary ischemia. If symptoms persist, we could consider stress test in the future. Otherwise I think she can follow-up as needed.         Current medicines are reviewed at length with the patient today. (+/- concerns) n/a The following changes have been made:  n/a  Patient Instructions  NO CHANGES WITH TREATMENT      Your physician recommends that you schedule a follow-up appointment on an as needed basis.    Studies Ordered:   No orders of the  defined types were placed in this encounter.     Glenetta Hew, M.D., M.S. Interventional Cardiologist   Pager # [phone] Phone # [phone] [address] [place],  [address]   Thank you for choosing [facility] at [facility]!!

## 2017-08-17 ENCOUNTER — Encounter: Payer: Self-pay | Admitting: Cardiology

## 2017-08-17 NOTE — Assessment & Plan Note (Signed)
Stable on current meds 

## 2017-08-17 NOTE — Assessment & Plan Note (Addendum)
Thankfully, her monitor did not show any signs of significant tachycardia. Only short bursts of PAT that may be symptomatic. She is already on propranolol which I would not further titrate.  Essentially normal echo  Avoid triggers such as caffeine, sugar or chocolate as well as significant stress.

## 2017-08-17 NOTE — Assessment & Plan Note (Signed)
Not likely to be cardiac-- very reproducible on exam

## 2017-08-17 NOTE — Assessment & Plan Note (Signed)
Normal echo.  No other symptoms to suggest coronary ischemia. If symptoms persist, we could consider stress test in the future. Otherwise I think she can follow-up as needed.

## 2017-08-17 NOTE — Assessment & Plan Note (Signed)
Pretty much at goal for current risk factors. Continue low-dose atorvastatin

## 2017-08-18 DIAGNOSIS — M25631 Stiffness of right wrist, not elsewhere classified: Secondary | ICD-10-CM | POA: Diagnosis not present

## 2017-08-20 DIAGNOSIS — M25631 Stiffness of right wrist, not elsewhere classified: Secondary | ICD-10-CM | POA: Diagnosis not present

## 2017-08-25 DIAGNOSIS — M25631 Stiffness of right wrist, not elsewhere classified: Secondary | ICD-10-CM | POA: Diagnosis not present

## 2017-08-27 DIAGNOSIS — M25631 Stiffness of right wrist, not elsewhere classified: Secondary | ICD-10-CM | POA: Diagnosis not present

## 2017-08-31 ENCOUNTER — Other Ambulatory Visit: Payer: Self-pay | Admitting: Family Medicine

## 2017-08-31 MED FILL — SERTRALINE HCL 50 MG TABLET: 50 | 90 days supply | Qty: 90 | Fill #1

## 2017-08-31 NOTE — Telephone Encounter (Signed)
See refill request from pharmacy for Atorvastatin and Darifenacin ER; last office visit was 07/20/17 for leg swelling, panic attacks, and Fibromyalgia; last OV that Hyperlipidemia addressed was 01/09/17; last OV that urinary frequency was addressed was 06/2015.  Next appt. is 10/2017

## 2017-09-01 DIAGNOSIS — M25631 Stiffness of right wrist, not elsewhere classified: Secondary | ICD-10-CM | POA: Diagnosis not present

## 2017-09-01 MED FILL — DARIFENACIN ER 15 MG TABLET: 15 | 60 days supply | Qty: 60 | Fill #0

## 2017-09-01 MED FILL — ATORVASTATIN 10 MG TABLET: 10 | 60 days supply | Qty: 60 | Fill #0

## 2017-09-03 DIAGNOSIS — S52561D Barton's fracture of right radius, subsequent encounter for closed fracture with routine healing: Secondary | ICD-10-CM | POA: Diagnosis not present

## 2017-09-03 DIAGNOSIS — M25631 Stiffness of right wrist, not elsewhere classified: Secondary | ICD-10-CM | POA: Diagnosis not present

## 2017-09-10 DIAGNOSIS — M25631 Stiffness of right wrist, not elsewhere classified: Secondary | ICD-10-CM | POA: Diagnosis not present

## 2017-09-15 DIAGNOSIS — M25631 Stiffness of right wrist, not elsewhere classified: Secondary | ICD-10-CM | POA: Diagnosis not present

## 2017-09-17 DIAGNOSIS — M25631 Stiffness of right wrist, not elsewhere classified: Secondary | ICD-10-CM | POA: Diagnosis not present

## 2017-09-18 ENCOUNTER — Other Ambulatory Visit: Payer: Self-pay | Admitting: Family Medicine

## 2017-09-18 MED FILL — ONDANSETRON ODT 4 MG TABLET: 4 | 7 days supply | Qty: 20 | Fill #0

## 2017-09-18 MED FILL — CYCLOBENZAPRINE 10 MG TAB: 10 | 60 days supply | Qty: 180 | Fill #1

## 2017-09-18 NOTE — Telephone Encounter (Signed)
Zofran-ODT 4 mg tab  [place],    [address].

## 2017-09-22 DIAGNOSIS — M25631 Stiffness of right wrist, not elsewhere classified: Secondary | ICD-10-CM | POA: Diagnosis not present

## 2017-09-24 DIAGNOSIS — M25631 Stiffness of right wrist, not elsewhere classified: Secondary | ICD-10-CM | POA: Diagnosis not present

## 2017-09-29 DIAGNOSIS — M25631 Stiffness of right wrist, not elsewhere classified: Secondary | ICD-10-CM | POA: Diagnosis not present

## 2017-10-01 DIAGNOSIS — M25631 Stiffness of right wrist, not elsewhere classified: Secondary | ICD-10-CM | POA: Diagnosis not present

## 2017-10-06 DIAGNOSIS — M25631 Stiffness of right wrist, not elsewhere classified: Secondary | ICD-10-CM | POA: Diagnosis not present

## 2017-10-08 DIAGNOSIS — M25631 Stiffness of right wrist, not elsewhere classified: Secondary | ICD-10-CM | POA: Diagnosis not present

## 2017-10-13 DIAGNOSIS — M25631 Stiffness of right wrist, not elsewhere classified: Secondary | ICD-10-CM | POA: Diagnosis not present

## 2017-10-15 DIAGNOSIS — S52561D Barton's fracture of right radius, subsequent encounter for closed fracture with routine healing: Secondary | ICD-10-CM | POA: Diagnosis not present

## 2017-10-15 DIAGNOSIS — M25631 Stiffness of right wrist, not elsewhere classified: Secondary | ICD-10-CM | POA: Diagnosis not present

## 2017-10-15 IMAGING — US US RENAL
1 series · 14 of 25 positions shown · non-contrast
Comparison: None.

CLINICAL DATA: Stage III renal disease.

EXAM:
RENAL / URINARY TRACT ULTRASOUND COMPLETE

[Series 1: us renal · 0.32mm/px · 14 of 32 slices shown]
[im 1/32]
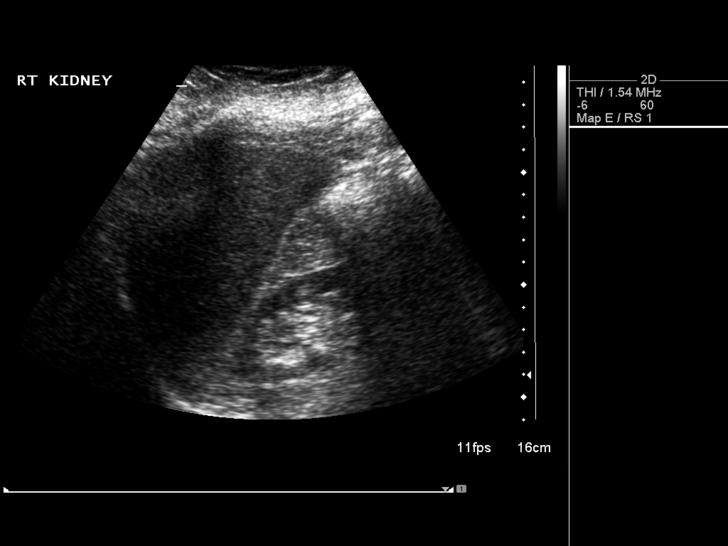
[im 3/32]
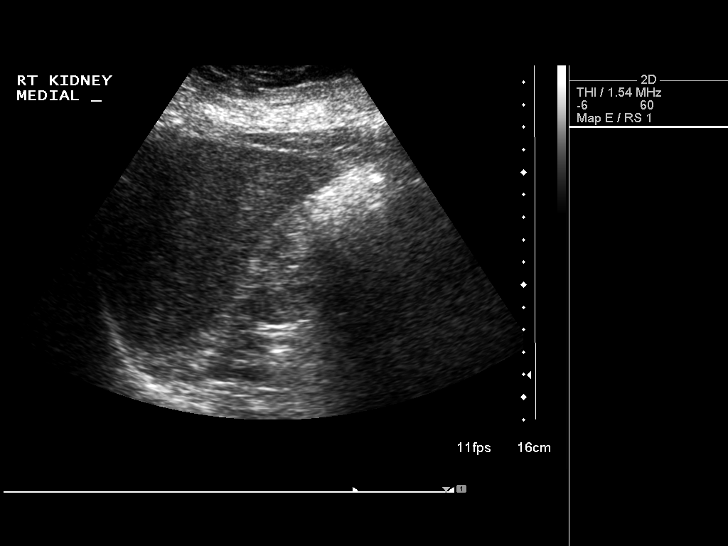
[im 6/32]
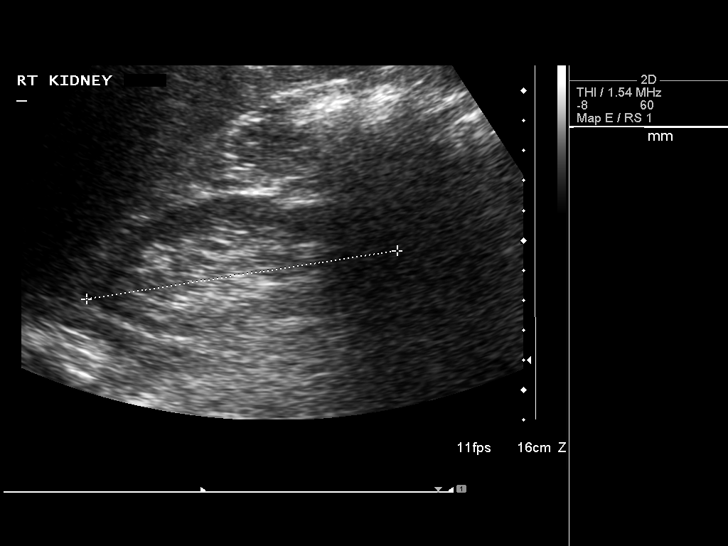
[im 8/32]
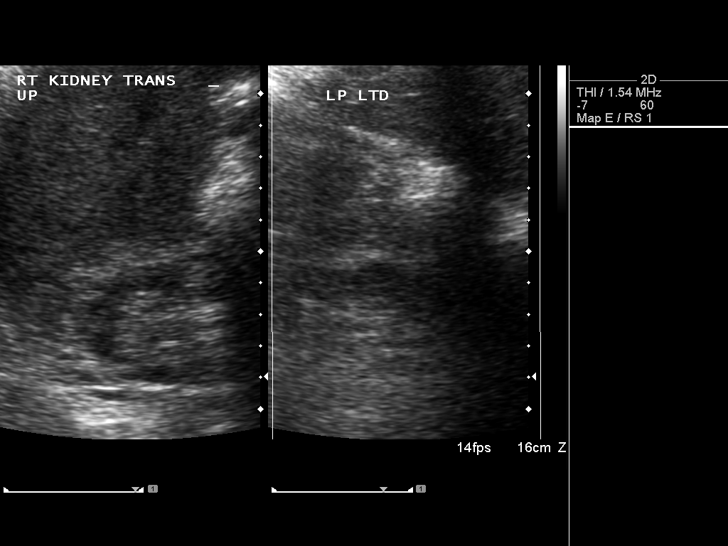
[im 11/32]
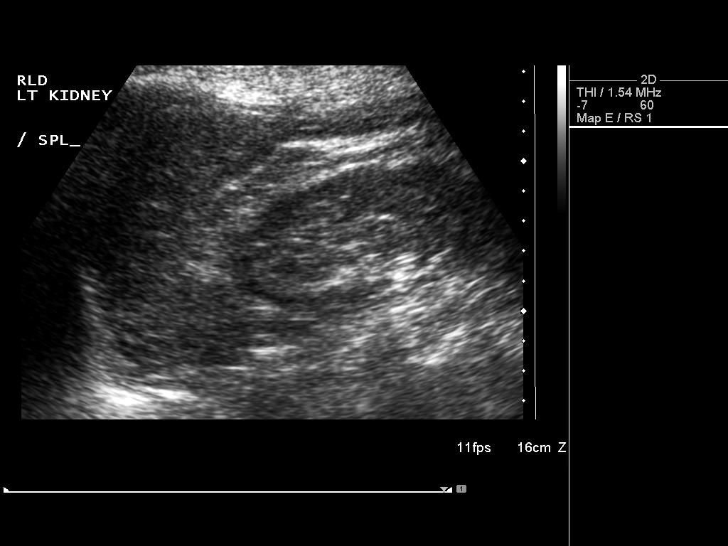
[im 12/32]
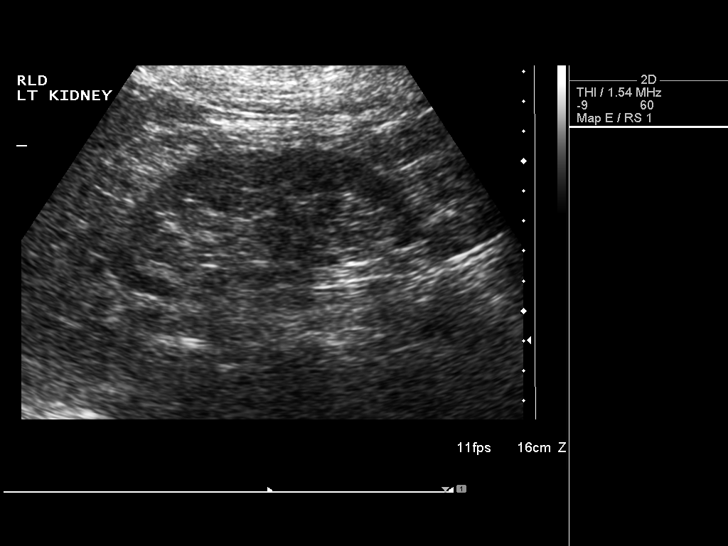
[im 15/32]
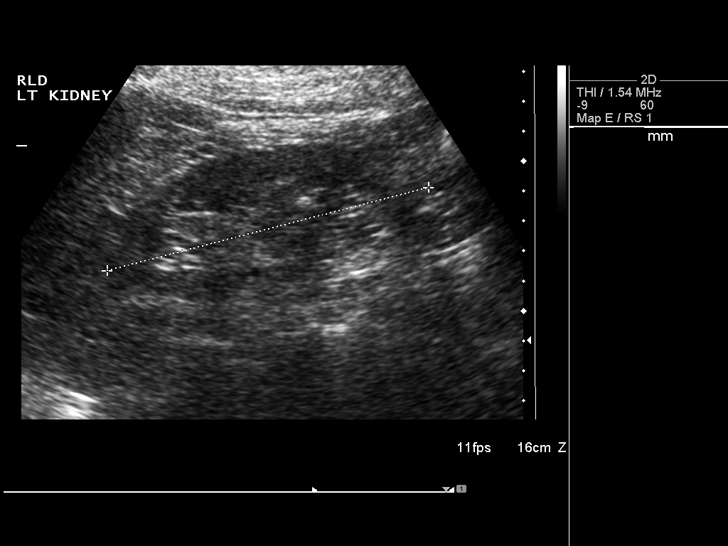
[im 17/32]
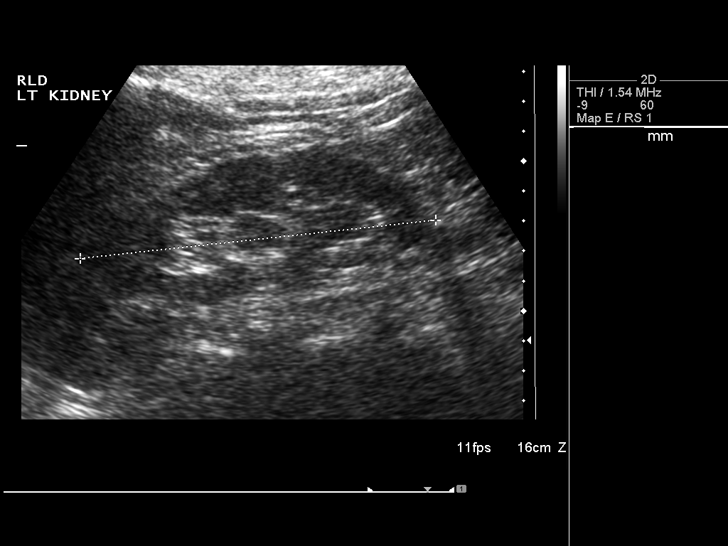
[im 20/32]
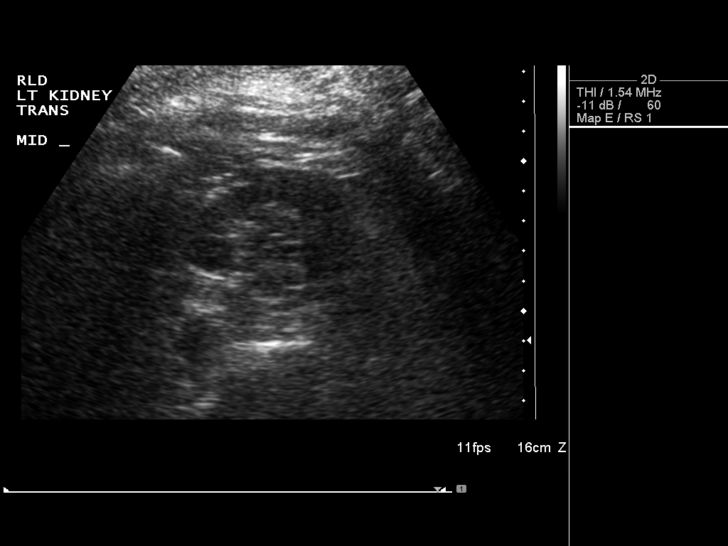
[im 21/32]
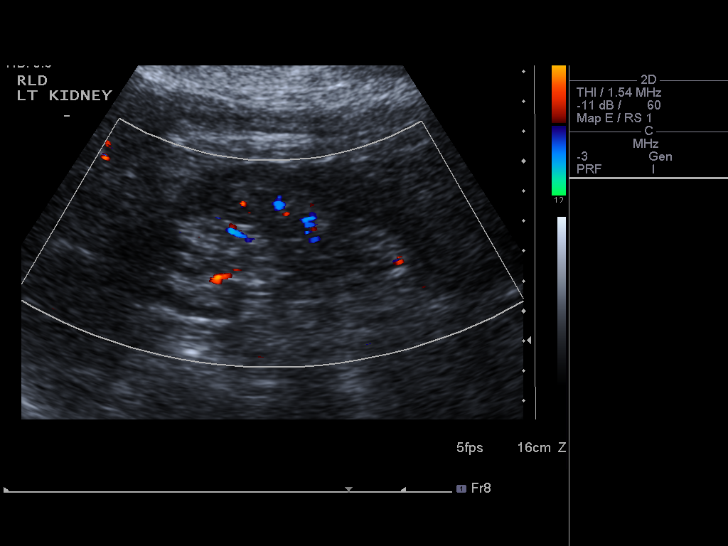
[im 24/32]
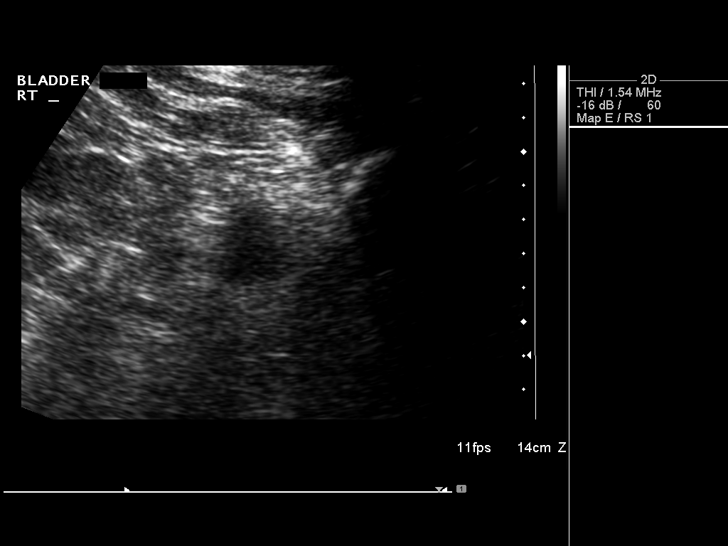
[im 26/32]
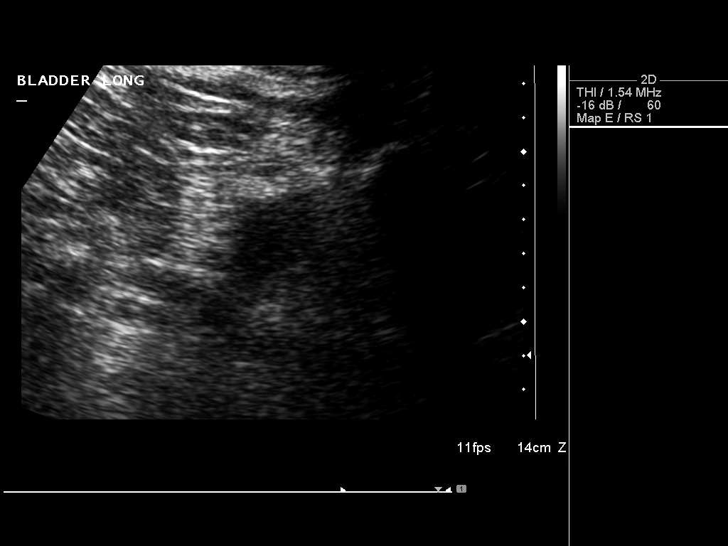
[im 29/32]
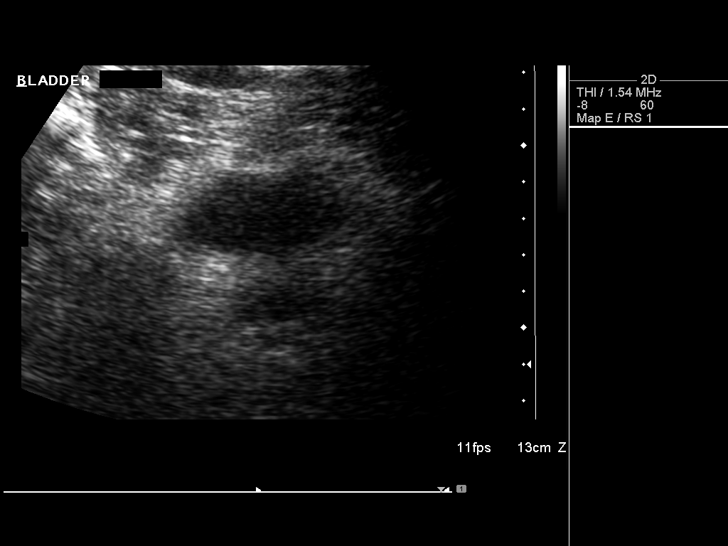
[im 32/32]
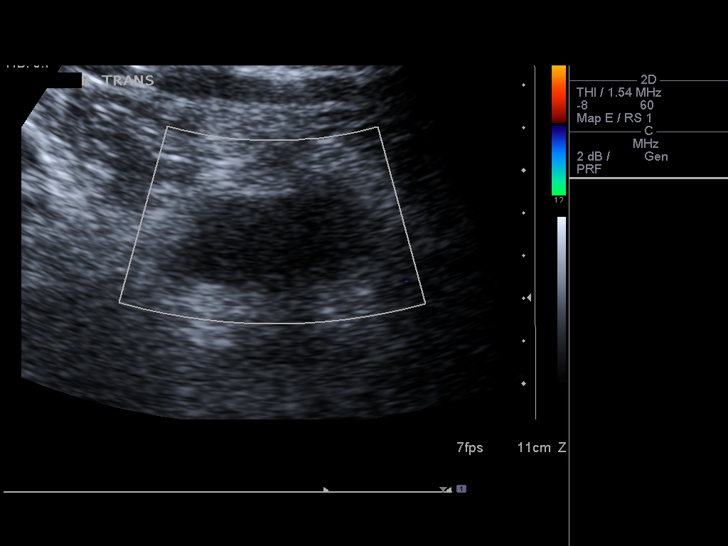

[14 of 25 positions shown; findings below may reference images not displayed]

FINDINGS: Right Kidney:

Length: 10.5. Echogenicity within normal limits. No mass or
hydronephrosis visualized. Renal cortical thinning.

Left Kidney:

Length: 11.9. Echogenicity within normal limits. No mass or
hydronephrosis visualized. Renal cortical thinning.

Bladder:

Appears normal for degree of bladder distention.
IMPRESSION: No hydronephrosis.  Bilateral renal cortical thinning.

## 2017-10-19 ENCOUNTER — Encounter: Payer: Self-pay | Admitting: Family Medicine

## 2017-10-19 ENCOUNTER — Ambulatory Visit: Payer: 59 | Admitting: Family Medicine

## 2017-10-19 ENCOUNTER — Other Ambulatory Visit: Payer: Self-pay

## 2017-10-19 VITALS — BP 122/78 | HR 78 | Temp 98.0°F | Resp 16 | Ht 64.96 in | Wt 258.0 lb

## 2017-10-19 DIAGNOSIS — M797 Fibromyalgia: Secondary | ICD-10-CM

## 2017-10-19 DIAGNOSIS — I519 Heart disease, unspecified: Secondary | ICD-10-CM

## 2017-10-19 DIAGNOSIS — F4323 Adjustment disorder with mixed anxiety and depressed mood: Secondary | ICD-10-CM

## 2017-10-19 DIAGNOSIS — E7800 Pure hypercholesterolemia, unspecified: Secondary | ICD-10-CM | POA: Diagnosis not present

## 2017-10-19 DIAGNOSIS — K219 Gastro-esophageal reflux disease without esophagitis: Secondary | ICD-10-CM | POA: Diagnosis not present

## 2017-10-19 DIAGNOSIS — G43009 Migraine without aura, not intractable, without status migrainosus: Secondary | ICD-10-CM

## 2017-10-19 DIAGNOSIS — S52591D Other fractures of lower end of right radius, subsequent encounter for closed fracture with routine healing: Secondary | ICD-10-CM | POA: Diagnosis not present

## 2017-10-19 DIAGNOSIS — I10 Essential (primary) hypertension: Secondary | ICD-10-CM | POA: Diagnosis not present

## 2017-10-19 DIAGNOSIS — G894 Chronic pain syndrome: Secondary | ICD-10-CM | POA: Diagnosis not present

## 2017-10-19 DIAGNOSIS — I5189 Other ill-defined heart diseases: Secondary | ICD-10-CM

## 2017-10-19 MED ORDER — OXYCODONE-ACETAMINOPHEN 5-325 MG PO TABS: 1.0000 | ORAL_TABLET | Freq: Two times a day (BID) | ORAL | 0 refills | Status: DC | PRN

## 2017-10-19 MED FILL — OXYCODONE-ACETAMINOPHEN 5-3: 5-325 | 15 days supply | Qty: 30 | Fill #0

## 2017-10-19 NOTE — Progress Notes (Signed)
Subjective:    Patient ID: ASUSENA SIGLEY, female    DOB: 15-Jul-1956, 62 y.o.   MRN: [id number]  10/19/2017  Chronic Conditions (3 month follow-up )    HPI This 62 y.o. female presents for three month follow-up of fibromyalgia, hypertension, hypercholesterolemia, migraines, chronic pain syndrome.    Suffered recent right distal radial and ulnar fractures requiring surgical revision.  Doing well postoperatively. -Since hospital discharge for several surgical revision right arm fractures, suffering with swelling.  Likely overhydration during the operative procedure.  Prescription for Lasix 20 mg 1-2 daily as needed.  Prescription for potassium chloride also provided.  Status post recent cardiac evaluation including echo.  Grade 1 diastolic dysfunction appreciated on echo. -Doing better emotionally since daughter and her family have moved out of the home.  Tolerating Zoloft in addition to Cymbalta therapy.  No changes to management at this time. -Tachycardia has improved with decreased Cymbalta dose.  Dyspnea on exertion continues yet no cardiac etiology to symptoms.  Most consistent with deconditioning and obesity.  Recommend weight loss, exercise, low-sodium diet. -Refill of Topamax provided for migraine prevention therapy.  Update since last visit:  Has been undergoing physical therapy. Finally graduated from physical therapy.  May have a screw that is irritating.   Weight down 5 pounds.   Has been depressed but knows why has been depressed.   Had a disagreement about grandchildren's biological father.  Took grandchildren with her when moved out; pt has been helping raise them.  Daughter has had to come by once per week.  Mateo Flow likes almond milk so patient took her almond milk.  Granddaughter wanted to spend the night with patient; understands why heart is broken.  Daughter is keeping grandchildren from pt.  Prays all day long.  Gwendolyn Fill is eleven and Mateo Flow is six.   Migraines are good.   When fell, bumped head and glasses hit.  Has been getting minor headaches around R periorbital region.  May need to get eyes checked.  No blurred vision or diplopia.  Not daily headaches; occurs intermittently; injury 07/06/17.  No loss of consciousness.  Migraines have been good. One migraine since last visit.  R lateral lower leg pain; intermittent; bruising.  Taking one Cymbalta and one Zoloft daily.   Needs refill of Percocet.   S/p cardiology consultation for palpitations; s/p monitor that revealed PAT short bursts; maintained on Propanolol. S/p echo that was essentially normal other than Grade I diastolic dysfunction. Chest pain reproducible on palpation of anterior chest; stress testing not recommended.  BP Readings from Last 3 Encounters:  10/28/17 130/82  10/19/17 122/78  08/14/17 108/80   Wt Readings from Last 3 Encounters:  10/28/17 260 lb (117.9 kg)  10/19/17 258 lb (117 kg)  08/14/17 262 lb (118.8 kg)   Immunization History  Administered Date(s) Administered  . Influenza,inj,Quad PF,6+ Mos 05/28/2015, 05/21/2016, 04/21/2017  . Tdap 06/25/2015  . Zoster Recombinat (Shingrix) 02/09/2017, 04/21/2017    Review of Systems  Constitutional: Negative for chills, diaphoresis, fatigue and fever.  Eyes: Negative for visual disturbance.  Respiratory: Negative for cough and shortness of breath.   Cardiovascular: Negative for chest pain, palpitations and leg swelling.  Gastrointestinal: Negative for abdominal pain, constipation, diarrhea, nausea and vomiting.  Endocrine: Negative for cold intolerance, heat intolerance, polydipsia, polyphagia and polyuria.  Musculoskeletal: Positive for arthralgias, joint swelling and myalgias.  Neurological: Negative for dizziness, tremors, seizures, syncope, facial asymmetry, speech difficulty, weakness, light-headedness, numbness and headaches.  Psychiatric/Behavioral: Positive for dysphoric mood.  Negative for self-injury, sleep disturbance and  suicidal ideas. The patient is not nervous/anxious.     Past Medical History:  Diagnosis Date  . Allergy    [name], Flonase  . Anemia   . Anxiety   . Arthritis   . Chronic kidney disease   . Colon polyps   . Depression   . Fibromyalgia   . Gastritis   . GERD (gastroesophageal reflux disease)   . Hernia, hiatal   . HLD (hyperlipidemia)   . Hypertension   . Migraine   . Neuropathy   . Osteoporosis   . Pneumonia   . Sleep apnea with use of continuous positive airway pressure (CPAP)   . Thyroid goiter    Past Surgical History:  Procedure Laterality Date  . ABDOMINAL HYSTERECTOMY     ovaries intact; DUB.  No dysplasia.  Marland Kitchen JOINT REPLACEMENT    . OPEN REDUCTION INTERNAL FIXATION (ORIF) DISTAL RADIAL FRACTURE Right 07/09/2017   Procedure: RIGHT WRIST OPEN REDUCTION INTERNAL FIXATION (ORIF) DISTAL RADIAL FRACTURE AND REPAIR AS INDICATED;  Surgeon: Iran Planas, MD;  Location: [facility];  Service: Orthopedics;  Laterality: Right;  . PARTIAL HYSTERECTOMY    . ROTATOR CUFF REPAIR Right 2009  . SHOULDER SURGERY Right    shoulder dislocation, fall, and elbow unla nerve  . SHOULDER SURGERY Right 2015   labral tear  . TUBAL LIGATION    . ULNAR NERVE REPAIR Right 08/19/2014   Allergies  Allergen Reactions  . Sulfa Antibiotics Itching   Current Outpatient Medications on File Prior to Visit  Medication Sig Dispense Refill  . butalbital-acetaminophen-caffeine (FIORICET, ESGIC) 50-325-40 MG tablet Take 1-2 tablets by mouth every 6 (six) hours as needed for headache. 40 tablet 0  . cyclobenzaprine (FLEXERIL) 10 MG tablet Take 0.5-1 tablets (5-10 mg total) by mouth 3 (three) times daily as needed for muscle spasms. 180 tablet 1  . docusate sodium (COLACE) 100 MG capsule TAKE 1 CAPSULE TWO TIMES DAILY  0  . DULoxetine (CYMBALTA) 60 MG capsule Take 2 capsules (120 mg total) by mouth daily. (Patient taking differently: Take 60 mg by mouth daily. ) 180 capsule 1  . furosemide (LASIX) 20 MG  tablet Take 1-2 tablets (20-40 mg total) by mouth daily as needed. 30 tablet 1  . ondansetron (ZOFRAN-ODT) 4 MG disintegrating tablet DISSOLVE 1 TABLET BY MOUTH EVERY 8 HOURS AS NEEDED FOR NAUSEA OR VOMITING. 20 tablet 4  . potassium chloride SA (K-DUR,KLOR-CON) 20 MEQ tablet Take 1-2 tablets (20-40 mEq total) by mouth daily as needed. 30 tablet 1  . propranolol (INDERAL) 60 MG tablet Take 1 tablet (60 mg total) 3 (three) times daily by mouth. 270 tablet 3  . topiramate (TOPAMAX) 50 MG tablet Take 1 tablet (50 mg total) by mouth 2 (two) times daily. 180 tablet 1   No current facility-administered medications on file prior to visit.    Social History   Socioeconomic History  . Marital status: Married    Spouse name: Not on file  . Number of children: 3  . Years of education: Not on file  . Highest education level: Not on file  Occupational History  . Occupation: unemployed  Social Needs  . Financial resource strain: Not on file  . Food insecurity:    Worry: Not on file    Inability: Not on file  . Transportation needs:    Medical: Not on file    Non-medical: Not on file  Tobacco Use  . Smoking status: Former Smoker  Last attempt to quit: 08/04/1998    Years since quitting: 19.3  . Smokeless tobacco: Never Used  Substance and Sexual Activity  . Alcohol use: No  . Drug use: No  . Sexual activity: Never    Birth control/protection: Surgical  Lifestyle  . Physical activity:    Days per week: Not on file    Minutes per session: Not on file  . Stress: Not on file  Relationships  . Social connections:    Talks on phone: Not on file    Gets together: Not on file    Attends religious service: Not on file    Active member of club or organization: Not on file    Attends meetings of clubs or organizations: Not on file    Relationship status: Not on file  . Intimate partner violence:    Fear of current or ex partner: Not on file    Emotionally abused: Not on file    Physically  abused: Not on file    Forced sexual activity: Not on file  Other Topics Concern  . Not on file  Social History Narrative   Marital status: married x 19 years; moderately happy     Children: 3 children ([place], [place], 39 April); 8 grandchildren; 0 gg     Lives: with husband/Steve, April, 2 granddaughters     Employment:  Unemployed; quit working 2015 with work related injury; awaiting disability in 2018; disability approved in 02/2017.       Tobacco: quit smoking in 2016; smoked x 2 years.      Alcohol: none      Drugs: none      Exercise: rarely in 2018         Family History  Problem Relation Age of Onset  . Heart disease Mother 54       AMI/CAD/CHF as cause of death  . Hypertension Mother   . Hyperlipidemia Mother   . Hypothyroidism Mother   . Depression Mother   . Gout Mother   . Heart disease Father 54       AMI  . Hypertension Father   . Stroke Father 52       mild CVA  . Prostate cancer Father   . Hyperlipidemia Father   . Cancer Father 25       prostate cancer  . Hypertension Brother   . Hyperlipidemia Brother   . Cancer Brother        prostate cancer  . Cancer Maternal Grandmother        type unknown  . Cancer Maternal Grandfather        type unknown  . Heart disease Paternal Grandmother   . Heart defect Paternal Grandfather   . Melanoma Brother   . Hyperlipidemia Brother   . Hypertension Brother   . Cancer Brother        melanoma  . Hypertension Brother   . Hyperlipidemia Brother   . Melanoma Brother   . Cancer Brother 25       melanoma  . Cancer Sister        Basal cell carcinoma scalp  . Depression Sister   . Hypertension Brother   . Depression Brother   . Hypertension Brother   . Gout Brother   . Arthritis Sister   . Depression Sister        Objective:    BP 122/78   Pulse 78   Temp 98 F (36.7 C) (Oral)   Resp  16   Ht 5' 4.96" (1.65 m)   Wt 258 lb (117 kg)   SpO2 93%   BMI 42.99 kg/m  Physical Exam  Constitutional: She  is oriented to person, place, and time. She appears well-developed and well-nourished. No distress.  HENT:  Head: Normocephalic and atraumatic.  Right Ear: External ear normal.  Left Ear: External ear normal.  Nose: Nose normal.  Mouth/Throat: Oropharynx is clear and moist.  Eyes: Conjunctivae and EOM are normal. Pupils are equal, round, and reactive to light.  Neck: Normal range of motion. Neck supple. Carotid bruit is not present. No thyromegaly present.  Cardiovascular: Normal rate, regular rhythm, normal heart sounds and intact distal pulses. Exam reveals no gallop and no friction rub.  No murmur heard. Pulmonary/Chest: Effort normal and breath sounds normal. She has no wheezes. She has no rales.  Abdominal: Soft. Bowel sounds are normal. She exhibits no distension and no mass. There is no tenderness. There is no rebound and no guarding.  Lymphadenopathy:    She has no cervical adenopathy.  Neurological: She is alert and oriented to person, place, and time. No cranial nerve deficit. She exhibits normal muscle tone. Coordination normal.  Skin: Skin is warm and dry. No rash noted. She is not diaphoretic. No erythema. No pallor.  Psychiatric: She has a normal mood and affect. Her behavior is normal. Judgment and thought content normal.   No results found. Depression screen [facility] 2/9 10/28/2017 10/19/2017 07/20/2017 05/25/2017 05/13/2017  Decreased Interest 1 3 0 1 0  Down, Depressed, Hopeless 1 3 0 1 0  PHQ - 2 Score 2 6 0 2 0  Altered sleeping - 1 - 1 -  Tired, decreased energy - 1 - 1 -  Change in appetite - 0 - 0 -  Feeling bad or failure about yourself  - 1 - 0 -  Trouble concentrating - 0 - 0 -  Moving slowly or fidgety/restless - 0 - 0 -  Suicidal thoughts - 0 - 0 -  PHQ-9 Score - 9 - 4 -  Difficult doing work/chores - - - - -  Some recent data might be hidden   Fall Risk  10/28/2017 10/19/2017 07/20/2017 05/25/2017 05/13/2017  Falls in the past year? No No Yes No No  Number  falls in past yr: - - 1 - -  Injury with Fall? - - Yes - -  Comment - - - - -        Assessment & Plan:   1. Pure hypercholesterolemia   2. Essential hypertension   3. Migraine without aura and without status migrainosus, not intractable   4. Gastroesophageal reflux disease without esophagitis   5. Adjustment disorder with mixed anxiety and depressed mood   6. Chronic pain syndrome   7. Fibromyalgia   8. Grade I diastolic dysfunction   9. Other closed fracture of distal end of right radius with routine healing, subsequent encounter     Worsening depression/anxiety due to family stressors; coping relatively well at this time.  Consider psychotherapy; continue current medications.  S/p surgical repair of R distal radius fracture: healing well; s/p physical therapy.  Palpitations and DOE: s/p cardiology consultation with holter monitor and echo; work up negative other than Grade I diastolic dysfunction.  Hypercholesterolemia: controlled; obtain labs; continue current medications.  Chronic pain syndrome: stable on Gabapentin and Percocet which using sparingly; previous R arm injury.    Orders Placed This Encounter  Procedures  . CBC with Differential/Platelet  .  Comprehensive metabolic panel    Order Specific Question:   Has the patient fasted?    Answer:   No  . Lipid panel    Order Specific Question:   Has the patient fasted?    Answer:   No   Meds ordered this encounter  Medications  . oxyCODONE-acetaminophen (PERCOCET/ROXICET) 5-325 MG tablet    Sig: Take 1 tablet by mouth every 12 (twelve) hours as needed for severe pain.    Dispense:  30 tablet    Refill:  0    Return in about 3 months (around 01/19/2018) for complete physical examiniation.   Dasia Guerrier Elayne Guerin, M.D. Primary Care at [facility] [address] [place], [place]  [address] [phone] phone [phone] fax

## 2017-10-19 NOTE — Patient Instructions (Addendum)
We recommend that you schedule a mammogram for breast cancer screening. Typically, you do not need a referral to do this. Please contact a local imaging center to schedule your mammogram.  [facility] - [phone]  *ask for the Radiology Department [facility] - [phone] or [phone]  [facility] - [phone] [facility] - [phone] [facility] - [phone]  *ask for the Radiology Department [facility] - [phone]  *ask for the Radiology Department [facility] - [phone]  *ask for the Mammography Department [facility] - [phone]     IF you received an x-ray today, you will receive an invoice from [facility]. Please contact [facility] at [phone] with questions or concerns regarding your invoice.   IF you received labwork today, you will receive an invoice from [facility]. Please contact [facility] at [phone] with questions or concerns regarding your invoice.   Our billing staff will not be able to assist you with questions regarding bills from these companies.  You will be contacted with the lab results as soon as they are available. The fastest way to get your results is to activate your My Chart account. Instructions are located on the last page of this paperwork. If you have not heard from us regarding the results in 2 weeks, please contact this office.      

## 2017-10-20 LAB — CBC WITH DIFFERENTIAL/PLATELET
BASOS ABS: 0 10*3/uL (ref 0.0–0.2)
BASOS: 0 %
EOS (ABSOLUTE): 0.1 10*3/uL (ref 0.0–0.4)
EOS: 3 %
HEMATOCRIT: 36.2 % (ref 34.0–46.6)
HEMOGLOBIN: 11.4 g/dL (ref 11.1–15.9)
IMMATURE GRANS (ABS): 0 10*3/uL (ref 0.0–0.1)
Immature Granulocytes: 0 %
LYMPHS ABS: 1.3 10*3/uL (ref 0.7–3.1)
LYMPHS: 33 %
MCH: 26.6 pg (ref 26.6–33.0)
MCHC: 31.5 g/dL (ref 31.5–35.7)
MCV: 84 fL (ref 79–97)
MONOCYTES: 7 %
Monocytes Absolute: 0.3 10*3/uL (ref 0.1–0.9)
Neutrophils Absolute: 2.3 10*3/uL (ref 1.4–7.0)
Neutrophils: 57 %
Platelets: 255 10*3/uL (ref 150–379)
RBC: 4.29 x10E6/uL (ref 3.77–5.28)
RDW: 14.7 % (ref 12.3–15.4)
WBC: 4 10*3/uL (ref 3.4–10.8)

## 2017-10-20 LAB — COMPREHENSIVE METABOLIC PANEL
ALBUMIN: 4.3 g/dL (ref 3.6–4.8)
ALT: 10 IU/L (ref 0–32)
AST: 21 IU/L (ref 0–40)
Albumin/Globulin Ratio: 1.8 (ref 1.2–2.2)
Alkaline Phosphatase: 93 IU/L (ref 39–117)
BUN / CREAT RATIO: 14 (ref 12–28)
BUN: 14 mg/dL (ref 8–27)
Bilirubin Total: 0.2 mg/dL (ref 0.0–1.2)
CHLORIDE: 108 mmol/L — AB (ref 96–106)
CO2: 21 mmol/L (ref 20–29)
CREATININE: 1 mg/dL (ref 0.57–1.00)
Calcium: 8.9 mg/dL (ref 8.7–10.3)
GFR calc non Af Amer: 61 mL/min/{1.73_m2} (ref >59)
GFR, EST AFRICAN AMERICAN: 70 mL/min/{1.73_m2} (ref >59)
GLOBULIN, TOTAL: 2.4 g/dL (ref 1.5–4.5)
GLUCOSE: 98 mg/dL (ref 65–99)
Potassium: 4.7 mmol/L (ref 3.5–5.2)
SODIUM: 142 mmol/L (ref 134–144)
TOTAL PROTEIN: 6.7 g/dL (ref 6.0–8.5)

## 2017-10-20 LAB — LIPID PANEL
Chol/HDL Ratio: 2.8 ratio (ref 0.0–4.4)
Cholesterol, Total: 171 mg/dL (ref 100–199)
HDL: 61 mg/dL (ref >39)
LDL CALC: 93 mg/dL (ref 0–99)
Triglycerides: 84 mg/dL (ref 0–149)
VLDL CHOLESTEROL CAL: 17 mg/dL (ref 5–40)

## 2017-10-28 ENCOUNTER — Encounter: Payer: Self-pay | Admitting: Family Medicine

## 2017-10-28 ENCOUNTER — Ambulatory Visit: Payer: 59 | Admitting: Family Medicine

## 2017-10-28 ENCOUNTER — Other Ambulatory Visit: Payer: Self-pay | Admitting: Family Medicine

## 2017-10-28 ENCOUNTER — Other Ambulatory Visit: Payer: Self-pay

## 2017-10-28 VITALS — BP 130/82 | HR 84 | Temp 98.0°F | Resp 16 | Ht 64.96 in | Wt 260.0 lb

## 2017-10-28 DIAGNOSIS — F4323 Adjustment disorder with mixed anxiety and depressed mood: Secondary | ICD-10-CM | POA: Diagnosis not present

## 2017-10-28 DIAGNOSIS — G894 Chronic pain syndrome: Secondary | ICD-10-CM | POA: Diagnosis not present

## 2017-10-28 DIAGNOSIS — E7800 Pure hypercholesterolemia, unspecified: Secondary | ICD-10-CM

## 2017-10-28 DIAGNOSIS — K219 Gastro-esophageal reflux disease without esophagitis: Secondary | ICD-10-CM

## 2017-10-28 DIAGNOSIS — M797 Fibromyalgia: Secondary | ICD-10-CM

## 2017-10-28 DIAGNOSIS — I10 Essential (primary) hypertension: Secondary | ICD-10-CM | POA: Diagnosis not present

## 2017-10-28 MED ORDER — GABAPENTIN 300 MG PO CAPS: 300.0000 mg | ORAL_CAPSULE | Freq: Four times a day (QID) | ORAL | 1 refills | Status: DC

## 2017-10-28 MED ORDER — RANITIDINE HCL 150 MG PO TABS: 150.0000 mg | ORAL_TABLET | Freq: Every day | ORAL | 3 refills | Status: DC

## 2017-10-28 MED ORDER — DEXLANSOPRAZOLE 60 MG PO CPDR: 60.0000 mg | DELAYED_RELEASE_CAPSULE | Freq: Every day | ORAL | 3 refills | Status: DC

## 2017-10-28 MED ORDER — MUPIROCIN 2 % EX OINT: 1.0000 "application " | TOPICAL_OINTMENT | Freq: Three times a day (TID) | CUTANEOUS | 1 refills | Status: DC

## 2017-10-28 MED ORDER — SERTRALINE HCL 50 MG PO TABS
50.0000 mg | ORAL_TABLET | Freq: Every day | ORAL | 1 refills | Status: DC
Start: 2017-10-28 — End: 2018-02-20

## 2017-10-28 MED ORDER — ATORVASTATIN CALCIUM 10 MG PO TABS: 10.0000 mg | ORAL_TABLET | Freq: Every day | ORAL | 3 refills | Status: DC

## 2017-10-28 MED ORDER — DARIFENACIN HYDROBROMIDE ER 15 MG PO TB24: 15.0000 mg | ORAL_TABLET | Freq: Every day | ORAL | 3 refills | Status: DC

## 2017-10-28 MED ORDER — LISINOPRIL 20 MG PO TABS
20.0000 mg | ORAL_TABLET | Freq: Every day | ORAL | 3 refills | Status: DC
Start: 2017-10-28 — End: 2018-12-15

## 2017-10-28 MED FILL — DARIFENACIN ER 15 MG TABLET: 15 | 90 days supply | Qty: 90 | Fill #0

## 2017-10-28 MED FILL — MUPIROCIN 2% OINTMENT: 2 | 7 days supply | Qty: 22 | Fill #0

## 2017-10-28 MED FILL — ATORVASTATIN 10 MG TABLET: 10 | 90 days supply | Qty: 90 | Fill #0

## 2017-10-28 MED FILL — GABAPENTIN 300 MG CAPSULE: 300 | 68 days supply | Qty: 540 | Fill #0

## 2017-10-28 MED FILL — ONDANSETRON ODT 4 MG TABLET: 4 | 7 days supply | Qty: 20 | Fill #1

## 2017-10-28 MED FILL — TOPIRAMATE 50 MG TABLET: 50 | 90 days supply | Qty: 180 | Fill #1

## 2017-10-28 NOTE — Progress Notes (Signed)
Subjective:    Patient ID: [name], female    DOB: February 05, 1956, 62 y.o.   MRN: [id number]  10/28/2017  Medication Refill (Enablex and Gabapintin and other chronic medication )    HPI This 62 y.o. female presents for medication refills of gabapentin and Enablex.  Patient was just evaluated 2 weeks ago for chronic medical follow-up.  Refills of medications were denied despite recent visit.  Patient is stable on medications but also has one acute issue as follows:  Chelitis: provided with nystatin swish and swallow without much improvement.  Dry mouth with recurrence.  No recent changes in toothpaste.  Chapstick carmax    BP Readings from Last 3 Encounters:  12/15/17 108/84  12/10/17 120/80  10/28/17 130/82   Wt Readings from Last 3 Encounters:  12/15/17 259 lb (117.5 kg)  12/10/17 257 lb 12.8 oz (116.9 kg)  10/28/17 260 lb (117.9 kg)   Immunization History  Administered Date(s) Administered  . Influenza,inj,Quad PF,6+ Mos 05/28/2015, 05/21/2016, 04/21/2017  . Tdap 06/25/2015  . Zoster Recombinat (Shingrix) 02/09/2017, 04/21/2017    Review of Systems  Constitutional: Negative for chills, diaphoresis, fatigue and fever.  HENT: Negative for congestion, ear discharge, ear pain, nosebleeds, postnasal drip, rhinorrhea, sinus pain, sneezing, sore throat and trouble swallowing.   Eyes: Negative for visual disturbance.  Respiratory: Negative for cough and shortness of breath.   Cardiovascular: Negative for chest pain, palpitations and leg swelling.  Gastrointestinal: Negative for abdominal pain, constipation, diarrhea, nausea and vomiting.  Endocrine: Negative for cold intolerance, heat intolerance, polydipsia, polyphagia and polyuria.  Musculoskeletal: Positive for arthralgias and myalgias.  Neurological: Positive for numbness. Negative for dizziness, tremors, seizures, syncope, facial asymmetry, speech difficulty, weakness, light-headedness and headaches.    Psychiatric/Behavioral: Negative for dysphoric mood and self-injury. The patient is not nervous/anxious.     Past Medical History:  Diagnosis Date  . Allergy    [name], Flonase  . Anemia   . Anxiety   . Arthritis   . Chronic kidney disease   . Colon polyps   . Depression   . Fibromyalgia   . Gastritis   . GERD (gastroesophageal reflux disease)   . Hernia, hiatal   . HLD (hyperlipidemia)   . Hypertension   . Migraine   . Neuropathy   . Osteoporosis   . Pneumonia   . Sleep apnea with use of continuous positive airway pressure (CPAP)   . Thyroid goiter    Past Surgical History:  Procedure Laterality Date  . ABDOMINAL HYSTERECTOMY     ovaries intact; DUB.  No dysplasia.  Marland Kitchen JOINT REPLACEMENT    . OPEN REDUCTION INTERNAL FIXATION (ORIF) DISTAL RADIAL FRACTURE Right 07/09/2017   Procedure: RIGHT WRIST OPEN REDUCTION INTERNAL FIXATION (ORIF) DISTAL RADIAL FRACTURE AND REPAIR AS INDICATED;  Surgeon: Iran Planas, MD;  Location: [facility];  Service: Orthopedics;  Laterality: Right;  . PARTIAL HYSTERECTOMY    . ROTATOR CUFF REPAIR Right 2009  . SHOULDER SURGERY Right    shoulder dislocation, fall, and elbow unla nerve  . SHOULDER SURGERY Right 2015   labral tear  . TUBAL LIGATION    . ULNAR NERVE REPAIR Right 08/19/2014   Allergies  Allergen Reactions  . Sulfa Antibiotics Itching   Current Outpatient Medications on File Prior to Visit  Medication Sig Dispense Refill  . butalbital-acetaminophen-caffeine (FIORICET, ESGIC) 50-325-40 MG tablet Take 1-2 tablets by mouth every 6 (six) hours as needed for headache. 40 tablet 0  . cyclobenzaprine (FLEXERIL) 10 MG tablet  Take 0.5-1 tablets (5-10 mg total) by mouth 3 (three) times daily as needed for muscle spasms. 180 tablet 1  . docusate sodium (COLACE) 100 MG capsule TAKE 1 CAPSULE TWO TIMES DAILY  0  . DULoxetine (CYMBALTA) 60 MG capsule Take 2 capsules (120 mg total) by mouth daily. (Patient taking differently: Take 60 mg by mouth  daily. ) 180 capsule 1  . furosemide (LASIX) 20 MG tablet Take 1-2 tablets (20-40 mg total) by mouth daily as needed. 30 tablet 1  . ondansetron (ZOFRAN-ODT) 4 MG disintegrating tablet DISSOLVE 1 TABLET BY MOUTH EVERY 8 HOURS AS NEEDED FOR NAUSEA OR VOMITING. 20 tablet 4  . oxyCODONE-acetaminophen (PERCOCET/ROXICET) 5-325 MG tablet Take 1 tablet by mouth every 12 (twelve) hours as needed for severe pain. 30 tablet 0  . potassium chloride SA (K-DUR,KLOR-CON) 20 MEQ tablet Take 1-2 tablets (20-40 mEq total) by mouth daily as needed. 30 tablet 1  . propranolol (INDERAL) 60 MG tablet Take 1 tablet (60 mg total) 3 (three) times daily by mouth. 270 tablet 3  . topiramate (TOPAMAX) 50 MG tablet Take 1 tablet (50 mg total) by mouth 2 (two) times daily. 180 tablet 1   No current facility-administered medications on file prior to visit.    Social History   Socioeconomic History  . Marital status: Married    Spouse name: Not on file  . Number of children: 3  . Years of education: Not on file  . Highest education level: Not on file  Occupational History  . Occupation: unemployed  Social Needs  . Financial resource strain: Not on file  . Food insecurity:    Worry: Not on file    Inability: Not on file  . Transportation needs:    Medical: Not on file    Non-medical: Not on file  Tobacco Use  . Smoking status: Former Smoker    Last attempt to quit: 08/04/1998    Years since quitting: 19.4  . Smokeless tobacco: Never Used  Substance and Sexual Activity  . Alcohol use: No  . Drug use: No  . Sexual activity: Yes    Birth control/protection: Surgical  Lifestyle  . Physical activity:    Days per week: Not on file    Minutes per session: Not on file  . Stress: Not on file  Relationships  . Social connections:    Talks on phone: Not on file    Gets together: Not on file    Attends religious service: Not on file    Active member of club or organization: Not on file    Attends meetings of  clubs or organizations: Not on file    Relationship status: Not on file  . Intimate partner violence:    Fear of current or ex partner: Not on file    Emotionally abused: Not on file    Physically abused: Not on file    Forced sexual activity: Not on file  Other Topics Concern  . Not on file  Social History Narrative   Marital status: married x 19 years; moderately happy     Children: 3 children ([place], [place], 39 April); 8 grandchildren; 0 gg     Lives: with husband/Steve, April, 2 granddaughters     Employment:  Unemployed; quit working 2015 with work related injury; awaiting disability in 2018; disability approved in 02/2017.       Tobacco: quit smoking in 2016; smoked x 2 years.      Alcohol: none  Drugs: none      Exercise: rarely in 2018         Family History  Problem Relation Age of Onset  . Heart disease Mother 73       AMI/CAD/CHF as cause of death  . Hypertension Mother   . Hyperlipidemia Mother   . Hypothyroidism Mother   . Depression Mother   . Gout Mother   . Heart disease Father 51       AMI  . Hypertension Father   . Stroke Father 76       mild CVA  . Prostate cancer Father   . Hyperlipidemia Father   . Cancer Father 58       prostate cancer  . Hypertension Brother   . Hyperlipidemia Brother   . Cancer Brother        prostate cancer  . Cancer Maternal Grandmother        type unknown  . Cancer Maternal Grandfather        type unknown  . Heart disease Paternal Grandmother   . Heart defect Paternal Grandfather   . Melanoma Brother   . Hyperlipidemia Brother   . Hypertension Brother   . Cancer Brother        melanoma  . Hypertension Brother   . Hyperlipidemia Brother   . Melanoma Brother   . Cancer Brother 25       melanoma  . Cancer Sister        Basal cell carcinoma scalp  . Depression Sister   . Hypertension Brother   . Depression Brother   . Hypertension Brother   . Gout Brother   . Arthritis Sister   . Depression Sister         Objective:    BP 130/82   Pulse 84   Temp 98 F (36.7 C) (Oral)   Resp 16   Ht 5' 4.96" (1.65 m)   Wt 260 lb (117.9 kg)   SpO2 96%   BMI 43.32 kg/m  Physical Exam  Constitutional: She is oriented to person, place, and time. She appears well-developed and well-nourished. No distress.  HENT:  Head: Normocephalic and atraumatic.  Right Ear: External ear normal.  Left Ear: External ear normal.  Nose: Nose normal.  Mouth/Throat: Oropharynx is clear and moist.  Erythema and slight scaling and corners of bilateral vermilion borders  Eyes: Pupils are equal, round, and reactive to light. Conjunctivae and EOM are normal.  Neck: Normal range of motion. Neck supple. Carotid bruit is not present. No thyromegaly present.  Cardiovascular: Normal rate, regular rhythm, normal heart sounds and intact distal pulses. Exam reveals no gallop and no friction rub.  No murmur heard. Pulmonary/Chest: Effort normal and breath sounds normal. She has no wheezes. She has no rales.  Abdominal: Soft. Bowel sounds are normal. She exhibits no distension and no mass. There is no tenderness. There is no rebound and no guarding.  Lymphadenopathy:    She has no cervical adenopathy.  Neurological: She is alert and oriented to person, place, and time. No cranial nerve deficit.  Skin: Skin is warm and dry. No rash noted. She is not diaphoretic. No erythema. No pallor.  Psychiatric: She has a normal mood and affect. Her behavior is normal.   No results found. Depression screen [facility] 2/9 12/15/2017 12/10/2017 10/28/2017 10/19/2017 07/20/2017  Decreased Interest 0 0 1 3 0  Down, Depressed, Hopeless 0 0 1 3 0  PHQ - 2 Score 0 0 2 6 0  Altered sleeping - - - 1 -  Tired, decreased energy - - - 1 -  Change in appetite - - - 0 -  Feeling bad or failure about yourself  - - - 1 -  Trouble concentrating - - - 0 -  Moving slowly or fidgety/restless - - - 0 -  Suicidal thoughts - - - 0 -  PHQ-9 Score - - - 9 -  Difficult  doing work/chores - - - - -  Some recent data might be hidden   Fall Risk  12/15/2017 12/10/2017 10/28/2017 10/19/2017 07/20/2017  Falls in the past year? Yes No No No Yes  Number falls in past yr: 1 - - - 1  Injury with Fall? Yes - - - Yes  Comment wrist surgery - - - -        Assessment & Plan:   1. Chronic pain syndrome   2. Fibromyalgia   3. Essential hypertension   4. Pure hypercholesterolemia   5. Gastroesophageal reflux disease without esophagitis   6. Adjustment disorder with mixed anxiety and depressed mood     Stable chronic medical conditions including fibromyalgia, hypertension, hyper cholesterolemia, GERD, anxiety with depression.  Refills of medications provided.  Chelitis: persistent despite nystatin; rx for bactroban.   No orders of the defined types were placed in this encounter.  Meds ordered this encounter  Medications  . darifenacin (ENABLEX) 15 MG 24 hr tablet    Sig: Take 1 tablet (15 mg total) by mouth daily.    Dispense:  90 tablet    Refill:  3  . gabapentin (NEURONTIN) 300 MG capsule    Sig: Take 1-2 capsules (300-600 mg total) by mouth 4 (four) times daily.    Dispense:  540 capsule    Refill:  1  . lisinopril (PRINIVIL,ZESTRIL) 20 MG tablet    Sig: Take 1 tablet (20 mg total) by mouth daily.    Dispense:  90 tablet    Refill:  3  . atorvastatin (LIPITOR) 10 MG tablet    Sig: Take 1 tablet (10 mg total) by mouth daily at 6 PM.    Dispense:  90 tablet    Refill:  3  . ranitidine (ZANTAC) 150 MG tablet    Sig: Take 1 tablet (150 mg total) by mouth at bedtime.    Dispense:  90 tablet    Refill:  3  . sertraline (ZOLOFT) 50 MG tablet    Sig: Take 1 tablet (50 mg total) by mouth daily.    Dispense:  90 tablet    Refill:  1  . dexlansoprazole (DEXILANT) 60 MG capsule    Sig: Take 1 capsule (60 mg total) by mouth daily.    Dispense:  90 capsule    Refill:  3  . mupirocin ointment (BACTROBAN) 2 %    Sig: Apply 1 application topically 3 (three)  times daily.    Dispense:  22 g    Refill:  1    No follow-ups on file.   Naylin Burkle Elayne Guerin, M.D. Primary Care at [facility] [address] [place], [place]  [address] [phone] phone [phone] fax

## 2017-11-11 MED FILL — raNITIdine HCL 150 MG TABS: 150 | 90 days supply | Qty: 90 | Fill #0

## 2017-11-11 MED FILL — LISINOPRIL 20 MG TABLET: 20 | 90 days supply | Qty: 90 | Fill #0

## 2017-11-11 MED FILL — DEXILANT DR 60 MG CAPSULE: 60 | 90 days supply | Qty: 90 | Fill #0

## 2017-11-11 MED FILL — PROPRANOLOL 60 MG TABLET: 60 | 90 days supply | Qty: 270 | Fill #1

## 2017-11-27 MED FILL — ONDANSETRON ODT 4 MG TABLET: 4 | 7 days supply | Qty: 20 | Fill #2

## 2017-12-08 MED FILL — SERTRALINE HCL 50 MG TABLET: 50 | 90 days supply | Qty: 90 | Fill #0

## 2017-12-10 ENCOUNTER — Ambulatory Visit: Payer: 59 | Admitting: Family Medicine

## 2017-12-10 ENCOUNTER — Other Ambulatory Visit: Payer: Self-pay

## 2017-12-10 ENCOUNTER — Encounter: Payer: Self-pay | Admitting: Family Medicine

## 2017-12-10 ENCOUNTER — Ambulatory Visit (INDEPENDENT_AMBULATORY_CARE_PROVIDER_SITE_OTHER): Payer: 59

## 2017-12-10 VITALS — BP 120/80 | HR 73 | Temp 98.5°F | Ht 64.57 in | Wt 257.8 lb

## 2017-12-10 DIAGNOSIS — J22 Unspecified acute lower respiratory infection: Secondary | ICD-10-CM | POA: Diagnosis not present

## 2017-12-10 DIAGNOSIS — R05 Cough: Secondary | ICD-10-CM | POA: Diagnosis not present

## 2017-12-10 DIAGNOSIS — R059 Cough, unspecified: Secondary | ICD-10-CM

## 2017-12-10 DIAGNOSIS — R062 Wheezing: Secondary | ICD-10-CM | POA: Diagnosis not present

## 2017-12-10 DIAGNOSIS — R509 Fever, unspecified: Secondary | ICD-10-CM

## 2017-12-10 DIAGNOSIS — R0602 Shortness of breath: Secondary | ICD-10-CM | POA: Diagnosis not present

## 2017-12-10 LAB — POCT CBC
Granulocyte percent: 54.9 %G (ref 37–80)
HCT, POC: 35 % — AB (ref 37.7–47.9)
Hemoglobin: 11.1 g/dL — AB (ref 12.2–16.2)
Lymph, poc: 1.5 (ref 0.6–3.4)
MCH, POC: 26.5 pg — AB (ref 27–31.2)
MCHC: 31.7 g/dL — AB (ref 31.8–35.4)
MCV: 83.5 fL (ref 80–97)
MID (cbc): 0.4 (ref 0–0.9)
MPV: 7.5 fL (ref 0–99.8)
POC Granulocyte: 2.3 (ref 2–6.9)
POC LYMPH PERCENT: 36.7 %L (ref 10–50)
POC MID %: 8.4 %M (ref 0–12)
Platelet Count, POC: 255 10*3/uL (ref 142–424)
RBC: 4.19 M/uL (ref 4.04–5.48)
RDW, POC: 15 %
WBC: 4.2 10*3/uL — AB (ref 4.6–10.2)

## 2017-12-10 MED ORDER — DOXYCYCLINE HYCLATE 100 MG PO TABS: 100.0000 mg | ORAL_TABLET | Freq: Two times a day (BID) | ORAL | 0 refills | Status: DC

## 2017-12-10 MED ORDER — ALBUTEROL SULFATE 108 (90 BASE) MCG/ACT IN AEPB: 2.0000 | INHALATION_SPRAY | Freq: Four times a day (QID) | RESPIRATORY_TRACT | 1 refills | Status: DC | PRN

## 2017-12-10 MED ORDER — BENZONATATE 100 MG PO CAPS: 100.0000 mg | ORAL_CAPSULE | Freq: Two times a day (BID) | ORAL | 0 refills | Status: DC | PRN

## 2017-12-10 MED ORDER — PREDNISONE 20 MG PO TABS: 40.0000 mg | ORAL_TABLET | Freq: Every day | ORAL | 0 refills | Status: DC

## 2017-12-10 MED ORDER — ALBUTEROL SULFATE (2.5 MG/3ML) 0.083% IN NEBU
2.5000 mg | INHALATION_SOLUTION | Freq: Once | RESPIRATORY_TRACT | Status: AC
  Administered 2017-12-10: 2.5 mg via RESPIRATORY_TRACT

## 2017-12-10 MED FILL — DOXYCYCLINE HYCLATE 100 MG: 100 | 10 days supply | Qty: 20 | Fill #0

## 2017-12-10 MED FILL — BENZONATATE 100 MG CAPS: 100 | 5 days supply | Qty: 20 | Fill #0

## 2017-12-10 MED FILL — PROAIR RESPICLICK INHAL PWD: 108 (90 BAS | 25 days supply | Qty: 1 | Fill #0

## 2017-12-10 MED FILL — predniSONE 20 MG TABS: 20 | 3 days supply | Qty: 6 | Fill #0

## 2017-12-10 NOTE — Progress Notes (Signed)
5/9/20194:09 PM  [name] 1955/09/08, 62 y.o. female [id number]  Chief Complaint  Patient presents with  . Wheezing    coughing and wheezing episodes since Monday of this week. Does neb tx at home. Last one was last night. Tuesday night 101 temperature    HPI:   Patient is a 62 y.o. female with past medical history significant for HTN, HLP, OSA on cpap, dCHF, former smoker and seasonal allergies who presents today for 4 days of coughing and wheezing  She states that 4 days ago started with dry cough, thought it was her allergies But then started wheezing and fever, Tm 101 2 nights ago She feels a bit achy, her back is hurting, having pain with deep breathing She is feeling fatigued, has mild sore throat Denies any ear pain, sinus pain, nausea, vomiting or diarrhea She quit smoking about 15 years ago She denies ah/o asthma or copd She has had PNA before  She denies any sick contacts  Fall Risk  12/10/2017 10/28/2017 10/19/2017 07/20/2017 05/25/2017  Falls in the past year? No No No Yes No  Number falls in past yr: - - - 1 -  Injury with Fall? - - - Yes -  Comment - - - - -     Depression screen [facility] 2/9 12/10/2017 10/28/2017 10/19/2017  Decreased Interest 0 1 3  Down, Depressed, Hopeless 0 1 3  PHQ - 2 Score 0 2 6  Altered sleeping - - 1  Tired, decreased energy - - 1  Change in appetite - - 0  Feeling bad or failure about yourself  - - 1  Trouble concentrating - - 0  Moving slowly or fidgety/restless - - 0  Suicidal thoughts - - 0  PHQ-9 Score - - 9  Difficult doing work/chores - - -  Some recent data might be hidden    Allergies  Allergen Reactions  . Sulfa Antibiotics Itching    Prior to Admission medications   Medication Sig Start Date End Date Taking? Authorizing Provider  atorvastatin (LIPITOR) 10 MG tablet Take 1 tablet (10 mg total) by mouth daily at 6 PM. 10/28/17  Yes Wardell Honour, MD  butalbital-acetaminophen-caffeine (FIORICET, ESGIC) [phone] MG  tablet Take 1-2 tablets by mouth every 6 (six) hours as needed for headache. 04/21/17 04/21/18 Yes Wardell Honour, MD  cyclobenzaprine (FLEXERIL) 10 MG tablet Take 0.5-1 tablets (5-10 mg total) by mouth 3 (three) times daily as needed for muscle spasms. 04/21/17  Yes Wardell Honour, MD  darifenacin (ENABLEX) 15 MG 24 hr tablet Take 1 tablet (15 mg total) by mouth daily. 10/28/17  Yes Wardell Honour, MD  dexlansoprazole (DEXILANT) 60 MG capsule Take 1 capsule (60 mg total) by mouth daily. 10/28/17  Yes Wardell Honour, MD  docusate sodium (COLACE) 100 MG capsule TAKE 1 CAPSULE TWO TIMES DAILY 07/09/17  Yes [provider]  DULoxetine (CYMBALTA) 60 MG capsule Take 2 capsules (120 mg total) by mouth daily. Patient taking differently: Take 60 mg by mouth daily.  02/17/17  Yes Wardell Honour, MD  furosemide (LASIX) 20 MG tablet Take 1-2 tablets (20-40 mg total) by mouth daily as needed. 07/20/17  Yes Wardell Honour, MD  gabapentin (NEURONTIN) 300 MG capsule Take 1-2 capsules (300-600 mg total) by mouth 4 (four) times daily. 10/28/17  Yes Wardell Honour, MD  lisinopril (PRINIVIL,ZESTRIL) 20 MG tablet Take 1 tablet (20 mg total) by mouth daily. 10/28/17  Yes Wardell Honour,  MD  mupirocin ointment (BACTROBAN) 2 % Apply 1 application topically 3 (three) times daily. 10/28/17  Yes Wardell Honour, MD  ondansetron (ZOFRAN-ODT) 4 MG disintegrating tablet DISSOLVE 1 TABLET BY MOUTH EVERY 8 HOURS AS NEEDED FOR NAUSEA OR VOMITING. 09/18/17  Yes Wardell Honour, MD  oxyCODONE-acetaminophen (PERCOCET/ROXICET) 5-325 MG tablet Take 1 tablet by mouth every 12 (twelve) hours as needed for severe pain. 10/19/17  Yes Wardell Honour, MD  potassium chloride SA (K-DUR,KLOR-CON) 20 MEQ tablet Take 1-2 tablets (20-40 mEq total) by mouth daily as needed. 07/20/17  Yes Wardell Honour, MD  propranolol (INDERAL) 60 MG tablet Take 1 tablet (60 mg total) 3 (three) times daily by mouth. 06/11/17  Yes Leonie Man, MD    ranitidine (ZANTAC) 150 MG tablet Take 1 tablet (150 mg total) by mouth at bedtime. 10/28/17  Yes Wardell Honour, MD  sertraline (ZOLOFT) 50 MG tablet Take 1 tablet (50 mg total) by mouth daily. 10/28/17  Yes Wardell Honour, MD  topiramate (TOPAMAX) 50 MG tablet Take 1 tablet (50 mg total) by mouth 2 (two) times daily. 07/20/17  Yes Wardell Honour, MD    Past Medical History:  Diagnosis Date  . Allergy    [name], Flonase  . Anemia   . Anxiety   . Arthritis   . Chronic kidney disease   . Colon polyps   . Depression   . Fibromyalgia   . Gastritis   . GERD (gastroesophageal reflux disease)   . Hernia, hiatal   . HLD (hyperlipidemia)   . Hypertension   . Migraine   . Neuropathy   . Osteoporosis   . Pneumonia   . Sleep apnea with use of continuous positive airway pressure (CPAP)   . Thyroid goiter     Past Surgical History:  Procedure Laterality Date  . ABDOMINAL HYSTERECTOMY     ovaries intact; DUB.  No dysplasia.  Marland Kitchen JOINT REPLACEMENT    . OPEN REDUCTION INTERNAL FIXATION (ORIF) DISTAL RADIAL FRACTURE Right 07/09/2017   Procedure: RIGHT WRIST OPEN REDUCTION INTERNAL FIXATION (ORIF) DISTAL RADIAL FRACTURE AND REPAIR AS INDICATED;  Surgeon: Iran Planas, MD;  Location: [facility];  Service: Orthopedics;  Laterality: Right;  . PARTIAL HYSTERECTOMY    . ROTATOR CUFF REPAIR Right 2009  . SHOULDER SURGERY Right    shoulder dislocation, fall, and elbow unla nerve  . SHOULDER SURGERY Right 2015   labral tear  . TUBAL LIGATION    . ULNAR NERVE REPAIR Right 08/19/2014    Social History   Tobacco Use  . Smoking status: Former Smoker    Last attempt to quit: 08/04/1998    Years since quitting: 19.3  . Smokeless tobacco: Never Used  Substance Use Topics  . Alcohol use: No    Family History  Problem Relation Age of Onset  . Heart disease Mother 19       AMI/CAD/CHF as cause of death  . Hypertension Mother   . Hyperlipidemia Mother   . Hypothyroidism Mother   . Depression  Mother   . Gout Mother   . Heart disease Father 67       AMI  . Hypertension Father   . Stroke Father 41       mild CVA  . Prostate cancer Father   . Hyperlipidemia Father   . Cancer Father 17       prostate cancer  . Hypertension Brother   . Hyperlipidemia Brother   . Cancer Brother  prostate cancer  . Cancer Maternal Grandmother        type unknown  . Cancer Maternal Grandfather        type unknown  . Heart disease Paternal Grandmother   . Heart defect Paternal Grandfather   . Melanoma Brother   . Hyperlipidemia Brother   . Hypertension Brother   . Cancer Brother        melanoma  . Hypertension Brother   . Hyperlipidemia Brother   . Melanoma Brother   . Cancer Brother 25       melanoma  . Cancer Sister        Basal cell carcinoma scalp  . Depression Sister   . Hypertension Brother   . Depression Brother   . Hypertension Brother   . Gout Brother   . Arthritis Sister   . Depression Sister     ROS Per hpi  OBJECTIVE:  Blood pressure 120/80, pulse 73, temperature 98.5 F (36.9 C), temperature source Oral, height 5' 4.57" (1.64 m), weight 257 lb 12.8 oz (116.9 kg), SpO2 96 %.  Physical Exam  Constitutional: She is oriented to person, place, and time.  HENT:  Head: Normocephalic and atraumatic.  Right Ear: Hearing, tympanic membrane, external ear and ear canal normal.  Left Ear: Hearing, tympanic membrane, external ear and ear canal normal.  Mouth/Throat: Oropharynx is clear and moist.  Eyes: Pupils are equal, round, and reactive to light. EOM are normal.  Neck: Neck supple.  Cardiovascular: Normal rate, regular rhythm and normal heart sounds. Exam reveals no gallop and no friction rub.  No murmur heard. Pulmonary/Chest: Effort normal. She has wheezes. She has rhonchi. She has no rales.  Wheezing and ronchi resolved after albuterol treatment x 2  Lymphadenopathy:    She has no cervical adenopathy.  Neurological: She is alert and oriented to person,  place, and time.  Skin: Skin is warm and dry.    Results for orders placed or performed in visit on 12/10/17 (from the past 24 hour(s))  POCT CBC     Status: Abnormal   Collection Time: 12/10/17  4:38 PM  Result Value Ref Range   WBC 4.2 (A) 4.6 - 10.2 K/uL   Lymph, poc 1.5 0.6 - 3.4   POC LYMPH PERCENT 36.7 10 - 50 %L   MID (cbc) 0.4 0 - 0.9   POC MID % 8.4 0 - 12 %M   POC Granulocyte 2.3 2 - 6.9   Granulocyte percent 54.9 37 - 80 %G   RBC 4.19 4.04 - 5.48 M/uL   Hemoglobin 11.1 (A) 12.2 - 16.2 g/dL   HCT, POC 35.0 (A) 37.7 - 47.9 %   MCV 83.5 80 - 97 fL   MCH, POC 26.5 (A) 27 - 31.2 pg   MCHC 31.7 (A) 31.8 - 35.4 g/dL   RDW, POC 15.0 %   Platelet Count, POC 255 142 - 424 K/uL   MPV 7.5 0 - 99.8 fL    Dg Chest 2 View  Result Date: 12/10/2017 CLINICAL DATA:  Cough with shortness of breath and wheezing. EXAM: CHEST - 2 VIEW COMPARISON:  07/06/2017 FINDINGS: The lungs are clear without focal pneumonia, edema, pneumothorax or pleural effusion. The cardiopericardial silhouette is within normal limits for size. The visualized bony structures of the thorax are intact. Small to moderate hiatal hernia again noted. IMPRESSION: No acute cardiopulmonary findings. Small to moderate hiatal hernia. Electronically Signed   By: Misty Stanley M.D.   On: 12/10/2017 17:02  ASSESSMENT and PLAN  1. Cough - POCT CBC - DG Chest 2 View; Future  2. Wheezing - POCT CBC - DG Chest 2 View; Future - albuterol (PROVENTIL) (2.5 MG/3ML) 0.083% nebulizer solution 2.5 mg - albuterol (PROVENTIL) (2.5 MG/3ML) 0.083% nebulizer solution 2.5 mg  3. Fever, unspecified - POCT CBC - DG Chest 2 View; Future  4. Lower respiratory tract infection Discussed with patient that exam and studies are suggestive of viral bronchitis. Treating supportively. Given h/o recurrent PNA, rx for abx given in case symptoms progress over the weeknd. Instructions given. RTC precautions reviewed.   Other orders - Albuterol  Sulfate (PROAIR RESPICLICK) 449 (90 Base) MCG/ACT AEPB; Inhale 2 puffs into the lungs 4 (four) times daily as needed. - benzonatate (TESSALON) 100 MG capsule; Take 1-2 capsules (100-200 mg total) by mouth 2 (two) times daily as needed for cough. - doxycycline (VIBRA-TABS) 100 MG tablet; Take 1 tablet (100 mg total) by mouth 2 (two) times daily.  Return in about 1 week (around 12/17/2017), or if symptoms worsen or fail to improve.    Rutherford Guys, MD Primary Care at [place],  [address] Ph.  [phone] Fax [phone]

## 2017-12-10 NOTE — Patient Instructions (Addendum)
1. Your WBC is suggestive of viral bronchitis. Your xray is normal.  2. Please push fluids, rest, take prednisone in the morning with food, use albuterol as needed for wheezing and SOB and tessalon pearls as needed for cough. I recommend a use of humidifier at bedtime 3. I am sending a prescription for doxycycline (antibiotic). I do not think you need it, but the weekend is close. I would advise to start taking it if your fevers return, cough becomes productive, having increased shortness of breath, getting worse.     IF you received an x-ray today, you will receive an invoice from [facility]. Please contact [facility] at [phone] with questions or concerns regarding your invoice.   IF you received labwork today, you will receive an invoice from [facility]. Please contact [facility] at [phone] with questions or concerns regarding your invoice.   Our billing staff will not be able to assist you with questions regarding bills from these companies.  You will be contacted with the lab results as soon as they are available. The fastest way to get your results is to activate your My Chart account. Instructions are located on the last page of this paperwork. If you have not heard from [place] regarding the results in 2 weeks, please contact this office.

## 2017-12-15 ENCOUNTER — Ambulatory Visit: Payer: 59 | Admitting: Family Medicine

## 2017-12-15 ENCOUNTER — Encounter: Payer: Self-pay | Admitting: Family Medicine

## 2017-12-15 ENCOUNTER — Other Ambulatory Visit: Payer: Self-pay

## 2017-12-15 VITALS — BP 108/84 | HR 59 | Temp 98.6°F | Ht 64.96 in | Wt 259.0 lb

## 2017-12-15 DIAGNOSIS — J22 Unspecified acute lower respiratory infection: Secondary | ICD-10-CM

## 2017-12-15 DIAGNOSIS — R062 Wheezing: Secondary | ICD-10-CM

## 2017-12-15 MED ORDER — PREDNISONE 20 MG PO TABS: 40.0000 mg | ORAL_TABLET | Freq: Every day | ORAL | 0 refills | Status: DC

## 2017-12-15 MED FILL — predniSONE 20 MG TABS: 20 | 3 days supply | Qty: 6 | Fill #0

## 2017-12-15 NOTE — Progress Notes (Signed)
5/14/201912:07 PM  [name] 09/01/1955, 62 y.o. female [id number]  Chief Complaint  Patient presents with  . Follow-up    still having some cough and fatigue    HPI:   Patient is a 62 y.o. female with past medical history significant for HTN, HLP, OSA on cpap, dCHF, former smoker and seasonal allergies who presents today for follow-up on cough  Seen on 5/9 - low WBC and CXR She reports still feeling tired, cough mildly better, non productive, has not had any more fevers, therefore never started abx Completed pred yesterday Still needing albuterol TID due to continued wheezing   Fall Risk  12/15/2017 12/10/2017 10/28/2017 10/19/2017 07/20/2017  Falls in the past year? Yes No No No Yes  Number falls in past yr: 1 - - - 1  Injury with Fall? Yes - - - Yes  Comment wrist surgery - - - -     Depression screen [facility] 2/9 12/15/2017 12/10/2017 10/28/2017  Decreased Interest 0 0 1  Down, Depressed, Hopeless 0 0 1  PHQ - 2 Score 0 0 2  Altered sleeping - - -  Tired, decreased energy - - -  Change in appetite - - -  Feeling bad or failure about yourself  - - -  Trouble concentrating - - -  Moving slowly or fidgety/restless - - -  Suicidal thoughts - - -  PHQ-9 Score - - -  Difficult doing work/chores - - -  Some recent data might be hidden    Allergies  Allergen Reactions  . Sulfa Antibiotics Itching    Prior to Admission medications   Medication Sig Start Date End Date Taking? Authorizing Provider  Albuterol Sulfate (PROAIR RESPICLICK) 916 (90 Base) MCG/ACT AEPB Inhale 2 puffs into the lungs 4 (four) times daily as needed. 12/10/17  Yes Rutherford Guys, MD  atorvastatin (LIPITOR) 10 MG tablet Take 1 tablet (10 mg total) by mouth daily at 6 PM. 10/28/17  Yes Wardell Honour, MD  benzonatate (TESSALON) 100 MG capsule Take 1-2 capsules (100-200 mg total) by mouth 2 (two) times daily as needed for cough. 12/10/17  Yes Rutherford Guys, MD  butalbital-acetaminophen-caffeine  (FIORICET, ESGIC) [phone] MG tablet Take 1-2 tablets by mouth every 6 (six) hours as needed for headache. 04/21/17 04/21/18 Yes Wardell Honour, MD  cyclobenzaprine (FLEXERIL) 10 MG tablet Take 0.5-1 tablets (5-10 mg total) by mouth 3 (three) times daily as needed for muscle spasms. 04/21/17  Yes Wardell Honour, MD  darifenacin (ENABLEX) 15 MG 24 hr tablet Take 1 tablet (15 mg total) by mouth daily. 10/28/17  Yes Wardell Honour, MD  dexlansoprazole (DEXILANT) 60 MG capsule Take 1 capsule (60 mg total) by mouth daily. 10/28/17  Yes Wardell Honour, MD  docusate sodium (COLACE) 100 MG capsule TAKE 1 CAPSULE TWO TIMES DAILY 07/09/17  Yes [provider]  doxycycline (VIBRA-TABS) 100 MG tablet Take 1 tablet (100 mg total) by mouth 2 (two) times daily. 12/10/17  Yes Rutherford Guys, MD  DULoxetine (CYMBALTA) 60 MG capsule Take 2 capsules (120 mg total) by mouth daily. Patient taking differently: Take 60 mg by mouth daily.  02/17/17  Yes Wardell Honour, MD  furosemide (LASIX) 20 MG tablet Take 1-2 tablets (20-40 mg total) by mouth daily as needed. 07/20/17  Yes Wardell Honour, MD  gabapentin (NEURONTIN) 300 MG capsule Take 1-2 capsules (300-600 mg total) by mouth 4 (four) times daily. 10/28/17  Yes Wardell Honour,  MD  lisinopril (PRINIVIL,ZESTRIL) 20 MG tablet Take 1 tablet (20 mg total) by mouth daily. 10/28/17  Yes Wardell Honour, MD  mupirocin ointment (BACTROBAN) 2 % Apply 1 application topically 3 (three) times daily. 10/28/17  Yes Wardell Honour, MD  ondansetron (ZOFRAN-ODT) 4 MG disintegrating tablet DISSOLVE 1 TABLET BY MOUTH EVERY 8 HOURS AS NEEDED FOR NAUSEA OR VOMITING. 09/18/17  Yes Wardell Honour, MD  oxyCODONE-acetaminophen (PERCOCET/ROXICET) 5-325 MG tablet Take 1 tablet by mouth every 12 (twelve) hours as needed for severe pain. 10/19/17  Yes Wardell Honour, MD  potassium chloride SA (K-DUR,KLOR-CON) 20 MEQ tablet Take 1-2 tablets (20-40 mEq total) by mouth daily as needed. 07/20/17   Yes Wardell Honour, MD  predniSONE (DELTASONE) 20 MG tablet Take 2 tablets (40 mg total) by mouth daily with breakfast. 12/10/17  Yes Rutherford Guys, MD  propranolol (INDERAL) 60 MG tablet Take 1 tablet (60 mg total) 3 (three) times daily by mouth. 06/11/17  Yes Leonie Man, MD  ranitidine (ZANTAC) 150 MG tablet Take 1 tablet (150 mg total) by mouth at bedtime. 10/28/17  Yes Wardell Honour, MD  sertraline (ZOLOFT) 50 MG tablet Take 1 tablet (50 mg total) by mouth daily. 10/28/17  Yes Wardell Honour, MD  topiramate (TOPAMAX) 50 MG tablet Take 1 tablet (50 mg total) by mouth 2 (two) times daily. 07/20/17  Yes Wardell Honour, MD    Past Medical History:  Diagnosis Date  . Allergy    [name], Flonase  . Anemia   . Anxiety   . Arthritis   . Chronic kidney disease   . Colon polyps   . Depression   . Fibromyalgia   . Gastritis   . GERD (gastroesophageal reflux disease)   . Hernia, hiatal   . HLD (hyperlipidemia)   . Hypertension   . Migraine   . Neuropathy   . Osteoporosis   . Pneumonia   . Sleep apnea with use of continuous positive airway pressure (CPAP)   . Thyroid goiter     Past Surgical History:  Procedure Laterality Date  . ABDOMINAL HYSTERECTOMY     ovaries intact; DUB.  No dysplasia.  Marland Kitchen JOINT REPLACEMENT    . OPEN REDUCTION INTERNAL FIXATION (ORIF) DISTAL RADIAL FRACTURE Right 07/09/2017   Procedure: RIGHT WRIST OPEN REDUCTION INTERNAL FIXATION (ORIF) DISTAL RADIAL FRACTURE AND REPAIR AS INDICATED;  Surgeon: Iran Planas, MD;  Location: [place];  Service: Orthopedics;  Laterality: Right;  . PARTIAL HYSTERECTOMY    . ROTATOR CUFF REPAIR Right 2009  . SHOULDER SURGERY Right    shoulder dislocation, fall, and elbow unla nerve  . SHOULDER SURGERY Right 2015   labral tear  . TUBAL LIGATION    . ULNAR NERVE REPAIR Right 08/19/2014    Social History   Tobacco Use  . Smoking status: Former Smoker    Last attempt to quit: 08/04/1998    Years since quitting: 19.3  .  Smokeless tobacco: Never Used  Substance Use Topics  . Alcohol use: No    Family History  Problem Relation Age of Onset  . Heart disease Mother 53       AMI/CAD/CHF as cause of death  . Hypertension Mother   . Hyperlipidemia Mother   . Hypothyroidism Mother   . Depression Mother   . Gout Mother   . Heart disease Father 58       AMI  . Hypertension Father   . Stroke Father 58  mild CVA  . Prostate cancer Father   . Hyperlipidemia Father   . Cancer Father 69       prostate cancer  . Hypertension Brother   . Hyperlipidemia Brother   . Cancer Brother        prostate cancer  . Cancer Maternal Grandmother        type unknown  . Cancer Maternal Grandfather        type unknown  . Heart disease Paternal Grandmother   . Heart defect Paternal Grandfather   . Melanoma Brother   . Hyperlipidemia Brother   . Hypertension Brother   . Cancer Brother        melanoma  . Hypertension Brother   . Hyperlipidemia Brother   . Melanoma Brother   . Cancer Brother 25       melanoma  . Cancer Sister        Basal cell carcinoma scalp  . Depression Sister   . Hypertension Brother   . Depression Brother   . Hypertension Brother   . Gout Brother   . Arthritis Sister   . Depression Sister     ROS Per hpi  OBJECTIVE:  Blood pressure 108/84, pulse (!) 59, temperature 98.6 F (37 C), temperature source Oral, height 5' 4.96" (1.65 m), weight 259 lb (117.5 kg), SpO2 94 %.  Physical Exam  Constitutional: She is oriented to person, place, and time. She appears well-developed and well-nourished.  HENT:  Head: Normocephalic and atraumatic.  Mouth/Throat: Oropharynx is clear and moist. No oropharyngeal exudate.  Eyes: Pupils are equal, round, and reactive to light. EOM are normal. No scleral icterus.  Neck: Neck supple.  Cardiovascular: Normal rate, regular rhythm and normal heart sounds. Exam reveals no gallop and no friction rub.  No murmur heard. Pulmonary/Chest: Effort normal  and breath sounds normal. She has no wheezes. She has no rales.  Musculoskeletal: She exhibits no edema.  Neurological: She is alert and oriented to person, place, and time.  Skin: Skin is warm and dry.  Nursing note and vitals reviewed.    Peak flow reading is 350, about 91% of predicted, green zone  ASSESSMENT and PLAN  1. Lower respiratory tract infection 2. Wheezing Resolving, discussed cough can take upto 3 weeks to resolve. Continue with supportive measures and albuterol use as needed. RTC precautions given.  Other orders - Check Peak Flow  Return if symptoms worsen or fail to improve.    Rutherford Guys, MD Primary Care at [facility], [place] [address] Ph.  [phone] Fax [phone]

## 2017-12-15 NOTE — Patient Instructions (Signed)
     IF you received an x-ray today, you will receive an invoice from [facility]. Please contact [facility] at [phone] with questions or concerns regarding your invoice.   IF you received labwork today, you will receive an invoice from [facility]. Please contact [facility] at [phone] with questions or concerns regarding your invoice.   Our billing staff will not be able to assist you with questions regarding bills from these companies.  You will be contacted with the lab results as soon as they are available. The fastest way to get your results is to activate your My Chart account. Instructions are located on the last page of this paperwork. If you have not heard from us regarding the results in 2 weeks, please contact this office.     

## 2017-12-23 ENCOUNTER — Encounter: Payer: Self-pay | Admitting: Family Medicine

## 2017-12-28 ENCOUNTER — Encounter: Payer: Self-pay | Admitting: Family Medicine

## 2017-12-29 ENCOUNTER — Ambulatory Visit: Payer: 59 | Admitting: Family Medicine

## 2017-12-29 ENCOUNTER — Other Ambulatory Visit: Payer: Self-pay

## 2017-12-29 ENCOUNTER — Encounter: Payer: Self-pay | Admitting: Family Medicine

## 2017-12-29 VITALS — BP 112/84 | HR 71 | Temp 98.9°F | Ht 64.76 in | Wt 262.0 lb

## 2017-12-29 DIAGNOSIS — J22 Unspecified acute lower respiratory infection: Secondary | ICD-10-CM

## 2017-12-29 NOTE — Patient Instructions (Signed)
     IF you received an x-ray today, you will receive an invoice from [facility]. Please contact [facility] at [phone] with questions or concerns regarding your invoice.   IF you received labwork today, you will receive an invoice from [facility]. Please contact [facility] at [phone] with questions or concerns regarding your invoice.   Our billing staff will not be able to assist you with questions regarding bills from these companies.  You will be contacted with the lab results as soon as they are available. The fastest way to get your results is to activate your My Chart account. Instructions are located on the last page of this paperwork. If you have not heard from us regarding the results in 2 weeks, please contact this office.     

## 2017-12-29 NOTE — Progress Notes (Signed)
5/28/201911:47 AM  [name] 1956-04-25, 62 y.o. female [id number]  Chief Complaint  Patient presents with  . Follow-up    lower respiratory tract infection    HPI:   Patient is a 62 y.o. female with past medical history significant for HTN, HLP, OSA on cpap, dCHF, former smoker and seasonal allergies who presents today for followup  Seen on 5/9 and 5/14 - treated with abx and steroids Reports breathing back to baseline. Has not needed albuterol for past several days Cough back to her minimal baseline cough, not productive Has no acute concerns today  Fall Risk  12/29/2017 12/15/2017 12/10/2017 10/28/2017 10/19/2017  Falls in the past year? No Yes No No No  Number falls in past yr: - 1 - - -  Injury with Fall? - Yes - - -  Comment - wrist surgery - - -     Depression screen [facility] 2/9 12/29/2017 12/15/2017 12/10/2017  Decreased Interest 0 0 0  Down, Depressed, Hopeless 0 0 0  PHQ - 2 Score 0 0 0  Altered sleeping - - -  Tired, decreased energy - - -  Change in appetite - - -  Feeling bad or failure about yourself  - - -  Trouble concentrating - - -  Moving slowly or fidgety/restless - - -  Suicidal thoughts - - -  PHQ-9 Score - - -  Difficult doing work/chores - - -  Some recent data might be hidden    Allergies  Allergen Reactions  . Sulfa Antibiotics Itching    Prior to Admission medications   Medication Sig Start Date End Date Taking? Authorizing Provider  Albuterol Sulfate (PROAIR RESPICLICK) 938 (90 Base) MCG/ACT AEPB Inhale 2 puffs into the lungs 4 (four) times daily as needed. 12/10/17  Yes Rutherford Guys, MD  atorvastatin (LIPITOR) 10 MG tablet Take 1 tablet (10 mg total) by mouth daily at 6 PM. 10/28/17  Yes Wardell Honour, MD  butalbital-acetaminophen-caffeine (FIORICET, ESGIC) [phone] MG tablet Take 1-2 tablets by mouth every 6 (six) hours as needed for headache. 04/21/17 04/21/18 Yes Wardell Honour, MD  cyclobenzaprine (FLEXERIL) 10 MG tablet Take 0.5-1  tablets (5-10 mg total) by mouth 3 (three) times daily as needed for muscle spasms. 04/21/17  Yes Wardell Honour, MD  darifenacin (ENABLEX) 15 MG 24 hr tablet Take 1 tablet (15 mg total) by mouth daily. 10/28/17  Yes Wardell Honour, MD  dexlansoprazole (DEXILANT) 60 MG capsule Take 1 capsule (60 mg total) by mouth daily. 10/28/17  Yes Wardell Honour, MD  docusate sodium (COLACE) 100 MG capsule TAKE 1 CAPSULE TWO TIMES DAILY 07/09/17  Yes [provider]  DULoxetine (CYMBALTA) 60 MG capsule Take 2 capsules (120 mg total) by mouth daily. Patient taking differently: Take 60 mg by mouth daily.  02/17/17  Yes Wardell Honour, MD  furosemide (LASIX) 20 MG tablet Take 1-2 tablets (20-40 mg total) by mouth daily as needed. 07/20/17  Yes Wardell Honour, MD  gabapentin (NEURONTIN) 300 MG capsule Take 1-2 capsules (300-600 mg total) by mouth 4 (four) times daily. 10/28/17  Yes Wardell Honour, MD  lisinopril (PRINIVIL,ZESTRIL) 20 MG tablet Take 1 tablet (20 mg total) by mouth daily. 10/28/17  Yes Wardell Honour, MD  mupirocin ointment (BACTROBAN) 2 % Apply 1 application topically 3 (three) times daily. 10/28/17  Yes Wardell Honour, MD  ondansetron (ZOFRAN-ODT) 4 MG disintegrating tablet DISSOLVE 1 TABLET BY MOUTH EVERY 8 HOURS AS  NEEDED FOR NAUSEA OR VOMITING. 09/18/17  Yes Wardell Honour, MD  oxyCODONE-acetaminophen (PERCOCET/ROXICET) 5-325 MG tablet Take 1 tablet by mouth every 12 (twelve) hours as needed for severe pain. 10/19/17  Yes Wardell Honour, MD  potassium chloride SA (K-DUR,KLOR-CON) 20 MEQ tablet Take 1-2 tablets (20-40 mEq total) by mouth daily as needed. 07/20/17  Yes Wardell Honour, MD  propranolol (INDERAL) 60 MG tablet Take 1 tablet (60 mg total) 3 (three) times daily by mouth. 06/11/17  Yes Leonie Man, MD  ranitidine (ZANTAC) 150 MG tablet Take 1 tablet (150 mg total) by mouth at bedtime. 10/28/17  Yes Wardell Honour, MD  sertraline (ZOLOFT) 50 MG tablet Take 1 tablet (50 mg  total) by mouth daily. 10/28/17  Yes Wardell Honour, MD  topiramate (TOPAMAX) 50 MG tablet Take 1 tablet (50 mg total) by mouth 2 (two) times daily. 07/20/17  Yes Wardell Honour, MD    Past Medical History:  Diagnosis Date  . Allergy    [name], Flonase  . Anemia   . Anxiety   . Arthritis   . Chronic kidney disease   . Colon polyps   . Depression   . Fibromyalgia   . Gastritis   . GERD (gastroesophageal reflux disease)   . Hernia, hiatal   . HLD (hyperlipidemia)   . Hypertension   . Migraine   . Neuropathy   . Osteoporosis   . Pneumonia   . Sleep apnea with use of continuous positive airway pressure (CPAP)   . Thyroid goiter     Past Surgical History:  Procedure Laterality Date  . ABDOMINAL HYSTERECTOMY     ovaries intact; DUB.  No dysplasia.  Marland Kitchen JOINT REPLACEMENT    . OPEN REDUCTION INTERNAL FIXATION (ORIF) DISTAL RADIAL FRACTURE Right 07/09/2017   Procedure: RIGHT WRIST OPEN REDUCTION INTERNAL FIXATION (ORIF) DISTAL RADIAL FRACTURE AND REPAIR AS INDICATED;  Surgeon: Iran Planas, MD;  Location: [place];  Service: Orthopedics;  Laterality: Right;  . PARTIAL HYSTERECTOMY    . ROTATOR CUFF REPAIR Right 2009  . SHOULDER SURGERY Right    shoulder dislocation, fall, and elbow unla nerve  . SHOULDER SURGERY Right 2015   labral tear  . TUBAL LIGATION    . ULNAR NERVE REPAIR Right 08/19/2014    Social History   Tobacco Use  . Smoking status: Former Smoker    Last attempt to quit: 08/04/1998    Years since quitting: 19.4  . Smokeless tobacco: Never Used  Substance Use Topics  . Alcohol use: No    Family History  Problem Relation Age of Onset  . Heart disease Mother 49       AMI/CAD/CHF as cause of death  . Hypertension Mother   . Hyperlipidemia Mother   . Hypothyroidism Mother   . Depression Mother   . Gout Mother   . Heart disease Father 19       AMI  . Hypertension Father   . Stroke Father 6       mild CVA  . Prostate cancer Father   . Hyperlipidemia  Father   . Cancer Father 9       prostate cancer  . Hypertension Brother   . Hyperlipidemia Brother   . Cancer Brother        prostate cancer  . Cancer Maternal Grandmother        type unknown  . Cancer Maternal Grandfather        type unknown  .  Heart disease Paternal Grandmother   . Heart defect Paternal Grandfather   . Melanoma Brother   . Hyperlipidemia Brother   . Hypertension Brother   . Cancer Brother        melanoma  . Hypertension Brother   . Hyperlipidemia Brother   . Melanoma Brother   . Cancer Brother 25       melanoma  . Cancer Sister        Basal cell carcinoma scalp  . Depression Sister   . Hypertension Brother   . Depression Brother   . Hypertension Brother   . Gout Brother   . Arthritis Sister   . Depression Sister     Review of Systems  Constitutional: Negative for chills, fever and malaise/fatigue.  Respiratory: Positive for cough (minimal, back to baseline). Negative for sputum production, shortness of breath and wheezing.      OBJECTIVE:  Blood pressure 112/84, pulse 71, temperature 98.9 F (37.2 C), temperature source Oral, height 5' 4.76" (1.645 m), weight 262 lb (118.8 kg), SpO2 95 %.  Physical Exam  Constitutional: She is oriented to person, place, and time. She appears well-developed and well-nourished.  HENT:  Head: Normocephalic and atraumatic.  Mouth/Throat: Oropharynx is clear and moist. No oropharyngeal exudate.  Eyes: Pupils are equal, round, and reactive to light. EOM are normal. No scleral icterus.  Neck: Neck supple.  Cardiovascular: Normal rate, regular rhythm and normal heart sounds. Exam reveals no gallop and no friction rub.  No murmur heard. Pulmonary/Chest: Effort normal and breath sounds normal. She has no wheezes. She has no rales.  Musculoskeletal: She exhibits no edema.  Neurological: She is alert and oriented to person, place, and time.  Skin: Skin is warm and dry.  Psychiatric: She has a normal mood and affect.    Nursing note and vitals reviewed.   Peak flow reading is 350 about 94 % of predicted, green zone  ASSESSMENT and PLAN  1. Lower respiratory tract infection Resolved.  Other orders - Check Peak Flow  Return if symptoms worsen or fail to improve.    Rutherford Guys, MD Primary Care at [place], [place] [address] Ph.  [phone] Fax [phone]

## 2018-01-06 ENCOUNTER — Encounter: Payer: Self-pay | Admitting: Family Medicine

## 2018-01-14 ENCOUNTER — Other Ambulatory Visit: Payer: Self-pay | Admitting: Family Medicine

## 2018-01-14 MED FILL — ONDANSETRON ODT 4 MG TABLET: 4 | 7 days supply | Qty: 20 | Fill #3

## 2018-01-14 MED FILL — CYCLOBENZAPRINE 10 MG TAB: 10 | 60 days supply | Qty: 180 | Fill #0

## 2018-01-26 ENCOUNTER — Other Ambulatory Visit: Payer: Self-pay | Admitting: Family Medicine

## 2018-01-26 MED FILL — ATORVASTATIN 10 MG TABLET: 10 | 90 days supply | Qty: 90 | Fill #1

## 2018-01-26 MED FILL — ONDANSETRON ODT 4 MG TABLET: 4 | 7 days supply | Qty: 20 | Fill #4

## 2018-01-26 MED FILL — GABAPENTIN 300 MG CAPSULE: 300 | 68 days supply | Qty: 540 | Fill #1

## 2018-01-27 MED FILL — DULoxetine HCL 60 MG CPEP: 60 | 60 days supply | Qty: 60 | Fill #0

## 2018-01-27 MED FILL — TOPIRAMATE 50 MG TABLET: 50 | 90 days supply | Qty: 180 | Fill #0

## 2018-01-27 NOTE — Telephone Encounter (Signed)
Refill request for duloxetine 60 mg #60  with 1 refill and topiramate 50 mg #180 with 1 refill approved.  Pt last ov 10/28/17 Dgaddy, CMA

## 2018-01-29 NOTE — Progress Notes (Deleted)
Subjective:    Patient ID: [name], female    DOB: 1955/12/08, 62 y.o.   MRN: [id number]  02/03/2018  No chief complaint on file.    HPI This 62 y.o. female presents for evaluation of ***. BP Readings from Last 3 Encounters:  12/29/17 112/84  12/15/17 108/84  12/10/17 120/80   Wt Readings from Last 3 Encounters:  12/29/17 262 lb (118.8 kg)  12/15/17 259 lb (117.5 kg)  12/10/17 257 lb 12.8 oz (116.9 kg)   Immunization History  Administered Date(s) Administered  . Influenza,inj,Quad PF,6+ Mos 05/28/2015, 05/21/2016, 04/21/2017  . Tdap 06/25/2015  . Zoster Recombinat (Shingrix) 02/09/2017, 04/21/2017    Review of Systems  Past Medical History:  Diagnosis Date  . Allergy    [name], Flonase  . Anemia   . Anxiety   . Arthritis   . Chronic kidney disease   . Colon polyps   . Depression   . Fibromyalgia   . Gastritis   . GERD (gastroesophageal reflux disease)   . Hernia, hiatal   . HLD (hyperlipidemia)   . Hypertension   . Migraine   . Neuropathy   . Osteoporosis   . Pneumonia   . Sleep apnea with use of continuous positive airway pressure (CPAP)   . Thyroid goiter    Past Surgical History:  Procedure Laterality Date  . ABDOMINAL HYSTERECTOMY     ovaries intact; DUB.  No dysplasia.  Marland Kitchen JOINT REPLACEMENT    . OPEN REDUCTION INTERNAL FIXATION (ORIF) DISTAL RADIAL FRACTURE Right 07/09/2017   Procedure: RIGHT WRIST OPEN REDUCTION INTERNAL FIXATION (ORIF) DISTAL RADIAL FRACTURE AND REPAIR AS INDICATED;  Surgeon: Iran Planas, MD;  Location: [facility];  Service: Orthopedics;  Laterality: Right;  . PARTIAL HYSTERECTOMY    . ROTATOR CUFF REPAIR Right 2009  . SHOULDER SURGERY Right    shoulder dislocation, fall, and elbow unla nerve  . SHOULDER SURGERY Right 2015   labral tear  . TUBAL LIGATION    . ULNAR NERVE REPAIR Right 08/19/2014   Allergies  Allergen Reactions  . Sulfa Antibiotics Itching   Current Outpatient Medications on File Prior to Visit    Medication Sig Dispense Refill  . Albuterol Sulfate (PROAIR RESPICLICK) 301 (90 Base) MCG/ACT AEPB Inhale 2 puffs into the lungs 4 (four) times daily as needed. 1 each 1  . atorvastatin (LIPITOR) 10 MG tablet Take 1 tablet (10 mg total) by mouth daily at 6 PM. 90 tablet 3  . butalbital-acetaminophen-caffeine (FIORICET, ESGIC) 50-325-40 MG tablet Take 1-2 tablets by mouth every 6 (six) hours as needed for headache. 40 tablet 0  . cyclobenzaprine (FLEXERIL) 10 MG tablet TAKE 1/2-1 TABLET BY MOUTH 3 TIMES DAILY AS NEEDED FOR MUSCLE SPASMS. 180 tablet 1  . darifenacin (ENABLEX) 15 MG 24 hr tablet Take 1 tablet (15 mg total) by mouth daily. 90 tablet 3  . dexlansoprazole (DEXILANT) 60 MG capsule Take 1 capsule (60 mg total) by mouth daily. 90 capsule 3  . docusate sodium (COLACE) 100 MG capsule TAKE 1 CAPSULE TWO TIMES DAILY  0  . DULoxetine (CYMBALTA) 60 MG capsule Take 1 capsule (60 mg total) by mouth daily. 60 capsule 1  . furosemide (LASIX) 20 MG tablet Take 1-2 tablets (20-40 mg total) by mouth daily as needed. 30 tablet 1  . gabapentin (NEURONTIN) 300 MG capsule Take 1-2 capsules (300-600 mg total) by mouth 4 (four) times daily. 540 capsule 1  . lisinopril (PRINIVIL,ZESTRIL) 20 MG tablet Take 1 tablet (20  mg total) by mouth daily. 90 tablet 3  . mupirocin ointment (BACTROBAN) 2 % Apply 1 application topically 3 (three) times daily. 22 g 1  . ondansetron (ZOFRAN-ODT) 4 MG disintegrating tablet DISSOLVE 1 TABLET BY MOUTH EVERY 8 HOURS AS NEEDED FOR NAUSEA OR VOMITING. 20 tablet 4  . oxyCODONE-acetaminophen (PERCOCET/ROXICET) 5-325 MG tablet Take 1 tablet by mouth every 12 (twelve) hours as needed for severe pain. 30 tablet 0  . potassium chloride SA (K-DUR,KLOR-CON) 20 MEQ tablet Take 1-2 tablets (20-40 mEq total) by mouth daily as needed. 30 tablet 1  . propranolol (INDERAL) 60 MG tablet Take 1 tablet (60 mg total) 3 (three) times daily by mouth. 270 tablet 3  . ranitidine (ZANTAC) 150 MG tablet  Take 1 tablet (150 mg total) by mouth at bedtime. 90 tablet 3  . sertraline (ZOLOFT) 50 MG tablet Take 1 tablet (50 mg total) by mouth daily. 90 tablet 1  . topiramate (TOPAMAX) 50 MG tablet TAKE 1 TABLET (50 MG TOTAL) BY MOUTH 2 (TWO) TIMES DAILY. 180 tablet 1   No current facility-administered medications on file prior to visit.    Social History   Socioeconomic History  . Marital status: Married    Spouse name: Not on file  . Number of children: 3  . Years of education: Not on file  . Highest education level: Not on file  Occupational History  . Occupation: unemployed  Social Needs  . Financial resource strain: Not on file  . Food insecurity:    Worry: Not on file    Inability: Not on file  . Transportation needs:    Medical: Not on file    Non-medical: Not on file  Tobacco Use  . Smoking status: Former Smoker    Last attempt to quit: 08/04/1998    Years since quitting: 19.5  . Smokeless tobacco: Never Used  Substance and Sexual Activity  . Alcohol use: No  . Drug use: No  . Sexual activity: Yes    Birth control/protection: Surgical  Lifestyle  . Physical activity:    Days per week: Not on file    Minutes per session: Not on file  . Stress: Not on file  Relationships  . Social connections:    Talks on phone: Not on file    Gets together: Not on file    Attends religious service: Not on file    Active member of club or organization: Not on file    Attends meetings of clubs or organizations: Not on file    Relationship status: Not on file  . Intimate partner violence:    Fear of current or ex partner: Not on file    Emotionally abused: Not on file    Physically abused: Not on file    Forced sexual activity: Not on file  Other Topics Concern  . Not on file  Social History Narrative   Marital status: married x 19 years; moderately happy     Children: 3 children ([place], [place], 39 April); 8 grandchildren; 0 gg     Lives: with husband/Steve, April, 2  granddaughters     Employment:  Unemployed; quit working 2015 with work related injury; awaiting disability in 2018; disability approved in 02/2017.       Tobacco: quit smoking in 2016; smoked x 2 years.      Alcohol: none      Drugs: none      Exercise: rarely in 2018  Family History  Problem Relation Age of Onset  . Heart disease Mother 30       AMI/CAD/CHF as cause of death  . Hypertension Mother   . Hyperlipidemia Mother   . Hypothyroidism Mother   . Depression Mother   . Gout Mother   . Heart disease Father 68       AMI  . Hypertension Father   . Stroke Father 51       mild CVA  . Prostate cancer Father   . Hyperlipidemia Father   . Cancer Father 49       prostate cancer  . Hypertension Brother   . Hyperlipidemia Brother   . Cancer Brother        prostate cancer  . Cancer Maternal Grandmother        type unknown  . Cancer Maternal Grandfather        type unknown  . Heart disease Paternal Grandmother   . Heart defect Paternal Grandfather   . Melanoma Brother   . Hyperlipidemia Brother   . Hypertension Brother   . Cancer Brother        melanoma  . Hypertension Brother   . Hyperlipidemia Brother   . Melanoma Brother   . Cancer Brother 25       melanoma  . Cancer Sister        Basal cell carcinoma scalp  . Depression Sister   . Hypertension Brother   . Depression Brother   . Hypertension Brother   . Gout Brother   . Arthritis Sister   . Depression Sister        Objective:    There were no vitals taken for this visit. Physical Exam No results found. Depression screen [facility] 2/9 12/29/2017 12/15/2017 12/10/2017 10/28/2017 10/19/2017  Decreased Interest 0 0 0 1 3  Down, Depressed, Hopeless 0 0 0 1 3  PHQ - 2 Score 0 0 0 2 6  Altered sleeping - - - - 1  Tired, decreased energy - - - - 1  Change in appetite - - - - 0  Feeling bad or failure about yourself  - - - - 1  Trouble concentrating - - - - 0  Moving slowly or fidgety/restless - - - - 0    Suicidal thoughts - - - - 0  PHQ-9 Score - - - - 9  Difficult doing work/chores - - - - -  Some recent data might be hidden   Fall Risk  12/29/2017 12/15/2017 12/10/2017 10/28/2017 10/19/2017  Falls in the past year? No Yes No No No  Number falls in past yr: - 1 - - -  Injury with Fall? - Yes - - -  Comment - wrist surgery - - -        Assessment & Plan:   1. Essential hypertension   2. Migraine without aura and without status migrainosus, not intractable   3. OSA (obstructive sleep apnea)   4. Gastroesophageal reflux disease without esophagitis   5. Chronic kidney disease, unspecified CKD stage   6. Adjustment disorder with mixed anxiety and depressed mood   7. Fibromyalgia   8. Pure hypercholesterolemia     ***  No orders of the defined types were placed in this encounter.  No orders of the defined types were placed in this encounter.   No follow-ups on file.   Kristi Elayne Guerin, M.D. Primary Care at [facility] [address]  [place], [place]  [address] [phone] phone [phone] fax

## 2018-02-03 ENCOUNTER — Encounter: Payer: 59 | Admitting: Family Medicine

## 2018-02-07 ENCOUNTER — Encounter: Payer: Self-pay | Admitting: Family Medicine

## 2018-02-15 MED FILL — raNITIdine HCL 150 MG TABS: 150 | 90 days supply | Qty: 90 | Fill #1

## 2018-02-15 MED FILL — LISINOPRIL 20 MG TABLET: 20 | 90 days supply | Qty: 90 | Fill #1

## 2018-02-15 MED FILL — PROPRANOLOL 60 MG TABLET: 60 | 90 days supply | Qty: 270 | Fill #2

## 2018-02-15 MED FILL — DARIFENACIN ER 15 MG TABLET: 15 | 90 days supply | Qty: 90 | Fill #1

## 2018-02-19 ENCOUNTER — Ambulatory Visit (INDEPENDENT_AMBULATORY_CARE_PROVIDER_SITE_OTHER): Payer: 59 | Admitting: Family Medicine

## 2018-02-19 ENCOUNTER — Other Ambulatory Visit: Payer: Self-pay

## 2018-02-19 ENCOUNTER — Encounter: Payer: Self-pay | Admitting: Family Medicine

## 2018-02-19 VITALS — BP 116/72 | HR 77 | Temp 99.0°F | Resp 16 | Ht 64.96 in | Wt 263.0 lb

## 2018-02-19 DIAGNOSIS — R0609 Other forms of dyspnea: Secondary | ICD-10-CM | POA: Diagnosis not present

## 2018-02-19 DIAGNOSIS — E7800 Pure hypercholesterolemia, unspecified: Secondary | ICD-10-CM

## 2018-02-19 DIAGNOSIS — I10 Essential (primary) hypertension: Secondary | ICD-10-CM

## 2018-02-19 DIAGNOSIS — G894 Chronic pain syndrome: Secondary | ICD-10-CM

## 2018-02-19 DIAGNOSIS — K219 Gastro-esophageal reflux disease without esophagitis: Secondary | ICD-10-CM | POA: Diagnosis not present

## 2018-02-19 DIAGNOSIS — Z1231 Encounter for screening mammogram for malignant neoplasm of breast: Secondary | ICD-10-CM | POA: Diagnosis not present

## 2018-02-19 DIAGNOSIS — N189 Chronic kidney disease, unspecified: Secondary | ICD-10-CM

## 2018-02-19 DIAGNOSIS — Z0000 Encounter for general adult medical examination without abnormal findings: Secondary | ICD-10-CM | POA: Diagnosis not present

## 2018-02-19 DIAGNOSIS — Z6841 Body Mass Index (BMI) 40.0 and over, adult: Secondary | ICD-10-CM

## 2018-02-19 DIAGNOSIS — G4733 Obstructive sleep apnea (adult) (pediatric): Secondary | ICD-10-CM

## 2018-02-19 DIAGNOSIS — M797 Fibromyalgia: Secondary | ICD-10-CM

## 2018-02-19 DIAGNOSIS — J9801 Acute bronchospasm: Secondary | ICD-10-CM | POA: Diagnosis not present

## 2018-02-19 DIAGNOSIS — F4323 Adjustment disorder with mixed anxiety and depressed mood: Secondary | ICD-10-CM | POA: Diagnosis not present

## 2018-02-19 DIAGNOSIS — Z131 Encounter for screening for diabetes mellitus: Secondary | ICD-10-CM | POA: Diagnosis not present

## 2018-02-19 DIAGNOSIS — R0602 Shortness of breath: Secondary | ICD-10-CM

## 2018-02-19 DIAGNOSIS — G43009 Migraine without aura, not intractable, without status migrainosus: Secondary | ICD-10-CM

## 2018-02-19 LAB — POCT URINALYSIS DIP (MANUAL ENTRY)
BILIRUBIN UA: NEGATIVE
BILIRUBIN UA: NEGATIVE mg/dL
Blood, UA: NEGATIVE
GLUCOSE UA: NEGATIVE mg/dL
Nitrite, UA: NEGATIVE
SPEC GRAV UA: 1.015 (ref 1.010–1.025)
Urobilinogen, UA: 0.2 E.U./dL
pH, UA: 7.5 (ref 5.0–8.0)

## 2018-02-19 NOTE — Progress Notes (Signed)
I called [facility] in [facility], spoke with medical records and they are going to send [name]'s last colonoscopy report.

## 2018-02-19 NOTE — Patient Instructions (Addendum)
We recommend that you schedule a mammogram for breast cancer screening. Typically, you do not need a referral to do this. Please contact a local imaging center to schedule your mammogram.  [facility] - [phone]  *ask for the Radiology Department [facility]) - [phone] or [phone]  [facility] - [phone] [facility] - [phone] [facility] - [phone]  *ask for the Radiology Department [facility] - [phone]  *ask for the Radiology Department [facility] - [phone]  *ask for the Mammography Department [facility] - [phone]   IF you received an x-ray today, you will receive an invoice from [facility]. Please contact [facility] at [phone] with questions or concerns regarding your invoice.   IF you received labwork today, you will receive an invoice from [facility]. Please contact [facility] at [phone] with questions or concerns regarding your invoice.   Our billing staff will not be able to assist you with questions regarding bills from these companies.  You will be contacted with the lab results as soon as they are available. The fastest way to get your results is to activate your My Chart account. Instructions are located on the last page of this paperwork. If you have not heard from us regarding the results in 2 weeks, please contact this office.     Preventive Care 40-64 Years, Female Preventive care refers to lifestyle choices and visits with your health care provider that can promote health and wellness. What does preventive care include?  A yearly physical exam. This is also called an annual well check.  Dental exams once or twice a year.  Routine eye exams. Ask your health care provider how often you should have your eyes checked.  Personal lifestyle choices, including: ? Daily care of your teeth and  gums. ? Regular physical activity. ? Eating a healthy diet. ? Avoiding tobacco and drug use. ? Limiting alcohol use. ? Practicing safe sex. ? Taking low-dose aspirin daily starting at age 50. ? Taking vitamin and mineral supplements as recommended by your health care provider. What happens during an annual well check? The services and screenings done by your health care provider during your annual well check will depend on your age, overall health, lifestyle risk factors, and family history of disease. Counseling Your health care provider may ask you questions about your:  Alcohol use.  Tobacco use.  Drug use.  Emotional well-being.  Home and relationship well-being.  Sexual activity.  Eating habits.  Work and work environment.  Method of birth control.  Menstrual cycle.  Pregnancy history.  Screening You may have the following tests or measurements:  Height, weight, and BMI.  Blood pressure.  Lipid and cholesterol levels. These may be checked every 5 years, or more frequently if you are over 50 years old.  Skin check.  Lung cancer screening. You may have this screening every year starting at age 55 if you have a 30-pack-year history of smoking and currently smoke or have quit within the past 15 years.  Fecal occult blood test (FOBT) of the stool. You may have this test every year starting at age 50.  Flexible sigmoidoscopy or colonoscopy. You may have a sigmoidoscopy every 5 years or a colonoscopy every 10 years starting at age 50.  Hepatitis C blood test.  Hepatitis B blood test.  Sexually transmitted disease (STD) testing.  Diabetes screening. This is done by checking your   blood sugar (glucose) after you have not eaten for a while (fasting). You may have this done every 1-3 years.  Mammogram. This may be done every 1-2 years. Talk to your health care provider about when you should start having regular mammograms. This may depend on whether you have a  family history of breast cancer.  BRCA-related cancer screening. This may be done if you have a family history of breast, ovarian, tubal, or peritoneal cancers.  Pelvic exam and Pap test. This may be done every 3 years starting at age 21. Starting at age 30, this may be done every 5 years if you have a Pap test in combination with an HPV test.  Bone density scan. This is done to screen for osteoporosis. You may have this scan if you are at high risk for osteoporosis.  Discuss your test results, treatment options, and if necessary, the need for more tests with your health care provider. Vaccines Your health care provider may recommend certain vaccines, such as:  Influenza vaccine. This is recommended every year.  Tetanus, diphtheria, and acellular pertussis (Tdap, Td) vaccine. You may need a Td booster every 10 years.  Varicella vaccine. You may need this if you have not been vaccinated.  Zoster vaccine. You may need this after age 60.  Measles, mumps, and rubella (MMR) vaccine. You may need at least one dose of MMR if you were born in 1957 or later. You may also need a second dose.  Pneumococcal 13-valent conjugate (PCV13) vaccine. You may need this if you have certain conditions and were not previously vaccinated.  Pneumococcal polysaccharide (PPSV23) vaccine. You may need one or two doses if you smoke cigarettes or if you have certain conditions.  Meningococcal vaccine. You may need this if you have certain conditions.  Hepatitis A vaccine. You may need this if you have certain conditions or if you travel or work in places where you may be exposed to hepatitis A.  Hepatitis B vaccine. You may need this if you have certain conditions or if you travel or work in places where you may be exposed to hepatitis B.  Haemophilus influenzae type b (Hib) vaccine. You may need this if you have certain conditions.  Talk to your health care provider about which screenings and vaccines you need  and how often you need them. This information is not intended to replace advice given to you by your health care provider. Make sure you discuss any questions you have with your health care provider. Document Released: 08/17/2015 Document Revised: 04/09/2016 Document Reviewed: 05/22/2015 Elsevier Interactive Patient Education  2018 [employer]  

## 2018-02-19 NOTE — Progress Notes (Signed)
Subjective:    Patient ID: [name], female    DOB: 09/19/1955, 62 y.o.   MRN: [id number]  02/19/2018  Annual Exam    HPI This 62 y.o. female presents for COMPLETE PHYSICAL EXAMINATION.  Last physical:  01-09-2017 Pap smear: hysterectomy; uterine enlargement and DUB; no cervical dysplasia; ovaries remaining. Mammogram:   07-2015   Colonoscopy:  Ferdinand Lango at [facility] at [facility] Screening   Right eye Left eye Both eyes  Without correction:     With correction: 20/20 20/20 20/20     BP Readings from Last 3 Encounters:  02/19/18 116/72  12/29/17 112/84  12/15/17 108/84   Wt Readings from Last 3 Encounters:  02/19/18 263 lb (119.3 kg)  12/29/17 262 lb (118.8 kg)  12/15/17 259 lb (117.5 kg)   Immunization History  Administered Date(s) Administered  . Influenza,inj,Quad PF,6+ Mos 05/28/2015, 05/21/2016, 04/21/2017  . Tdap 06/25/2015  . Zoster Recombinat (Shingrix) 02/09/2017, 04/21/2017   Health Maintenance  Topic Date Due  . MAMMOGRAM  07/15/2017  . COLONOSCOPY  12/16/2018 (Originally 02/24/2006)  . INFLUENZA VACCINE  03/04/2018  . TETANUS/TDAP  06/24/2025  . Hepatitis C Screening  Completed  . HIV Screening  Completed   DYSPNEA:  A lot more inactive with two falls and injuries.  Dr. Pamella Pert suggested adult onset asthma.  Very SOB when went to asthma; sounded like a wet wheeze.  S/p cardiac evaluation; negative work up.  SO[place].  Having R calf pain and radiating to posterior knee.  No chest pain.  Felt like a knot R lateral leg.  Radiates to R posterior thigh.  Onset of R calf pain one month ago; chronic swelling of legs. S/p Holter monitor and echo; negative.  Has been using Albuterol every day several times per week with improvement.  Granddaughter has asthma.   If throat feels congested.  Was wheezing excessively with acute illness in May 2019.  Dizziness has improved.  OSA with CPAP: Patient reports good compliance with  medication, good tolerance to medication, and good symptom control.    Review of Systems  Constitutional: Positive for fatigue. Negative for activity change, appetite change, chills, diaphoresis, fever and unexpected weight change.  HENT: Positive for postnasal drip, rhinorrhea and voice change. Negative for congestion, dental problem, drooling, ear discharge, ear pain, facial swelling, hearing loss, mouth sores, nosebleeds, sinus pressure, sneezing, sore throat, tinnitus and trouble swallowing.   Eyes: Negative for photophobia, pain, discharge, redness, itching and visual disturbance.  Respiratory: Positive for cough, shortness of breath and wheezing. Negative for apnea, choking, chest tightness and stridor.   Cardiovascular: Positive for palpitations and leg swelling. Negative for chest pain.  Gastrointestinal: Positive for constipation and nausea. Negative for abdominal distention, abdominal pain, anal bleeding, blood in stool, diarrhea, rectal pain and vomiting.  Endocrine: Negative for cold intolerance, heat intolerance, polydipsia, polyphagia and polyuria.  Genitourinary: Negative for decreased urine volume, difficulty urinating, dyspareunia, dysuria, enuresis, flank pain, frequency, genital sores, hematuria, menstrual problem, pelvic pain, urgency, vaginal bleeding, vaginal discharge and vaginal pain.       Nocturia x 2.  Urinary leakage yes; wears pad.  Changes pad once per day.  Musculoskeletal: Negative for arthralgias, back pain, gait problem, joint swelling, myalgias, neck pain and neck stiffness.  Skin: Negative for color change, pallor, rash and wound.  Allergic/Immunologic: Positive for environmental allergies. Negative for food allergies and immunocompromised state.  Neurological: Positive for dizziness, light-headedness, numbness and headaches.  Negative for tremors, seizures, syncope, facial asymmetry, speech difficulty and weakness.  Hematological: Negative for adenopathy. Does not  bruise/bleed easily.  Psychiatric/Behavioral: Negative for agitation, behavioral problems, confusion, decreased concentration, dysphoric mood, hallucinations, self-injury, sleep disturbance and suicidal ideas. The patient is not nervous/anxious and is not hyperactive.        Bedtime 11-1; wakes up 11-12.    Past Medical History:  Diagnosis Date  . Allergy    [name], Flonase  . Anemia   . Anxiety   . Arthritis   . Chronic kidney disease   . Colon polyps   . Depression   . Fibromyalgia   . Gastritis   . GERD (gastroesophageal reflux disease)   . Hernia, hiatal   . HLD (hyperlipidemia)   . Hypertension   . Migraine   . Neuropathy   . Osteoporosis   . Pneumonia   . Sleep apnea with use of continuous positive airway pressure (CPAP)   . Thyroid goiter    Past Surgical History:  Procedure Laterality Date  . ABDOMINAL HYSTERECTOMY     ovaries intact; DUB.  No dysplasia.  Marland Kitchen JOINT REPLACEMENT    . OPEN REDUCTION INTERNAL FIXATION (ORIF) DISTAL RADIAL FRACTURE Right 07/09/2017   Procedure: RIGHT WRIST OPEN REDUCTION INTERNAL FIXATION (ORIF) DISTAL RADIAL FRACTURE AND REPAIR AS INDICATED;  Surgeon: Iran Planas, MD;  Location: [place];  Service: Orthopedics;  Laterality: Right;  . PARTIAL HYSTERECTOMY    . ROTATOR CUFF REPAIR Right 2009  . SHOULDER SURGERY Right    shoulder dislocation, fall, and elbow unla nerve  . SHOULDER SURGERY Right 2015   labral tear  . TUBAL LIGATION    . ULNAR NERVE REPAIR Right 08/19/2014   Allergies  Allergen Reactions  . Sulfa Antibiotics Itching   Current Outpatient Medications on File Prior to Visit  Medication Sig Dispense Refill  . Albuterol Sulfate (PROAIR RESPICLICK) 938 (90 Base) MCG/ACT AEPB Inhale 2 puffs into the lungs 4 (four) times daily as needed. 1 each 1  . atorvastatin (LIPITOR) 10 MG tablet Take 1 tablet (10 mg total) by mouth daily at 6 PM. 90 tablet 3  . butalbital-acetaminophen-caffeine (FIORICET, ESGIC) 50-325-40 MG tablet Take  1-2 tablets by mouth every 6 (six) hours as needed for headache. 40 tablet 0  . cyclobenzaprine (FLEXERIL) 10 MG tablet TAKE 1/2-1 TABLET BY MOUTH 3 TIMES DAILY AS NEEDED FOR MUSCLE SPASMS. 180 tablet 1  . darifenacin (ENABLEX) 15 MG 24 hr tablet Take 1 tablet (15 mg total) by mouth daily. 90 tablet 3  . dexlansoprazole (DEXILANT) 60 MG capsule Take 1 capsule (60 mg total) by mouth daily. 90 capsule 3  . docusate sodium (COLACE) 100 MG capsule TAKE 1 CAPSULE TWO TIMES DAILY  0  . lisinopril (PRINIVIL,ZESTRIL) 20 MG tablet Take 1 tablet (20 mg total) by mouth daily. 90 tablet 3  . oxyCODONE-acetaminophen (PERCOCET/ROXICET) 5-325 MG tablet Take 1 tablet by mouth every 12 (twelve) hours as needed for severe pain. 30 tablet 0  . propranolol (INDERAL) 60 MG tablet Take 1 tablet (60 mg total) 3 (three) times daily by mouth. 270 tablet 3  . ranitidine (ZANTAC) 150 MG tablet Take 1 tablet (150 mg total) by mouth at bedtime. 90 tablet 3  . topiramate (TOPAMAX) 50 MG tablet TAKE 1 TABLET (50 MG TOTAL) BY MOUTH 2 (TWO) TIMES DAILY. 180 tablet 1   No current facility-administered medications on file prior to visit.    Social History   Socioeconomic History  . Marital  status: Married    Spouse name: Not on file  . Number of children: 3  . Years of education: Not on file  . Highest education level: Not on file  Occupational History  . Occupation: unemployed  Social Needs  . Financial resource strain: Not on file  . Food insecurity:    Worry: Not on file    Inability: Not on file  . Transportation needs:    Medical: Not on file    Non-medical: Not on file  Tobacco Use  . Smoking status: Former Smoker    Last attempt to quit: 08/04/1998    Years since quitting: 19.5  . Smokeless tobacco: Never Used  Substance and Sexual Activity  . Alcohol use: No  . Drug use: No  . Sexual activity: Yes    Birth control/protection: Surgical, Post-menopausal  Lifestyle  . Physical activity:    Days per week:  Not on file    Minutes per session: Not on file  . Stress: Not on file  Relationships  . Social connections:    Talks on phone: Not on file    Gets together: Not on file    Attends religious service: Not on file    Active member of club or organization: Not on file    Attends meetings of clubs or organizations: Not on file    Relationship status: Not on file  . Intimate partner violence:    Fear of current or ex partner: Not on file    Emotionally abused: Not on file    Physically abused: Not on file    Forced sexual activity: Not on file  Other Topics Concern  . Not on file  Social History Narrative   Marital status: married x 20 years; moderately happy     Children: 3 children (47 [name], 46 Dianna, 40 April); 8 grandchildren; 0 gg     Lives: with husband/Steve, April, 2 granddaughters     Employment:  Unemployed; quit working 2015 with work related injury; awaiting disability in 2018; disability approved in 02/2017.       Tobacco: quit smoking in 2016; smoked x 2 years.      Alcohol: none      Drugs: none      Exercise: rarely in 2018         Family History  Problem Relation Age of Onset  . Heart disease Mother 70       AMI/CAD/CHF as cause of death  . Hypertension Mother   . Hyperlipidemia Mother   . Hypothyroidism Mother   . Depression Mother   . Gout Mother   . Heart disease Father 24       AMI  . Hypertension Father   . Stroke Father 40       mild CVA  . Prostate cancer Father   . Hyperlipidemia Father   . Cancer Father 74       prostate cancer  . Hypertension Brother   . Hyperlipidemia Brother   . Cancer Brother        prostate cancer  . Cancer Maternal Grandmother        type unknown  . Cancer Maternal Grandfather        type unknown  . Heart disease Paternal Grandmother   . Heart defect Paternal Grandfather   . Melanoma Brother   . Hyperlipidemia Brother   . Hypertension Brother   . Cancer Brother        melanoma  . Hypertension Brother   .  Hyperlipidemia Brother   . Melanoma Brother   . Cancer Brother 25       melanoma  . Cancer Sister        Basal cell carcinoma scalp  . Depression Sister   . Hypertension Brother   . Depression Brother   . Hypertension Brother   . Gout Brother   . Arthritis Sister   . Depression Sister        Objective:    BP 116/72   Pulse 77   Temp 99 F (37.2 C) (Oral)   Resp 16   Ht 5' 4.96" (1.65 m)   Wt 263 lb (119.3 kg)   SpO2 99%   BMI 43.82 kg/m  Physical Exam  Constitutional: She is oriented to person, place, and time. She appears well-developed and well-nourished. No distress.  HENT:  Head: Normocephalic and atraumatic.  Right Ear: External ear normal.  Left Ear: External ear normal.  Nose: Nose normal.  Mouth/Throat: Oropharynx is clear and moist.  Eyes: Pupils are equal, round, and reactive to light. Conjunctivae and EOM are normal.  Neck: Normal range of motion and full passive range of motion without pain. Neck supple. No JVD present. Carotid bruit is not present. No thyromegaly present.  Cardiovascular: Normal rate, regular rhythm and normal heart sounds. Exam reveals no gallop and no friction rub.  No murmur heard. Pulmonary/Chest: Effort normal and breath sounds normal. She has no wheezes. She has no rales.  Abdominal: Soft. Bowel sounds are normal. She exhibits no distension and no mass. There is no tenderness. There is no rebound and no guarding.  Musculoskeletal:       Right shoulder: Normal.       Left shoulder: Normal.       Cervical back: Normal.  Lymphadenopathy:    She has no cervical adenopathy.  Neurological: She is alert and oriented to person, place, and time. She has normal reflexes. No cranial nerve deficit. She exhibits normal muscle tone. Coordination normal.  Skin: Skin is warm and dry. No rash noted. She is not diaphoretic. No erythema. No pallor.  Psychiatric: She has a normal mood and affect. Her behavior is normal. Judgment and thought content  normal.  Nursing note and vitals reviewed.  No results found. Depression screen [facility] 2/9 02/19/2018 12/29/2017 12/15/2017 12/10/2017 10/28/2017  Decreased Interest 0 0 0 0 1  Down, Depressed, Hopeless 0 0 0 0 1  PHQ - 2 Score 0 0 0 0 2  Altered sleeping - - - - -  Tired, decreased energy - - - - -  Change in appetite - - - - -  Feeling bad or failure about yourself  - - - - -  Trouble concentrating - - - - -  Moving slowly or fidgety/restless - - - - -  Suicidal thoughts - - - - -  PHQ-9 Score - - - - -  Difficult doing work/chores - - - - -  Some recent data might be hidden   Fall Risk  02/19/2018 12/29/2017 12/15/2017 12/10/2017 10/28/2017  Falls in the past year? No No Yes No No  Number falls in past yr: - - 1 - -  Injury with Fall? - - Yes - -  Comment - - wrist surgery - -        Assessment & Plan:   1. Routine physical examination   2. Essential hypertension   3. Migraine without aura and without status migrainosus, not intractable   4. OSA (obstructive  sleep apnea)   5. Gastroesophageal reflux disease without esophagitis   6. Chronic kidney disease, unspecified CKD stage   7. Adjustment disorder with mixed anxiety and depressed mood   8. Chronic pain syndrome   9. Class 3 severe obesity due to excess calories with serious comorbidity and body mass index (BMI) of 40.0 to 44.9 in adult ([place])   10. Fibromyalgia   11. Pure hypercholesterolemia   12. Encounter for screening mammogram for breast cancer   13. Screening for diabetes mellitus   14. Dyspnea on exertion   15. Bronchospasm   16. Shortness of breath     -anticipatory guidance provided --- exercise, weight loss, safe driving practices, aspirin 81mg  daily. -obtain age appropriate screening labs and labs for chronic disease management. Dyspnea on exertion: New onset.  Associated with morbid obesity and bronchospasm and deconditioning.  Also underwent recent surgical correction of wrist fracture in the past 6 months.   Husband expresses concern regarding pulmonary embolism due to recent surgery.  Obtain d-dimer.  If positive, refer for CT angiogram.  Refer to pulmonology for full pulmonology evaluation.  Has undergone cardiac evaluation due to dyspnea on exertion and palpitations.  Underwent Holter monitoring 2D echo.  Present to emergency department for acute worsening.  Continue to use albuterol HFA as needed. Chronic medical conditions stable at this time.  Refills provided. Morbid obesity with BMI of 43: Recommend weight loss, exercise for 30-60 minutes five days per week; recommend 1200 kcal restriction per day with a minimum of 60 grams of protein per day.  Eat 3 meals per day. Do not skip meals. Consider having a protein shake as a meal replacement to aid with eliminating meal skipping. Look for products with <220 calories, <7 gm sugar, and 20-30 gm protein.  Eat breakfast within 2 hours of getting up.   Make  your plate non-starchy vegetables,  protein, and  carbohydrates at lunch and dinner.   Aim for at least 64 oz. of calorie-free beverages daily (water, Crystal Light, diet green tea, etc.). Eliminate any sugary beverages such as regular soda, sweet tea, or fruit juice.   Pay attention to hunger and fullness cues.  Stop eating once you feel satisfied; don't wait until you feel full, stuffed, or sick from eating.  Choose lean meats and low fat/fat free dairy products.  Choose foods high in fiber such as fruits, vegetables, and whole grains (brown rice, whole wheat pasta, whole wheat bread, etc.).  Limit foods with added sugar to <7 gm per serving.  Always eat in the kitchen/dining room.  Never eat in the bedroom or in front of the TV.     Orders Placed This Encounter  Procedures  . MM DIGITAL SCREENING BILATERAL    Standing Status:   Future    Standing Expiration Date:   04/23/2019    Order Specific Question:   Reason for Exam (SYMPTOM  OR DIAGNOSIS REQUIRED)    Answer:   screening for  breast cancer    Order Specific Question:   Preferred imaging location?    Answer:   [facility]  . CT Angio Chest W/Cm &/Or Wo Cm    Standing Status:   Future    Standing Expiration Date:   05/24/2019    Order Specific Question:   If indicated for the ordered procedure, I authorize the administration of contrast media per Radiology protocol    Answer:   Yes    Order Specific Question:   Preferred imaging location?  Answer:   [facility]    Order Specific Question:   Radiology Contrast Protocol - do NOT remove file path    Answer:   [email or web address]  . CBC with Differential/Platelet  . Comprehensive metabolic panel    Order Specific Question:   Has the patient fasted?    Answer:   No  . Hemoglobin A1c  . Lipid panel    Order Specific Question:   Has the patient fasted?    Answer:   No  . TSH  . D-dimer, quantitative (not at [facility])  . Ambulatory referral to Pulmonology    Referral Priority:   Routine    Referral Type:   Consultation    Referral Reason:   Specialty Services Required    Requested Specialty:   Pulmonary Disease    Number of Visits Requested:   1  . POCT urinalysis dipstick   Meds ordered this encounter  Medications  . DULoxetine (CYMBALTA) 60 MG capsule    Sig: Take 1 capsule (60 mg total) by mouth daily.    Dispense:  90 capsule    Refill:  1  . gabapentin (NEURONTIN) 300 MG capsule    Sig: Take 1-2 capsules (300-600 mg total) by mouth 4 (four) times daily.    Dispense:  540 capsule    Refill:  1  . ondansetron (ZOFRAN-ODT) 4 MG disintegrating tablet    Sig: DISSOLVE 1 TABLET BY MOUTH EVERY 8 HOURS AS NEEDED FOR NAUSEA OR VOMITING.    Dispense:  20 tablet    Refill:  4  . sertraline (ZOLOFT) 50 MG tablet    Sig: Take 1 tablet (50 mg total) by mouth daily.    Dispense:  90 tablet    Refill:  1    Return in about 3 months (around 05/22/2018) for follow-up chronic medical conditions SANTIAGO.   Kaiah Hosea Elayne Guerin, M.D. Primary Care at [facility] previously [facility] [address] [place], [place]  [address] [phone] phone [phone] fax

## 2018-02-20 LAB — COMPREHENSIVE METABOLIC PANEL
A/G RATIO: 1.8 (ref 1.2–2.2)
ALBUMIN: 4 g/dL (ref 3.6–4.8)
ALT: 10 IU/L (ref 0–32)
AST: 17 IU/L (ref 0–40)
Alkaline Phosphatase: 94 IU/L (ref 39–117)
BUN/Creatinine Ratio: 11 — ABNORMAL LOW (ref 12–28)
BUN: 11 mg/dL (ref 8–27)
CO2: 22 mmol/L (ref 20–29)
Calcium: 8.9 mg/dL (ref 8.7–10.3)
Chloride: 106 mmol/L (ref 96–106)
Creatinine, Ser: 0.97 mg/dL (ref 0.57–1.00)
GFR, EST AFRICAN AMERICAN: 73 mL/min/{1.73_m2} (ref >59)
GFR, EST NON AFRICAN AMERICAN: 63 mL/min/{1.73_m2} (ref >59)
GLUCOSE: 94 mg/dL (ref 65–99)
Globulin, Total: 2.2 g/dL (ref 1.5–4.5)
Potassium: 4.4 mmol/L (ref 3.5–5.2)
Sodium: 143 mmol/L (ref 134–144)
Total Protein: 6.2 g/dL (ref 6.0–8.5)

## 2018-02-20 LAB — CBC WITH DIFFERENTIAL/PLATELET
Basophils Absolute: 0 10*3/uL (ref 0.0–0.2)
Basos: 0 %
EOS (ABSOLUTE): 0.1 10*3/uL (ref 0.0–0.4)
EOS: 3 %
HEMATOCRIT: 33.7 % — AB (ref 34.0–46.6)
HEMOGLOBIN: 10.5 g/dL — AB (ref 11.1–15.9)
Immature Grans (Abs): 0 10*3/uL (ref 0.0–0.1)
Immature Granulocytes: 0 %
Lymphocytes Absolute: 1.8 10*3/uL (ref 0.7–3.1)
Lymphs: 33 %
MCH: 26 pg — ABNORMAL LOW (ref 26.6–33.0)
MCHC: 31.2 g/dL — ABNORMAL LOW (ref 31.5–35.7)
MCV: 83 fL (ref 79–97)
MONOCYTES: 10 %
Monocytes Absolute: 0.5 10*3/uL (ref 0.1–0.9)
NEUTROS PCT: 54 %
Neutrophils Absolute: 3 10*3/uL (ref 1.4–7.0)
Platelets: 256 10*3/uL (ref 150–450)
RBC: 4.04 x10E6/uL (ref 3.77–5.28)
RDW: 14.3 % (ref 12.3–15.4)
WBC: 5.5 10*3/uL (ref 3.4–10.8)

## 2018-02-20 LAB — LIPID PANEL
CHOLESTEROL TOTAL: 168 mg/dL (ref 100–199)
Chol/HDL Ratio: 2.8 ratio (ref 0.0–4.4)
HDL: 60 mg/dL (ref >39)
LDL CALC: 84 mg/dL (ref 0–99)
TRIGLYCERIDES: 119 mg/dL (ref 0–149)
VLDL CHOLESTEROL CAL: 24 mg/dL (ref 5–40)

## 2018-02-20 LAB — TSH: TSH: 1.68 u[IU]/mL (ref 0.450–4.500)

## 2018-02-20 LAB — D-DIMER, QUANTITATIVE (NOT AT ARMC): D-DIMER: 1.14 mg{FEU}/L — AB (ref 0.00–0.49)

## 2018-02-20 LAB — HEMOGLOBIN A1C
Est. average glucose Bld gHb Est-mCnc: 126 mg/dL
Hgb A1c MFr Bld: 6 % — ABNORMAL HIGH (ref 4.8–5.6)

## 2018-02-20 MED ORDER — SERTRALINE HCL 50 MG PO TABS: 50.0000 mg | ORAL_TABLET | Freq: Every day | ORAL | 1 refills | Status: DC

## 2018-02-20 MED ORDER — DULOXETINE HCL 60 MG PO CPEP: 60.0000 mg | ORAL_CAPSULE | Freq: Every day | ORAL | 1 refills | Status: DC

## 2018-02-20 MED ORDER — GABAPENTIN 300 MG PO CAPS: 300.0000 mg | ORAL_CAPSULE | Freq: Four times a day (QID) | ORAL | 1 refills | Status: DC

## 2018-02-20 MED ORDER — ONDANSETRON 4 MG PO TBDP: ORAL_TABLET | ORAL | 4 refills | Status: DC

## 2018-02-22 ENCOUNTER — Ambulatory Visit (HOSPITAL_COMMUNITY)
Admission: RE | Admit: 2018-02-22 | Discharge: 2018-02-22 | Disposition: A | Discharge status: Home / Self Care | Payer: 59 | Source: Ambulatory Visit | Attending: Family Medicine | Admitting: Family Medicine

## 2018-02-22 DIAGNOSIS — R0602 Shortness of breath: Secondary | ICD-10-CM | POA: Insufficient documentation

## 2018-02-22 DIAGNOSIS — R918 Other nonspecific abnormal finding of lung field: Secondary | ICD-10-CM | POA: Insufficient documentation

## 2018-02-22 DIAGNOSIS — K449 Diaphragmatic hernia without obstruction or gangrene: Secondary | ICD-10-CM | POA: Diagnosis not present

## 2018-02-22 DIAGNOSIS — I517 Cardiomegaly: Secondary | ICD-10-CM | POA: Insufficient documentation

## 2018-02-22 DIAGNOSIS — I313 Pericardial effusion (noninflammatory): Secondary | ICD-10-CM | POA: Insufficient documentation

## 2018-02-22 MED ORDER — IOPAMIDOL (ISOVUE-370) INJECTION 76%
INTRAVENOUS | Status: AC
  Administered 2018-02-22: 13:00:00
  Filled 2018-02-22: qty 100

## 2018-02-22 MED FILL — ONDANSETRON ODT 4 MG TABLET: 4 | 7 days supply | Qty: 20 | Fill #0

## 2018-02-23 ENCOUNTER — Ambulatory Visit (HOSPITAL_COMMUNITY): Payer: Medicare Other

## 2018-02-23 ENCOUNTER — Encounter: Payer: Self-pay | Admitting: Family Medicine

## 2018-02-24 LAB — IRON AND TIBC
IRON SATURATION: 8 % — AB (ref 15–55)
Iron: 29 ug/dL (ref 27–139)
TIBC: 386 ug/dL (ref 250–450)
UIBC: 357 ug/dL (ref 118–369)

## 2018-02-24 LAB — SPECIMEN STATUS REPORT

## 2018-03-09 MED FILL — DEXILANT DR 60 MG CAPSULE: 60 | 90 days supply | Qty: 90 | Fill #1

## 2018-03-09 MED FILL — SERTRALINE HCL 50 MG TABLET: 50 | 90 days supply | Qty: 90 | Fill #1

## 2018-03-15 ENCOUNTER — Ambulatory Visit: Payer: Self-pay

## 2018-03-23 ENCOUNTER — Other Ambulatory Visit (INDEPENDENT_AMBULATORY_CARE_PROVIDER_SITE_OTHER): Payer: 59

## 2018-03-23 ENCOUNTER — Encounter: Payer: Self-pay | Admitting: Pulmonary Disease

## 2018-03-23 ENCOUNTER — Ambulatory Visit (INDEPENDENT_AMBULATORY_CARE_PROVIDER_SITE_OTHER): Payer: 59 | Admitting: Pulmonary Disease

## 2018-03-23 VITALS — BP 122/80 | HR 73 | Ht 64.0 in | Wt 263.4 lb

## 2018-03-23 DIAGNOSIS — I5032 Chronic diastolic (congestive) heart failure: Secondary | ICD-10-CM | POA: Diagnosis not present

## 2018-03-23 DIAGNOSIS — I501 Left ventricular failure: Secondary | ICD-10-CM | POA: Insufficient documentation

## 2018-03-23 DIAGNOSIS — K449 Diaphragmatic hernia without obstruction or gangrene: Secondary | ICD-10-CM | POA: Diagnosis not present

## 2018-03-23 DIAGNOSIS — R0609 Other forms of dyspnea: Secondary | ICD-10-CM | POA: Diagnosis not present

## 2018-03-23 LAB — BASIC METABOLIC PANEL
BUN: 14 mg/dL (ref 6–23)
CO2: 27 mEq/L (ref 19–32)
CREATININE: 1.1 mg/dL (ref 0.40–1.20)
Calcium: 9.5 mg/dL (ref 8.4–10.5)
Chloride: 108 mEq/L (ref 96–112)
GFR: 53.48 mL/min — AB (ref >60.00)
Glucose, Bld: 107 mg/dL — ABNORMAL HIGH (ref 70–99)
Potassium: 4.7 mEq/L (ref 3.5–5.1)
Sodium: 139 mEq/L (ref 135–145)

## 2018-03-23 MED FILL — ONDANSETRON ODT 4 MG TABLET: 4 | 7 days supply | Qty: 20 | Fill #1

## 2018-03-23 MED FILL — PROAIR RESPICLICK INHAL PWD: 108 (90 BAS | 25 days supply | Qty: 1 | Fill #1

## 2018-03-23 NOTE — Patient Instructions (Addendum)
Labs planned today, pBNP and BMET.  RTC in 4 weeks with full PFTs.

## 2018-03-23 NOTE — Progress Notes (Signed)
Synopsis: Referred in August 2019 for dyspnea on exertion by Wardell Honour, MD  Subjective:   PATIENT ID: [name] GENDER: female DOB: 1956-03-25, MRN: [id number]  Chief Complaint  Patient presents with  . pulmonary consult    referred by Dr. Tamala Julian for shortness of breath    She has had breathing trouble for the past few months. She had trouble since July. She has noticed that she has had increased swelling. Her feet and legs have been swollen for some time. She was seen by her PCP for this back in June due to wheezing. She as diagnosed with asthma this time. She was started on albuterol and it helps her symptoms.  She does have shortness of breath associated with bending over (bendapnea)  She has had two significant falls at home with many orthopedic injuries. She had another fall back in December. Do to the fall history she has slowed down mainly due to the fear of falling. Currently not as active as she was. 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   She has allergies throughout the year off and on, coughing, sneezing, running nose. Never had hives. Never had allergy testing. Never diagnosed with heart trouble.    Past Medical History:  Diagnosis Date  . Allergy    [name], Flonase  . Anemia   . Anxiety   . Arthritis   . Chronic kidney disease   . Colon polyps   . Depression   . Fibromyalgia   . Gastritis   . GERD (gastroesophageal reflux disease)   . Hernia, hiatal   . HLD (hyperlipidemia)   . Hypertension   . Migraine   . Neuropathy   . Osteoporosis   . Pneumonia   . Sleep apnea with use of continuous positive airway pressure (CPAP)   . Thyroid goiter      Family History  Problem Relation Age of Onset  . Heart disease Mother 5       AMI/CAD/CHF as cause of death  . Hypertension Mother   . Hyperlipidemia Mother   . Hypothyroidism Mother   . Depression Mother   . Gout Mother   . Heart disease Father 59       AMI  .  Hypertension Father   . Stroke Father 22       mild CVA  . Prostate cancer Father   . Hyperlipidemia Father   . Cancer Father 25       prostate cancer  . Hypertension Brother   . Hyperlipidemia Brother   . Cancer Brother        prostate cancer  . Cancer Maternal Grandmother        type unknown  . Cancer Maternal Grandfather        type unknown  . Heart disease Paternal Grandmother   . Heart defect Paternal Grandfather   . Melanoma Brother   . Hyperlipidemia Brother   . Hypertension Brother   . Cancer Brother        melanoma  . Hypertension Brother   . Hyperlipidemia Brother   . Melanoma Brother   . Cancer Brother 25       melanoma  . Cancer Sister        Basal cell carcinoma scalp  . Depression Sister   . Hypertension Brother   . Depression Brother   . Hypertension Brother   . Gout Brother   . Arthritis Sister   . Depression  Sister      Social History   Socioeconomic History  . Marital status: Married    Spouse name: Not on file  . Number of children: 3  . Years of education: Not on file  . Highest education level: Not on file  Occupational History  . Occupation: unemployed  Social Needs  . Financial resource strain: Not on file  . Food insecurity:    Worry: Not on file    Inability: Not on file  . Transportation needs:    Medical: Not on file    Non-medical: Not on file  Tobacco Use  . Smoking status: Former Smoker    Last attempt to quit: 08/04/1998    Years since quitting: 19.6  . Smokeless tobacco: Never Used  Substance and Sexual Activity  . Alcohol use: No  . Drug use: No  . Sexual activity: Yes    Birth control/protection: Surgical, Post-menopausal  Lifestyle  . Physical activity:    Days per week: Not on file    Minutes per session: Not on file  . Stress: Not on file  Relationships  . Social connections:    Talks on phone: Not on file    Gets together: Not on file    Attends religious service: Not on file    Active member of club or  organization: Not on file    Attends meetings of clubs or organizations: Not on file    Relationship status: Not on file  . Intimate partner violence:    Fear of current or ex partner: Not on file    Emotionally abused: Not on file    Physically abused: Not on file    Forced sexual activity: Not on file  Other Topics Concern  . Not on file  Social History Narrative   Marital status: married x 20 years; moderately happy     Children: 3 children (47 [name], 6 Dianna, 40 April); 8 grandchildren; 0 gg     Lives: with husband/Steve, April, 2 granddaughters     Employment:  Unemployed; quit working 2015 with work related injury; awaiting disability in 2018; disability approved in 02/2017.       Tobacco: quit smoking in 2016; smoked x 2 years.      Alcohol: none      Drugs: none      Exercise: rarely in 2018           Allergies  Allergen Reactions  . Sulfa Antibiotics Itching     Outpatient Medications Prior to Visit  Medication Sig Dispense Refill  . Albuterol Sulfate (PROAIR RESPICLICK) 542 (90 Base) MCG/ACT AEPB Inhale 2 puffs into the lungs 4 (four) times daily as needed. 1 each 1  . atorvastatin (LIPITOR) 10 MG tablet Take 1 tablet (10 mg total) by mouth daily at 6 PM. 90 tablet 3  . butalbital-acetaminophen-caffeine (FIORICET, ESGIC) 50-325-40 MG tablet Take 1-2 tablets by mouth every 6 (six) hours as needed for headache. 40 tablet 0  . cyclobenzaprine (FLEXERIL) 10 MG tablet TAKE 1/2-1 TABLET BY MOUTH 3 TIMES DAILY AS NEEDED FOR MUSCLE SPASMS. 180 tablet 1  . darifenacin (ENABLEX) 15 MG 24 hr tablet Take 1 tablet (15 mg total) by mouth daily. 90 tablet 3  . dexlansoprazole (DEXILANT) 60 MG capsule Take 1 capsule (60 mg total) by mouth daily. 90 capsule 3  . docusate sodium (COLACE) 100 MG capsule TAKE 1 CAPSULE TWO TIMES DAILY  0  . DULoxetine (CYMBALTA) 60 MG capsule Take 1 capsule (60  mg total) by mouth daily. 90 capsule 1  . gabapentin (NEURONTIN) 300 MG capsule Take 1-2  capsules (300-600 mg total) by mouth 4 (four) times daily. 540 capsule 1  . lisinopril (PRINIVIL,ZESTRIL) 20 MG tablet Take 1 tablet (20 mg total) by mouth daily. 90 tablet 3  . ondansetron (ZOFRAN-ODT) 4 MG disintegrating tablet DISSOLVE 1 TABLET BY MOUTH EVERY 8 HOURS AS NEEDED FOR NAUSEA OR VOMITING. 20 tablet 4  . oxyCODONE-acetaminophen (PERCOCET/ROXICET) 5-325 MG tablet Take 1 tablet by mouth every 12 (twelve) hours as needed for severe pain. 30 tablet 0  . propranolol (INDERAL) 60 MG tablet Take 1 tablet (60 mg total) 3 (three) times daily by mouth. 270 tablet 3  . ranitidine (ZANTAC) 150 MG tablet Take 1 tablet (150 mg total) by mouth at bedtime. 90 tablet 3  . sertraline (ZOLOFT) 50 MG tablet Take 1 tablet (50 mg total) by mouth daily. 90 tablet 1  . topiramate (TOPAMAX) 50 MG tablet TAKE 1 TABLET (50 MG TOTAL) BY MOUTH 2 (TWO) TIMES DAILY. 180 tablet 1   No facility-administered medications prior to visit.     Review of Systems  Constitutional: Negative.   HENT: Negative.   Eyes: Negative.   Respiratory: Positive for cough, shortness of breath and wheezing.   Cardiovascular: Positive for leg swelling.  Gastrointestinal: Negative.   Genitourinary: Negative.   Musculoskeletal: Positive for falls.  Skin: Negative.   Neurological: Positive for dizziness and weakness.  Endo/Heme/Allergies: Negative.   Psychiatric/Behavioral: Negative.      Objective:  Physical Exam  Constitutional: She is oriented to person, place, and time. She appears well-developed and well-nourished. No distress.  HENT:  Head: Normocephalic and atraumatic.  Mouth/Throat: Oropharynx is clear and moist.  Eyes: Pupils are equal, round, and reactive to light. Conjunctivae are normal. No scleral icterus.  Neck: Neck supple. No JVD present. No tracheal deviation present.  Cardiovascular: Normal rate, regular rhythm, normal heart sounds and intact distal pulses.  No murmur heard. Pulmonary/Chest: Effort normal  and breath sounds normal. No accessory muscle usage or stridor. No tachypnea. No respiratory distress. She has no wheezes. She has no rhonchi. She has no rales.  Abdominal: Soft. Bowel sounds are normal. She exhibits no distension. There is no tenderness.  Obese pannus  Musculoskeletal: She exhibits edema (trace). She exhibits no tenderness.  Lymphadenopathy:    She has no cervical adenopathy.  Neurological: She is alert and oriented to person, place, and time.  Skin: Skin is warm and dry. Capillary refill takes less than 2 seconds. No rash noted.  Psychiatric: She has a normal mood and affect. Her behavior is normal.  Vitals reviewed.  Vitals:   03/23/18 1532  BP: 122/80  Pulse: 73  SpO2: 97%  Weight: 263 lb 6.4 oz (119.5 kg)  Height: 5\' 4"  (1.626 m)   97% on RA BMI Readings from Last 3 Encounters:  03/23/18 45.21 kg/m  02/19/18 43.82 kg/m  12/29/17 43.92 kg/m   Wt Readings from Last 3 Encounters:  03/23/18 263 lb 6.4 oz (119.5 kg)  02/19/18 263 lb (119.3 kg)  12/29/17 262 lb (118.8 kg)     CBC    Component Value Date/Time   WBC 5.5 02/19/2018 1136   WBC 4.2 (A) 12/10/2017 1638   WBC 6.6 07/06/2017 1217   RBC 4.04 02/19/2018 1136   RBC 4.19 12/10/2017 1638   RBC 3.97 07/06/2017 1217   HGB 10.5 (L) 02/19/2018 1136   HCT 33.7 (L) 02/19/2018 1136   PLT  256 02/19/2018 1136   MCV 83 02/19/2018 1136   MCH 26.0 (L) 02/19/2018 1136   MCH 26.5 (A) 12/10/2017 1638   MCH 27.5 07/06/2017 1217   MCHC 31.2 (L) 02/19/2018 1136   MCHC 31.7 (A) 12/10/2017 1638   MCHC 31.7 07/06/2017 1217   RDW 14.3 02/19/2018 1136   LYMPHSABS 1.8 02/19/2018 1136   MONOABS 0.5 07/06/2017 1217   EOSABS 0.1 02/19/2018 1136   BASOSABS 0.0 02/19/2018 1136    Chest Imaging: 02/22/2018 CT of the chest reviewed with evidence of pulmonary edema as well as small pericardial effusion. The patient's images have been independently reviewed by me.    Pulmonary Functions Testing Results: No results  found for: FEV1, FVC, FEV1FVC, TLC, DLCO  FeNO: None  Pathology: None  Echocardiogram:  06/24/2017 Study Conclusions  - Left ventricle: The cavity size was normal. Wall thickness was   normal. Systolic function was normal. The estimated ejection   fraction was in the range of 55% to 60%. Wall motion was normal;   there were no regional wall motion abnormalities. Doppler   parameters are consistent with abnormal left ventricular   relaxation (grade 1 diastolic dysfunction). The E/e&' ratio is   between 8-15, suggesting indeterminate LV filling pressure. - Left atrium: The atrium was normal in size. - Inferior vena cava: The vessel was normal in size. The   respirophasic diameter changes were in the normal range (>= 50%),   consistent with normal central venous pressure.  Impressions:  - LVEF 55-60%, normal wall thickness, normal wall motion, grade 1   DD, indeterminate LV filling pressure, normal LA size, normal   IVC.  Heart Catheterization: None     Assessment & Plan:   Pulmonary edema with congestive heart failure with preserved left ventricular function ([place]) - Plan: Basic Metabolic Panel (BMET)  DOE (dyspnea on exertion) - Plan: Pro b natriuretic peptide, Pulmonary Function Test, Basic Metabolic Panel (BMET)  Obesity, morbid, BMI 40.0-49.9 (H[facility])  Chronic diastolic heart failure ([place])  Hiatal hernia  Discussion:  This is a 62 year old female with history of dyspnea on exertion and occasional wheezing.  Her husband is a respiratory therapist and on occasion has noticed her wheezing to be more of a wet nature and associated with increased lower extremity edema.  She was started on as needed albuterol for possible underlying asthma by PCP.  It would be unusual to have late onset asthma in an adult at this age however it is possible.  She does not have any prior or recent history of URI type symptoms.  Due to the increased lower extremity edema, CT imaging consistent  with coronary edema and small pericardial effusion as well as measurements of the right ventricle and pulmonary artery which suggests they are both mildly enlarged.  Prior echocardiogram with grade 1 diastolic dysfunction and a preserved ejection fraction.  Additionally her shortness of breath may be related to deconditioning secondary to her multiple recent falls and orthopedic procedures and being relatively inactive for the past several months.   We will check her proBNP today as well as a basic metabolic pane. She does have Lasix at home but does not take them regularly. Recommended increased exercise and activity level.  Due to the recent falls would recommend not doing this with out assistance or somebody accompany her. Encouraged weight loss.  Patient was counseled on this as well today. She can continue her as needed albuterol but she has not needed this recently. Will obtain full PFTs  on follow-up. Management of her fluid status and slowly increasing her exercise tolerance and weight loss will be the first step in the investigation of her dyspnea on exertion. Return to clinic in 4 weeks with NP   Current Outpatient Medications:  .  Albuterol Sulfate (PROAIR RESPICLICK) 720 (90 Base) MCG/ACT AEPB, Inhale 2 puffs into the lungs 4 (four) times daily as needed., Disp: 1 each, Rfl: 1 .  atorvastatin (LIPITOR) 10 MG tablet, Take 1 tablet (10 mg total) by mouth daily at 6 PM., Disp: 90 tablet, Rfl: 3 .  butalbital-acetaminophen-caffeine (FIORICET, ESGIC) 50-325-40 MG tablet, Take 1-2 tablets by mouth every 6 (six) hours as needed for headache., Disp: 40 tablet, Rfl: 0 .  cyclobenzaprine (FLEXERIL) 10 MG tablet, TAKE 1/2-1 TABLET BY MOUTH 3 TIMES DAILY AS NEEDED FOR MUSCLE SPASMS., Disp: 180 tablet, Rfl: 1 .  darifenacin (ENABLEX) 15 MG 24 hr tablet, Take 1 tablet (15 mg total) by mouth daily., Disp: 90 tablet, Rfl: 3 .  dexlansoprazole (DEXILANT) 60 MG capsule, Take 1 capsule (60 mg total) by  mouth daily., Disp: 90 capsule, Rfl: 3 .  docusate sodium (COLACE) 100 MG capsule, TAKE 1 CAPSULE TWO TIMES DAILY, Disp: , Rfl: 0 .  DULoxetine (CYMBALTA) 60 MG capsule, Take 1 capsule (60 mg total) by mouth daily., Disp: 90 capsule, Rfl: 1 .  gabapentin (NEURONTIN) 300 MG capsule, Take 1-2 capsules (300-600 mg total) by mouth 4 (four) times daily., Disp: 540 capsule, Rfl: 1 .  lisinopril (PRINIVIL,ZESTRIL) 20 MG tablet, Take 1 tablet (20 mg total) by mouth daily., Disp: 90 tablet, Rfl: 3 .  ondansetron (ZOFRAN-ODT) 4 MG disintegrating tablet, DISSOLVE 1 TABLET BY MOUTH EVERY 8 HOURS AS NEEDED FOR NAUSEA OR VOMITING., Disp: 20 tablet, Rfl: 4 .  oxyCODONE-acetaminophen (PERCOCET/ROXICET) 5-325 MG tablet, Take 1 tablet by mouth every 12 (twelve) hours as needed for severe pain., Disp: 30 tablet, Rfl: 0 .  propranolol (INDERAL) 60 MG tablet, Take 1 tablet (60 mg total) 3 (three) times daily by mouth., Disp: 270 tablet, Rfl: 3 .  ranitidine (ZANTAC) 150 MG tablet, Take 1 tablet (150 mg total) by mouth at bedtime., Disp: 90 tablet, Rfl: 3 .  sertraline (ZOLOFT) 50 MG tablet, Take 1 tablet (50 mg total) by mouth daily., Disp: 90 tablet, Rfl: 1 .  topiramate (TOPAMAX) 50 MG tablet, TAKE 1 TABLET (50 MG TOTAL) BY MOUTH 2 (TWO) TIMES DAILY., Disp: 180 tablet, Rfl: 1   Garner Nash, DO  Pulmonary Critical Care 03/23/2018 4:28 PM

## 2018-03-25 LAB — PRO B NATRIURETIC PEPTIDE: NT-PRO BNP: 148 pg/mL (ref 0–287)

## 2018-03-25 NOTE — Progress Notes (Signed)
[name], Can you let her know that her labs look ok. Once we have her PFTS we can consider starting a maintenance inhaler like Breo. When is she scheduled to see me again? If it's several weeks we should go ahead and have her complete the PFTS in the meantime.  Thanks BLI

## 2018-04-15 ENCOUNTER — Ambulatory Visit
Admission: RE | Admit: 2018-04-15 | Discharge: 2018-04-15 | Disposition: A | Discharge status: Home / Self Care | Payer: 59 | Source: Ambulatory Visit | Attending: Family Medicine | Admitting: Family Medicine

## 2018-04-15 DIAGNOSIS — Z1231 Encounter for screening mammogram for malignant neoplasm of breast: Secondary | ICD-10-CM | POA: Diagnosis not present

## 2018-04-15 MED FILL — DULoxetine HCL 60 MG CPEP: 60 | 60 days supply | Qty: 60 | Fill #1

## 2018-04-15 MED FILL — ONDANSETRON ODT 4 MG TABLET: 4 | 7 days supply | Qty: 20 | Fill #2

## 2018-04-26 NOTE — Progress Notes (Signed)
Synopsis: Referred in August 2019 for dyspnea on exertion by Rutherford Guys, MD  Subjective:   PATIENT ID: [name] GENDER: female DOB: June 10, 1956, MRN: [id number]  Chief Complaint  Patient presents with  . Follow-up    PFT test done today.     She has had breathing trouble for the past few months. She had trouble since July. She has noticed that she has had increased swelling. Her feet and legs have been swollen for some time. She was seen by her PCP for this back in June due to wheezing. She as diagnosed with asthma this time. She was started on albuterol and it helps her symptoms.  She does have shortness of breath associated with bending over (bendapnea)  She has had two significant falls at home with many orthopedic injuries. She had another fall back in December. Do to the fall history she has slowed down mainly due to the fear of falling. Currently not as active as she was. 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   She has allergies throughout the year off and on, coughing, sneezing, running nose. Never had hives. Never had allergy testing. Never diagnosed with heart trouble.   OV 04/27/2018: Since her last office visit patient states her dyspnea on exertion and shortness of breath is approximately the same.  She has noticed knee pain with walking.  She is trying to be more active.  She is getting out of the house during the day and trying to visit her sister.  She has noticed changes in her sleep habits however and the fact that she is sleeping most of the day sometimes up until lunchtime.  She does have sleep apnea and wears her CPAP regularly.  Her husband works nights which also affects her schedule during the day due to this.  PFTs were completed today in the office prior to visit.  She denies chest pain, shortness of breath at rest, nausea vomiting, cough or daily sputum production.   Past Medical History:  Diagnosis Date  . Allergy    [name], Flonase  . Anemia   . Anxiety   . Arthritis   . Chronic kidney disease   . Colon polyps   . Depression   . Fibromyalgia   . Gastritis   . GERD (gastroesophageal reflux disease)   . Hernia, hiatal   . HLD (hyperlipidemia)   . Hypertension   . Migraine   . Neuropathy   . Osteoporosis   . Pneumonia   . Sleep apnea with use of continuous positive airway pressure (CPAP)   . Thyroid goiter      Family History  Problem Relation Age of Onset  . Heart disease Mother 70       AMI/CAD/CHF as cause of death  . Hypertension Mother   . Hyperlipidemia Mother   . Hypothyroidism Mother   . Depression Mother   . Gout Mother   . Heart disease Father 46       AMI  . Hypertension Father   . Stroke Father 26       mild CVA  . Prostate cancer Father   . Hyperlipidemia Father   . Cancer Father 60       prostate cancer  . Hypertension Brother   . Hyperlipidemia Brother   . Cancer Brother        prostate cancer  . Cancer Maternal Grandmother        type unknown  .  Cancer Maternal Grandfather        type unknown  . Heart disease Paternal Grandmother   . Heart defect Paternal Grandfather   . Melanoma Brother   . Hyperlipidemia Brother   . Hypertension Brother   . Cancer Brother        melanoma  . Hypertension Brother   . Hyperlipidemia Brother   . Melanoma Brother   . Cancer Brother 25       melanoma  . Cancer Sister        Basal cell carcinoma scalp  . Depression Sister   . Hypertension Brother   . Depression Brother   . Hypertension Brother   . Gout Brother   . Arthritis Sister   . Depression Sister      Social History   Socioeconomic History  . Marital status: Divorced    Spouse name: Not on file  . Number of children: 3  . Years of education: Not on file  . Highest education level: Not on file  Occupational History  . Occupation: unemployed  Social Needs  . Financial resource strain: Not on file  . Food insecurity:    Worry: Not on file     Inability: Not on file  . Transportation needs:    Medical: Not on file    Non-medical: Not on file  Tobacco Use  . Smoking status: Former Smoker    Last attempt to quit: 08/04/1998    Years since quitting: 19.7  . Smokeless tobacco: Never Used  Substance and Sexual Activity  . Alcohol use: No  . Drug use: No  . Sexual activity: Yes    Birth control/protection: Surgical, Post-menopausal  Lifestyle  . Physical activity:    Days per week: Not on file    Minutes per session: Not on file  . Stress: Not on file  Relationships  . Social connections:    Talks on phone: Not on file    Gets together: Not on file    Attends religious service: Not on file    Active member of club or organization: Not on file    Attends meetings of clubs or organizations: Not on file    Relationship status: Not on file  . Intimate partner violence:    Fear of current or ex partner: Not on file    Emotionally abused: Not on file    Physically abused: Not on file    Forced sexual activity: Not on file  Other Topics Concern  . Not on file  Social History Narrative   Marital status: married x 20 years; moderately happy     Children: 3 children (47 [name], 51 Dianna, 40 April); 8 grandchildren; 0 gg     Lives: with husband/Steve, April, 2 granddaughters     Employment:  Unemployed; quit working 2015 with work related injury; awaiting disability in 2018; disability approved in 02/2017.       Tobacco: quit smoking in 2016; smoked x 2 years.      Alcohol: none      Drugs: none      Exercise: rarely in 2018           Allergies  Allergen Reactions  . Sulfa Antibiotics Itching     Outpatient Medications Prior to Visit  Medication Sig Dispense Refill  . Albuterol Sulfate (PROAIR RESPICLICK) 774 (90 Base) MCG/ACT AEPB Inhale 2 puffs into the lungs 4 (four) times daily as needed. 1 each 1  . atorvastatin (LIPITOR) 10 MG tablet Take  1 tablet (10 mg total) by mouth daily at 6 PM. 90 tablet 3  .  cyclobenzaprine (FLEXERIL) 10 MG tablet TAKE 1/2-1 TABLET BY MOUTH 3 TIMES DAILY AS NEEDED FOR MUSCLE SPASMS. 180 tablet 1  . darifenacin (ENABLEX) 15 MG 24 hr tablet Take 1 tablet (15 mg total) by mouth daily. 90 tablet 3  . dexlansoprazole (DEXILANT) 60 MG capsule Take 1 capsule (60 mg total) by mouth daily. 90 capsule 3  . docusate sodium (COLACE) 100 MG capsule TAKE 1 CAPSULE TWO TIMES DAILY  0  . DULoxetine (CYMBALTA) 60 MG capsule Take 1 capsule (60 mg total) by mouth daily. 90 capsule 1  . gabapentin (NEURONTIN) 300 MG capsule Take 1-2 capsules (300-600 mg total) by mouth 4 (four) times daily. 540 capsule 1  . lisinopril (PRINIVIL,ZESTRIL) 20 MG tablet Take 1 tablet (20 mg total) by mouth daily. 90 tablet 3  . ondansetron (ZOFRAN-ODT) 4 MG disintegrating tablet DISSOLVE 1 TABLET BY MOUTH EVERY 8 HOURS AS NEEDED FOR NAUSEA OR VOMITING. 20 tablet 4  . oxyCODONE-acetaminophen (PERCOCET/ROXICET) 5-325 MG tablet Take 1 tablet by mouth every 12 (twelve) hours as needed for severe pain. 30 tablet 0  . propranolol (INDERAL) 60 MG tablet Take 1 tablet (60 mg total) 3 (three) times daily by mouth. 270 tablet 3  . ranitidine (ZANTAC) 150 MG tablet Take 1 tablet (150 mg total) by mouth at bedtime. 90 tablet 3  . sertraline (ZOLOFT) 50 MG tablet Take 1 tablet (50 mg total) by mouth daily. 90 tablet 1  . topiramate (TOPAMAX) 50 MG tablet TAKE 1 TABLET (50 MG TOTAL) BY MOUTH 2 (TWO) TIMES DAILY. 180 tablet 1   No facility-administered medications prior to visit.     Review of Systems  Constitutional: Negative.   HENT: Negative.   Eyes: Negative.   Respiratory: Positive for shortness of breath and wheezing. Negative for cough.   Cardiovascular: Positive for leg swelling.  Gastrointestinal: Negative.   Genitourinary: Negative.   Musculoskeletal: Negative for falls.  Skin: Negative.   Neurological: Positive for dizziness and weakness.  Endo/Heme/Allergies: Negative.   Psychiatric/Behavioral:  Negative.      Objective:  Physical Exam  Constitutional: She is oriented to person, place, and time. She appears well-developed and well-nourished. No distress.  HENT:  Head: Normocephalic and atraumatic.  Mouth/Throat: Oropharynx is clear and moist.  Eyes: Pupils are equal, round, and reactive to light. Conjunctivae are normal. No scleral icterus.  Neck: Neck supple. No JVD present. No tracheal deviation present.  Cardiovascular: Normal rate, regular rhythm, normal heart sounds and intact distal pulses.  No murmur heard. Pulmonary/Chest: Effort normal and breath sounds normal. No accessory muscle usage or stridor. No tachypnea. No respiratory distress. She has no wheezes. She has no rhonchi. She has no rales.  Abdominal: Soft. She exhibits no distension. There is no tenderness.  Obese pannus  Musculoskeletal: She exhibits no edema or tenderness.  Lymphadenopathy:    She has no cervical adenopathy.  Neurological: She is alert and oriented to person, place, and time.  Skin: Skin is warm and dry. Capillary refill takes less than 2 seconds. No rash noted.  Psychiatric: She has a normal mood and affect. Her behavior is normal.  Vitals reviewed.  Vitals:   04/27/18 1033  BP: 124/76  Pulse: 77  SpO2: 95%  Weight: 259 lb (117.5 kg)  Height: 5\' 2"  (1.575 m)   95% on RA BMI Readings from Last 3 Encounters:  04/27/18 47.37 kg/m  03/23/18 45.21  kg/m  02/19/18 43.82 kg/m   Wt Readings from Last 3 Encounters:  04/27/18 259 lb (117.5 kg)  03/23/18 263 lb 6.4 oz (119.5 kg)  02/19/18 263 lb (119.3 kg)     CBC    Component Value Date/Time   WBC 5.5 02/19/2018 1136   WBC 4.2 (A) 12/10/2017 1638   WBC 6.6 07/06/2017 1217   RBC 4.04 02/19/2018 1136   RBC 4.19 12/10/2017 1638   RBC 3.97 07/06/2017 1217   HGB 10.5 (L) 02/19/2018 1136   HCT 33.7 (L) 02/19/2018 1136   PLT 256 02/19/2018 1136   MCV 83 02/19/2018 1136   MCH 26.0 (L) 02/19/2018 1136   MCH 26.5 (A) 12/10/2017 1638    MCH 27.5 07/06/2017 1217   MCHC 31.2 (L) 02/19/2018 1136   MCHC 31.7 (A) 12/10/2017 1638   MCHC 31.7 07/06/2017 1217   RDW 14.3 02/19/2018 1136   LYMPHSABS 1.8 02/19/2018 1136   MONOABS 0.5 07/06/2017 1217   EOSABS 0.1 02/19/2018 1136   BASOSABS 0.0 02/19/2018 1136    Chest Imaging: 02/22/2018 CT of the chest reviewed with evidence of pulmonary edema as well as small pericardial effusion. The patient's images have been independently reviewed by me.    Pulmonary Functions Testing Results: 04/27/2018: Post bronchial dilator response was recorded FVC 2.7 L, 97% predicted, FEV1 2.34 L, 101% predicted, Ratio 85, no significant bronc dilator response, TLC 4.75 L, 111% predicted, DLCO 85% predicted.  FeNO: None  Pathology: None  Echocardiogram:  06/24/2017 Study Conclusions  - Left ventricle: The cavity size was normal. Wall thickness was   normal. Systolic function was normal. The estimated ejection   fraction was in the range of 55% to 60%. Wall motion was normal;   there were no regional wall motion abnormalities. Doppler   parameters are consistent with abnormal left ventricular   relaxation (grade 1 diastolic dysfunction). The E/e&' ratio is   between 8-15, suggesting indeterminate LV filling pressure. - Left atrium: The atrium was normal in size. - Inferior vena cava: The vessel was normal in size. The   respirophasic diameter changes were in the normal range (>= 50%),   consistent with normal central venous pressure.  Impressions:  - LVEF 55-60%, normal wall thickness, normal wall motion, grade 1   DD, indeterminate LV filling pressure, normal LA size, normal   IVC.  Heart Catheterization: None     Assessment & Plan:   DOE (dyspnea on exertion)  Obesity, morbid, BMI 40.0-49.9 (HCC)  Chronic diastolic heart failure (HCC)  Discussion:  Is a pleasant 62 year old female with a history of dyspnea on exertion.  I do feel as if most of her dyspnea is related  to her chronic diastolic heart failure and morbid obesity.  She does seem to be very deconditioned as after her second fall.  The husband agrees with this and the fact that she is less active as she was before after having the fall.  Her proBNP was within normal limits and her PFTs are reassuring with no evidence of obstruction or restriction.  Her ERV is significantly reduced at 4% consistent with her BMI.  Plan:  She needs to increase her activity level.  Patient was counseled on diet and exercise.  She is currently sleeping more of the day and sitting more of the day than she is active.  Continue CPAP use regularly with naps and sleep.  I think she should follow-up with her cardiologist in the meantime. I believe building her exercise tolerance  and weight loss will dramatically improve her exertional dyspnea.  She did state when she weighed 140 pounds she felt the best in her life.  Return to clinic in 6 months or as needed if symptoms worsen.   Current Outpatient Medications:  .  Albuterol Sulfate (PROAIR RESPICLICK) 599 (90 Base) MCG/ACT AEPB, Inhale 2 puffs into the lungs 4 (four) times daily as needed., Disp: 1 each, Rfl: 1 .  atorvastatin (LIPITOR) 10 MG tablet, Take 1 tablet (10 mg total) by mouth daily at 6 PM., Disp: 90 tablet, Rfl: 3 .  cyclobenzaprine (FLEXERIL) 10 MG tablet, TAKE 1/2-1 TABLET BY MOUTH 3 TIMES DAILY AS NEEDED FOR MUSCLE SPASMS., Disp: 180 tablet, Rfl: 1 .  darifenacin (ENABLEX) 15 MG 24 hr tablet, Take 1 tablet (15 mg total) by mouth daily., Disp: 90 tablet, Rfl: 3 .  dexlansoprazole (DEXILANT) 60 MG capsule, Take 1 capsule (60 mg total) by mouth daily., Disp: 90 capsule, Rfl: 3 .  docusate sodium (COLACE) 100 MG capsule, TAKE 1 CAPSULE TWO TIMES DAILY, Disp: , Rfl: 0 .  DULoxetine (CYMBALTA) 60 MG capsule, Take 1 capsule (60 mg total) by mouth daily., Disp: 90 capsule, Rfl: 1 .  gabapentin (NEURONTIN) 300 MG capsule, Take 1-2 capsules (300-600 mg total) by mouth  4 (four) times daily., Disp: 540 capsule, Rfl: 1 .  lisinopril (PRINIVIL,ZESTRIL) 20 MG tablet, Take 1 tablet (20 mg total) by mouth daily., Disp: 90 tablet, Rfl: 3 .  ondansetron (ZOFRAN-ODT) 4 MG disintegrating tablet, DISSOLVE 1 TABLET BY MOUTH EVERY 8 HOURS AS NEEDED FOR NAUSEA OR VOMITING., Disp: 20 tablet, Rfl: 4 .  oxyCODONE-acetaminophen (PERCOCET/ROXICET) 5-325 MG tablet, Take 1 tablet by mouth every 12 (twelve) hours as needed for severe pain., Disp: 30 tablet, Rfl: 0 .  propranolol (INDERAL) 60 MG tablet, Take 1 tablet (60 mg total) 3 (three) times daily by mouth., Disp: 270 tablet, Rfl: 3 .  ranitidine (ZANTAC) 150 MG tablet, Take 1 tablet (150 mg total) by mouth at bedtime., Disp: 90 tablet, Rfl: 3 .  sertraline (ZOLOFT) 50 MG tablet, Take 1 tablet (50 mg total) by mouth daily., Disp: 90 tablet, Rfl: 1 .  topiramate (TOPAMAX) 50 MG tablet, TAKE 1 TABLET (50 MG TOTAL) BY MOUTH 2 (TWO) TIMES DAILY., Disp: 180 tablet, Rfl: 1   Garner Nash, DO [facility] 04/27/2018 11:28 AM

## 2018-04-27 ENCOUNTER — Ambulatory Visit (INDEPENDENT_AMBULATORY_CARE_PROVIDER_SITE_OTHER): Payer: 59 | Admitting: Pulmonary Disease

## 2018-04-27 ENCOUNTER — Encounter: Payer: Self-pay | Admitting: Pulmonary Disease

## 2018-04-27 VITALS — BP 124/76 | HR 77 | Ht 62.0 in | Wt 259.0 lb

## 2018-04-27 DIAGNOSIS — R0609 Other forms of dyspnea: Secondary | ICD-10-CM

## 2018-04-27 DIAGNOSIS — Z23 Encounter for immunization: Secondary | ICD-10-CM | POA: Diagnosis not present

## 2018-04-27 DIAGNOSIS — I5032 Chronic diastolic (congestive) heart failure: Secondary | ICD-10-CM

## 2018-04-27 LAB — PULMONARY FUNCTION TEST
DL/VA % PRED: 96 %
DL/VA: 4.36 ml/min/mmHg/L
DLCO COR % PRED: 95 %
DLCO cor: 20.6 ml/min/mmHg
DLCO unc % pred: 85 %
DLCO unc: 18.49 ml/min/mmHg
FEF 25-75 POST: 2.36 L/s
FEF 25-75 Pre: 2.59 L/sec
FEF2575-%Change-Post: -8 %
FEF2575-%Pred-Post: 110 %
FEF2575-%Pred-Pre: 120 %
FEV1-%Change-Post: -2 %
FEV1-%PRED-POST: 101 %
FEV1-%Pred-Pre: 103 %
FEV1-POST: 2.34 L
FEV1-Pre: 2.39 L
FEV1FVC-%CHANGE-POST: 1 %
FEV1FVC-%Pred-Pre: 106 %
FEV6-%Change-Post: -4 %
FEV6-%PRED-PRE: 99 %
FEV6-%Pred-Post: 94 %
FEV6-PRE: 2.88 L
FEV6-Post: 2.75 L
FEV6FVC-%Pred-Post: 104 %
FEV6FVC-%Pred-Pre: 104 %
FVC-%Change-Post: -3 %
FVC-%Pred-Post: 92 %
FVC-%Pred-Pre: 95 %
FVC-PRE: 2.88 L
FVC-Post: 2.77 L
POST FEV6/FVC RATIO: 100 %
PRE FEV1/FVC RATIO: 83 %
Post FEV1/FVC ratio: 85 %
Pre FEV6/FVC Ratio: 100 %
RV % pred: 111 %
RV: 2.16 L
TLC % PRED: 111 %
TLC: 5.31 L

## 2018-04-27 NOTE — Progress Notes (Signed)
Pt completed full PFT today. 

## 2018-04-27 NOTE — Patient Instructions (Signed)
Follow up with cardiology.  Exercise, weight loss encouraged Avoid salt intake.  Return to clinic in 6 months or as needed

## 2018-05-14 MED FILL — LISINOPRIL 20 MG TABLET: 20 | 90 days supply | Qty: 90 | Fill #2

## 2018-05-14 MED FILL — TOPIRAMATE 50 MG TABLET: 50 | 90 days supply | Qty: 180 | Fill #1

## 2018-05-14 MED FILL — GABAPENTIN 300 MG CAPSULE: 300 | 68 days supply | Qty: 540 | Fill #0

## 2018-05-14 MED FILL — ATORVASTATIN 10 MG TABLET: 10 | 90 days supply | Qty: 90 | Fill #2

## 2018-05-14 MED FILL — PROPRANOLOL 60 MG TABLET: 60 | 90 days supply | Qty: 270 | Fill #3

## 2018-05-14 MED FILL — ONDANSETRON ODT 4 MG TABLET: 4 | 7 days supply | Qty: 20 | Fill #3

## 2018-05-14 MED FILL — raNITIdine HCL 150 MG TABS: 150 | 90 days supply | Qty: 90 | Fill #2

## 2018-05-14 MED FILL — CYCLOBENZAPRINE 10 MG TAB: 10 | 60 days supply | Qty: 180 | Fill #1

## 2018-05-18 ENCOUNTER — Ambulatory Visit: Payer: 59 | Admitting: Family Medicine

## 2018-05-20 ENCOUNTER — Ambulatory Visit: Payer: 59 | Admitting: Family Medicine

## 2018-05-21 ENCOUNTER — Ambulatory Visit: Payer: 59 | Admitting: Family Medicine

## 2018-06-04 DIAGNOSIS — H524 Presbyopia: Secondary | ICD-10-CM | POA: Diagnosis not present

## 2018-06-14 ENCOUNTER — Ambulatory Visit (INDEPENDENT_AMBULATORY_CARE_PROVIDER_SITE_OTHER): Payer: 59 | Admitting: Family Medicine

## 2018-06-14 ENCOUNTER — Encounter: Payer: Self-pay | Admitting: Family Medicine

## 2018-06-14 ENCOUNTER — Other Ambulatory Visit: Payer: Self-pay

## 2018-06-14 VITALS — BP 118/82 | HR 60 | Temp 98.5°F | Ht 62.0 in | Wt 260.8 lb

## 2018-06-14 DIAGNOSIS — Z6841 Body Mass Index (BMI) 40.0 and over, adult: Secondary | ICD-10-CM | POA: Diagnosis not present

## 2018-06-14 DIAGNOSIS — K219 Gastro-esophageal reflux disease without esophagitis: Secondary | ICD-10-CM

## 2018-06-14 DIAGNOSIS — I10 Essential (primary) hypertension: Secondary | ICD-10-CM

## 2018-06-14 DIAGNOSIS — E66813 Obesity, class 3: Secondary | ICD-10-CM

## 2018-06-14 DIAGNOSIS — G894 Chronic pain syndrome: Secondary | ICD-10-CM | POA: Diagnosis not present

## 2018-06-14 DIAGNOSIS — M797 Fibromyalgia: Secondary | ICD-10-CM

## 2018-06-14 DIAGNOSIS — R0609 Other forms of dyspnea: Secondary | ICD-10-CM | POA: Diagnosis not present

## 2018-06-14 MED ORDER — DEXLANSOPRAZOLE 60 MG PO CPDR: 60.0000 mg | DELAYED_RELEASE_CAPSULE | Freq: Every day | ORAL | 3 refills | Status: DC

## 2018-06-14 MED ORDER — DOCUSATE SODIUM 100 MG PO CAPS: ORAL_CAPSULE | ORAL | 2 refills | Status: DC

## 2018-06-14 MED ORDER — OXYCODONE-ACETAMINOPHEN 5-325 MG PO TABS: 1.0000 | ORAL_TABLET | Freq: Two times a day (BID) | ORAL | 0 refills | Status: DC | PRN

## 2018-06-14 MED FILL — DEXILANT DR 60 MG CAPSULE: 60 | 90 days supply | Qty: 90 | Fill #0

## 2018-06-14 MED FILL — OXYCODONE-ACETAMINOPHEN 5-3: 5-325 | 15 days supply | Qty: 30 | Fill #0

## 2018-06-14 NOTE — Patient Instructions (Signed)
° ° ° °  If you have lab work done today you will be contacted with your lab results within the next 2 weeks.  If you have not heard from us then please contact us. The fastest way to get your results is to register for My Chart. ° ° °IF you received an x-ray today, you will receive an invoice from [facility]. Please contact [facility] at [phone] with questions or concerns regarding your invoice.  ° °IF you received labwork today, you will receive an invoice from [facility]. Please contact [facility] at [phone] with questions or concerns regarding your invoice.  ° °Our billing staff will not be able to assist you with questions regarding bills from these companies. ° °You will be contacted with the lab results as soon as they are available. The fastest way to get your results is to activate your My Chart account. Instructions are located on the last page of this paperwork. If you have not heard from us regarding the results in 2 weeks, please contact this office. °  ° ° ° °

## 2018-06-14 NOTE — Progress Notes (Signed)
11/11/20193:35 PM  [name] 05/22/1956, 62 y.o. female [id number]  Chief Complaint  Patient presents with  . Follow-up    hypercholesterolemia  . Pain    right side hip, elbow and shoulder due to fall in 2015. Asking to start bk on oxycodone 5-325    HPI:   Patient is a 62 y.o. female with past medical history significant for HTN, HLP, dCHF, OSA on cpap, morbid obesity, GERD, pre-diabetes who presents today for routine followup  Previous PCP Dr Tamala Julian Last OV July 2019 Seen by pulm in sept for [facility] and obesity Normal PFTs Cards is Dr Ellyn Hack, has not had chance to make appt Last appt Jan 2019 Still uses albuterol for occ wheezing when she walks  Walking more of recent  Either goes around the mall or goes up and down steps at home  Has GERD with HH, takes PPI, H2B and zofran Overall doing well Having issues with constipation, would like refill of colace  No more falls or dizzy spells since she started taking her BP meds in the evening  Having occ RLS, low iron, not taking her supplement  Takes flexeril daily for her neck pain, muscle spasms Helps prevent migraines as well  Takes enablex every day for her bladder, helps with her urge incontinence  Takes cymbalta and sertraline for depression, feels overall doing well Had a recent down but now getting out of it  Gabapentin for fibromyalgia, doing well, no concerns  Takes topomax and propranlol for migraines, does not see neuro  Takes lisinopril for BP  Takes lipitor for HLP  Requesting refill of percocet, having some pain in her right hip and buttock Also has occ flare up right elbow pain from fall that caused disability for work Last refill march  pmp reviewed   Lab Results  Component Value Date   HGBA1C 6.0 (H) 02/19/2018   HGBA1C 5.9 (H) 01/09/2017   HGBA1C  03/10/2008    5.5 (NOTE)   The ADA recommends the following therapeutic goal for glycemic   control related to Hgb A1C measurement:    Goal of Therapy:   < 7.0% Hgb A1C   Reference: American Diabetes Association: Clinical Practice   Recommendations 2008, Diabetes Care,  2008, 31:(Suppl 1).   Lab Results  Component Value Date   MICROALBUR 1.2 05/28/2015   LDLCALC 84 02/19/2018   CREATININE 1.10 03/23/2018    Fall Risk  06/14/2018 02/19/2018 12/29/2017 12/15/2017 12/10/2017  Falls in the past year? 0 No No Yes No  Number falls in past yr: - - - 1 -  Injury with Fall? - - - Yes -  Comment - - - wrist surgery -     Depression screen [facility] 2/9 02/19/2018 12/29/2017 12/15/2017  Decreased Interest 0 0 0  Down, Depressed, Hopeless 0 0 0  PHQ - 2 Score 0 0 0  Altered sleeping - - -  Tired, decreased energy - - -  Change in appetite - - -  Feeling bad or failure about yourself  - - -  Trouble concentrating - - -  Moving slowly or fidgety/restless - - -  Suicidal thoughts - - -  PHQ-9 Score - - -  Difficult doing work/chores - - -  Some recent data might be hidden    Allergies  Allergen Reactions  . Sulfa Antibiotics Itching    Prior to Admission medications   Medication Sig Start Date End Date Taking? Authorizing Provider  Albuterol Sulfate (PROAIR  RESPICLICK) 161 (90 Base) MCG/ACT AEPB Inhale 2 puffs into the lungs 4 (four) times daily as needed. 12/10/17  Yes Rutherford Guys, MD  atorvastatin (LIPITOR) 10 MG tablet Take 1 tablet (10 mg total) by mouth daily at 6 PM. 10/28/17  Yes Wardell Honour, MD  cyclobenzaprine (FLEXERIL) 10 MG tablet TAKE 1/2-1 TABLET BY MOUTH 3 TIMES DAILY AS NEEDED FOR MUSCLE SPASMS. 01/14/18  Yes Wardell Honour, MD  darifenacin (ENABLEX) 15 MG 24 hr tablet Take 1 tablet (15 mg total) by mouth daily. 10/28/17  Yes Wardell Honour, MD  dexlansoprazole (DEXILANT) 60 MG capsule Take 1 capsule (60 mg total) by mouth daily. 10/28/17  Yes Wardell Honour, MD  docusate sodium (COLACE) 100 MG capsule TAKE 1 CAPSULE TWO TIMES DAILY 07/09/17  Yes [provider]  DULoxetine (CYMBALTA) 60 MG capsule  Take 1 capsule (60 mg total) by mouth daily. 02/20/18  Yes Wardell Honour, MD  gabapentin (NEURONTIN) 300 MG capsule Take 1-2 capsules (300-600 mg total) by mouth 4 (four) times daily. 02/20/18  Yes Wardell Honour, MD  lisinopril (PRINIVIL,ZESTRIL) 20 MG tablet Take 1 tablet (20 mg total) by mouth daily. 10/28/17  Yes Wardell Honour, MD  ondansetron (ZOFRAN-ODT) 4 MG disintegrating tablet DISSOLVE 1 TABLET BY MOUTH EVERY 8 HOURS AS NEEDED FOR NAUSEA OR VOMITING. 02/20/18  Yes Wardell Honour, MD  oxyCODONE-acetaminophen (PERCOCET/ROXICET) 5-325 MG tablet Take 1 tablet by mouth every 12 (twelve) hours as needed for severe pain. 10/19/17  Yes Wardell Honour, MD  propranolol (INDERAL) 60 MG tablet Take 1 tablet (60 mg total) 3 (three) times daily by mouth. 06/11/17  Yes Leonie Man, MD  ranitidine (ZANTAC) 150 MG tablet Take 1 tablet (150 mg total) by mouth at bedtime. 10/28/17  Yes Wardell Honour, MD  sertraline (ZOLOFT) 50 MG tablet Take 1 tablet (50 mg total) by mouth daily. 02/20/18  Yes Wardell Honour, MD  topiramate (TOPAMAX) 50 MG tablet TAKE 1 TABLET (50 MG TOTAL) BY MOUTH 2 (TWO) TIMES DAILY. 01/27/18  Yes Wardell Honour, MD    Past Medical History:  Diagnosis Date  . Allergy    [name], Flonase  . Anemia   . Anxiety   . Arthritis   . Chronic kidney disease   . Colon polyps   . Depression   . Fibromyalgia   . Gastritis   . GERD (gastroesophageal reflux disease)   . Hernia, hiatal   . HLD (hyperlipidemia)   . Hypertension   . Migraine   . Neuropathy   . Osteoporosis   . Pneumonia   . Sleep apnea with use of continuous positive airway pressure (CPAP)   . Thyroid goiter     Past Surgical History:  Procedure Laterality Date  . ABDOMINAL HYSTERECTOMY     ovaries intact; DUB.  No dysplasia.  Marland Kitchen JOINT REPLACEMENT    . OPEN REDUCTION INTERNAL FIXATION (ORIF) DISTAL RADIAL FRACTURE Right 07/09/2017   Procedure: RIGHT WRIST OPEN REDUCTION INTERNAL FIXATION (ORIF) DISTAL RADIAL  FRACTURE AND REPAIR AS INDICATED;  Surgeon: Iran Planas, MD;  Location: [facility];  Service: Orthopedics;  Laterality: Right;  . PARTIAL HYSTERECTOMY    . ROTATOR CUFF REPAIR Right 2009  . SHOULDER SURGERY Right    shoulder dislocation, fall, and elbow unla nerve  . SHOULDER SURGERY Right 2015   labral tear  . TUBAL LIGATION    . ULNAR NERVE REPAIR Right 08/19/2014    Social History  Tobacco Use  . Smoking status: Former Smoker    Last attempt to quit: 08/04/1998    Years since quitting: 19.8  . Smokeless tobacco: Never Used  Substance Use Topics  . Alcohol use: No    Family History  Problem Relation Age of Onset  . Heart disease Mother 24       AMI/CAD/CHF as cause of death  . Hypertension Mother   . Hyperlipidemia Mother   . Hypothyroidism Mother   . Depression Mother   . Gout Mother   . Heart disease Father 62       AMI  . Hypertension Father   . Stroke Father 48       mild CVA  . Prostate cancer Father   . Hyperlipidemia Father   . Cancer Father 26       prostate cancer  . Hypertension Brother   . Hyperlipidemia Brother   . Cancer Brother        prostate cancer  . Cancer Maternal Grandmother        type unknown  . Cancer Maternal Grandfather        type unknown  . Heart disease Paternal Grandmother   . Heart defect Paternal Grandfather   . Melanoma Brother   . Hyperlipidemia Brother   . Hypertension Brother   . Cancer Brother        melanoma  . Hypertension Brother   . Hyperlipidemia Brother   . Melanoma Brother   . Cancer Brother 25       melanoma  . Cancer Sister        Basal cell carcinoma scalp  . Depression Sister   . Hypertension Brother   . Depression Brother   . Hypertension Brother   . Gout Brother   . Arthritis Sister   . Depression Sister     Review of Systems  Constitutional: Negative for chills and fever.  Respiratory: Positive for shortness of breath. Negative for cough.   Cardiovascular: Negative for chest pain, palpitations  and leg swelling.  Gastrointestinal: Positive for heartburn and nausea. Negative for abdominal pain and vomiting.  Musculoskeletal: Positive for joint pain, myalgias and neck pain.  per hpi   OBJECTIVE:  Blood pressure 118/82, pulse 60, temperature 98.5 F (36.9 C), temperature source Oral, height 5\' 2"  (1.575 m), weight 260 lb 12.8 oz (118.3 kg), SpO2 96 %. Body mass index is 47.7 kg/m.   Wt Readings from Last 3 Encounters:  06/14/18 260 lb 12.8 oz (118.3 kg)  04/27/18 259 lb (117.5 kg)  03/23/18 263 lb 6.4 oz (119.5 kg)    Physical Exam  Constitutional: She is oriented to person, place, and time. She appears well-developed and well-nourished.  HENT:  Head: Normocephalic and atraumatic.  Mouth/Throat: Oropharynx is clear and moist. No oropharyngeal exudate.  Eyes: Pupils are equal, round, and reactive to light. Conjunctivae and EOM are normal. No scleral icterus.  Neck: Neck supple.  Cardiovascular: Normal rate, regular rhythm and normal heart sounds. Exam reveals no gallop and no friction rub.  No murmur heard. Pulmonary/Chest: Effort normal and breath sounds normal. She has no wheezes. She has no rales.  Musculoskeletal: She exhibits no edema.  Neurological: She is alert and oriented to person, place, and time.  Skin: Skin is warm and dry.  Psychiatric: She has a normal mood and affect.  Nursing note and vitals reviewed.   ASSESSMENT and PLAN  1. Essential hypertension Controlled. Continue current regime.   2. Gastroesophageal reflux disease without  esophagitis Controlled. Continue current regime.   3. Fibromyalgia Controlled. Continue current regime.   4. Chronic pain syndrome pmp reviewed. Percocet refilled, to take sporadically prn, med r/se/b  5. Class 3 severe obesity due to excess calories with serious comorbidity and body mass index (BMI) of 40.0 to 44.9 in adult [facility]) Discussed importance of low carb diet, regular exercise and healthy weight.   6.  Dyspnea on exertion Continue with walking program and developing endurance.  Neg pulm eval Make appt with cards  Other orders - docusate sodium (COLACE) 100 MG capsule; TAKE 1 CAPSULE TWO TIMES DAILY - oxyCODONE-acetaminophen (PERCOCET/ROXICET) 5-325 MG tablet; Take 1 tablet by mouth every 12 (twelve) hours as needed for severe pain. - dexlansoprazole (DEXILANT) 60 MG capsule; Take 1 capsule (60 mg total) by mouth daily.  Return in about 3 months (around 09/14/2018) for chronic medical conditions.    Rutherford Guys, MD Primary Care at [place], [place] [address] Ph.  [phone] Fax [phone]

## 2018-06-18 MED FILL — SERTRALINE HCL 50 MG TABLET: 50 | 90 days supply | Qty: 90 | Fill #0

## 2018-06-18 MED FILL — ONDANSETRON ODT 4 MG TABLET: 4 | 7 days supply | Qty: 20 | Fill #4

## 2018-06-18 MED FILL — DULoxetine HCL 60 MG CPEP: 60 | 90 days supply | Qty: 90 | Fill #0

## 2018-06-21 ENCOUNTER — Telehealth: Payer: Self-pay | Admitting: Family Medicine

## 2018-06-21 MED ORDER — OXYBUTYNIN CHLORIDE ER 10 MG PO TB24: 10.0000 mg | ORAL_TABLET | Freq: Every day | ORAL | 1 refills | Status: DC

## 2018-06-21 NOTE — Telephone Encounter (Signed)
Please let patient know that her insurance is wanting to know if she has every used anything for her bladder other than current medication. It seems to me that she came to PCP in 2016 already on this medication and per those notes she has never seen urology. Her insurance will not cover her current medication this way and I will therefore send a different medication in. Oxybutynin 10mg  once a day thanks

## 2018-06-21 NOTE — Telephone Encounter (Signed)
The insurance company is needing more information about the prescribed medication Darifenacin ER 15 mg. Questionnaire placed in the providers box at nurses station.

## 2018-06-22 MED FILL — OXYBUTYNIN CL ER 10 MG TAB: 10 | 90 days supply | Qty: 90 | Fill #0

## 2018-06-22 NOTE — Telephone Encounter (Signed)
Spoke with patient and advised prescription is at pharmacy Voiced understanding

## 2018-08-23 MED FILL — LISINOPRIL 20 MG TABLET: 20 | 90 days supply | Qty: 90 | Fill #3

## 2018-08-23 MED FILL — ATORVASTATIN 10 MG TABLET: 10 | 90 days supply | Qty: 90 | Fill #3

## 2018-08-26 ENCOUNTER — Other Ambulatory Visit: Payer: Self-pay | Admitting: Cardiology

## 2018-08-26 ENCOUNTER — Other Ambulatory Visit: Payer: Self-pay | Admitting: Family Medicine

## 2018-08-26 MED ORDER — CYCLOBENZAPRINE HCL 10 MG PO TABS: ORAL_TABLET | ORAL | 1 refills | Status: DC

## 2018-08-26 MED ORDER — TOPIRAMATE 50 MG PO TABS: 50.0000 mg | ORAL_TABLET | Freq: Two times a day (BID) | ORAL | 1 refills | Status: DC

## 2018-08-26 MED ORDER — ONDANSETRON 4 MG PO TBDP: ORAL_TABLET | ORAL | 4 refills | Status: DC

## 2018-08-26 MED ORDER — RANITIDINE HCL 150 MG PO TABS: 150.0000 mg | ORAL_TABLET | Freq: Every day | ORAL | 0 refills | Status: DC

## 2018-08-26 MED ORDER — PROPRANOLOL HCL 60 MG PO TABS: 60.0000 mg | ORAL_TABLET | Freq: Three times a day (TID) | ORAL | 0 refills | Status: DC

## 2018-08-26 MED FILL — PROPRANOLOL 60 MG TABLET: 60 | 30 days supply | Qty: 90 | Fill #0

## 2018-08-26 NOTE — Telephone Encounter (Signed)
Copied from [place] [id number]. Topic: Quick Communication - Rx Refill/Question >> Aug 26, 2018  2:25 PM Reyne Dumas L wrote: Medication:  cyclobenzaprine (FLEXERIL) 10 MG tablet  ranitidine (ZANTAC) 150 MG tablet topiramate (TOPAMAX) 50 MG tablet ondansetron (ZOFRAN-ODT) 4 MG disintegrating tablet  Has the patient contacted their pharmacy? Yes - states that pharmacy is trying to refill under Dr. Tamala Julian with no success.  Pt now sees Dr. Pamella Pert and would like new scripts called in. (Agent: If no, request that the patient contact the pharmacy for the refill.) (Agent: If yes, when and what did the pharmacy advise?)  Preferred Pharmacy (with phone number or street name): [place], [place] - [address]. [phone] (Phone) [phone] (Fax)  Agent: Please be advised that RX refills may take up to 3 business days. We ask that you follow-up with your pharmacy.

## 2018-08-26 NOTE — Telephone Encounter (Signed)
Requested medication (s) are due for refill today: yes  Requested medication (s) are on the active medication list: yes    Last refill:     Zofran 02/20/18     Topamaz  01/27/18       Flexeril   01/14/18  Future visit scheduled yes 09/17/2018  Dr. Pamella Pert  Notes to clinic:not delegated  Requested Prescriptions  Pending Prescriptions Disp Refills   cyclobenzaprine (FLEXERIL) 10 MG tablet 180 tablet 1     Not Delegated - Analgesics:  Muscle Relaxants Failed - 08/26/2018  2:29 PM      Failed - This refill cannot be delegated      Passed - Valid encounter within last 6 months    Recent Outpatient Visits          2 months ago Essential hypertension   [facility] at [facility], Lilia Argue, MD   6 months ago Routine physical examination   Primary Care at [facility], Renette Butters, MD   8 months ago Lower respiratory tract infection   Primary Care at [facility], Lilia Argue, MD   8 months ago Lower respiratory tract infection   [facility], Lilia Argue, MD   8 months ago Cough   [facility] at [facility], Lilia Argue, MD      Future Appointments            In 3 weeks Rutherford Guys, MD Primary Care at [place], [facility]          topiramate (TOPAMAX) 50 MG tablet 180 tablet 1    Sig: Take 1 tablet (50 mg total) by mouth 2 (two) times daily.     Not Delegated - Neurology: Anticonvulsants - topiramate & zonisamide Failed - 08/26/2018  2:29 PM      Failed - This refill cannot be delegated      Passed - Cr in normal range and within 360 days    Creat  Date Value Ref Range Status  05/21/2016 1.01 (H) 0.50 - 0.99 mg/dL Final    Comment:      For patients > or = 63 years of age: The upper reference limit for Creatinine is approximately 13% higher for people identified as African-American.      Creatinine, Ser  Date Value Ref Range Status  03/23/2018 1.10 0.40 - 1.20 mg/dL Final         Passed - C02 in normal range and within 360 days    CO2  Date Value Ref Range  Status  03/23/2018 27 19 - 32 mEq/L Final         Passed - Valid encounter within last 12 months    Recent Outpatient Visits          2 months ago Essential hypertension   [facility], Lilia Argue, MD   6 months ago Routine physical examination   Primary Care at [facility], Renette Butters, MD   8 months ago Lower respiratory tract infection   [facility] at [facility], Lilia Argue, MD   8 months ago Lower respiratory tract infection   [facility] at [facility], Lilia Argue, MD   8 months ago Cough   [facility] at [facility], Lilia Argue, MD      Future Appointments            In 3 weeks Rutherford Guys, MD Primary Care at [place], [facility]  ondansetron (ZOFRAN-ODT) 4 MG disintegrating tablet 20 tablet 4    Sig: DISSOLVE 1 TABLET BY MOUTH EVERY 8 HOURS AS NEEDED FOR NAUSEA OR VOMITING.     Not Delegated - Gastroenterology: Antiemetics Failed - 08/26/2018  2:29 PM      Failed - This refill cannot be delegated      Passed - Valid encounter within last 6 months    Recent Outpatient Visits          2 months ago Essential hypertension   [facility] at [facility], Lilia Argue, MD   6 months ago Routine physical examination   Primary Care at [facility], Renette Butters, MD   8 months ago Lower respiratory tract infection   [facility], Lilia Argue, MD   8 months ago Lower respiratory tract infection   [facility], Lilia Argue, MD   8 months ago Cough   [facility] at [facility], Lilia Argue, MD      Future Appointments            In 3 weeks Rutherford Guys, MD Primary Care at [place], [facility]         Signed Prescriptions Disp Refills   ranitidine (ZANTAC) 150 MG tablet 90 tablet 0    Sig: Take 1 tablet (150 mg total) by mouth at bedtime.     Gastroenterology:  H2 Antagonists Passed - 08/26/2018  2:29 PM      Passed - Valid encounter within last 12 months    Recent Outpatient Visits          2 months ago Essential  hypertension   [facility] at [facility], Lilia Argue, MD   6 months ago Routine physical examination   Primary Care at [facility], Renette Butters, MD   8 months ago Lower respiratory tract infection   [facility] at [facility], Lilia Argue, MD   8 months ago Lower respiratory tract infection   [facility] at [facility], Lilia Argue, MD   8 months ago Cough   [facility] Care at [facility], Lilia Argue, MD      Future Appointments            In 3 weeks Rutherford Guys, MD Primary Care at [place], [facility]

## 2018-08-26 NOTE — Telephone Encounter (Signed)
Please advise 

## 2018-08-26 NOTE — Telephone Encounter (Signed)
°*  STAT* If patient is at the pharmacy, call can be transferred to refill team.   1. Which medications need to be refilled? (please list name of each medication and dose if known)   propranolol (INDERAL) 60 MG tablet   2. Which pharmacy/location (including street and city if local pharmacy) is medication to be sent to?  [place], [place].   3. Do they need a 30 day or 90 day supply?  90 days

## 2018-08-27 MED FILL — ONDANSETRON ODT 4 MG TABLET: 4 | 7 days supply | Qty: 20 | Fill #0

## 2018-08-27 MED FILL — CYCLOBENZAPRINE 10 MG TAB: 10 | 60 days supply | Qty: 180 | Fill #0

## 2018-08-27 MED FILL — TOPIRAMATE 50 MG TABLET: 50 | 90 days supply | Qty: 180 | Fill #0

## 2018-08-31 ENCOUNTER — Telehealth: Payer: Self-pay | Admitting: Family Medicine

## 2018-08-31 NOTE — Telephone Encounter (Signed)
Copied from [place] [id number]. Topic: Quick Communication - Rx Refill/Question >> Aug 31, 2018  4:54 PM Alanda Slim E wrote: Medication: ranitidine (ZANTAC) 150 MG tablet  Has the patient contacted their pharmacy? Yes ( Medication is on recall/ please send another alternative med for Pt to take)   Preferred Pharmacy (with phone number or street name): [place], [place] - [address]. [phone] (Phone) [phone] (Fax)    Agent: Please be advised that RX refills may take up to 3 business days. We ask that you follow-up with your pharmacy.

## 2018-09-01 MED ORDER — FAMOTIDINE 20 MG PO TABS: 20.0000 mg | ORAL_TABLET | Freq: Every day | ORAL | 1 refills | Status: DC

## 2018-09-01 MED FILL — FAMOTIDINE 20 MG TABS: 20 | 90 days supply | Qty: 90 | Fill #0

## 2018-09-01 NOTE — Telephone Encounter (Signed)
Can we send in an alternative

## 2018-09-17 ENCOUNTER — Encounter: Payer: Self-pay | Admitting: Family Medicine

## 2018-09-17 ENCOUNTER — Other Ambulatory Visit: Payer: Self-pay

## 2018-09-17 ENCOUNTER — Ambulatory Visit (INDEPENDENT_AMBULATORY_CARE_PROVIDER_SITE_OTHER): Payer: 59 | Admitting: Family Medicine

## 2018-09-17 VITALS — BP 117/64 | HR 69 | Temp 98.0°F | Resp 16 | Ht 62.0 in | Wt 258.0 lb

## 2018-09-17 DIAGNOSIS — I10 Essential (primary) hypertension: Secondary | ICD-10-CM | POA: Diagnosis not present

## 2018-09-17 DIAGNOSIS — R0609 Other forms of dyspnea: Secondary | ICD-10-CM | POA: Diagnosis not present

## 2018-09-17 DIAGNOSIS — R7303 Prediabetes: Secondary | ICD-10-CM | POA: Diagnosis not present

## 2018-09-17 DIAGNOSIS — E7800 Pure hypercholesterolemia, unspecified: Secondary | ICD-10-CM

## 2018-09-17 DIAGNOSIS — N189 Chronic kidney disease, unspecified: Secondary | ICD-10-CM | POA: Diagnosis not present

## 2018-09-17 LAB — LIPID PANEL
Chol/HDL Ratio: 2.6 ratio (ref 0.0–4.4)
Cholesterol, Total: 163 mg/dL (ref 100–199)
HDL: 63 mg/dL (ref >39)
LDL Calculated: 72 mg/dL (ref 0–99)
Triglycerides: 142 mg/dL (ref 0–149)
VLDL Cholesterol Cal: 28 mg/dL (ref 5–40)

## 2018-09-17 LAB — COMPREHENSIVE METABOLIC PANEL
ALT: 11 IU/L (ref 0–32)
AST: 18 IU/L (ref 0–40)
Albumin/Globulin Ratio: 1.7 (ref 1.2–2.2)
Albumin: 4.3 g/dL (ref 3.8–4.8)
Alkaline Phosphatase: 85 IU/L (ref 39–117)
BUN/Creatinine Ratio: 12 (ref 12–28)
BUN: 12 mg/dL (ref 8–27)
Bilirubin Total: <0.2 mg/dL (ref 0.0–1.2)
CO2: 21 mmol/L (ref 20–29)
Calcium: 9 mg/dL (ref 8.7–10.3)
Chloride: 107 mmol/L — ABNORMAL HIGH (ref 96–106)
Creatinine, Ser: 0.97 mg/dL (ref 0.57–1.00)
GFR calc Af Amer: 72 mL/min/{1.73_m2} (ref >59)
GFR calc non Af Amer: 63 mL/min/{1.73_m2} (ref >59)
Globulin, Total: 2.6 g/dL (ref 1.5–4.5)
Glucose: 94 mg/dL (ref 65–99)
Potassium: 4.4 mmol/L (ref 3.5–5.2)
Sodium: 141 mmol/L (ref 134–144)
Total Protein: 6.9 g/dL (ref 6.0–8.5)

## 2018-09-17 LAB — HEMOGLOBIN A1C
Est. average glucose Bld gHb Est-mCnc: 126 mg/dL
Hgb A1c MFr Bld: 6 % — ABNORMAL HIGH (ref 4.8–5.6)

## 2018-09-17 NOTE — Progress Notes (Signed)
2/14/202011:49 AM  [name] [birth year], 63 y.o. female [id number]  Chief Complaint  Patient presents with  . Chronic Condition    3 month follow-up     HPI:   Patient is a 63 y.o. female with past medical history significant for HTN, HLP, dCHF, OSA on cpap, morbid obesity, GERD, pre-diabetes who presents today for routine followup  Last OV Nov 2019  Has yet been able to see cards Cont with DOE, having issues with going up and down her steps in her home, showering, walking from one side of her house to the other Denies any chest pain, edema Echo cards note from 06/2017 - grade 1 DD, normal for age, not cause of symptoms  Has right handed loss of sensation and grip after fractures Right handed Not really engaged, limited social interactions  Lab Results  Component Value Date   HGBA1C 6.0 (H) 02/19/2018   HGBA1C 5.9 (H) 01/09/2017   HGBA1C  03/10/2008    5.5 (NOTE)   The ADA recommends the following therapeutic goal for glycemic   control related to Hgb A1C measurement:   Goal of Therapy:   < 7.0% Hgb A1C   Reference: American Diabetes Association: Clinical Practice   Recommendations 2008, Diabetes Care,  2008, 31:(Suppl 1).   Lab Results  Component Value Date   MICROALBUR 1.2 05/28/2015   LDLCALC 84 02/19/2018   CREATININE 1.10 03/23/2018     Fall Risk  09/17/2018 06/14/2018 02/19/2018 12/29/2017 12/15/2017  Falls in the past year? 0 0 No No Yes  Number falls in past yr: - - - - 1  Injury with Fall? 0 - - - Yes  Comment - - - - wrist surgery     Depression screen [facility] 2/9 09/17/2018 02/19/2018 12/29/2017  Decreased Interest 3 0 0  Down, Depressed, Hopeless 3 0 0  PHQ - 2 Score 6 0 0  Altered sleeping 3 - -  Tired, decreased energy 1 - -  Change in appetite 0 - -  Feeling bad or failure about yourself  1 - -  Trouble concentrating 0 - -  Moving slowly or fidgety/restless 0 - -  Suicidal thoughts 0 - -  PHQ-9 Score 11 - -  Difficult doing work/chores - - -    Some recent data might be hidden    Allergies  Allergen Reactions  . Sulfa Antibiotics Itching    Prior to Admission medications   Medication Sig Start Date End Date Taking? Authorizing Provider  Albuterol Sulfate (PROAIR RESPICLICK) 324 (90 Base) MCG/ACT AEPB Inhale 2 puffs into the lungs 4 (four) times daily as needed. 12/10/17  Yes Rutherford Guys, MD  atorvastatin (LIPITOR) 10 MG tablet Take 1 tablet (10 mg total) by mouth daily at 6 PM. 10/28/17  Yes Wardell Honour, MD  cyclobenzaprine (FLEXERIL) 10 MG tablet TAKE 1/2-1 TABLET BY MOUTH 3 TIMES DAILY AS NEEDED FOR MUSCLE SPASMS. 08/26/18  Yes Rutherford Guys, MD  dexlansoprazole (DEXILANT) 60 MG capsule Take 1 capsule (60 mg total) by mouth daily. 06/14/18  Yes Rutherford Guys, MD  docusate sodium (COLACE) 100 MG capsule TAKE 1 CAPSULE TWO TIMES DAILY 06/14/18  Yes Rutherford Guys, MD  DULoxetine (CYMBALTA) 60 MG capsule Take 1 capsule (60 mg total) by mouth daily. 02/20/18  Yes Wardell Honour, MD  famotidine (PEPCID) 20 MG tablet Take 1 tablet (20 mg total) by mouth at bedtime. 09/01/18  Yes Rutherford Guys, MD  gabapentin (NEURONTIN) 300  MG capsule Take 1-2 capsules (300-600 mg total) by mouth 4 (four) times daily. 02/20/18  Yes Wardell Honour, MD  lisinopril (PRINIVIL,ZESTRIL) 20 MG tablet Take 1 tablet (20 mg total) by mouth daily. 10/28/17  Yes Wardell Honour, MD  ondansetron (ZOFRAN-ODT) 4 MG disintegrating tablet DISSOLVE 1 TABLET BY MOUTH EVERY 8 HOURS AS NEEDED FOR NAUSEA OR VOMITING. 08/26/18  Yes Rutherford Guys, MD  oxybutynin (DITROPAN XL) 10 MG 24 hr tablet Take 1 tablet (10 mg total) by mouth at bedtime. 06/21/18  Yes Rutherford Guys, MD  oxyCODONE-acetaminophen (PERCOCET/ROXICET) 5-325 MG tablet Take 1 tablet by mouth every 12 (twelve) hours as needed for severe pain. 06/14/18  Yes Rutherford Guys, MD  propranolol (INDERAL) 60 MG tablet Take 1 tablet (60 mg total) by mouth 3 (three) times daily. Please make overdue appt  for future refills. [phone]. 1st attempt. 08/26/18  Yes Leonie Man, MD  sertraline (ZOLOFT) 50 MG tablet Take 1 tablet (50 mg total) by mouth daily. 02/20/18  Yes Wardell Honour, MD  topiramate (TOPAMAX) 50 MG tablet Take 1 tablet (50 mg total) by mouth 2 (two) times daily. 08/26/18  Yes Rutherford Guys, MD    Past Medical History:  Diagnosis Date  . Allergy    [name], Flonase  . Anemia   . Anxiety   . Arthritis   . Chronic kidney disease   . Colon polyps   . Depression   . Fibromyalgia   . Gastritis   . GERD (gastroesophageal reflux disease)   . Hernia, hiatal   . HLD (hyperlipidemia)   . Hypertension   . Migraine   . Neuropathy   . Osteoporosis   . Pneumonia   . Sleep apnea with use of continuous positive airway pressure (CPAP)   . Thyroid goiter     Past Surgical History:  Procedure Laterality Date  . ABDOMINAL HYSTERECTOMY     ovaries intact; DUB.  No dysplasia.  Marland Kitchen JOINT REPLACEMENT    . OPEN REDUCTION INTERNAL FIXATION (ORIF) DISTAL RADIAL FRACTURE Right 07/09/2017   Procedure: RIGHT WRIST OPEN REDUCTION INTERNAL FIXATION (ORIF) DISTAL RADIAL FRACTURE AND REPAIR AS INDICATED;  Surgeon: Iran Planas, MD;  Location: [place];  Service: Orthopedics;  Laterality: Right;  . PARTIAL HYSTERECTOMY    . ROTATOR CUFF REPAIR Right 2009  . SHOULDER SURGERY Right    shoulder dislocation, fall, and elbow unla nerve  . SHOULDER SURGERY Right 2015   labral tear  . TUBAL LIGATION    . ULNAR NERVE REPAIR Right 08/19/2014    Social History   Tobacco Use  . Smoking status: Former Smoker    Last attempt to quit: 08/04/1998    Years since quitting: 20.1  . Smokeless tobacco: Never Used  Substance Use Topics  . Alcohol use: No    Family History  Problem Relation Age of Onset  . Heart disease Mother 68       AMI/CAD/CHF as cause of death  . Hypertension Mother   . Hyperlipidemia Mother   . Hypothyroidism Mother   . Depression Mother   . Gout Mother   . Heart  disease Father 61       AMI  . Hypertension Father   . Stroke Father 23       mild CVA  . Prostate cancer Father   . Hyperlipidemia Father   . Cancer Father 32       prostate cancer  . Hypertension Brother   .  Hyperlipidemia Brother   . Cancer Brother        prostate cancer  . Cancer Maternal Grandmother        type unknown  . Cancer Maternal Grandfather        type unknown  . Heart disease Paternal Grandmother   . Heart defect Paternal Grandfather   . Melanoma Brother   . Hyperlipidemia Brother   . Hypertension Brother   . Cancer Brother        melanoma  . Hypertension Brother   . Hyperlipidemia Brother   . Melanoma Brother   . Cancer Brother 25       melanoma  . Cancer Sister        Basal cell carcinoma scalp  . Depression Sister   . Hypertension Brother   . Depression Brother   . Hypertension Brother   . Gout Brother   . Arthritis Sister   . Depression Sister     Review of Systems  Constitutional: Negative for chills and fever.  Respiratory: Positive for shortness of breath. Negative for cough.   Cardiovascular: Negative for chest pain, palpitations and leg swelling.  Gastrointestinal: Negative for abdominal pain, nausea and vomiting.     OBJECTIVE:  Blood pressure 117/64, pulse 69, temperature 98 F (36.7 C), temperature source Oral, resp. rate 16, height 5\' 2"  (1.575 m), weight 258 lb (117 kg), SpO2 97 %. Body mass index is 47.19 kg/m.   Wt Readings from Last 3 Encounters:  09/17/18 258 lb (117 kg)  06/14/18 260 lb 12.8 oz (118.3 kg)  04/27/18 259 lb (117.5 kg)    Physical Exam Vitals signs and nursing note reviewed.  Constitutional:      Appearance: She is well-developed.  HENT:     Head: Normocephalic and atraumatic.     Mouth/Throat:     Pharynx: No oropharyngeal exudate.  Eyes:     General: No scleral icterus.    Conjunctiva/sclera: Conjunctivae normal.     Pupils: Pupils are equal, round, and reactive to light.  Neck:      Musculoskeletal: Neck supple.  Cardiovascular:     Rate and Rhythm: Normal rate and regular rhythm.     Heart sounds: Normal heart sounds. No murmur. No friction rub. No gallop.   Pulmonary:     Effort: Pulmonary effort is normal.     Breath sounds: Normal breath sounds. No wheezing or rales.  Musculoskeletal:     Right lower leg: No edema.     Left lower leg: No edema.  Skin:    General: Skin is warm and dry.  Neurological:     Mental Status: She is alert and oriented to person, place, and time.     ASSESSMENT and PLAN  1. Essential hypertension Controlled. Continue current regime.  - Comprehensive metabolic panel  2. Chronic kidney disease, unspecified CKD stage Checking labs today  3. Pure hypercholesterolemia Checking labs today, medications will be adjusted as needed.  - Lipid panel  4. Pre-diabetes Checking labs today, medications will be adjusted as needed. Cont with LFM - Hemoglobin A1c  5. Dyspnea on exertion Chronic. Unchanged. Very limiting in her daily function. Neg pulm workup. Echo unremarkable. Deconditioning? Would she benefit from cardiac rehab. I have asked her to discuss cardiology.   Return in about 6 months (around 03/18/2019).    Rutherford Guys, MD Primary [facility] at [place], [place] [address] Ph.  [phone] Fax [phone]

## 2018-09-17 NOTE — Patient Instructions (Addendum)
  Please discuss with your cardiologist if he thinks you would benefit from cardiovascular physical therapy   If you have lab work done today you will be contacted with your lab results within the next 2 weeks.  If you have not heard from [place] then please contact us. The fastest way to get your results is to register for My Chart.   IF you received an x-ray today, you will receive an invoice from [facility]. Please contact [facility] at [phone] with questions or concerns regarding your invoice.   IF you received labwork today, you will receive an invoice from [facility]. Please contact [facility] at [phone] with questions or concerns regarding your invoice.   Our billing staff will not be able to assist you with questions regarding bills from these companies.  You will be contacted with the lab results as soon as they are available. The fastest way to get your results is to activate your My Chart account. Instructions are located on the last page of this paperwork. If you have not heard from [place] regarding the results in 2 weeks, please contact this office.

## 2018-09-29 ENCOUNTER — Ambulatory Visit (INDEPENDENT_AMBULATORY_CARE_PROVIDER_SITE_OTHER): Payer: 59 | Admitting: Adult Health

## 2018-09-29 ENCOUNTER — Encounter: Payer: Self-pay | Admitting: Adult Health

## 2018-09-29 VITALS — BP 110/78 | HR 65 | Ht 64.0 in | Wt 260.4 lb

## 2018-09-29 DIAGNOSIS — I10 Essential (primary) hypertension: Secondary | ICD-10-CM | POA: Diagnosis not present

## 2018-09-29 DIAGNOSIS — E7800 Pure hypercholesterolemia, unspecified: Secondary | ICD-10-CM | POA: Diagnosis not present

## 2018-09-29 DIAGNOSIS — R0602 Shortness of breath: Secondary | ICD-10-CM

## 2018-09-29 MED ORDER — PROPRANOLOL HCL 60 MG PO TABS: 60.0000 mg | ORAL_TABLET | Freq: Three times a day (TID) | ORAL | 2 refills | Status: DC

## 2018-09-29 MED FILL — PROPRANOLOL 60 MG TABLET: 60 | 90 days supply | Qty: 270 | Fill #0

## 2018-09-29 NOTE — Patient Instructions (Signed)
Testing: Echocardiogram - Your physician has requested that you have an echocardiogram. Echocardiography is a painless test that uses sound waves to create images of your heart. It provides your doctor with information about the size and shape of your heart and how well your heart's chambers and valves are working. This procedure takes approximately one hour. There are no restrictions for this procedure. This will be performed at our [facility] location - [address].  Follow-Up: You will need a follow up appointment AFTER ECHO WITH Glenetta Hew, MD ONLY or one of the following Advanced Practice Providers on your designated Care Team:  Rosaria Ferries, PA-C, Jory Sims, DNP, AACC   Medication Instructions:  NO CHANGES- Your physician recommends that you continue on your current medications as directed. Please refer to the Current Medication list given to you today. If you need a refill on your cardiac medications before your next appointment, please call your pharmacy. Labwork: When you have labs (blood work) and your tests are completely normal, you will receive your results ONLY by [facility] (if you have MyChart) -OR- A paper copy in the mail.  At [facility], you and your health needs are our priority.  As part of our continuing mission to provide you with exceptional heart care, we have created designated Provider Care Teams.  These Care Teams include your primary Cardiologist (physician) and Advanced Practice Providers (APPs -  Physician Assistants and Nurse Practitioners) who all work together to provide you with the care you need, when you need it.  Thank you for choosing [facility]!!

## 2018-09-29 NOTE — Progress Notes (Signed)
Cardiology Office Note   Date:  09/29/2018   ID:  [name] 03/01/1956, MRN [id number]  PCP:  Rutherford Guys, MD  Cardiologist:  Dr. Ellyn Hack  No chief complaint on file.    History of Present Illness: [name] is a 63 y.o. female who presents for ongoing assessment and management of tachycardia, hypercholesterolemia,and hypertension.  Holter monitor in 2018 revealed mostly normal sinus with HR of 71 bpm. She also had chest wall pain. On last office visit with Dr Ellyn Hack on 08/14/2017.   She comes today with complaints of worsening DOE. She has been seen by pulmonology in August of 2019 and has had PFT's completed. She has been placed on an inhaler. She is morbidly obese and in active. She is also on CPAP for OSA, she states that she is compliant.  She is worried that her heart function is decreased causing her symptoms of dyspnea. She denies chest pain. She is chronically fatigued and has dizziness. She has had several falls causing injuries to both arms and is now on disability.    Past Medical History:  Diagnosis Date  . Allergy    [name], Flonase  . Anemia   . Anxiety   . Arthritis   . Chronic kidney disease   . Colon polyps   . Depression   . Fibromyalgia   . Gastritis   . GERD (gastroesophageal reflux disease)   . Hernia, hiatal   . HLD (hyperlipidemia)   . Hypertension   . Migraine   . Neuropathy   . Osteoporosis   . Pneumonia   . Sleep apnea with use of continuous positive airway pressure (CPAP)   . Thyroid goiter     Past Surgical History:  Procedure Laterality Date  . ABDOMINAL HYSTERECTOMY     ovaries intact; DUB.  No dysplasia.  Marland Kitchen JOINT REPLACEMENT    . OPEN REDUCTION INTERNAL FIXATION (ORIF) DISTAL RADIAL FRACTURE Right 07/09/2017   Procedure: RIGHT WRIST OPEN REDUCTION INTERNAL FIXATION (ORIF) DISTAL RADIAL FRACTURE AND REPAIR AS INDICATED;  Surgeon: Iran Planas, MD;  Location: [place];  Service: Orthopedics;  Laterality: Right;  . PARTIAL  HYSTERECTOMY    . ROTATOR CUFF REPAIR Right 2009  . SHOULDER SURGERY Right    shoulder dislocation, fall, and elbow unla nerve  . SHOULDER SURGERY Right 2015   labral tear  . TUBAL LIGATION    . ULNAR NERVE REPAIR Right 08/19/2014     Current Outpatient Medications  Medication Sig Dispense Refill  . Albuterol Sulfate (PROAIR RESPICLICK) 270 (90 Base) MCG/ACT AEPB Inhale 2 puffs into the lungs 4 (four) times daily as needed. 1 each 1  . atorvastatin (LIPITOR) 10 MG tablet Take 1 tablet (10 mg total) by mouth daily at 6 PM. 90 tablet 3  . cyclobenzaprine (FLEXERIL) 10 MG tablet TAKE 1/2-1 TABLET BY MOUTH 3 TIMES DAILY AS NEEDED FOR MUSCLE SPASMS. 180 tablet 1  . dexlansoprazole (DEXILANT) 60 MG capsule Take 1 capsule (60 mg total) by mouth daily. 90 capsule 3  . docusate sodium (COLACE) 100 MG capsule TAKE 1 CAPSULE TWO TIMES DAILY 30 capsule 2  . DULoxetine (CYMBALTA) 60 MG capsule Take 1 capsule (60 mg total) by mouth daily. 90 capsule 1  . famotidine (PEPCID) 20 MG tablet Take 1 tablet (20 mg total) by mouth at bedtime. 90 tablet 1  . gabapentin (NEURONTIN) 300 MG capsule Take 1-2 capsules (300-600 mg total) by mouth 4 (four) times daily. 540 capsule 1  .  lisinopril (PRINIVIL,ZESTRIL) 20 MG tablet Take 1 tablet (20 mg total) by mouth daily. 90 tablet 3  . ondansetron (ZOFRAN-ODT) 4 MG disintegrating tablet DISSOLVE 1 TABLET BY MOUTH EVERY 8 HOURS AS NEEDED FOR NAUSEA OR VOMITING. 20 tablet 4  . oxybutynin (DITROPAN XL) 10 MG 24 hr tablet Take 1 tablet (10 mg total) by mouth at bedtime. 90 tablet 1  . oxyCODONE-acetaminophen (PERCOCET/ROXICET) 5-325 MG tablet Take 1 tablet by mouth every 12 (twelve) hours as needed for severe pain. 30 tablet 0  . propranolol (INDERAL) 60 MG tablet Take 1 tablet (60 mg total) by mouth 3 (three) times daily. 270 tablet 2  . sertraline (ZOLOFT) 50 MG tablet Take 1 tablet (50 mg total) by mouth daily. 90 tablet 1  . topiramate (TOPAMAX) 50 MG tablet Take 1  tablet (50 mg total) by mouth 2 (two) times daily. 180 tablet 1   No current facility-administered medications for this visit.     Allergies:   Sulfa antibiotics    Social History:  The patient  reports that she quit smoking about 20 years ago. She has never used smokeless tobacco. She reports that she does not drink alcohol or use drugs.   Family History:  The patient's family history includes Arthritis in her sister; Cancer in her brother, brother, maternal grandfather, maternal grandmother, and sister; Cancer (age of onset: 44) in her brother; Cancer (age of onset: 31) in her father; Depression in her brother, mother, sister, and sister; Gout in her brother and mother; Heart defect in her paternal grandfather; Heart disease in her paternal grandmother; Heart disease (age of onset: 58) in her father; Heart disease (age of onset: 23) in her mother; Hyperlipidemia in her brother, brother, brother, father, and mother; Hypertension in her brother, brother, brother, brother, brother, father, and mother; Hypothyroidism in her mother; Melanoma in her brother and brother; Prostate cancer in her father; Stroke (age of onset: 45) in her father.    ROS: All other systems are reviewed and negative. Unless otherwise mentioned in H&P    PHYSICAL EXAM: VS:  BP 110/78   Pulse 65   Ht 5\' 4"  (1.626 m)   Wt 260 lb 6.4 oz (118.1 kg)   SpO2 98%   BMI 44.70 kg/m  , BMI Body mass index is 44.7 kg/m. GEN: Well nourished, well developed, in no acute distress.Morbidly obese.  HEENT: normal Neck: no JVD, carotid bruits, or masses Cardiac:RRR; no murmurs, rubs, or gallops,no edema  Respiratory:  Clear to auscultation bilaterally, normal work of breathing GI: soft, nontender, nondistended, + BS MS: no deformity or atrophy Skin: warm and dry, no rash Neuro:  Strength and sensation are intact Psych: euthymic mood, full affect   EKG: NSR rate of 65 bpm. Non-specific T wave abnormality.   Recent  Labs: 02/19/2018: Hemoglobin 10.5; Platelets 256; TSH 1.680 03/23/2018: NT-Pro BNP 148 09/17/2018: ALT 11; BUN 12; Creatinine, Ser 0.97; Potassium 4.4; Sodium 141    Lipid Panel    Component Value Date/Time   CHOL 163 09/17/2018 1224   TRIG 142 09/17/2018 1224   HDL 63 09/17/2018 1224   CHOLHDL 2.6 09/17/2018 1224   CHOLHDL 3.4 05/21/2016 1219   VLDL 13 05/21/2016 1219   LDLCALC 72 09/17/2018 1224      Wt Readings from Last 3 Encounters:  09/29/18 260 lb 6.4 oz (118.1 kg)  09/17/18 258 lb (117 kg)  06/14/18 260 lb 12.8 oz (118.3 kg)      Other studies Reviewed: Echocardiogram 2017-07-12  Left ventricle: The cavity size was normal. Wall thickness was   normal. Systolic function was normal. The estimated ejection   fraction was in the range of 55% to 60%. Wall motion was normal;   there were no regional wall motion abnormalities. Doppler   parameters are consistent with abnormal left ventricular   relaxation (grade 1 diastolic dysfunction). The E/e&' ratio is   between 8-15, suggesting indeterminate LV filling pressure. - Left atrium: The atrium was normal in size. - Inferior vena cava: The vessel was normal in size. The   respirophasic diameter changes were in the normal range (>= 50%),   consistent with normal central venous pressure.  Impressions:  - LVEF 55-60%, normal wall thickness, normal wall motion, grade 1   DD, indeterminate LV filling pressure, normal LA size, normal   IVC.   ASSESSMENT AND PLAN:  1. DOE: Will repeat her echo for changes in heart function leading to her symptoms. Obesity is playing a role in her breathing status and it is recommended that she become more active to increase her stamina and lose weight. She is afraid to do more activities until her heart is evaluated. She is also reluctant to walk or exercise for fear of recurrent falls.   She is being followed by a pulmonologist for ongoing management. Defer to them for changes in  medications to assist with breathing.   2. Hypercholesterolemia: She remains on statin therapy. Labs per PCP  3. Hypertension: BP is currently well controlled. No changes in regimen at this time. Lisinopril dose may be able to be decreased to assist with dizziness if BP remains low. Checking echo first.   Current medicines are reviewed at length with the patient today.    Labs/ tests ordered today include: Echocardiogram   Phill Myron. [place] Pugh, ANP, AACC   09/29/2018 [facility] [place] [address] Office [phone] Fax [phone]

## 2018-09-30 MED FILL — SERTRALINE HCL 50 MG TABLET: 50 | 90 days supply | Qty: 90 | Fill #1

## 2018-09-30 MED FILL — DEXILANT DR 60 MG CAPSULE: 60 | 90 days supply | Qty: 90 | Fill #1

## 2018-09-30 MED FILL — GABAPENTIN 300 MG CAPSULE: 300 | 68 days supply | Qty: 540 | Fill #1

## 2018-09-30 MED FILL — DULoxetine HCL 60 MG CPEP: 60 | 90 days supply | Qty: 90 | Fill #1

## 2018-10-04 ENCOUNTER — Other Ambulatory Visit (HOSPITAL_COMMUNITY): Payer: 59

## 2018-10-05 ENCOUNTER — Ambulatory Visit (HOSPITAL_COMMUNITY): Payer: 59 | Attending: Cardiovascular Disease

## 2018-10-05 DIAGNOSIS — R0602 Shortness of breath: Secondary | ICD-10-CM | POA: Insufficient documentation

## 2018-10-05 DIAGNOSIS — I10 Essential (primary) hypertension: Secondary | ICD-10-CM | POA: Diagnosis not present

## 2018-10-08 ENCOUNTER — Encounter: Payer: Self-pay | Admitting: Family Medicine

## 2018-10-08 ENCOUNTER — Other Ambulatory Visit: Payer: Self-pay

## 2018-10-08 ENCOUNTER — Ambulatory Visit (INDEPENDENT_AMBULATORY_CARE_PROVIDER_SITE_OTHER): Payer: 59 | Admitting: Family Medicine

## 2018-10-08 ENCOUNTER — Ambulatory Visit (INDEPENDENT_AMBULATORY_CARE_PROVIDER_SITE_OTHER): Payer: 59

## 2018-10-08 VITALS — BP 109/51 | HR 70 | Temp 98.5°F | Resp 16 | Ht 64.0 in | Wt 258.0 lb

## 2018-10-08 DIAGNOSIS — R5383 Other fatigue: Secondary | ICD-10-CM | POA: Diagnosis not present

## 2018-10-08 DIAGNOSIS — D649 Anemia, unspecified: Secondary | ICD-10-CM | POA: Diagnosis not present

## 2018-10-08 DIAGNOSIS — J069 Acute upper respiratory infection, unspecified: Secondary | ICD-10-CM

## 2018-10-08 DIAGNOSIS — R05 Cough: Secondary | ICD-10-CM

## 2018-10-08 DIAGNOSIS — Z8679 Personal history of other diseases of the circulatory system: Secondary | ICD-10-CM

## 2018-10-08 DIAGNOSIS — R059 Cough, unspecified: Secondary | ICD-10-CM

## 2018-10-08 LAB — POCT CBC
GRANULOCYTE PERCENT: 63.3 % (ref 37–80)
HCT, POC: 29.4 % (ref 29–41)
Hemoglobin: 9.1 g/dL — AB (ref 11–14.6)
Lymph, poc: 1.8 (ref 0.6–3.4)
MCH, POC: 23.1 pg — AB (ref 27–31.2)
MCHC: 30.9 g/dL — AB (ref 31.8–35.4)
MCV: 74.8 fL — AB (ref 76–111)
MID (cbc): 0.1 (ref 0–0.9)
MPV: 6.5 fL (ref 0–99.8)
POC Granulocyte: 3.4 (ref 2–6.9)
POC LYMPH PERCENT: 33.9 %L (ref 10–50)
POC MID %: 2.8 %M (ref 0–12)
Platelet Count, POC: 301 10*3/uL (ref 142–424)
RBC: 3.93 M/uL — AB (ref 4.04–5.48)
RDW, POC: 16.9 %
WBC: 5.3 10*3/uL (ref 4.6–10.2)

## 2018-10-08 MED ORDER — BENZONATATE 100 MG PO CAPS: 100.0000 mg | ORAL_CAPSULE | Freq: Two times a day (BID) | ORAL | 0 refills | Status: DC | PRN

## 2018-10-08 MED FILL — BENZONATATE 100 MG CAPS: 100 | 10 days supply | Qty: 20 | Fill #0

## 2018-10-08 NOTE — Progress Notes (Signed)
Subjective:    Patient ID: [name], female    DOB: 1956/04/05, 63 y.o.   MRN: [id number]  HPI [name] is a 63 y.o. female Presents today for: Chief Complaint  Patient presents with  . Cough    wheezing/ sunday  . Sore Throat    x sunday  . Fatigue    feel weak and dizzy/ x sunday  . Sinus Problem    watery eyes, sneezings, headaache/ x sunday   Symptoms above since 5 days ago. Cough, sore throat-, nasal congestion, fatigue.  No fever.  Persistent sx's. Initially worse on initial 3 days, but same past few days. Feels weak/dizzy today. Some more HA today. Granddaughter with cough about 1 and 1/2 weeks ago. Some wheezing for some time, no recent change this week. Seen by pulmonary - not thought to have asthma. Reassuring PFT's prior.   Tx: none - concerned about otc meds and HTN.    Did get flu shot this year.   Problem list reviewed, history of pulmonary edema with CHF, x-ray May 2019 with no acute cardiopulmonary findings.  CT angios chest in July 2019 with no sign of pulmonary embolus but did have cardiomegaly with a small pericardial effusion, patchy groundglass opacities in the lungs indicating atelectasis versus early edema.  Cardiologist: Dr. Ellyn Hack - had echo 3 days ago -EF 60-65%. No concerning findings.  Dyspnea for 2-3 years.  Patient Active Problem List   Diagnosis Date Noted  . Pulmonary edema with congestive heart failure with preserved left ventricular function ([place]) 03/23/2018  . DOE (dyspnea on exertion) 03/23/2018  . Obesity, morbid, BMI 40.0-49.9 ([place]) 03/23/2018  . Chronic diastolic heart failure ([place]) 03/23/2018  . Grade I diastolic dysfunction 84/13/2440  . Migraine without aura and without status migrainosus, not intractable 03/02/2017  . Class 3 severe obesity due to excess calories with serious comorbidity and body mass index (BMI) of 40.0 to 44.9 in adult ([place]) 01/13/2017  . Hiatal hernia 10/28/2016  . Pure hypercholesterolemia  10/28/2016  . Gastritis and gastroduodenitis 10/28/2016  . Chronic kidney disease 06/01/2015  . Esophageal reflux 06/01/2015  . Goiter 06/01/2015  . Essential hypertension 06/01/2015  . Chronic pain syndrome 05/28/2015  . Fibromyalgia 05/28/2015  . OSA (obstructive sleep apnea) 05/28/2015  . Adjustment disorder with mixed anxiety and depressed mood 05/28/2015   Past Medical History:  Diagnosis Date  . Allergy    [name], Flonase  . Anemia   . Anxiety   . Arthritis   . Chronic kidney disease   . Colon polyps   . Depression   . Fibromyalgia   . Gastritis   . GERD (gastroesophageal reflux disease)   . Hernia, hiatal   . HLD (hyperlipidemia)   . Hypertension   . Migraine   . Neuropathy   . Osteoporosis   . Pneumonia   . Sleep apnea with use of continuous positive airway pressure (CPAP)   . Thyroid goiter    Past Surgical History:  Procedure Laterality Date  . ABDOMINAL HYSTERECTOMY     ovaries intact; DUB.  No dysplasia.  Marland Kitchen JOINT REPLACEMENT    . OPEN REDUCTION INTERNAL FIXATION (ORIF) DISTAL RADIAL FRACTURE Right 07/09/2017   Procedure: RIGHT WRIST OPEN REDUCTION INTERNAL FIXATION (ORIF) DISTAL RADIAL FRACTURE AND REPAIR AS INDICATED;  Surgeon: Iran Planas, MD;  Location: [facility];  Service: Orthopedics;  Laterality: Right;  . PARTIAL HYSTERECTOMY    . ROTATOR CUFF REPAIR Right 2009  . SHOULDER SURGERY Right  shoulder dislocation, fall, and elbow unla nerve  . SHOULDER SURGERY Right 2015   labral tear  . TUBAL LIGATION    . ULNAR NERVE REPAIR Right 08/19/2014   Allergies  Allergen Reactions  . Sulfa Antibiotics Itching   Prior to Admission medications   Medication Sig Start Date End Date Taking? Authorizing Provider  Albuterol Sulfate (PROAIR RESPICLICK) 956 (90 Base) MCG/ACT AEPB Inhale 2 puffs into the lungs 4 (four) times daily as needed. 12/10/17   Rutherford Guys, MD  atorvastatin (LIPITOR) 10 MG tablet Take 1 tablet (10 mg total) by mouth daily at 6 PM.  10/28/17   Wardell Honour, MD  cyclobenzaprine (FLEXERIL) 10 MG tablet TAKE 1/2-1 TABLET BY MOUTH 3 TIMES DAILY AS NEEDED FOR MUSCLE SPASMS. 08/26/18   Rutherford Guys, MD  dexlansoprazole (DEXILANT) 60 MG capsule Take 1 capsule (60 mg total) by mouth daily. 06/14/18   Rutherford Guys, MD  docusate sodium (COLACE) 100 MG capsule TAKE 1 CAPSULE TWO TIMES DAILY 06/14/18   Rutherford Guys, MD  DULoxetine (CYMBALTA) 60 MG capsule Take 1 capsule (60 mg total) by mouth daily. 02/20/18   Wardell Honour, MD  famotidine (PEPCID) 20 MG tablet Take 1 tablet (20 mg total) by mouth at bedtime. 09/01/18   Rutherford Guys, MD  gabapentin (NEURONTIN) 300 MG capsule Take 1-2 capsules (300-600 mg total) by mouth 4 (four) times daily. 02/20/18   Wardell Honour, MD  lisinopril (PRINIVIL,ZESTRIL) 20 MG tablet Take 1 tablet (20 mg total) by mouth daily. 10/28/17   Wardell Honour, MD  ondansetron (ZOFRAN-ODT) 4 MG disintegrating tablet DISSOLVE 1 TABLET BY MOUTH EVERY 8 HOURS AS NEEDED FOR NAUSEA OR VOMITING. 08/26/18   Rutherford Guys, MD  oxybutynin (DITROPAN XL) 10 MG 24 hr tablet Take 1 tablet (10 mg total) by mouth at bedtime. 06/21/18   Rutherford Guys, MD  oxyCODONE-acetaminophen (PERCOCET/ROXICET) 5-325 MG tablet Take 1 tablet by mouth every 12 (twelve) hours as needed for severe pain. 06/14/18   Rutherford Guys, MD  propranolol (INDERAL) 60 MG tablet Take 1 tablet (60 mg total) by mouth 3 (three) times daily. 09/29/18   Lendon Colonel, NP  sertraline (ZOLOFT) 50 MG tablet Take 1 tablet (50 mg total) by mouth daily. 02/20/18   Wardell Honour, MD  topiramate (TOPAMAX) 50 MG tablet Take 1 tablet (50 mg total) by mouth 2 (two) times daily. 08/26/18   Rutherford Guys, MD   Social History   Socioeconomic History  . Marital status: Married    Spouse name: Not on file  . Number of children: 3  . Years of education: Not on file  . Highest education level: Not on file  Occupational History  . Occupation:  unemployed  Social Needs  . Financial resource strain: Not on file  . Food insecurity:    Worry: Not on file    Inability: Not on file  . Transportation needs:    Medical: Not on file    Non-medical: Not on file  Tobacco Use  . Smoking status: Former Smoker    Last attempt to quit: 08/04/1998    Years since quitting: 20.1  . Smokeless tobacco: Never Used  Substance and Sexual Activity  . Alcohol use: No  . Drug use: No  . Sexual activity: Yes    Birth control/protection: Surgical, Post-menopausal  Lifestyle  . Physical activity:    Days per week: Not on file    Minutes  per session: Not on file  . Stress: Not on file  Relationships  . Social connections:    Talks on phone: Not on file    Gets together: Not on file    Attends religious service: Not on file    Active member of club or organization: Not on file    Attends meetings of clubs or organizations: Not on file    Relationship status: Not on file  . Intimate partner violence:    Fear of current or ex partner: Not on file    Emotionally abused: Not on file    Physically abused: Not on file    Forced sexual activity: Not on file  Other Topics Concern  . Not on file  Social History Narrative   Marital status: married x 20 years; moderately happy     Children: 3 children (47 [name], 1 Dianna, 40 April); 8 grandchildren; 0 gg     Lives: with husband/Steve, April, 2 granddaughters     Employment:  Unemployed; quit working 2015 with work related injury; awaiting disability in 2018; disability approved in 02/2017.       Tobacco: quit smoking in 2016; smoked x 2 years.      Alcohol: none      Drugs: none      Exercise: rarely in 2018          Review of Systems Per HPI     Objective:   Physical Exam Vitals signs reviewed.  Constitutional:      General: She is not in acute distress.    Appearance: She is well-developed.  HENT:     Head: Normocephalic and atraumatic.     Right Ear: Hearing, tympanic membrane, ear  canal and external ear normal.     Left Ear: Hearing, tympanic membrane, ear canal and external ear normal.     Nose: Nose normal.     Mouth/Throat:     Pharynx: No oropharyngeal exudate.  Eyes:     Conjunctiva/sclera: Conjunctivae normal.     Pupils: Pupils are equal, round, and reactive to light.  Cardiovascular:     Rate and Rhythm: Normal rate and regular rhythm.     Heart sounds: Normal heart sounds. No murmur.  Pulmonary:     Effort: Pulmonary effort is normal. No respiratory distress.     Breath sounds: Normal breath sounds. No wheezing, rhonchi or rales.  Musculoskeletal:     Right lower leg: No edema.     Left lower leg: No edema.  Skin:    General: Skin is warm and dry.     Findings: No rash.  Neurological:     Mental Status: She is alert and oriented to person, place, and time.  Psychiatric:        Behavior: Behavior normal.    Vitals:   10/08/18 1155  BP: (!) 109/51  Pulse: 70  Resp: 16  Temp: 98.5 F (36.9 C)  TempSrc: Oral  SpO2: 98%  Weight: 258 lb (117 kg)  Height: 5\' 4"  (1.626 m)     Results for orders placed or performed in visit on 10/08/18  POCT CBC  Result Value Ref Range   WBC 5.3 4.6 - 10.2 K/uL   Lymph, poc 1.8 0.6 - 3.4   POC LYMPH PERCENT 33.9 10 - 50 %L   MID (cbc) 0.1 0 - 0.9   POC MID % 2.8 0 - 12 %M   POC Granulocyte 3.4 2 - 6.9   Granulocyte percent 63.3 37 -  80 %G   RBC 3.93 (A) 4.04 - 5.48 M/uL   Hemoglobin 9.1 (A) 11 - 14.6 g/dL   HCT, POC 29.4 29 - 41 %   MCV 74.8 (A) 76 - 111 fL   MCH, POC 23.1 (A) 27 - 31.2 pg   MCHC 30.9 (A) 31.8 - 35.4 g/dL   RDW, POC 16.9 %   Platelet Count, POC 301 142 - 424 K/uL   MPV 6.5 0 - 99.8 fL   No new abd pain, no blood in stools or dark tar colored stools.  Not on iron supplements recently.   Dg Chest 2 View  Result Date: 10/08/2018 CLINICAL DATA:  Cough, fatigue.  Ex-smoker. EXAM: CHEST - 2 VIEW COMPARISON:  Chest CTA dated 02/22/2018 and chest radiographs dated 12/10/2017. FINDINGS:  The cardiac silhouette remains near the upper limit of normal in size. Moderately large hiatal hernia with an increase in size since 12/10/2017. Small linear scar at the left lung base with slight improvement. Otherwise, clear lungs. Mild peribronchial thickening. Old, healed right 5th rib fracture. Thoracic and thoracolumbar spine degenerative changes. IMPRESSION: 1. Mild bronchitic changes with mild progression. 2. Moderately large hiatal hernia. Electronically Signed   By: Claudie Revering M.D.   On: 10/08/2018 13:16      Assessment & Plan:    [name] is a 63 y.o. female Cough - Plan: DG Chest 2 View, benzonatate (TESSALON) 100 MG capsule Acute upper respiratory infection History of CHF (congestive heart failure) - Plan: DG Chest 2 View  -Suspected viral syndrome.  Symptomatic care discussed, Tessalon Perles as needed.  Overall reassuring chest x-ray without signs of acute CHF.  RTC precautions given.  Fatigue, unspecified type - Plan: POCT CBC Anemia, unspecified type - Plan: Iron, TIBC and Ferritin Panel  -Slight decrease from previous baseline.  Check iron testing, restart iron supplement.  Planned follow-up with primary care provider in 3 days for repeat assessment.  ER precautions if worsening symptoms prior.  Meds ordered this encounter  Medications  . benzonatate (TESSALON) 100 MG capsule    Sig: Take 1 capsule (100 mg total) by mouth 2 (two) times daily as needed for cough.    Dispense:  20 capsule    Refill:  0   Patient Instructions   Tessalon Perles as needed for cough, see other information below on upper respiratory infections.  Current symptoms likely due to a virus.  Hemoglobin was slightly lower today than previous check many months ago.  I will check an iron test, but if you were previously instructed to take iron supplement, would restart iron supplement once per day.  Please follow-up in 3 days with Dr. Pamella Pert your primary care provider.  If any new  lightheadedness, worsening lightheadedness or dizziness over the weekend or any dark tarry colored stools or blood in the stools proceed to the emergency room.    Upper Respiratory Infection, Adult An upper respiratory infection (URI) affects the nose, throat, and upper air passages. URIs are caused by germs (viruses). The most common type of URI is often called "the common cold." Medicines cannot cure URIs, but you can do things at home to relieve your symptoms. URIs usually get better within 7-10 days. Follow these instructions at home: Activity  Rest as needed.  If you have a fever, stay home from work or school until your fever is gone, or until your doctor says you may return to work or school. ? You should stay home until you cannot  spread the infection anymore (you are not contagious). ? Your doctor may have you wear a face mask so you have less risk of spreading the infection. Relieving symptoms  Gargle with a salt-water mixture 3-4 times a day or as needed. To make a salt-water mixture, completely dissolve -1 tsp of salt in 1 cup of warm water.  Use a cool-mist humidifier to add moisture to the air. This can help you breathe more easily. Eating and drinking   Drink enough fluid to keep your pee (urine) pale yellow.  Eat soups and other clear broths. General instructions   Take over-the-counter and prescription medicines only as told by your doctor. These include cold medicines, fever reducers, and cough suppressants.  Do not use any products that contain nicotine or tobacco. These include cigarettes and e-cigarettes. If you need help quitting, ask your doctor.  Avoid being where people are smoking (avoid secondhand smoke).  Make sure you get regular shots and get the flu shot every year.  Keep all follow-up visits as told by your doctor. This is important. How to avoid spreading infection to others   Wash your hands often with soap and water. If you do not have soap  and water, use hand sanitizer.  Avoid touching your mouth, face, eyes, or nose.  Cough or sneeze into a tissue or your sleeve or elbow. Do not cough or sneeze into your hand or into the air. Contact a doctor if:  You are getting worse, not better.  You have any of these: ? A fever. ? Chills. ? Brown or red mucus in your nose. ? Yellow or brown fluid (discharge)coming from your nose. ? Pain in your face, especially when you bend forward. ? Swollen neck glands. ? Pain with swallowing. ? White areas in the back of your throat. Get help right away if:  You have shortness of breath that gets worse.  You have very bad or constant: ? Headache. ? Ear pain. ? Pain in your forehead, behind your eyes, and over your cheekbones (sinus pain). ? Chest pain.  You have long-lasting (chronic) lung disease along with any of these: ? Wheezing. ? Long-lasting cough. ? Coughing up blood. ? A change in your usual mucus.  You have a stiff neck.  You have changes in your: ? Vision. ? Hearing. ? Thinking. ? Mood. Summary  An upper respiratory infection (URI) is caused by a germ called a virus. The most common type of URI is often called "the common cold."  URIs usually get better within 7-10 days.  Take over-the-counter and prescription medicines only as told by your doctor. This information is not intended to replace advice given to you by your health care provider. Make sure you discuss any questions you have with your health care provider. Document Released: 01/07/2008 Document Revised: 03/13/2017 Document Reviewed: 03/13/2017 Elsevier Interactive Patient Education  2019 [employer] Inc.  Cough, Adult  Coughing is a reflex that clears your throat and your airways. Coughing helps to heal and protect your lungs. It is normal to cough occasionally, but a cough that happens with other symptoms or lasts a long time may be a sign of a condition that needs treatment. A cough may last only 2-3  weeks (acute), or it may last longer than 8 weeks (chronic). What are the causes? Coughing is commonly caused by:  Breathing in substances that irritate your lungs.  A viral or bacterial respiratory infection.  Allergies.  Asthma.  Postnasal drip.  Smoking.  Acid backing up from the stomach into the esophagus (gastroesophageal reflux).  Certain medicines.  Chronic lung problems, including COPD (or rarely, lung cancer).  Other medical conditions such as heart failure. Follow these instructions at home: Pay attention to any changes in your symptoms. Take these actions to help with your discomfort:  Take medicines only as told by your health care provider. ? If you were prescribed an antibiotic medicine, take it as told by your health care provider. Do not stop taking the antibiotic even if you start to feel better. ? Talk with your health care provider before you take a cough suppressant medicine.  Drink enough fluid to keep your urine clear or pale yellow.  If the air is dry, use a cold steam vaporizer or humidifier in your bedroom or your home to help loosen secretions.  Avoid anything that causes you to cough at work or at home.  If your cough is worse at night, try sleeping in a semi-upright position.  Avoid cigarette smoke. If you smoke, quit smoking. If you need help quitting, ask your health care provider.  Avoid caffeine.  Avoid alcohol.  Rest as needed. Contact a health care provider if:  You have new symptoms.  You cough up pus.  Your cough does not get better after 2-3 weeks, or your cough gets worse.  You cannot control your cough with suppressant medicines and you are losing sleep.  You develop pain that is getting worse or pain that is not controlled with pain medicines.  You have a fever.  You have unexplained weight loss.  You have night sweats. Get help right away if:  You cough up blood.  You have difficulty breathing.  Your  heartbeat is very fast. This information is not intended to replace advice given to you by your health care provider. Make sure you discuss any questions you have with your health care provider. Document Released: 01/17/2011 Document Revised: 12/27/2015 Document Reviewed: 09/27/2014 Elsevier Interactive Patient Education  [employer].   If you have lab work done today you will be contacted with your lab results within the next 2 weeks.  If you have not heard from [place] then please contact us. The fastest way to get your results is to register for My Chart.   IF you received an x-ray today, you will receive an invoice from [facility]. Please contact [facility] at [phone] with questions or concerns regarding your invoice.   IF you received labwork today, you will receive an invoice from [facility]. Please contact [facility] at [phone] with questions or concerns regarding your invoice.   Our billing staff will not be able to assist you with questions regarding bills from these companies.  You will be contacted with the lab results as soon as they are available. The fastest way to get your results is to activate your My Chart account. Instructions are located on the last page of this paperwork. If you have not heard from [place] regarding the results in 2 weeks, please contact this office.     \  Signed,   Merri Ray, MD [facility].  10/10/18 5:27 PM

## 2018-10-08 NOTE — Patient Instructions (Addendum)
Tessalon Perles as needed for cough, see other information below on upper respiratory infections.  Current symptoms likely due to a virus.  Hemoglobin was slightly lower today than previous check many months ago.  I will check an iron test, but if you were previously instructed to take iron supplement, would restart iron supplement once per day.  Please follow-up in 3 days with Dr. Pamella Pert your primary care provider.  If any new lightheadedness, worsening lightheadedness or dizziness over the weekend or any dark tarry colored stools or blood in the stools proceed to the emergency room.    Upper Respiratory Infection, Adult An upper respiratory infection (URI) affects the nose, throat, and upper air passages. URIs are caused by germs (viruses). The most common type of URI is often called "the common cold." Medicines cannot cure URIs, but you can do things at home to relieve your symptoms. URIs usually get better within 7-10 days. Follow these instructions at home: Activity  Rest as needed.  If you have a fever, stay home from work or school until your fever is gone, or until your doctor says you may return to work or school. ? You should stay home until you cannot spread the infection anymore (you are not contagious). ? Your doctor may have you wear a face mask so you have less risk of spreading the infection. Relieving symptoms  Gargle with a salt-water mixture 3-4 times a day or as needed. To make a salt-water mixture, completely dissolve -1 tsp of salt in 1 cup of warm water.  Use a cool-mist humidifier to add moisture to the air. This can help you breathe more easily. Eating and drinking   Drink enough fluid to keep your pee (urine) pale yellow.  Eat soups and other clear broths. General instructions   Take over-the-counter and prescription medicines only as told by your doctor. These include cold medicines, fever reducers, and cough suppressants.  Do not use any products that  contain nicotine or tobacco. These include cigarettes and e-cigarettes. If you need help quitting, ask your doctor.  Avoid being where people are smoking (avoid secondhand smoke).  Make sure you get regular shots and get the flu shot every year.  Keep all follow-up visits as told by your doctor. This is important. How to avoid spreading infection to others   Wash your hands often with soap and water. If you do not have soap and water, use hand sanitizer.  Avoid touching your mouth, face, eyes, or nose.  Cough or sneeze into a tissue or your sleeve or elbow. Do not cough or sneeze into your hand or into the air. Contact a doctor if:  You are getting worse, not better.  You have any of these: ? A fever. ? Chills. ? Brown or red mucus in your nose. ? Yellow or brown fluid (discharge)coming from your nose. ? Pain in your face, especially when you bend forward. ? Swollen neck glands. ? Pain with swallowing. ? White areas in the back of your throat. Get help right away if:  You have shortness of breath that gets worse.  You have very bad or constant: ? Headache. ? Ear pain. ? Pain in your forehead, behind your eyes, and over your cheekbones (sinus pain). ? Chest pain.  You have long-lasting (chronic) lung disease along with any of these: ? Wheezing. ? Long-lasting cough. ? Coughing up blood. ? A change in your usual mucus.  You have a stiff neck.  You have changes in  your: ? Vision. ? Hearing. ? Thinking. ? Mood. Summary  An upper respiratory infection (URI) is caused by a germ called a virus. The most common type of URI is often called "the common cold."  URIs usually get better within 7-10 days.  Take over-the-counter and prescription medicines only as told by your doctor. This information is not intended to replace advice given to you by your health care provider. Make sure you discuss any questions you have with your health care provider. Document Released:  01/07/2008 Document Revised: 03/13/2017 Document Reviewed: 03/13/2017 Elsevier Interactive Patient Education  2019 [employer]  Cough, Adult  Coughing is a reflex that clears your throat and your airways. Coughing helps to heal and protect your lungs. It is normal to cough occasionally, but a cough that happens with other symptoms or lasts a long time may be a sign of a condition that needs treatment. A cough may last only 2-3 weeks (acute), or it may last longer than 8 weeks (chronic). What are the causes? Coughing is commonly caused by:  Breathing in substances that irritate your lungs.  A viral or bacterial respiratory infection.  Allergies.  Asthma.  Postnasal drip.  Smoking.  Acid backing up from the stomach into the esophagus (gastroesophageal reflux).  Certain medicines.  Chronic lung problems, including COPD (or rarely, lung cancer).  Other medical conditions such as heart failure. Follow these instructions at home: Pay attention to any changes in your symptoms. Take these actions to help with your discomfort:  Take medicines only as told by your health care provider. ? If you were prescribed an antibiotic medicine, take it as told by your health care provider. Do not stop taking the antibiotic even if you start to feel better. ? Talk with your health care provider before you take a cough suppressant medicine.  Drink enough fluid to keep your urine clear or pale yellow.  If the air is dry, use a cold steam vaporizer or humidifier in your bedroom or your home to help loosen secretions.  Avoid anything that causes you to cough at work or at home.  If your cough is worse at night, try sleeping in a semi-upright position.  Avoid cigarette smoke. If you smoke, quit smoking. If you need help quitting, ask your health care provider.  Avoid caffeine.  Avoid alcohol.  Rest as needed. Contact a health care provider if:  You have new symptoms.  You cough up  pus.  Your cough does not get better after 2-3 weeks, or your cough gets worse.  You cannot control your cough with suppressant medicines and you are losing sleep.  You develop pain that is getting worse or pain that is not controlled with pain medicines.  You have a fever.  You have unexplained weight loss.  You have night sweats. Get help right away if:  You cough up blood.  You have difficulty breathing.  Your heartbeat is very fast. This information is not intended to replace advice given to you by your health care provider. Make sure you discuss any questions you have with your health care provider. Document Released: 01/17/2011 Document Revised: 12/27/2015 Document Reviewed: 09/27/2014 Elsevier Interactive Patient Education  [employer].   If you have lab work done today you will be contacted with your lab results within the next 2 weeks.  If you have not heard from [place] then please contact us. The fastest way to get your results is to register for My Chart.  IF you received an x-ray today, you will receive an invoice from [facility]. Please contact [facility] at [phone] with questions or concerns regarding your invoice.   IF you received labwork today, you will receive an invoice from [facility]. Please contact [facility] at [phone] with questions or concerns regarding your invoice.   Our billing staff will not be able to assist you with questions regarding bills from these companies.  You will be contacted with the lab results as soon as they are available. The fastest way to get your results is to activate your My Chart account. Instructions are located on the last page of this paperwork. If you have not heard from [place] regarding the results in 2 weeks, please contact this office.     \

## 2018-10-09 LAB — IRON,TIBC AND FERRITIN PANEL
Ferritin: 6 ng/mL — ABNORMAL LOW (ref 15–150)
IRON SATURATION: 4 % — AB (ref 15–55)
Iron: 15 ug/dL — ABNORMAL LOW (ref 27–139)
Total Iron Binding Capacity: 415 ug/dL (ref 250–450)
UIBC: 400 ug/dL — ABNORMAL HIGH (ref 118–369)

## 2018-10-10 ENCOUNTER — Encounter: Payer: Self-pay | Admitting: Family Medicine

## 2018-10-12 ENCOUNTER — Telehealth: Payer: Self-pay | Admitting: Oncology

## 2018-10-12 ENCOUNTER — Encounter: Payer: Self-pay | Admitting: Family Medicine

## 2018-10-12 ENCOUNTER — Other Ambulatory Visit: Payer: Self-pay

## 2018-10-12 ENCOUNTER — Ambulatory Visit (INDEPENDENT_AMBULATORY_CARE_PROVIDER_SITE_OTHER): Payer: 59 | Admitting: Family Medicine

## 2018-10-12 ENCOUNTER — Telehealth: Payer: Self-pay | Admitting: Family Medicine

## 2018-10-12 VITALS — BP 114/71 | HR 61 | Temp 98.7°F | Ht 64.0 in | Wt 259.6 lb

## 2018-10-12 DIAGNOSIS — D509 Iron deficiency anemia, unspecified: Secondary | ICD-10-CM | POA: Diagnosis not present

## 2018-10-12 DIAGNOSIS — K449 Diaphragmatic hernia without obstruction or gangrene: Secondary | ICD-10-CM | POA: Diagnosis not present

## 2018-10-12 DIAGNOSIS — K219 Gastro-esophageal reflux disease without esophagitis: Secondary | ICD-10-CM

## 2018-10-12 MED ORDER — FERROUS SULFATE 325 (65 FE) MG PO TBEC: 325.0000 mg | DELAYED_RELEASE_TABLET | Freq: Two times a day (BID) | ORAL | 3 refills | Status: DC

## 2018-10-12 NOTE — Patient Instructions (Addendum)
If you have lab work done today you will be contacted with your lab results within the next 2 weeks.  If you have not heard from [place] then please contact us. The fastest way to get your results is to register for My Chart.   IF you received an x-ray today, you will receive an invoice from [facility]. Please contact [facility] at [phone] with questions or concerns regarding your invoice.   IF you received labwork today, you will receive an invoice from [facility]. Please contact [facility] at [phone] with questions or concerns regarding your invoice.   Our billing staff will not be able to assist you with questions regarding bills from these companies.  You will be contacted with the lab results as soon as they are available. The fastest way to get your results is to activate your My Chart account. Instructions are located on the last page of this paperwork. If you have not heard from [place] regarding the results in 2 weeks, please contact this office.     Iron Deficiency Anemia, Adult Iron deficiency anemia is a condition in which the concentration of red blood cells or hemoglobin in the blood is below normal because of too little iron. Hemoglobin is a substance in red blood cells that carries oxygen to the body's tissues. When the concentration of red blood cells or hemoglobin is too low, not enough oxygen reaches these tissues. Iron deficiency anemia is usually long-lasting (chronic) and it develops over time. It may or may not cause symptoms. It is a common type of anemia. What are the causes? This condition may be caused by:  Not enough iron in the diet.  Blood loss caused by bleeding in the intestine.  Blood loss from a gastrointestinal condition like Crohn disease.  Frequent blood draws, such as from blood donation.  Abnormal absorption in the gut.  Heavy menstrual periods in women.  Cancers of the gastrointestinal system, such as colon cancer. What  are the signs or symptoms? Symptoms of this condition may include:  Fatigue.  Headache.  Pale skin, lips, and nail beds.  Poor appetite.  Weakness.  Shortness of breath.  Dizziness.  Cold hands and feet.  Fast or irregular heartbeat.  Irritability. This is more common in severe anemia.  Rapid breathing. This is more common in severe anemia. Mild anemia may not cause any symptoms. How is this diagnosed? This condition is diagnosed based on:  Your medical history.  A physical exam.  Blood tests. You may have additional tests to find the underlying cause of your anemia, such as:  Testing for blood in the stool (fecal occult blood test).  A procedure to see inside your colon and rectum (colonoscopy).  A procedure to see inside your esophagus and stomach (endoscopy).  A test in which cells are removed from bone marrow (bone marrow aspiration) or fluid is removed from the bone marrow to be examined (biopsy). This is rarely needed. How is this treated? This condition is treated by correcting the cause of your iron deficiency. Treatment may involve:  Adding iron-rich foods to your diet.  Taking iron supplements. If you are pregnant or breastfeeding, you may need to take extra iron because your normal diet usually does not provide the amount of iron that you need.  Increasing vitamin C intake. Vitamin C helps your body absorb iron. Your health care provider may recommend that you take iron supplements along with a glass of orange juice or a vitamin  C supplement.  Medicines to make heavy menstrual flow lighter.  Surgery. You may need repeat blood tests to determine whether treatment is working. Depending on the underlying cause, the anemia should be corrected within 2 months of starting treatment. If the treatment does not seem to be working, you may need more testing. Follow these instructions at home: Medicines  Take over-the-counter and prescription medicines only  as told by your health care provider. This includes iron supplements and vitamins.  If you cannot tolerate taking iron supplements by mouth, talk with your health care provider about taking them through a vein (intravenously) or an injection into a muscle.  For the best iron absorption, you should take iron supplements when your stomach is empty. If you cannot tolerate them on an empty stomach, you may need to take them with food.  Do not drink milk or take antacids at the same time as your iron supplements. Milk and antacids may interfere with iron absorption.  Iron supplements can cause constipation. To prevent constipation, include fiber in your diet as told by your health care provider. A stool softener may also be recommended. Eating and drinking   Talk with your health care provider before changing your diet. He or she may recommend that you eat foods that contain a lot of iron, such as: ? Liver. ? Low-fat (lean) beef. ? Breads and cereals that have iron added to them (are fortified). ? Eggs. ? Dried fruit. ? Dark green, leafy vegetables.  To help your body use the iron from iron-rich foods, eat those foods at the same time as fresh fruits and vegetables that are high in vitamin C. Foods that are high in vitamin C include: ? Oranges. ? Peppers. ? Tomatoes. ? Mangoes.  Drinkenoughfluid to keep your urine clear or pale yellow. General instructions  Return to your normal activities as told by your health care provider. Ask your health care provider what activities are safe for you.  Practice good hygiene. Anemia can make you more prone to illness and infection.  Keep all follow-up visits as told by your health care provider. This is important. Contact a health care provider if:  You feel nauseous or you vomit.  You feel weak.  You have unexplained sweating.  You develop symptoms of constipation, such as: ? Having fewer than three bowel movements a week. ? Straining to  have a bowel movement. ? Having stools that are hard, dry, or larger than normal. ? Feeling full or bloated. ? Pain in the lower abdomen. ? Not feeling relief after having a bowel movement. Get help right away if:  You faint. If this happens, do not drive yourself to the hospital. Call your local emergency services (911 in the [place]  You have chest pain.  You have shortness of breath that: ? Is severe. ? Gets worse with physical activity.  You have a rapid heartbeat.  You become light-headed when getting up from a sitting or lying down position. This information is not intended to replace advice given to you by your health care provider. Make sure you discuss any questions you have with your health care provider. Document Released: 07/18/2000 Document Revised: 04/09/2016 Document Reviewed: 04/09/2016 Elsevier Interactive Patient Education  2019 [employer].

## 2018-10-12 NOTE — Telephone Encounter (Signed)
Spoke with pt advised appt [place] at [facility] for 10/13/2018 at 1:45 pm.  Pt agreeable and phone number given to pt if appt needs to be rescheduled or if she has questions [phone]. Dgaddy, CMA

## 2018-10-12 NOTE — Progress Notes (Signed)
[name]  [phone] [phone]    ID: DEAUNNA OLARTE DOB: 03-14-1956  MR#: [id number]  VEH#:[id number]  Patient Care Team: Rutherford Guys, MD as PCP - General (Family Medicine) Leonie Man, MD as PCP - Cardiology (Cardiology) Simisola Sandles, Virgie Dad, MD as Consulting Physician (Hematology and Oncology) Garner Nash, DO as Consulting Physician (Pulmonary Disease) Milus Banister, MD as Attending Physician (Gastroenterology) OTHER MD: Icard   CHIEF COMPLAINT: Iron deficiency anemia  CURRENT TREATMENT: Iron supplementation  HISTORY OF CURRENT ILLNESS: [name] is referred here for iron deficiency anemia.   Results for Melissa, [name] (MRN [id number]) as of 10/12/2018 17:19  Ref. Range 10/08/2018 16:45  Iron Latest Ref Range: 27 - 139 ug/dL 15 (L)  UIBC Latest Ref Range: 118 - 369 ug/dL 400 (H)  TIBC Latest Ref Range: 250 - 450 ug/dL 415  Ferritin Latest Ref Range: 15 - 150 ng/mL 6 (L)  Iron Saturation Latest Ref Range: 15 - 55 % 4 (LL)    The patient's subsequent history is as detailed below.   INTERVAL HISTORY: [name] was evaluated in the hematology clinic on 10/13/2018 accompanied by her husband.    REVIEW OF SYSTEMS: [name] notes that she does get weak, dizzy, and fatigued. Her bowel movements are dark brown; she does have constipation, with near-black stool. She does use stool softeners. She has to wear pads because she experiencing bowel leakage. She occasionally experiencing some bleeding when constipated, with fresh blood. She does not have blood in her urine to her knowledge. She has prior history of a peptic ulcer. She does not have a formal exercise routine. She does some housework, but she gets short of breath easily and feels some pressure in her chest with exertion. Her husband notes that her blood pressure has been steadily decreasing.   The patient denies unusual headaches, visual changes, nausea, vomiting, stiff neck,  or gait imbalance. There has been no phlegm production, or pleurisy, no chest pain, and no change in bladder habits. The patient denies fever, rash, or unexplained weight loss. A detailed review of systems was otherwise entirely negative.   PAST MEDICAL HISTORY: Past Medical History:  Diagnosis Date  . Allergy    [name], Flonase  . Anemia   . Anxiety   . Arthritis   . Chronic kidney disease   . Colon polyps   . Depression   . Fibromyalgia   . Gastritis   . GERD (gastroesophageal reflux disease)   . Hernia, hiatal   . HLD (hyperlipidemia)   . Hypertension   . Migraine   . Neuropathy   . Osteoporosis   . Pneumonia   . Sleep apnea with use of continuous positive airway pressure (CPAP)   . Thyroid goiter    Congestive Heart Failure; Englarged goiter   PAST SURGICAL HISTORY: Past Surgical History:  Procedure Laterality Date  . ABDOMINAL HYSTERECTOMY     ovaries intact; DUB.  No dysplasia.  Marland Kitchen JOINT REPLACEMENT    . OPEN REDUCTION INTERNAL FIXATION (ORIF) DISTAL RADIAL FRACTURE Right 07/09/2017   Procedure: RIGHT WRIST OPEN REDUCTION INTERNAL FIXATION (ORIF) DISTAL RADIAL FRACTURE AND REPAIR AS INDICATED;  Surgeon: Iran Planas, MD;  Location: [facility];  Service: Orthopedics;  Laterality: Right;  . PARTIAL HYSTERECTOMY    . ROTATOR CUFF REPAIR Right 2009  . SHOULDER SURGERY Right    shoulder dislocation, fall, and elbow unla nerve  . SHOULDER SURGERY Right 2015   labral tear  .  TUBAL LIGATION    . ULNAR NERVE REPAIR Right 08/19/2014   Hammertoe surgery   FAMILY HISTORY: Family History  Problem Relation Age of Onset  . Heart disease Mother 63       AMI/CAD/CHF as cause of death  . Hypertension Mother   . Hyperlipidemia Mother   . Hypothyroidism Mother   . Depression Mother   . Gout Mother   . Heart disease Father 75       AMI  . Hypertension Father   . Stroke Father 9       mild CVA  . Prostate cancer Father   . Hyperlipidemia Father   . Cancer Father 81        prostate cancer  . Hypertension Brother   . Hyperlipidemia Brother   . Cancer Brother        prostate cancer  . Cancer Maternal Grandmother        type unknown  . Cancer Maternal Grandfather        type unknown  . Heart disease Paternal Grandmother   . Heart defect Paternal Grandfather   . Melanoma Brother   . Hyperlipidemia Brother   . Hypertension Brother   . Cancer Brother        melanoma  . Hypertension Brother   . Hyperlipidemia Brother   . Melanoma Brother   . Cancer Brother 25       melanoma  . Cancer Sister        Basal cell carcinoma scalp  . Depression Sister   . Hypertension Brother   . Depression Brother   . Hypertension Brother   . Gout Brother   . Arthritis Sister   . Depression Sister    Anquinette's father died from heart complications at age 59. Patients' mother died from a myocardial infarction at age 58. The patient has 5 blood brothers, one adoptive brother, and 2 sisters. Patient denies anyone in her family having breast, ovarian, or pancreatic cancer. One of her brothers was diagnosed with prostate cancer and crones disease. Renay has had 3 siblings with melanoma diagnosis.    GYNECOLOGIC HISTORY:  No LMP recorded. Patient has had a hysterectomy. 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 Her husband, [name], is a respiratory therapist with [employer]. [name] has 3 children, but her first child was adopted out. Her daughter [name] is 34, lives in [place], and works at [employer]. [name] is 61, lives in [place], and also works at [employer] has 8 granddaughters and has 1 grandson on the way. She is [employer].    ADVANCED DIRECTIVES: Her husband is automatically her healthcare power of attorney.     HEALTH MAINTENANCE: Social History   Tobacco Use  . Smoking status: Former  Smoker    Last attempt to quit: 08/04/1998    Years since quitting: 20.2  . Smokeless tobacco: Never Used  Substance Use Topics  . Alcohol use: No  . Drug use: No    Colonoscopy: 2012  PAP:   Bone density:   Mammography: up to date  Allergies  Allergen Reactions  . Sulfa Antibiotics Itching    Current Outpatient Medications  Medication Sig Dispense Refill  . Albuterol Sulfate (PROAIR RESPICLICK) 253 (90 Base) MCG/ACT AEPB Inhale 2 puffs into the lungs 4 (four) times  daily as needed. 1 each 1  . atorvastatin (LIPITOR) 10 MG tablet Take 1 tablet (10 mg total) by mouth daily at 6 PM. 90 tablet 3  . benzonatate (TESSALON) 100 MG capsule Take 1 capsule (100 mg total) by mouth 2 (two) times daily as needed for cough. 20 capsule 0  . cyclobenzaprine (FLEXERIL) 10 MG tablet TAKE 1/2-1 TABLET BY MOUTH 3 TIMES DAILY AS NEEDED FOR MUSCLE SPASMS. 180 tablet 1  . dexlansoprazole (DEXILANT) 60 MG capsule Take 1 capsule (60 mg total) by mouth daily. 90 capsule 3  . docusate sodium (COLACE) 100 MG capsule TAKE 1 CAPSULE TWO TIMES DAILY 30 capsule 2  . DULoxetine (CYMBALTA) 60 MG capsule Take 1 capsule (60 mg total) by mouth daily. 90 capsule 1  . famotidine (PEPCID) 20 MG tablet Take 1 tablet (20 mg total) by mouth at bedtime. 90 tablet 1  . gabapentin (NEURONTIN) 300 MG capsule Take 1-2 capsules (300-600 mg total) by mouth 4 (four) times daily. 540 capsule 1  . lisinopril (PRINIVIL,ZESTRIL) 20 MG tablet Take 1 tablet (20 mg total) by mouth daily. 90 tablet 3  . ondansetron (ZOFRAN-ODT) 4 MG disintegrating tablet DISSOLVE 1 TABLET BY MOUTH EVERY 8 HOURS AS NEEDED FOR NAUSEA OR VOMITING. 20 tablet 4  . oxybutynin (DITROPAN XL) 10 MG 24 hr tablet Take 1 tablet (10 mg total) by mouth at bedtime. 90 tablet 1  . oxyCODONE-acetaminophen (PERCOCET/ROXICET) 5-325 MG tablet Take 1 tablet by mouth every 12 (twelve) hours as needed for severe pain. 30 tablet 0  . propranolol (INDERAL) 60 MG tablet Take 1 tablet  (60 mg total) by mouth 3 (three) times daily. 270 tablet 2  . sertraline (ZOLOFT) 50 MG tablet Take 1 tablet (50 mg total) by mouth daily. 90 tablet 1  . topiramate (TOPAMAX) 50 MG tablet Take 1 tablet (50 mg total) by mouth 2 (two) times daily. 180 tablet 1  . ferrous sulfate 325 (65 FE) MG EC tablet Take 1 tablet (325 mg total) by mouth 2 (two) times daily after a meal. (Patient not taking: Reported on 10/13/2018) 60 tablet 3   No current facility-administered medications for this visit.      OBJECTIVE: Morbidly obese white woman in no acute distress  Vitals:   10/13/18 1425  BP: (!) 106/52  Pulse: 88  Resp: 18  Temp: 98.8 F (37.1 C)  SpO2: 100%     Body mass index is 45.08 kg/m.   Wt Readings from Last 3 Encounters:  10/13/18 262 lb 9.6 oz (119.1 kg)  10/12/18 259 lb 9.6 oz (117.8 kg)  10/08/18 258 lb (117 kg)      ECOG FS:2 - Symptomatic, <50% confined to bed  Ocular: Sclerae unicteric, pupils round and equal Ear-nose-throat: Oropharynx clear and moist Lymphatic: No cervical or supraclavicular adenopathy Lungs no rales or rhonchi Heart regular rate and rhythm Abd soft, nontender, positive bowel sounds MSK no focal spinal tenderness, no joint edema Neuro: non-focal, well-oriented, appropriate affect Breasts: Deferred   LAB RESULTS:  CMP     Component Value Date/Time   NA 141 09/17/2018 1224   K 4.4 09/17/2018 1224   CL 107 (H) 09/17/2018 1224   CO2 21 09/17/2018 1224   GLUCOSE 94 09/17/2018 1224   GLUCOSE 107 (H) 03/23/2018 1637   BUN 12 09/17/2018 1224   CREATININE 0.97 09/17/2018 1224   CREATININE 1.01 (H) 05/21/2016 1219   CALCIUM 9.0 09/17/2018 1224   PROT 6.9 09/17/2018 1224   ALBUMIN 4.3  09/17/2018 1224   AST 18 09/17/2018 1224   ALT 11 09/17/2018 1224   ALKPHOS 85 09/17/2018 1224   BILITOT <0.2 09/17/2018 1224   GFRNONAA 63 09/17/2018 [place] 04/22/2016 1743   GFRAA 72 09/17/2018 1224   GFRAA 70 04/22/2016 1743    No results  found for: TOTALPROTELP, ALBUMINELP, A1GS, A2GS, BETS, BETA2SER, GAMS, MSPIKE, SPEI  No results found for: KPAFRELGTCHN, LAMBDASER, KAPLAMBRATIO  Lab Results  Component Value Date   WBC 5.3 10/08/2018   NEUTROABS 3.0 02/19/2018   HGB 9.1 (A) 10/08/2018   HCT 29.4 10/08/2018   MCV 74.8 (A) 10/08/2018   PLT 256 02/19/2018    @LASTCHEMISTRY @  No results found for: LABCA2  No components found for: [id number]  No results for input(s): INR in the last 168 hours.  No results found for: LABCA2  No results found for: QMG867  No results found for: YPP509  No results found for: TOI712  No results found for: CA2729  No components found for: HGQUANT  No results found for: CEA1 / No results found for: CEA1   No results found for: AFPTUMOR  No results found for: CHROMOGRNA  No results found for: PSA1  No visits with results within 3 Day(s) from this visit.  Latest known visit with results is:  Office Visit on 10/08/2018  Component Date Value Ref Range Status  . WBC 10/08/2018 5.3  4.6 - 10.2 K/uL Final  . Lymph, poc 10/08/2018 1.8  0.6 - 3.4 Final  . POC LYMPH PERCENT 10/08/2018 33.9  10 - 50 %L Final  . MID (cbc) 10/08/2018 0.1  0 - 0.9 Final  . POC MID % 10/08/2018 2.8  0 - 12 %M Final  . POC Granulocyte 10/08/2018 3.4  2 - 6.9 Final  . Granulocyte percent 10/08/2018 63.3  37 - 80 %G Final  . RBC 10/08/2018 3.93* 4.04 - 5.48 M/uL Final  . Hemoglobin 10/08/2018 9.1* 11 - 14.6 g/dL Final  . HCT, POC 10/08/2018 29.4  29 - 41 % Final  . MCV 10/08/2018 74.8* 76 - 111 fL Final  . MCH, POC 10/08/2018 23.1* 27 - 31.2 pg Final  . MCHC 10/08/2018 30.9* 31.8 - 35.4 g/dL Final  . RDW, POC 10/08/2018 16.9  % Final  . Platelet Count, POC 10/08/2018 301  142 - 424 K/uL Final  . MPV 10/08/2018 6.5  0 - 99.8 fL Final  . Total Iron Binding Capacity 10/08/2018 415  250 - 450 ug/dL Final  . UIBC 10/08/2018 400* 118 - 369 ug/dL Final  . Iron 10/08/2018 15* 27 - 139 ug/dL Final  . Iron  Saturation 10/08/2018 4* 15 - 55 % Final  . Ferritin 10/08/2018 6* 15 - 150 ng/mL Final    (this displays the last labs from the last 3 days)  No results found for: TOTALPROTELP, ALBUMINELP, A1GS, A2GS, BETS, BETA2SER, GAMS, MSPIKE, SPEI (this displays SPEP labs)  No results found for: KPAFRELGTCHN, LAMBDASER, KAPLAMBRATIO (kappa/lambda light chains)  No results found for: HGBA, HGBA2QUANT, HGBFQUANT, HGBSQUAN (Hemoglobinopathy evaluation)   No results found for: LDH  Lab Results  Component Value Date   IRON 15 (L) 10/08/2018   TIBC 415 10/08/2018   IRONPCTSAT 4 (LL) 10/08/2018   (Iron and TIBC)  Lab Results  Component Value Date   FERRITIN 6 (L) 10/08/2018    Urinalysis    Component Value Date/Time   COLORURINE YELLOW 03/09/2008 [place] 03/09/2008 2224   LABSPEC 1.014  03/09/2008 2224   PHURINE 5.0 03/09/2008 2224   GLUCOSEU NEGATIVE 03/09/2008 2224   HGBUR NEGATIVE 03/09/2008 2224   BILIRUBINUR negative 02/19/2018 1104   KETONESUR negative 02/19/2018 [place] 03/09/2008 2224   PROTEINUR trace (A) 02/19/2018 1104   PROTEINUR NEGATIVE 03/09/2008 2224   UROBILINOGEN 0.2 02/19/2018 1104   UROBILINOGEN 0.2 03/09/2008 2224   NITRITE Negative 02/19/2018 1104   NITRITE NEGATIVE 03/09/2008 2224   LEUKOCYTESUR Small (1+) (A) 02/19/2018 1104     STUDIES:  Dg Chest 2 View  Result Date: 10/08/2018 CLINICAL DATA:  Cough, fatigue.  Ex-smoker. EXAM: CHEST - 2 VIEW COMPARISON:  Chest CTA dated 02/22/2018 and chest radiographs dated 12/10/2017. FINDINGS: The cardiac silhouette remains near the upper limit of normal in size. Moderately large hiatal hernia with an increase in size since 12/10/2017. Small linear scar at the left lung base with slight improvement. Otherwise, clear lungs. Mild peribronchial thickening. Old, healed right 5th rib fracture. Thoracic and thoracolumbar spine degenerative changes. IMPRESSION: 1. Mild bronchitic changes with  mild progression. 2. Moderately large hiatal hernia. Electronically Signed   By: Claudie Revering M.D.   On: 10/08/2018 13:16     ELIGIBLE FOR AVAILABLE RESEARCH PROTOCOL: no   ASSESSMENT: 63 y.o. [place], [place] woman with a remote history of peptic ulcer disease and a benign colonoscopy 2012, now iron deficient without an obvious bleeding source   PLAN: I spent approximately 50 minutes face to face with Mariyam with more than 50% of that time spent in counseling and coordination of care. Specifically we reviewed the biology of the patient's diagnosis and the specifics of her situation.  We discussed the fact that the body holds onto iron tightly and there is no normal iron excretion.  When patients become iron deficient it is because they have lost more iron than they have ingested in this occurs because of bleeding.  She does have a history of peptic ulcer disease and currently has some symptoms of gastritis.  She has not had melenic stools, hematochezia or epistaxis, or any bruising that she is aware of.  She is already scheduled to see GI, Dr. Ardis Hughs, in about 2 weeks for evaluation of gastritis  We discussed different ways of iron supplementation.  She apparently has tolerated oral iron well in the past.  She does have a prescription for ferrous sulfate but she has not started it.  My suggestion was that we hold the oral iron for now, until her GI work-up is done.  In the meantime she has the option of receiving Feraheme and we discussed that in detail today.  She has a good understanding of the possible toxicities side effects and complications of that agent.  She is very interested in receiving it because she tells me she is feeling a little faint at times (she is also on blood pressure medications) and hopes she will be able to do a little walking exercise if she has the anemia under better control.  Accordingly she will return here on 10/21/2018 on 10/28/2018 for Feraheme infusions.  She will  then have her GI work-up under Dr. Ardis Hughs later this month.  She will return to see me in 6 to 8 weeks just to recheck labs and assuming her anemia has resolved and she is tolerating oral iron well she likely will not need any further visits here  She has a good understanding of this plan.  She agrees with it.  She knows to call for any other  issues that may develop before the next visit.  Emmelyn Schmale, Virgie Dad, MD  10/13/18 2:47 PM Medical Oncology and Hematology [facility] [address] [place], [place] [address] Tel. [phone]    Fax. [phone]   I, Jacqualyn Posey am acting as a Education administrator for Chauncey Cruel, MD.   I, Lurline Del MD, have reviewed the above documentation for accuracy and completeness, and I agree with the above.

## 2018-10-12 NOTE — Progress Notes (Signed)
3/10/202012:53 PM  [name] [birth year], 63 y.o. female [id number]  Chief Complaint  Patient presents with  . Follow-up    follow up on iron results    HPI:   Patient is a 63 y.o. female with past medical history significant for HTN, HLP, dCHF, OSA on cpap, morbid obesity, GERD, pre-diabetes who presents today for followup on recent diagnosis of anemia  Diagnosed in July 2019 - anemia by Dr Tamala Julian Prescribed iron tablets Patient has not been taking them Patient reports colonoscopy at [facility] with Dr Ferdinand Lango, at age 89, told to back in 10 years Always feels nauseous, but does not vomit Has epigastric abd pain, this has been worsening, would like to see GI again for her [place] Denies any black tarry stools If she gets constipated she might see a bit a bright red blood Does not meat often Denies any vaginal bleeding  Reports she has been feeling dizzy/lightheaded if she stands up to quickly specially after she bends over Reports occ chest pressure into her left arm, occ SOB Denies any palpations Had normal echo march 2020, a week ago Sees cards next week  CBC Latest Ref Rng & Units 10/08/2018 02/19/2018 12/10/2017  WBC 4.6 - 10.2 K/uL 5.3 5.5 4.2(A)  Hemoglobin 11 - 14.6 g/dL 9.1(A) 10.5(L) 11.1(A)  Hematocrit 29 - 41 % 29.4 33.7(L) 35.0(A)  Platelets 150 - 450 x10E3/uL - 256 -   Lab Results  Component Value Date   IRON 15 (L) 10/08/2018   TIBC 415 10/08/2018   FERRITIN 6 (L) 10/08/2018   EGD jan 2018 - large HH with inflammation in atrum  Fall Risk  10/12/2018 10/08/2018 09/17/2018 06/14/2018 02/19/2018  Falls in the past year? 0 0 0 0 No  Number falls in past yr: 0 0 - - -  Injury with Fall? 0 0 0 - -  Comment - - - - -     Depression screen [facility] 2/9 10/12/2018 10/08/2018 09/17/2018  Decreased Interest 0 0 3  Down, Depressed, Hopeless 0 0 3  PHQ - 2 Score 0 0 6  Altered sleeping - - 3  Tired, decreased energy - - 1  Change in appetite - - 0  Feeling bad or  failure about yourself  - - 1  Trouble concentrating - - 0  Moving slowly or fidgety/restless - - 0  Suicidal thoughts - - 0  PHQ-9 Score - - 11  Difficult doing work/chores - - -  Some recent data might be hidden    Allergies  Allergen Reactions  . Sulfa Antibiotics Itching    Prior to Admission medications   Medication Sig Start Date End Date Taking? Authorizing Provider  Albuterol Sulfate (PROAIR RESPICLICK) 735 (90 Base) MCG/ACT AEPB Inhale 2 puffs into the lungs 4 (four) times daily as needed. 12/10/17  Yes Rutherford Guys, MD  atorvastatin (LIPITOR) 10 MG tablet Take 1 tablet (10 mg total) by mouth daily at 6 PM. 10/28/17  Yes Wardell Honour, MD  benzonatate (TESSALON) 100 MG capsule Take 1 capsule (100 mg total) by mouth 2 (two) times daily as needed for cough. 10/08/18  Yes Wendie Agreste, MD  cyclobenzaprine (FLEXERIL) 10 MG tablet TAKE 1/2-1 TABLET BY MOUTH 3 TIMES DAILY AS NEEDED FOR MUSCLE SPASMS. 08/26/18  Yes Rutherford Guys, MD  dexlansoprazole (DEXILANT) 60 MG capsule Take 1 capsule (60 mg total) by mouth daily. 06/14/18  Yes Rutherford Guys, MD  docusate sodium (COLACE) 100 MG capsule  TAKE 1 CAPSULE TWO TIMES DAILY 06/14/18  Yes Rutherford Guys, MD  DULoxetine (CYMBALTA) 60 MG capsule Take 1 capsule (60 mg total) by mouth daily. 02/20/18  Yes Wardell Honour, MD  famotidine (PEPCID) 20 MG tablet Take 1 tablet (20 mg total) by mouth at bedtime. 09/01/18  Yes Rutherford Guys, MD  gabapentin (NEURONTIN) 300 MG capsule Take 1-2 capsules (300-600 mg total) by mouth 4 (four) times daily. 02/20/18  Yes [place]ell Honour, MD  lisinopril (PRINIVIL,ZESTRIL) 20 MG tablet Take 1 tablet (20 mg total) by mouth daily. 10/28/17  Yes [place]ell Honour, MD  ondansetron (ZOFRAN-ODT) 4 MG disintegrating tablet DISSOLVE 1 TABLET BY MOUTH EVERY 8 HOURS AS NEEDED FOR NAUSEA OR VOMITING. 08/26/18  Yes Rutherford Guys, MD  oxybutynin (DITROPAN XL) 10 MG 24 hr tablet Take 1 tablet (10 mg total) by mouth  at bedtime. 06/21/18  Yes Rutherford Guys, MD  oxyCODONE-acetaminophen (PERCOCET/ROXICET) 5-325 MG tablet Take 1 tablet by mouth every 12 (twelve) hours as needed for severe pain. 06/14/18  Yes Rutherford Guys, MD  propranolol (INDERAL) 60 MG tablet Take 1 tablet (60 mg total) by mouth 3 (three) times daily. 09/29/18  Yes Lendon Colonel, NP  sertraline (ZOLOFT) 50 MG tablet Take 1 tablet (50 mg total) by mouth daily. 02/20/18  Yes [place]ell Honour, MD  topiramate (TOPAMAX) 50 MG tablet Take 1 tablet (50 mg total) by mouth 2 (two) times daily. 08/26/18  Yes Rutherford Guys, MD    Past Medical History:  Diagnosis Date  . Allergy    [name], Flonase  . Anemia   . Anxiety   . Arthritis   . Chronic kidney disease   . Colon polyps   . Depression   . Fibromyalgia   . Gastritis   . GERD (gastroesophageal reflux disease)   . Hernia, hiatal   . HLD (hyperlipidemia)   . Hypertension   . Migraine   . Neuropathy   . Osteoporosis   . Pneumonia   . Sleep apnea with use of continuous positive airway pressure (CPAP)   . Thyroid goiter     Past Surgical History:  Procedure Laterality Date  . ABDOMINAL HYSTERECTOMY     ovaries intact; DUB.  No dysplasia.  Marland Kitchen JOINT REPLACEMENT    . OPEN REDUCTION INTERNAL FIXATION (ORIF) DISTAL RADIAL FRACTURE Right 07/09/2017   Procedure: RIGHT WRIST OPEN REDUCTION INTERNAL FIXATION (ORIF) DISTAL RADIAL FRACTURE AND REPAIR AS INDICATED;  Surgeon: Iran Planas, MD;  Location: [place];  Service: Orthopedics;  Laterality: Right;  . PARTIAL HYSTERECTOMY    . ROTATOR CUFF REPAIR Right 2009  . SHOULDER SURGERY Right    shoulder dislocation, fall, and elbow unla nerve  . SHOULDER SURGERY Right 2015   labral tear  . TUBAL LIGATION    . ULNAR NERVE REPAIR Right 08/19/2014    Social History   Tobacco Use  . Smoking status: Former Smoker    Last attempt to quit: 08/04/1998    Years since quitting: 20.2  . Smokeless tobacco: Never Used  Substance Use Topics    . Alcohol use: No    Family History  Problem Relation Age of Onset  . Heart disease Mother 87       AMI/CAD/CHF as cause of death  . Hypertension Mother   . Hyperlipidemia Mother   . Hypothyroidism Mother   . Depression Mother   . Gout Mother   . Heart disease Father 68  AMI  . Hypertension Father   . Stroke Father 63       mild CVA  . Prostate cancer Father   . Hyperlipidemia Father   . Cancer Father 39       prostate cancer  . Hypertension Brother   . Hyperlipidemia Brother   . Cancer Brother        prostate cancer  . Cancer Maternal Grandmother        type unknown  . Cancer Maternal Grandfather        type unknown  . Heart disease Paternal Grandmother   . Heart defect Paternal Grandfather   . Melanoma Brother   . Hyperlipidemia Brother   . Hypertension Brother   . Cancer Brother        melanoma  . Hypertension Brother   . Hyperlipidemia Brother   . Melanoma Brother   . Cancer Brother 25       melanoma  . Cancer Sister        Basal cell carcinoma scalp  . Depression Sister   . Hypertension Brother   . Depression Brother   . Hypertension Brother   . Gout Brother   . Arthritis Sister   . Depression Sister     ROS Per hpi  OBJECTIVE:  Blood pressure 114/71, pulse 61, temperature 98.7 F (37.1 C), temperature source Oral, height 5\' 4"  (1.626 m), weight 259 lb 9.6 oz (117.8 kg), SpO2 96 %. Body mass index is 44.56 kg/m.   Physical Exam Vitals signs and nursing note reviewed.  Constitutional:      Appearance: She is well-developed.  HENT:     Head: Normocephalic and atraumatic.     Mouth/Throat:     Pharynx: No oropharyngeal exudate.  Eyes:     General: No scleral icterus.    Conjunctiva/sclera: Conjunctivae normal.     Pupils: Pupils are equal, round, and reactive to light.  Neck:     Musculoskeletal: Neck supple.  Cardiovascular:     Rate and Rhythm: Normal rate and regular rhythm.     Heart sounds: Normal heart sounds. No murmur. No  friction rub. No gallop.   Pulmonary:     Effort: Pulmonary effort is normal.     Breath sounds: Normal breath sounds. No wheezing or rales.  Skin:    General: Skin is warm and dry.  Neurological:     Mental Status: She is alert and oriented to person, place, and time.      ASSESSMENT and PLAN  1. Iron deficiency anemia, unspecified iron deficiency anemia type Patient is symptomatic. Seems related to diet. Sending to heme for eval of possible transfusion or IV iron. Start oral supplements. Discussed dietary supplements - Ambulatory referral to Gastroenterology - Ambulatory referral to Hematology  2. Hiatal hernia with GERD - Ambulatory referral to Gastroenterology  Other orders - ferrous sulfate 325 (65 FE) MG EC tablet; Take 1 tablet (325 mg total) by mouth 2 (two) times daily after a meal.  Return in about 4 weeks (around 11/09/2018).    Rutherford Guys, MD Primary Care at [place], [place] [address] Ph.  [phone] Fax [phone]

## 2018-10-12 NOTE — Telephone Encounter (Signed)
I received a call from [place] at [facility] to schedule [name] a hem appt. Pt has been scheduled to see Dr. Jana Hakim on 3/11 at [place] will notify the pt.

## 2018-10-13 ENCOUNTER — Encounter: Payer: Self-pay | Admitting: Oncology

## 2018-10-13 ENCOUNTER — Inpatient Hospital Stay: Payer: 59 | Attending: Oncology | Admitting: Oncology

## 2018-10-13 ENCOUNTER — Telehealth: Payer: Self-pay | Admitting: Oncology

## 2018-10-13 ENCOUNTER — Other Ambulatory Visit: Payer: Self-pay

## 2018-10-13 VITALS — BP 106/52 | HR 88 | Temp 98.8°F | Resp 18 | Ht 64.0 in | Wt 262.6 lb

## 2018-10-13 DIAGNOSIS — Z8711 Personal history of peptic ulcer disease: Secondary | ICD-10-CM | POA: Diagnosis not present

## 2018-10-13 DIAGNOSIS — I130 Hypertensive heart and chronic kidney disease with heart failure and stage 1 through stage 4 chronic kidney disease, or unspecified chronic kidney disease: Secondary | ICD-10-CM | POA: Insufficient documentation

## 2018-10-13 DIAGNOSIS — Z79899 Other long term (current) drug therapy: Secondary | ICD-10-CM | POA: Insufficient documentation

## 2018-10-13 DIAGNOSIS — N189 Chronic kidney disease, unspecified: Secondary | ICD-10-CM | POA: Diagnosis not present

## 2018-10-13 DIAGNOSIS — F329 Major depressive disorder, single episode, unspecified: Secondary | ICD-10-CM | POA: Diagnosis not present

## 2018-10-13 DIAGNOSIS — D508 Other iron deficiency anemias: Secondary | ICD-10-CM

## 2018-10-13 DIAGNOSIS — E785 Hyperlipidemia, unspecified: Secondary | ICD-10-CM | POA: Diagnosis not present

## 2018-10-13 DIAGNOSIS — Z87891 Personal history of nicotine dependence: Secondary | ICD-10-CM | POA: Diagnosis not present

## 2018-10-13 DIAGNOSIS — K219 Gastro-esophageal reflux disease without esophagitis: Secondary | ICD-10-CM | POA: Diagnosis not present

## 2018-10-13 DIAGNOSIS — K449 Diaphragmatic hernia without obstruction or gangrene: Secondary | ICD-10-CM | POA: Insufficient documentation

## 2018-10-13 DIAGNOSIS — G629 Polyneuropathy, unspecified: Secondary | ICD-10-CM | POA: Insufficient documentation

## 2018-10-13 DIAGNOSIS — G4730 Sleep apnea, unspecified: Secondary | ICD-10-CM | POA: Diagnosis not present

## 2018-10-13 DIAGNOSIS — F419 Anxiety disorder, unspecified: Secondary | ICD-10-CM | POA: Insufficient documentation

## 2018-10-13 DIAGNOSIS — M797 Fibromyalgia: Secondary | ICD-10-CM | POA: Diagnosis not present

## 2018-10-13 DIAGNOSIS — D509 Iron deficiency anemia, unspecified: Secondary | ICD-10-CM | POA: Insufficient documentation

## 2018-10-13 NOTE — Telephone Encounter (Signed)
Gave avs and calendar ° °

## 2018-10-21 ENCOUNTER — Ambulatory Visit: Payer: 59 | Admitting: Family Medicine

## 2018-10-21 ENCOUNTER — Other Ambulatory Visit: Payer: Self-pay

## 2018-10-21 ENCOUNTER — Inpatient Hospital Stay: Payer: 59

## 2018-10-21 VITALS — BP 124/70 | HR 65 | Temp 98.5°F | Resp 18

## 2018-10-21 DIAGNOSIS — D508 Other iron deficiency anemias: Secondary | ICD-10-CM

## 2018-10-21 DIAGNOSIS — K449 Diaphragmatic hernia without obstruction or gangrene: Secondary | ICD-10-CM | POA: Diagnosis not present

## 2018-10-21 DIAGNOSIS — D509 Iron deficiency anemia, unspecified: Secondary | ICD-10-CM | POA: Diagnosis not present

## 2018-10-21 DIAGNOSIS — N189 Chronic kidney disease, unspecified: Secondary | ICD-10-CM | POA: Diagnosis not present

## 2018-10-21 DIAGNOSIS — K219 Gastro-esophageal reflux disease without esophagitis: Secondary | ICD-10-CM | POA: Diagnosis not present

## 2018-10-21 DIAGNOSIS — M797 Fibromyalgia: Secondary | ICD-10-CM | POA: Diagnosis not present

## 2018-10-21 DIAGNOSIS — I130 Hypertensive heart and chronic kidney disease with heart failure and stage 1 through stage 4 chronic kidney disease, or unspecified chronic kidney disease: Secondary | ICD-10-CM | POA: Diagnosis not present

## 2018-10-21 DIAGNOSIS — E785 Hyperlipidemia, unspecified: Secondary | ICD-10-CM | POA: Diagnosis not present

## 2018-10-21 DIAGNOSIS — G4730 Sleep apnea, unspecified: Secondary | ICD-10-CM | POA: Diagnosis not present

## 2018-10-21 DIAGNOSIS — G629 Polyneuropathy, unspecified: Secondary | ICD-10-CM | POA: Diagnosis not present

## 2018-10-21 MED ORDER — SODIUM CHLORIDE 0.9 % IV SOLN
510.0000 mg | Freq: Once | INTRAVENOUS | Status: AC
  Administered 2018-10-21: 510 mg via INTRAVENOUS
  Filled 2018-10-21: qty 17

## 2018-10-21 MED ORDER — SODIUM CHLORIDE 0.9 % IV SOLN
750.0000 mL | Freq: Once | INTRAVENOUS | Status: AC
  Administered 2018-10-21: 250 mL via INTRAVENOUS
  Filled 2018-10-21: qty 750

## 2018-10-21 NOTE — Patient Instructions (Signed)

## 2018-10-28 ENCOUNTER — Inpatient Hospital Stay: Payer: 59

## 2018-10-28 ENCOUNTER — Other Ambulatory Visit: Payer: Self-pay

## 2018-10-28 VITALS — BP 134/78 | HR 57 | Temp 98.0°F | Resp 18

## 2018-10-28 DIAGNOSIS — K449 Diaphragmatic hernia without obstruction or gangrene: Secondary | ICD-10-CM | POA: Diagnosis not present

## 2018-10-28 DIAGNOSIS — D509 Iron deficiency anemia, unspecified: Secondary | ICD-10-CM | POA: Diagnosis not present

## 2018-10-28 DIAGNOSIS — I130 Hypertensive heart and chronic kidney disease with heart failure and stage 1 through stage 4 chronic kidney disease, or unspecified chronic kidney disease: Secondary | ICD-10-CM | POA: Diagnosis not present

## 2018-10-28 DIAGNOSIS — N189 Chronic kidney disease, unspecified: Secondary | ICD-10-CM | POA: Diagnosis not present

## 2018-10-28 DIAGNOSIS — G4730 Sleep apnea, unspecified: Secondary | ICD-10-CM | POA: Diagnosis not present

## 2018-10-28 DIAGNOSIS — D508 Other iron deficiency anemias: Secondary | ICD-10-CM

## 2018-10-28 DIAGNOSIS — M797 Fibromyalgia: Secondary | ICD-10-CM | POA: Diagnosis not present

## 2018-10-28 DIAGNOSIS — K219 Gastro-esophageal reflux disease without esophagitis: Secondary | ICD-10-CM | POA: Diagnosis not present

## 2018-10-28 DIAGNOSIS — E785 Hyperlipidemia, unspecified: Secondary | ICD-10-CM | POA: Diagnosis not present

## 2018-10-28 DIAGNOSIS — G629 Polyneuropathy, unspecified: Secondary | ICD-10-CM | POA: Diagnosis not present

## 2018-10-28 MED ORDER — ACETAMINOPHEN 325 MG PO TABS
650.0000 mg | ORAL_TABLET | Freq: Once | ORAL | Status: AC
  Administered 2018-10-28: 650 mg via ORAL

## 2018-10-28 MED ORDER — DIPHENHYDRAMINE HCL 25 MG PO CAPS
ORAL_CAPSULE | ORAL | Status: AC
  Filled 2018-10-28: qty 1

## 2018-10-28 MED ORDER — SODIUM CHLORIDE 0.9 % IV SOLN
510.0000 mg | Freq: Once | INTRAVENOUS | Status: AC
  Administered 2018-10-28: 510 mg via INTRAVENOUS
  Filled 2018-10-28: qty 17

## 2018-10-28 MED ORDER — SODIUM CHLORIDE 0.9 % IV SOLN
INTRAVENOUS | Status: DC
  Administered 2018-10-28: 09:00:00 via INTRAVENOUS
  Filled 2018-10-28: qty 250

## 2018-10-28 MED ORDER — DIPHENHYDRAMINE HCL 25 MG PO CAPS
25.0000 mg | ORAL_CAPSULE | Freq: Once | ORAL | Status: AC
  Administered 2018-10-28: 25 mg via ORAL

## 2018-10-28 MED ORDER — ACETAMINOPHEN 325 MG PO TABS
ORAL_TABLET | ORAL | Status: AC
  Filled 2018-10-28: qty 2

## 2018-10-28 NOTE — Patient Instructions (Signed)

## 2018-10-29 ENCOUNTER — Ambulatory Visit: Payer: 59 | Admitting: Gastroenterology

## 2018-11-03 ENCOUNTER — Other Ambulatory Visit: Payer: Self-pay

## 2018-11-03 DIAGNOSIS — D508 Other iron deficiency anemias: Secondary | ICD-10-CM

## 2018-11-05 ENCOUNTER — Telehealth: Payer: Self-pay | Admitting: *Deleted

## 2018-11-05 NOTE — Telephone Encounter (Signed)
   Primary Cardiologist:  Glenetta Hew, MD   Patient contacted.  History reviewed.  No symptoms to suggest any unstable cardiac conditions.  Based on discussion, with current pandemic situation, we will be postponing this appointment for [name] with a plan for f/u in  >12wks or sooner if feasible/necessary.  If symptoms change, she has been instructed to contact our office.   Routing to C19 CANCEL pool for tracking   Raiford Simmonds, RN  11/05/2018 11:00 AM         .

## 2018-11-10 ENCOUNTER — Other Ambulatory Visit: Payer: Self-pay

## 2018-11-10 ENCOUNTER — Ambulatory Visit (INDEPENDENT_AMBULATORY_CARE_PROVIDER_SITE_OTHER): Payer: 59 | Admitting: Family Medicine

## 2018-11-10 DIAGNOSIS — D508 Other iron deficiency anemias: Secondary | ICD-10-CM | POA: Diagnosis not present

## 2018-11-10 NOTE — Progress Notes (Signed)
Nurse visit

## 2018-11-11 ENCOUNTER — Ambulatory Visit: Payer: 59 | Admitting: Cardiology

## 2018-11-11 ENCOUNTER — Other Ambulatory Visit: Payer: Self-pay | Admitting: Family Medicine

## 2018-11-11 LAB — IRON,TIBC AND FERRITIN PANEL
Ferritin: 260 ng/mL — ABNORMAL HIGH (ref 15–150)
Iron Saturation: 23 % (ref 15–55)
Iron: 59 ug/dL (ref 27–139)
Total Iron Binding Capacity: 262 ug/dL (ref 250–450)
UIBC: 203 ug/dL (ref 118–369)

## 2018-11-11 LAB — CBC WITH DIFFERENTIAL/PLATELET
Basophils Absolute: 0 10*3/uL (ref 0.0–0.2)
Basos: 0 %
EOS (ABSOLUTE): 0.1 10*3/uL (ref 0.0–0.4)
Eos: 3 %
Hematocrit: 37.2 % (ref 34.0–46.6)
Hemoglobin: 11.2 g/dL (ref 11.1–15.9)
Immature Grans (Abs): 0 10*3/uL (ref 0.0–0.1)
Immature Granulocytes: 0 %
Lymphocytes Absolute: 1.7 10*3/uL (ref 0.7–3.1)
Lymphs: 36 %
MCH: 25.6 pg — ABNORMAL LOW (ref 26.6–33.0)
MCHC: 30.1 g/dL — ABNORMAL LOW (ref 31.5–35.7)
MCV: 85 fL (ref 79–97)
Monocytes Absolute: 0.4 10*3/uL (ref 0.1–0.9)
Monocytes: 8 %
Neutrophils Absolute: 2.6 10*3/uL (ref 1.4–7.0)
Neutrophils: 53 %
Platelets: 260 10*3/uL (ref 150–450)
RBC: 4.37 x10E6/uL (ref 3.77–5.28)
RDW: 23.6 % — ABNORMAL HIGH (ref 11.7–15.4)
WBC: 4.8 10*3/uL (ref 3.4–10.8)

## 2018-11-12 ENCOUNTER — Telehealth (INDEPENDENT_AMBULATORY_CARE_PROVIDER_SITE_OTHER): Payer: 59 | Admitting: Family Medicine

## 2018-11-12 ENCOUNTER — Other Ambulatory Visit: Payer: Self-pay

## 2018-11-12 DIAGNOSIS — D509 Iron deficiency anemia, unspecified: Secondary | ICD-10-CM

## 2018-11-12 DIAGNOSIS — R059 Cough, unspecified: Secondary | ICD-10-CM

## 2018-11-12 DIAGNOSIS — R05 Cough: Secondary | ICD-10-CM

## 2018-11-12 MED ORDER — BENZONATATE 100 MG PO CAPS: 100.0000 mg | ORAL_CAPSULE | Freq: Two times a day (BID) | ORAL | 1 refills | Status: DC | PRN

## 2018-11-12 MED FILL — BENZONATATE 100 MG CAP: 100 | 10 days supply | Qty: 20 | Fill #0

## 2018-11-12 NOTE — Progress Notes (Signed)
Virtual Visit via telephone Note  I connected with patient on 11/12/18 at 1110am by telephone and verified that I am speaking with the correct person using two identifiers. [name] is currently located at home and patient is currently with her during visit. The provider, Rutherford Guys, MD is located in their office at time of visit.  I discussed the limitations, risks, security and privacy concerns of performing an evaluation and management service by telephone and the availability of in person appointments. I also discussed with the patient that there may be a patient responsible charge related to this service. The patient expressed understanding and agreed to proceed.  Telephone visit today for followup on anemia  HPI ? 63yo Female with PMH of HTN, HLP, dCHF, OSA on cpap, morbid obesity, GERD, pre-diabetes who presents today for followup on iron deficiency anemia  Last OV march 2020 Has seen heme - received IV iron x 2, followup for labs scheduled Has not seen GI, they cancelled due to covid 19  Feeling much better, not dizzy or weak Itchy skin and RLS has resolved Not taking iron supplements Has not increased dietary iron intake  requesting refill of tessalon pearls which she takes prn for coughing related to allergies  BP Readings from Last 3 Encounters:  10/28/18 134/78  10/21/18 124/70  10/13/18 (!) 106/52   CBC Latest Ref Rng & Units 11/10/2018 10/08/2018 02/19/2018  WBC 3.4 - 10.8 x10E3/uL 4.8 5.3 5.5  Hemoglobin 11.1 - 15.9 g/dL 11.2 9.1(A) 10.5(L)  Hematocrit 34.0 - 46.6 % 37.2 29.4 33.7(L)  Platelets 150 - 450 x10E3/uL 260 - 256   Lab Results  Component Value Date   IRON 59 11/10/2018   TIBC 262 11/10/2018   FERRITIN 260 (H) 11/10/2018    Fall Risk  11/12/2018 10/12/2018 10/08/2018 09/17/2018 06/14/2018  Falls in the past year? 0 0 0 0 0  Number falls in past yr: 0 0 0 - -  Injury with Fall? 0 0 0 0 -  Comment - - - - -     Depression screen [facility] 2/9  11/12/2018 10/12/2018 10/08/2018  Decreased Interest 0 0 0  Down, Depressed, Hopeless 1 0 0  PHQ - 2 Score 1 0 0  Altered sleeping - - -  Tired, decreased energy - - -  Change in appetite - - -  Feeling bad or failure about yourself  - - -  Trouble concentrating - - -  Moving slowly or fidgety/restless - - -  Suicidal thoughts - - -  PHQ-9 Score - - -  Difficult doing work/chores - - -  Some recent data might be hidden    Allergies  Allergen Reactions  . Sulfa Antibiotics Itching    Prior to Admission medications   Medication Sig Start Date End Date Taking? Authorizing Provider  Albuterol Sulfate (PROAIR RESPICLICK) 470 (90 Base) MCG/ACT AEPB Inhale 2 puffs into the lungs 4 (four) times daily as needed. 12/10/17  Yes Rutherford Guys, MD  atorvastatin (LIPITOR) 10 MG tablet Take 1 tablet (10 mg total) by mouth daily at 6 PM. 10/28/17  Yes Wardell Honour, MD  cyclobenzaprine (FLEXERIL) 10 MG tablet TAKE 1/2-1 TABLET BY MOUTH 3 TIMES DAILY AS NEEDED FOR MUSCLE SPASMS. 08/26/18  Yes Rutherford Guys, MD  dexlansoprazole (DEXILANT) 60 MG capsule Take 1 capsule (60 mg total) by mouth daily. 06/14/18  Yes Rutherford Guys, MD  docusate sodium (COLACE) 100 MG capsule TAKE 1 CAPSULE TWO TIMES  DAILY 06/14/18  Yes Rutherford Guys, MD  DULoxetine (CYMBALTA) 60 MG capsule Take 1 capsule (60 mg total) by mouth daily. 02/20/18  Yes Wardell Honour, MD  famotidine (PEPCID) 20 MG tablet Take 1 tablet (20 mg total) by mouth at bedtime. 09/01/18  Yes Rutherford Guys, MD  gabapentin (NEURONTIN) 300 MG capsule Take 1-2 capsules (300-600 mg total) by mouth 4 (four) times daily. 02/20/18  Yes Wardell Honour, MD  lisinopril (PRINIVIL,ZESTRIL) 20 MG tablet Take 1 tablet (20 mg total) by mouth daily. 10/28/17  Yes Wardell Honour, MD  ondansetron (ZOFRAN-ODT) 4 MG disintegrating tablet DISSOLVE 1 TABLET BY MOUTH EVERY 8 HOURS AS NEEDED FOR NAUSEA OR VOMITING. 08/26/18  Yes Rutherford Guys, MD  oxybutynin (DITROPAN  XL) 10 MG 24 hr tablet Take 1 tablet (10 mg total) by mouth at bedtime. 06/21/18  Yes Rutherford Guys, MD  oxyCODONE-acetaminophen (PERCOCET/ROXICET) 5-325 MG tablet Take 1 tablet by mouth every 12 (twelve) hours as needed for severe pain. 06/14/18  Yes Rutherford Guys, MD  propranolol (INDERAL) 60 MG tablet Take 1 tablet (60 mg total) by mouth 3 (three) times daily. 09/29/18  Yes Lendon Colonel, NP  sertraline (ZOLOFT) 50 MG tablet Take 1 tablet (50 mg total) by mouth daily. 02/20/18  Yes [place] Honour, MD  topiramate (TOPAMAX) 50 MG tablet Take 1 tablet (50 mg total) by mouth 2 (two) times daily. 08/26/18  Yes Rutherford Guys, MD    Past Medical History:  Diagnosis Date  . Allergy    [name], Flonase  . Anemia   . Anxiety   . Arthritis   . Chronic kidney disease   . Colon polyps   . Depression   . Fibromyalgia   . Gastritis   . GERD (gastroesophageal reflux disease)   . Hernia, hiatal   . HLD (hyperlipidemia)   . Hypertension   . Migraine   . Neuropathy   . Osteoporosis   . Pneumonia   . Sleep apnea with use of continuous positive airway pressure (CPAP)   . Thyroid goiter     Past Surgical History:  Procedure Laterality Date  . ABDOMINAL HYSTERECTOMY     ovaries intact; DUB.  No dysplasia.  Marland Kitchen JOINT REPLACEMENT    . OPEN REDUCTION INTERNAL FIXATION (ORIF) DISTAL RADIAL FRACTURE Right 07/09/2017   Procedure: RIGHT WRIST OPEN REDUCTION INTERNAL FIXATION (ORIF) DISTAL RADIAL FRACTURE AND REPAIR AS INDICATED;  Surgeon: Iran Planas, MD;  Location: [place];  Service: Orthopedics;  Laterality: Right;  . PARTIAL HYSTERECTOMY    . ROTATOR CUFF REPAIR Right 2009  . SHOULDER SURGERY Right    shoulder dislocation, fall, and elbow unla nerve  . SHOULDER SURGERY Right 2015   labral tear  . TUBAL LIGATION    . ULNAR NERVE REPAIR Right 08/19/2014    Social History   Tobacco Use  . Smoking status: Former Smoker    Last attempt to quit: 08/04/1998    Years since quitting: 20.2   . Smokeless tobacco: Never Used  Substance Use Topics  . Alcohol use: No    Family History  Problem Relation Age of Onset  . Heart disease Mother 14       AMI/CAD/CHF as cause of death  . Hypertension Mother   . Hyperlipidemia Mother   . Hypothyroidism Mother   . Depression Mother   . Gout Mother   . Heart disease Father 20       AMI  .  Hypertension Father   . Stroke Father 51       mild CVA  . Prostate cancer Father   . Hyperlipidemia Father   . Cancer Father 44       prostate cancer  . Hypertension Brother   . Hyperlipidemia Brother   . Cancer Brother        prostate cancer  . Cancer Maternal Grandmother        type unknown  . Cancer Maternal Grandfather        type unknown  . Heart disease Paternal Grandmother   . Heart defect Paternal Grandfather   . Melanoma Brother   . Hyperlipidemia Brother   . Hypertension Brother   . Cancer Brother        melanoma  . Hypertension Brother   . Hyperlipidemia Brother   . Melanoma Brother   . Cancer Brother 25       melanoma  . Cancer Sister        Basal cell carcinoma scalp  . Depression Sister   . Hypertension Brother   . Depression Brother   . Hypertension Brother   . Gout Brother   . Arthritis Sister   . Depression Sister     ROS Per hpi  Objective  Vitals as reported by the patient: none  There were no vitals filed for this visit.  ASSESSMENT and PLAN  1. Iron deficiency anemia, unspecified iron deficiency anemia type Resolved after iron transfusions. GI visit pending. Discussed dietary sources of iron, handout provided.   2. Cough - benzonatate (TESSALON) 100 MG capsule; Take 1 capsule (100 mg total) by mouth 2 (two) times daily as needed for cough.  FOLLOW-UP: 6 months   The above assessment and management plan was discussed with the patient. The patient verbalized understanding of and has agreed to the management plan. Patient is aware to call the clinic if symptoms persist or worsen. Patient is  aware when to return to the clinic for a follow-up visit. Patient educated on when it is appropriate to go to the emergency department.    I provided 12 minutes of non-face-to-face time during this encounter.  Rutherford Guys, MD Primary Care at [place], [place] [address] Ph.  [phone] Fax [phone]

## 2018-11-12 NOTE — Patient Instructions (Signed)

## 2018-11-12 NOTE — Progress Notes (Signed)
Anemia F/u

## 2018-11-24 ENCOUNTER — Inpatient Hospital Stay: Payer: 59 | Attending: Oncology

## 2018-11-24 ENCOUNTER — Other Ambulatory Visit: Payer: Self-pay

## 2018-11-24 DIAGNOSIS — D509 Iron deficiency anemia, unspecified: Secondary | ICD-10-CM | POA: Diagnosis present

## 2018-11-24 DIAGNOSIS — D508 Other iron deficiency anemias: Secondary | ICD-10-CM | POA: Diagnosis not present

## 2018-11-24 LAB — CBC WITH DIFFERENTIAL/PLATELET
Abs Immature Granulocytes: 0 10*3/uL (ref 0.00–0.07)
Basophils Absolute: 0 10*3/uL (ref 0.0–0.1)
Basophils Relative: 1 %
Eosinophils Absolute: 0.1 10*3/uL (ref 0.0–0.5)
Eosinophils Relative: 3 %
HCT: 40.6 % (ref 36.0–46.0)
Hemoglobin: 12.7 g/dL (ref 12.0–15.0)
Immature Granulocytes: 0 %
Lymphocytes Relative: 40 %
Lymphs Abs: 1.7 10*3/uL (ref 0.7–4.0)
MCH: 26.7 pg (ref 26.0–34.0)
MCHC: 31.3 g/dL (ref 30.0–36.0)
MCV: 85.3 fL (ref 80.0–100.0)
Monocytes Absolute: 0.4 10*3/uL (ref 0.1–1.0)
Monocytes Relative: 9 %
Neutro Abs: 2.1 10*3/uL (ref 1.7–7.7)
Neutrophils Relative %: 47 %
Platelets: 181 10*3/uL (ref 150–400)
RBC: 4.76 MIL/uL (ref 3.87–5.11)
WBC: 4.3 10*3/uL (ref 4.0–10.5)
nRBC: 0 % (ref 0.0–0.2)

## 2018-11-24 LAB — IRON AND TIBC
Iron: 66 ug/dL (ref 41–142)
Saturation Ratios: 24 % (ref 21–57)
TIBC: 274 ug/dL (ref 236–444)
UIBC: 208 ug/dL (ref 120–384)

## 2018-11-24 LAB — FERRITIN: Ferritin: 67 ng/mL (ref 11–307)

## 2018-11-24 LAB — RETICULOCYTES
Immature Retic Fract: 12.3 % (ref 2.3–15.9)
RBC.: 4.81 MIL/uL (ref 3.87–5.11)
Retic Count, Absolute: 68.8 10*3/uL (ref 19.0–186.0)
Retic Ct Pct: 1.4 % (ref 0.4–3.1)

## 2018-11-26 ENCOUNTER — Other Ambulatory Visit: Payer: Self-pay | Admitting: Oncology

## 2018-11-26 NOTE — Progress Notes (Signed)
I called Neace to discuss her lab work with her.  She has a hemoglobin of 12.7, the MCV is up to 85.3, platelets and white cells are normal, ferritin and iron saturation are normal.  She has not started her iron oral supplementation.  I told her to take 1 tablet daily and of course if it upsets her stomach or constipated treatment she can stop.  I am setting her up for lab work every 3 months and to see me again in a year.  Of course if she becomes iron deficient again we will have to give her some more Feraheme.  She has a follow-up with Dr. Ardis Hughs 12/07/2018

## 2018-11-26 NOTE — Telephone Encounter (Signed)
    Called pt, was able to schedule her w/ Dr Ellyn Hack for July 27th at 9:40 am.   She does not have a smart-phone, but would be able to do a phone visit if needed.  Rosaria Ferries, PA-C 11/26/2018 3:45 PM Beeper [phone]

## 2018-11-29 ENCOUNTER — Telehealth: Payer: Self-pay | Admitting: Oncology

## 2018-11-29 NOTE — Telephone Encounter (Signed)
Scheduled appts per 4/24 sch msg

## 2018-12-07 ENCOUNTER — Other Ambulatory Visit: Payer: Self-pay

## 2018-12-07 ENCOUNTER — Ambulatory Visit (INDEPENDENT_AMBULATORY_CARE_PROVIDER_SITE_OTHER): Payer: 59 | Admitting: Gastroenterology

## 2018-12-07 ENCOUNTER — Encounter: Payer: Self-pay | Admitting: Gastroenterology

## 2018-12-07 VITALS — Ht 65.0 in | Wt 262.0 lb

## 2018-12-07 DIAGNOSIS — D509 Iron deficiency anemia, unspecified: Secondary | ICD-10-CM | POA: Diagnosis not present

## 2018-12-07 MED ORDER — PEG 3350-KCL-NA BICARB-NACL 420 G PO SOLR: 4000.0000 mL | ORAL | 0 refills | Status: DC

## 2018-12-07 MED FILL — ONDANSETRON ODT 4 MG TABLET: 4 | 7 days supply | Qty: 20 | Fill #0

## 2018-12-07 MED FILL — CYCLOBENZAPRINE HCL 10 MG T: 10 | 60 days supply | Qty: 180 | Fill #0

## 2018-12-07 MED FILL — SM ACID REDUCER 10 MG TABS: 10 | 30 days supply | Qty: 60 | Fill #0

## 2018-12-07 MED FILL — TOPIRAMATE 50 MG TABLET: 50 | 90 days supply | Qty: 180 | Fill #0

## 2018-12-07 NOTE — Progress Notes (Signed)
Review of pertinent gastrointestinal problems: 1.  Routine risk for colon cancer.  Colonoscopy October 2011, Dr. Ferdinand Lango, indications "colon cancer screening, constipation, abdominal pain". Findings to subcentimeter polyps were removed. The prep was adequate. Colon was otherwise normal. Pathology report shows the polyps were both hyperplastic and Dr. Ferdinand Lango recommended repeat colonoscopy at 10 year interval. 2.  Transferred to [place] for chronic nausea, vomiting 2017Upper endoscopy August 2011, Dr. Ferdinand Lango, done for "abdominal pain and GERD". Findings gastritis, esophagitis, 15 mm antral ulcer. Multiple biopsies were taken. Biopsies showed no H. pylori. No intestinal metaplasia. He recommended Carafate and repeat upper endoscopy in 2 months. Upper endoscopy October 2011, Dr. Ferdinand Lango, done for "gastric ulcer follow-up, abdominal pain". Findings 7 mm ulcer in the antrum persists, this was smaller than it was previously measured. Multiple repeat biopsies were taken. No H. pylori was noted. No intestinal metaplasia. He again recommended Carafate and again recommended repeat EGD in 2 months. Upper endoscopy December 2011, Dr. Ferdinand Lango, done for "gastric ulcer follow-up and GERD". Findings gastritis, healed ulcer. He again took multiple biopsies and showed no H. pylori and no Barrett's esophagus.  EGD January 2018 Dr. Ardis Hughs found a large hiatal hernia, mild H. pylori negative gastritis.  I felt that her nausea vomiting was possibly from her medicines Wellbutrin, Prozac and narcotics as well as morbid obesity, BMI was 41    This service was provided via virtual visit.  I tried to connect with audio and visual however she was unable to use the visual components and so only audio was used.  The patient was located at home.  I was located in my office.  The patient did consent to this virtual visit and is aware of possible charges through their insurance for this visit.  She is an established patient however she is  here with a new problem, referred by her primary care physician my certified medical assistant, Grace Bushy, contributed to this visit by contacting the patient by phone 1 or 2 business days prior to the appointment and also followed up on the recommendations I made after the visit.  Time spent on virtual visit: 25 min   HPI: This is a very pleasant 63 year old woman whom I last saw a little over 2 years ago.  She is here today to discuss a new problem, referred by her primary care physician Dr. Tamala Julian.  She was recently found to have hemoglobin 9.5, low MCV in the 70s.  She was eventually referred to hematology and she has undergone 2 IV infusions.  She was found to have low iron levels with a ferritin around 6 and low total iron.  After her 2 iron infusions her hemoglobin has normalized and her MCV has also normalized.  She is taking oral iron once daily as well now  She was weak and dizzy, fatigued prior to the iron infusions.  Feels much better since iron infusion.  She had not seen any overt GI Bleeding.    Gets black stools with pepto only.  She has pyrosis (dexilant in AM and pepcid QHS).  These meds work very well.  Has been on this regimen for 2 years.  Overall been gaining weight lately, says it's because of a couple falls and relative inactivity.    No serious abd pains but does have 2 weeks of burning in her epigastric region.  No NSAIDs really ever.  Chief complaint is iron deficiency anemia  ROS: complete GI ROS as described in HPI, all other review negative.  Constitutional:  No unintentional weight loss   Past Medical History:  Diagnosis Date  . Allergy    [name], Flonase  . Anemia   . Anxiety   . Arthritis   . Chronic kidney disease   . Colon polyps   . Depression   . Fibromyalgia   . Gastritis   . GERD (gastroesophageal reflux disease)   . Hernia, hiatal   . HLD (hyperlipidemia)   . Hypertension   . Migraine   . Neuropathy   . Osteoporosis   .  Pneumonia   . Sleep apnea with use of continuous positive airway pressure (CPAP)   . Thyroid goiter     Past Surgical History:  Procedure Laterality Date  . ABDOMINAL HYSTERECTOMY     ovaries intact; DUB.  No dysplasia.  Marland Kitchen JOINT REPLACEMENT    . OPEN REDUCTION INTERNAL FIXATION (ORIF) DISTAL RADIAL FRACTURE Right 07/09/2017   Procedure: RIGHT WRIST OPEN REDUCTION INTERNAL FIXATION (ORIF) DISTAL RADIAL FRACTURE AND REPAIR AS INDICATED;  Surgeon: Iran Planas, MD;  Location: [facility];  Service: Orthopedics;  Laterality: Right;  . PARTIAL HYSTERECTOMY    . ROTATOR CUFF REPAIR Right 2009  . SHOULDER SURGERY Right    shoulder dislocation, fall, and elbow unla nerve  . SHOULDER SURGERY Right 2015   labral tear  . TUBAL LIGATION    . ULNAR NERVE REPAIR Right 08/19/2014    Current Outpatient Medications  Medication Sig Dispense Refill  . Albuterol Sulfate (PROAIR RESPICLICK) 244 (90 Base) MCG/ACT AEPB Inhale 2 puffs into the lungs 4 (four) times daily as needed. 1 each 1  . atorvastatin (LIPITOR) 10 MG tablet Take 1 tablet (10 mg total) by mouth daily at 6 PM. 90 tablet 3  . benzonatate (TESSALON) 100 MG capsule Take 1 capsule (100 mg total) by mouth 2 (two) times daily as needed for cough. 20 capsule 1  . cyclobenzaprine (FLEXERIL) 10 MG tablet TAKE 1/2-1 TABLET BY MOUTH 3 TIMES DAILY AS NEEDED FOR MUSCLE SPASMS. 180 tablet 1  . dexlansoprazole (DEXILANT) 60 MG capsule Take 1 capsule (60 mg total) by mouth daily. 90 capsule 3  . docusate sodium (COLACE) 100 MG capsule TAKE 1 CAPSULE TWO TIMES DAILY 30 capsule 2  . DULoxetine (CYMBALTA) 60 MG capsule Take 1 capsule (60 mg total) by mouth daily. 90 capsule 1  . famotidine (PEPCID) 20 MG tablet Take 1 tablet (20 mg total) by mouth at bedtime. 90 tablet 1  . gabapentin (NEURONTIN) 300 MG capsule Take 1-2 capsules (300-600 mg total) by mouth 4 (four) times daily. 540 capsule 1  . lisinopril (PRINIVIL,ZESTRIL) 20 MG tablet Take 1 tablet (20 mg  total) by mouth daily. 90 tablet 3  . Multiple Vitamins-Iron (MULTIVITAMINS WITH IRON) TABS tablet Take 1 tablet by mouth daily.    . ondansetron (ZOFRAN-ODT) 4 MG disintegrating tablet DISSOLVE 1 TABLET BY MOUTH EVERY 8 HOURS AS NEEDED FOR NAUSEA OR VOMITING. 20 tablet 4  . oxybutynin (DITROPAN XL) 10 MG 24 hr tablet Take 1 tablet (10 mg total) by mouth at bedtime. 90 tablet 1  . oxyCODONE-acetaminophen (PERCOCET/ROXICET) 5-325 MG tablet Take 1 tablet by mouth every 12 (twelve) hours as needed for severe pain. 30 tablet 0  . propranolol (INDERAL) 60 MG tablet Take 1 tablet (60 mg total) by mouth 3 (three) times daily. 270 tablet 2  . sertraline (ZOLOFT) 50 MG tablet Take 1 tablet (50 mg total) by mouth daily. 90 tablet 1  . topiramate (TOPAMAX) 50 MG tablet  Take 1 tablet (50 mg total) by mouth 2 (two) times daily. 180 tablet 1   No current facility-administered medications for this visit.     Allergies as of 12/07/2018 - Review Complete 12/07/2018  Allergen Reaction Noted  . Sulfa antibiotics Itching 01/25/2014    Family History  Problem Relation Age of Onset  . Heart disease Mother 33       AMI/CAD/CHF as cause of death  . Hypertension Mother   . Hyperlipidemia Mother   . Hypothyroidism Mother   . Depression Mother   . Gout Mother   . Heart disease Father 21       AMI  . Hypertension Father   . Stroke Father 42       mild CVA  . Prostate cancer Father   . Hyperlipidemia Father   . Cancer Father 32       prostate cancer  . Hypertension Brother   . Hyperlipidemia Brother   . Cancer Brother        prostate cancer  . Cancer Maternal Grandmother        type unknown  . Cancer Maternal Grandfather        type unknown  . Heart disease Paternal Grandmother   . Heart defect Paternal Grandfather   . Melanoma Brother   . Hyperlipidemia Brother   . Hypertension Brother   . Cancer Brother        melanoma  . Hypertension Brother   . Hyperlipidemia Brother   . Melanoma Brother    . Cancer Brother 25       melanoma  . Cancer Sister        Basal cell carcinoma scalp  . Depression Sister   . Hypertension Brother   . Depression Brother   . Hypertension Brother   . Gout Brother   . Arthritis Sister   . Depression Sister     Social History   Socioeconomic History  . Marital status: Married    Spouse name: Not on file  . Number of children: 3  . Years of education: Not on file  . Highest education level: Not on file  Occupational History  . Occupation: unemployed  Social Needs  . Financial resource strain: Not on file  . Food insecurity:    Worry: Not on file    Inability: Not on file  . Transportation needs:    Medical: Not on file    Non-medical: Not on file  Tobacco Use  . Smoking status: Former Smoker    Last attempt to quit: 08/04/1998    Years since quitting: 20.3  . Smokeless tobacco: Never Used  Substance and Sexual Activity  . Alcohol use: No  . Drug use: No  . Sexual activity: Yes    Birth control/protection: Surgical, Post-menopausal  Lifestyle  . Physical activity:    Days per week: Not on file    Minutes per session: Not on file  . Stress: Not on file  Relationships  . Social connections:    Talks on phone: Not on file    Gets together: Not on file    Attends religious service: Not on file    Active member of club or organization: Not on file    Attends meetings of clubs or organizations: Not on file    Relationship status: Not on file  . Intimate partner violence:    Fear of current or ex partner: Not on file    Emotionally abused: Not on file  Physically abused: Not on file    Forced sexual activity: Not on file  Other Topics Concern  . Not on file  Social History Narrative   Marital status: married x 20 years; moderately happy     Children: 3 children (47 [name], 85 Dianna, 40 April); 8 grandchildren; 0 gg     Lives: with husband/Steve, April, 2 granddaughters     Employment:  Unemployed; quit working 2015 with work  related injury; awaiting disability in 2018; disability approved in 02/2017.       Tobacco: quit smoking in 2016; smoked x 2 years.      Alcohol: none      Drugs: none      Exercise: rarely in 2018           Physical Exam: Unable to perform because this was a "telemed visit" due to current Covid-19 pandemic  Assessment and plan: 63 y.o. female with iron deficiency anemia  Unclear etiology but she does have a large hiatal hernia and that could certainly contribute.  Her last colonoscopy was about 9 years ago in [facility] and I recommended we repeat that for her now to check for possible growths neoplasm that could cause iron deficiency anemia.  At the same time I recommend an upper endoscopy, she has had a stomach ulcer in the past and perhaps that has returned.  Neoplasm of the stomach, esophagus will need to be excluded as well.  I see no reason for any further blood tests or imaging studies prior to then  Please see the "Patient Instructions" section for addition details about the plan.  Owens Loffler, MD [facility] 12/07/2018, 2:00 PM

## 2018-12-07 NOTE — Patient Instructions (Addendum)
We will arrange colonoscopy and EGD in the next 2 to 3 weeks ([place]) for iron deficiency anemia.  You have been scheduled for a colonoscopy/EGD. Please follow written instructions given to you at your visit today.  Please pick up your prep supplies at the pharmacy within the next 1-3 days. If you use inhalers (even only as needed), please bring them with you on the day of your procedure. Your physician has requested that you go to www.startemmi.com and enter the access code given to you at your visit today. This web site gives a general overview about your procedure. However, you should still follow specific instructions given to you by our office regarding your preparation for the procedure.

## 2018-12-14 MED FILL — PEG-3350 SOLUTION: 420 | 1 days supply | Qty: 4000 | Fill #0

## 2018-12-15 ENCOUNTER — Other Ambulatory Visit: Payer: Self-pay

## 2018-12-15 ENCOUNTER — Telehealth: Payer: Self-pay | Admitting: Family Medicine

## 2018-12-15 MED ORDER — ATORVASTATIN CALCIUM 10 MG PO TABS: 10.0000 mg | ORAL_TABLET | Freq: Every day | ORAL | 0 refills | Status: DC

## 2018-12-15 MED ORDER — LISINOPRIL 20 MG PO TABS: 20.0000 mg | ORAL_TABLET | Freq: Every day | ORAL | 0 refills | Status: DC

## 2018-12-15 MED FILL — LISINOPRIL 20 MG TABLET: 20 | 30 days supply | Qty: 30 | Fill #0

## 2018-12-15 MED FILL — ATORVASTATIN 10 MG TABLET: 10 | 30 days supply | Qty: 30 | Fill #0

## 2018-12-15 NOTE — Telephone Encounter (Signed)
Copied from [place] [id number]. Topic: Quick Communication - Rx Refill/Question >> Dec 15, 2018 10:41 AM Sheran Luz wrote: Medication: lisinopril (PRINIVIL,ZESTRIL) 20 MG tablet and atorvastatin (LIPITOR) 10 MG tablet  Patient is requesting refill of medications   Preferred Pharmacy (with phone number or street name):[place], [place] - [place] [phone] (Phone) [phone] (Fax)

## 2018-12-15 NOTE — Telephone Encounter (Signed)
Needs tele med for refills.

## 2018-12-19 ENCOUNTER — Telehealth: Payer: Self-pay | Admitting: *Deleted

## 2018-12-19 NOTE — Telephone Encounter (Signed)
LMOM that we will be calling tomorrow to go over covid screening questions 

## 2018-12-20 ENCOUNTER — Telehealth: Payer: Self-pay | Admitting: *Deleted

## 2018-12-20 NOTE — Telephone Encounter (Signed)
Covid-19 travel screening questions  Have you traveled in the last 14 days?no If yes where?  Do you now or have you had a fever in the last 14 days?no  Do you have any respiratory symptoms of shortness of breath or cough now or in the last 14 days?no  Do you have a medical history of Congestive Heart Failure?  Do you have a medical history of lung disease?  Do you have any family members or close contacts with diagnosed or suspected Covid-19?no  Pt made aware of care partner policy and will bring a mask with her. SM

## 2018-12-21 ENCOUNTER — Ambulatory Visit (AMBULATORY_SURGERY_CENTER): Payer: 59 | Admitting: Gastroenterology

## 2018-12-21 ENCOUNTER — Encounter: Payer: Self-pay | Admitting: Gastroenterology

## 2018-12-21 ENCOUNTER — Other Ambulatory Visit: Payer: Self-pay

## 2018-12-21 VITALS — BP 98/57 | HR 74 | Temp 98.4°F | Resp 17 | Ht 65.0 in | Wt 262.0 lb

## 2018-12-21 DIAGNOSIS — G4733 Obstructive sleep apnea (adult) (pediatric): Secondary | ICD-10-CM | POA: Diagnosis not present

## 2018-12-21 DIAGNOSIS — K449 Diaphragmatic hernia without obstruction or gangrene: Secondary | ICD-10-CM | POA: Diagnosis not present

## 2018-12-21 DIAGNOSIS — K257 Chronic gastric ulcer without hemorrhage or perforation: Secondary | ICD-10-CM

## 2018-12-21 DIAGNOSIS — Z1211 Encounter for screening for malignant neoplasm of colon: Secondary | ICD-10-CM

## 2018-12-21 DIAGNOSIS — I10 Essential (primary) hypertension: Secondary | ICD-10-CM | POA: Diagnosis not present

## 2018-12-21 DIAGNOSIS — D509 Iron deficiency anemia, unspecified: Secondary | ICD-10-CM

## 2018-12-21 DIAGNOSIS — D124 Benign neoplasm of descending colon: Secondary | ICD-10-CM

## 2018-12-21 DIAGNOSIS — D128 Benign neoplasm of rectum: Secondary | ICD-10-CM

## 2018-12-21 DIAGNOSIS — K219 Gastro-esophageal reflux disease without esophagitis: Secondary | ICD-10-CM | POA: Diagnosis not present

## 2018-12-21 DIAGNOSIS — D122 Benign neoplasm of ascending colon: Secondary | ICD-10-CM

## 2018-12-21 MED ORDER — SODIUM CHLORIDE 0.9 % IV SOLN: 500.0000 mL | Freq: Once | INTRAVENOUS | Status: DC

## 2018-12-21 NOTE — Progress Notes (Signed)
Report to PACU, RN, vss, BBS= Clear.  

## 2018-12-21 NOTE — Op Note (Signed)
[facility] Patient Name: [name] Procedure Date: 12/21/2018 2:11 PM MRN: [id number] Endoscopist: Milus Banister , MD Age: 63 Referring MD:  Date of Birth: 16-Nov-1955 Gender: Female Account #: [id number] Procedure:                Upper GI endoscopy Indications:              Iron deficiency anemia Medicines:                Monitored Anesthesia Care Procedure:                Pre-Anesthesia Assessment:                           - Prior to the procedure, a History and Physical                            was performed, and patient medications and                            allergies were reviewed. The patient's tolerance of                            previous anesthesia was also reviewed. The risks                            and benefits of the procedure and the sedation                            options and risks were discussed with the patient.                            All questions were answered, and informed consent                            was obtained. Prior Anticoagulants: The patient has                            taken no previous anticoagulant or antiplatelet                            agents. ASA Grade Assessment: II - A patient with                            mild systemic disease. After reviewing the risks                            and benefits, the patient was deemed in                            satisfactory condition to undergo the procedure.                           After obtaining informed consent, the endoscope was  passed under direct vision. Throughout the                            procedure, the patient's blood pressure, pulse, and                            oxygen saturations were monitored continuously. The                            Endoscope was introduced through the mouth, and                            advanced to the second part of duodenum. The upper                            GI endoscopy was accomplished  without difficulty.                            The patient tolerated the procedure well. Scope In: Scope Out: Findings:                 A medium-sized hiatal hernia was present with a few                            small but obvious Cameron's erosions.                           The exam was otherwise without abnormality. Complications:            No immediate complications. Estimated blood loss:                            None. Estimated Blood Loss:     Estimated blood loss: none. Impression:               - Medium-sized hiatal hernia with Cameron's type                            erosions. This is very possibly the source of her                            iron deficiency anemia (medium sized rectal polyp                            may have contributed as well).                           - The examination was otherwise normal. Recommendation:           - Patient has a contact number available for                            emergencies. The signs and symptoms of potential                            delayed complications were  discussed with the                            patient. Return to normal activities tomorrow.                            Written discharge instructions were provided to the                            patient.                           - Resume previous diet.                           - Continue present medications. Please continue                            taking once daily oral iron supplement,                            indefinitely. Milus Banister, MD 12/21/2018 2:45:19 PM This report has been signed electronically.

## 2018-12-21 NOTE — Patient Instructions (Addendum)
information on polyps given to you today  Await pathology on polyps removed  Resume previous diet and previous medications     Continue taking Iron supplement daily   YOU HAD AN ENDOSCOPIC PROCEDURE TODAY AT [facility]:   Refer to the procedure report that was given to you for any specific questions about what was found during the examination.  If the procedure report does not answer your questions, please call your gastroenterologist to clarify.  If you requested that your care partner not be given the details of your procedure findings, then the procedure report has been included in a sealed envelope for you to review at your convenience later.  YOU SHOULD EXPECT: Some feelings of bloating in the abdomen. Passage of more gas than usual.  Walking can help get rid of the air that was put into your GI tract during the procedure and reduce the bloating. If you had a lower endoscopy (such as a colonoscopy or flexible sigmoidoscopy) you may notice spotting of blood in your stool or on the toilet paper. If you underwent a bowel prep for your procedure, you may not have a normal bowel movement for a few days.  Please Note:  You might notice some irritation and congestion in your nose or some drainage.  This is from the oxygen used during your procedure.  There is no need for concern and it should clear up in a day or so.  SYMPTOMS TO REPORT IMMEDIATELY:   Following lower endoscopy (colonoscopy or flexible sigmoidoscopy):  Excessive amounts of blood in the stool  Significant tenderness or worsening of abdominal pains  Swelling of the abdomen that is new, acute  Fever of 100F or higher   Following upper endoscopy (EGD)  Vomiting of blood or coffee ground material  New chest pain or pain under the shoulder blades  Painful or persistently difficult swallowing  New shortness of breath  Fever of 100F or higher  Black, tarry-looking stools  For urgent or emergent issues,  a gastroenterologist can be reached at any hour by calling [phone].   DIET:  We do recommend a small meal at first, but then you may proceed to your regular diet.  Drink plenty of fluids but you should avoid alcoholic beverages for 24 hours.  ACTIVITY:  You should plan to take it easy for the rest of today and you should NOT DRIVE or use heavy machinery until tomorrow (because of the sedation medicines used during the test).    FOLLOW UP: Our staff will call the number listed on your records 48-72 hours following your procedure to check on you and address any questions or concerns that you may have regarding the information given to you following your procedure. If we do not reach you, we will leave a message.  We will attempt to reach you two times.  During this call, we will ask if you have developed any symptoms of COVID 19. If you develop any symptoms (for example fever, flu-like symptoms, shortness of breath, cough etc.) before then, please call [phone].  If any biopsies were taken you will be contacted by phone or by letter within the next 1-3 weeks.  Please call us at [phone] if you have not heard about the biopsies in 3 weeks.    SIGNATURES/CONFIDENTIALITY: You and/or your care partner have signed paperwork which will be entered into your electronic medical record.  These signatures attest to the fact that that the information above on  your After Visit Summary has been reviewed and is understood.  Full responsibility of the confidentiality of this discharge information lies with you and/or your care-partner. 

## 2018-12-21 NOTE — Op Note (Signed)
[facility] Patient Name: [name] Procedure Date: 12/21/2018 2:12 PM MRN: [id number] Endoscopist: Milus Banister , MD Age: 63 Referring MD:  Date of Birth: [birth year] Gender: Female Account #: [id number] Procedure:                Colonoscopy Indications:              Iron deficiency anemia Medicines:                Monitored Anesthesia Care Procedure:                Pre-Anesthesia Assessment:                           - Prior to the procedure, a History and Physical                            was performed, and patient medications and                            allergies were reviewed. The patient's tolerance of                            previous anesthesia was also reviewed. The risks                            and benefits of the procedure and the sedation                            options and risks were discussed with the patient.                            All questions were answered, and informed consent                            was obtained. Prior Anticoagulants: The patient has                            taken no previous anticoagulant or antiplatelet                            agents. ASA Grade Assessment: II - A patient with                            mild systemic disease. After reviewing the risks                            and benefits, the patient was deemed in                            satisfactory condition to undergo the procedure.                           After obtaining informed consent, the colonoscope  was passed under direct vision. Throughout the                            procedure, the patient's blood pressure, pulse, and                            oxygen saturations were monitored continuously. The                            Colonoscope was introduced through the anus and                            advanced to the the cecum, identified by                            appendiceal orifice and ileocecal valve. The                            colonoscopy was performed without difficulty. The                            patient tolerated the procedure well. The quality                            of the bowel preparation was good. The ileocecal                            valve, appendiceal orifice, and rectum were                            photographed. Scope In: 2:16:47 PM Scope Out: 2:33:26 PM Scope Withdrawal Time: 0 hours 13 minutes 2 seconds  Total Procedure Duration: 0 hours 16 minutes 39 seconds  Findings:                 Two sessile polyps were found in the ascending                            colon. The polyps were 10 to 11 mm in size. These                            polyps were removed with a hot snare. Resection and                            retrieval were complete. jar 1                           A 8 mm polyp was found in the descending colon. The                            polyp was sessile. The polyp was removed with a                            cold snare. Resection  and retrieval were complete.                            jar 1                           A 16 mm polyp was found in the rectum. The polyp                            was semi-pedunculated. The polyp was removed with a                            hot snare. Resection and retrieval were complete.                            jar 2                           The exam was otherwise without abnormality on                            direct and retroflexion views. Complications:            No immediate complications. Estimated blood loss:                            None. Estimated Blood Loss:     Estimated blood loss: none. Impression:               - Two 10 to 11 mm polyps in the ascending colon,                            removed with a hot snare. Resected and retrieved.                           - One 8 mm polyp in the descending colon, removed                            with a cold snare. Resected and retrieved.                            - One 16 mm polyp in the rectum, removed with a hot                            snare. Resected and retrieved.                           - The examination was otherwise normal on direct                            and retroflexion views. Recommendation:           - Patient has a contact number available for  emergencies. The signs and symptoms of potential                            delayed complications were discussed with the                            patient. Return to normal activities tomorrow.                            Written discharge instructions were provided to the                            patient.                           - Resume previous diet.                           - Continue present medications.                           You will receive a letter within 2-3 weeks with the                            pathology results and my final recommendations.                           If the polyp(s) is proven to be 'pre-cancerous' on                            pathology, you will need repeat colonoscopy in 3                            years. Milus Banister, MD 12/21/2018 2:36:12 PM This report has been signed electronically.

## 2018-12-21 NOTE — Progress Notes (Signed)
covid screen per June, temp per Bethena Roys

## 2018-12-21 NOTE — Progress Notes (Signed)
Called to room to assist during endoscopic procedure.  Patient ID and intended procedure confirmed with present staff. Received instructions for my participation in the procedure from the performing physician.  

## 2018-12-23 ENCOUNTER — Telehealth: Payer: Self-pay

## 2018-12-23 NOTE — Telephone Encounter (Signed)
  Follow up Call-  Call back number 12/21/2018 08/29/2016  Post procedure Call Back phone  # [phone]  Permission to leave phone message Yes Yes  Some recent data might be hidden     Patient questions:  Do you have a fever, pain , or abdominal swelling? No. Pain Score  0 *  Have you tolerated food without any problems? Yes.    Have you been able to return to your normal activities? Yes.    Do you have any questions about your discharge instructions: Diet   No. Medications  No. Follow up visit  No.  Do you have questions or concerns about your Care? No.  Actions: * If pain score is 4 or above: No action needed, pain <4.   1. Have you developed a fever since your procedure? No.  2.   Have you had an respiratory symptoms (SOB or cough) since your procedure? No.  3.   Have you tested positive for COVID 19 since your procedure No.  3.   Have you had any family members/close contacts diagnosed with the COVID 19 since your procedure?  No   If any of these questions are a yes, please inquire if patient has been seen by family doctor and route this note to Joylene John, Therapist, sports.

## 2018-12-30 ENCOUNTER — Telehealth: Payer: Self-pay | Admitting: Gastroenterology

## 2018-12-30 ENCOUNTER — Other Ambulatory Visit: Payer: Self-pay | Admitting: Gastroenterology

## 2018-12-30 DIAGNOSIS — K621 Rectal polyp: Secondary | ICD-10-CM

## 2018-12-31 NOTE — Telephone Encounter (Signed)
Instructions sent to pt via mychart.

## 2019-01-02 ENCOUNTER — Telehealth: Payer: Self-pay | Admitting: *Deleted

## 2019-01-02 NOTE — Telephone Encounter (Addendum)
Covid-19 screening questions  Have you traveled in the last 14 days? If yes where?  No   Do you now or have you had a fever in the last 14 days? No   Do you have any respiratory symptoms of shortness of breath or cough now or in the last 14 days? No  Do you have any family members or close contacts with diagnosed or suspected Covid-19 in the past 14 days? No   Have you been tested for Covid-19 and found to be positive? No        

## 2019-01-04 ENCOUNTER — Encounter: Payer: Self-pay | Admitting: Gastroenterology

## 2019-01-04 ENCOUNTER — Other Ambulatory Visit: Payer: Self-pay

## 2019-01-04 ENCOUNTER — Telehealth: Payer: Self-pay | Admitting: *Deleted

## 2019-01-04 ENCOUNTER — Telehealth: Payer: Self-pay | Admitting: Gastroenterology

## 2019-01-04 ENCOUNTER — Ambulatory Visit (AMBULATORY_SURGERY_CENTER): Payer: 59 | Admitting: Gastroenterology

## 2019-01-04 VITALS — BP 113/64 | HR 65 | Temp 98.5°F | Resp 18 | Ht 65.0 in | Wt 262.0 lb

## 2019-01-04 DIAGNOSIS — K621 Rectal polyp: Secondary | ICD-10-CM

## 2019-01-04 DIAGNOSIS — Z86010 Personal history of colonic polyps: Secondary | ICD-10-CM

## 2019-01-04 DIAGNOSIS — I10 Essential (primary) hypertension: Secondary | ICD-10-CM | POA: Diagnosis not present

## 2019-01-04 DIAGNOSIS — G4733 Obstructive sleep apnea (adult) (pediatric): Secondary | ICD-10-CM | POA: Diagnosis not present

## 2019-01-04 DIAGNOSIS — I509 Heart failure, unspecified: Secondary | ICD-10-CM | POA: Diagnosis not present

## 2019-01-04 MED ORDER — SODIUM CHLORIDE 0.9 % IV SOLN: 500.0000 mL | Freq: Once | INTRAVENOUS | Status: DC

## 2019-01-04 NOTE — Telephone Encounter (Signed)
Called patient back she was concerned that her "prep " didn't work. She is here at the office for her appt this morning. Just told her to come in and we will discuss with Dr. Ardis Hughs. Shes here for FLex sig at 11:00. SM

## 2019-01-04 NOTE — Patient Instructions (Signed)
Handout given for polyps.  Await pathology results.  YOU HAD AN ENDOSCOPIC PROCEDURE TODAY AT [facility]:   Refer to the procedure report that was given to you for any specific questions about what was found during the examination.  If the procedure report does not answer your questions, please call your gastroenterologist to clarify.  If you requested that your care partner not be given the details of your procedure findings, then the procedure report has been included in a sealed envelope for you to review at your convenience later.  YOU SHOULD EXPECT: Some feelings of bloating in the abdomen. Passage of more gas than usual.  Walking can help get rid of the air that was put into your GI tract during the procedure and reduce the bloating. If you had a lower endoscopy (such as a colonoscopy or flexible sigmoidoscopy) you may notice spotting of blood in your stool or on the toilet paper. If you underwent a bowel prep for your procedure, you may not have a normal bowel movement for a few days.  Please Note:  You might notice some irritation and congestion in your nose or some drainage.  This is from the oxygen used during your procedure.  There is no need for concern and it should clear up in a day or so.  SYMPTOMS TO REPORT IMMEDIATELY:   Following lower endoscopy (colonoscopy or flexible sigmoidoscopy):  Excessive amounts of blood in the stool  Significant tenderness or worsening of abdominal pains  Swelling of the abdomen that is new, acute  Fever of 100F or higher  For urgent or emergent issues, a gastroenterologist can be reached at any hour by calling [phone].   DIET:  We do recommend a small meal at first, but then you may proceed to your regular diet.  Drink plenty of fluids but you should avoid alcoholic beverages for 24 hours.  ACTIVITY:  You should plan to take it easy for the rest of today and you should NOT DRIVE or use heavy machinery until tomorrow  (because of the sedation medicines used during the test).    FOLLOW UP: Our staff will call the number listed on your records 48-72 hours following your procedure to check on you and address any questions or concerns that you may have regarding the information given to you following your procedure. If we do not reach you, we will leave a message.  We will attempt to reach you two times.  During this call, we will ask if you have developed any symptoms of COVID 19. If you develop any symptoms (ie: fever, flu-like symptoms, shortness of breath, cough etc.) before then, please call [phone].  If you test positive for Covid 19 in the 2 weeks post procedure, please call and report this information to [place].    If any biopsies were taken you will be contacted by phone or by letter within the next 1-3 weeks.  Please call us at [phone] if you have not heard about the biopsies in 3 weeks.    SIGNATURES/CONFIDENTIALITY: You and/or your care partner have signed paperwork which will be entered into your electronic medical record.  These signatures attest to the fact that that the information above on your After Visit Summary has been reviewed and is understood.  Full responsibility of the confidentiality of this discharge information lies with you and/or your care-partner.

## 2019-01-04 NOTE — Progress Notes (Deleted)
Covid screening and temp done by [facility]. Vital signs by Rica Mote.

## 2019-01-04 NOTE — Telephone Encounter (Signed)
Pt called in to speak with the nurse about the procd that is sched for today. She stated that the prep she took last night plus the enema that was taking this morning did not work. She is needing some advice.

## 2019-01-04 NOTE — Progress Notes (Signed)
Report given to PACU, vss 

## 2019-01-04 NOTE — Op Note (Signed)
[facility] Patient Name: [name] Procedure Date: 01/04/2019 10:44 AM MRN: [id number] Endoscopist: Milus Banister , MD Age: 63 Referring MD:  Date of Birth: February 08, 1956 Gender: Female Account #: [id number] Procedure:                Flexible Sigmoidoscopy Indications:              High risk colon cancer surveillance: Personal                            history of colonic polyps; 1.6cm semi pedunculated                            polyp removed from rectum 2 weeks ago with                            snare/cautery, fragmented sample showed TA with HGD                            at cautery margin Medicines:                Monitored Anesthesia Care Procedure:                Pre-Anesthesia Assessment:                           - Prior to the procedure, a History and Physical                            was performed, and patient medications and                            allergies were reviewed. The patient's tolerance of                            previous anesthesia was also reviewed. The risks                            and benefits of the procedure and the sedation                            options and risks were discussed with the patient.                            All questions were answered, and informed consent                            was obtained. Prior Anticoagulants: The patient has                            taken no previous anticoagulant or antiplatelet                            agents. ASA Grade Assessment: II - A patient with  mild systemic disease. After reviewing the risks                            and benefits, the patient was deemed in                            satisfactory condition to undergo the procedure.                           After obtaining informed consent, the scope was                            passed under direct vision. The Colonoscope was                            introduced through the anus and advanced to  the the                            sigmoid colon. The flexible sigmoidoscopy was                            accomplished without difficulty. The patient                            tolerated the procedure well. The quality of the                            bowel preparation was good. Scope In: Scope Out: Findings:                 The site of recent rectal polypectomy was easily                            found, located about 2cm from the anal verge. There                            was no obvious remaining polyp tissue however I                            used snare/cautery to remove the polyectomy site                            and then biopsied the edges of the site. Lastly I                            injected SPOT submucosally adjacent to the                            polypectomy site to aid in future localization. Complications:            No immediate complications. Estimated blood loss:                            None. Estimated Blood Loss:  none Impression:               - The site of recent rectal polypectomy was easily                            found, located about 2cm from the anal verge. There                            was no obvious remaining polyp tissue however I                            used snare/cautery to remove the polyectomy site                            and then biopsied the edges of the site. Lastly I                            injected SPOT submucosally adjacent to the                            polypectomy site to aid in future localization. Recommendation:           - Patient has a contact number available for                            emergencies. The signs and symptoms of potential                            delayed complications were discussed with the                            patient. Return to normal activities tomorrow.                            Written discharge instructions were provided to the                            patient.                            - Resume previous diet.                           - Await pathology results for final recommendations. Milus Banister, MD 01/04/2019 11:09:01 AM This report has been signed electronically.

## 2019-01-04 NOTE — Progress Notes (Signed)
Called to room to assist during endoscopic procedure.  Patient ID and intended procedure confirmed with present staff. Received instructions for my participation in the procedure from the performing physician.  

## 2019-01-05 MED FILL — PROPRANOLOL 60 MG TABLET: 60 | 90 days supply | Qty: 270 | Fill #1

## 2019-01-06 ENCOUNTER — Telehealth: Payer: Self-pay | Admitting: *Deleted

## 2019-01-06 NOTE — Telephone Encounter (Signed)
  Follow up Call-  Call back number 01/04/2019 12/21/2018 08/29/2016  Post procedure Call Back phone  # [phone]  Permission to leave phone message Yes Yes Yes  Some recent data might be hidden     Patient questions:  Do you have a fever, pain , or abdominal swelling? No. Pain Score  0 *  Have you tolerated food without any problems? Yes.    Have you been able to return to your normal activities? Yes.    Do you have any questions about your discharge instructions: Diet   No. Medications  No. Follow up visit  No.  Do you have questions or concerns about your Care? No.  Actions: * If pain score is 4 or above: No action needed, pain <4.  1. Have you developed a fever since your procedure? no  2.   Have you had an respiratory symptoms (SOB or cough) since your procedure? no  3.   Have you tested positive for COVID 19 since your procedure no  4.   Have you had any family members/close contacts diagnosed with the COVID 19 since your procedure?  no   If yes to any of these questions please route to Joylene John, RN and Alphonsa Gin, Therapist, sports.

## 2019-01-07 ENCOUNTER — Other Ambulatory Visit: Payer: Self-pay

## 2019-01-07 ENCOUNTER — Ambulatory Visit (INDEPENDENT_AMBULATORY_CARE_PROVIDER_SITE_OTHER): Payer: 59 | Admitting: Family Medicine

## 2019-01-07 VITALS — BP 102/76 | HR 73 | Temp 97.5°F | Resp 14 | Ht 65.0 in | Wt 260.0 lb

## 2019-01-07 DIAGNOSIS — M792 Neuralgia and neuritis, unspecified: Secondary | ICD-10-CM | POA: Diagnosis not present

## 2019-01-07 DIAGNOSIS — I10 Essential (primary) hypertension: Secondary | ICD-10-CM

## 2019-01-07 DIAGNOSIS — M62838 Other muscle spasm: Secondary | ICD-10-CM

## 2019-01-07 DIAGNOSIS — K219 Gastro-esophageal reflux disease without esophagitis: Secondary | ICD-10-CM | POA: Diagnosis not present

## 2019-01-07 DIAGNOSIS — R05 Cough: Secondary | ICD-10-CM

## 2019-01-07 DIAGNOSIS — E785 Hyperlipidemia, unspecified: Secondary | ICD-10-CM | POA: Diagnosis not present

## 2019-01-07 DIAGNOSIS — J309 Allergic rhinitis, unspecified: Secondary | ICD-10-CM | POA: Diagnosis not present

## 2019-01-07 DIAGNOSIS — R110 Nausea: Secondary | ICD-10-CM | POA: Diagnosis not present

## 2019-01-07 DIAGNOSIS — R7303 Prediabetes: Secondary | ICD-10-CM

## 2019-01-07 DIAGNOSIS — F329 Major depressive disorder, single episode, unspecified: Secondary | ICD-10-CM

## 2019-01-07 DIAGNOSIS — M797 Fibromyalgia: Secondary | ICD-10-CM

## 2019-01-07 DIAGNOSIS — R059 Cough, unspecified: Secondary | ICD-10-CM

## 2019-01-07 DIAGNOSIS — G43909 Migraine, unspecified, not intractable, without status migrainosus: Secondary | ICD-10-CM

## 2019-01-07 DIAGNOSIS — F32A Depression, unspecified: Secondary | ICD-10-CM

## 2019-01-07 MED ORDER — GABAPENTIN 300 MG PO CAPS: 300.0000 mg | ORAL_CAPSULE | Freq: Four times a day (QID) | ORAL | 1 refills | Status: DC

## 2019-01-07 MED ORDER — BENZONATATE 100 MG PO CAPS: 100.0000 mg | ORAL_CAPSULE | Freq: Two times a day (BID) | ORAL | 1 refills | Status: DC | PRN

## 2019-01-07 MED ORDER — FAMOTIDINE 20 MG PO TABS: 20.0000 mg | ORAL_TABLET | Freq: Every day | ORAL | 1 refills | Status: DC

## 2019-01-07 MED ORDER — SERTRALINE HCL 50 MG PO TABS: 50.0000 mg | ORAL_TABLET | Freq: Every day | ORAL | 1 refills | Status: DC

## 2019-01-07 MED ORDER — ONDANSETRON 4 MG PO TBDP: ORAL_TABLET | ORAL | 4 refills | Status: DC

## 2019-01-07 MED ORDER — FLUTICASONE PROPIONATE 50 MCG/ACT NA SUSP: 1.0000 | Freq: Every day | NASAL | 6 refills | Status: DC

## 2019-01-07 MED ORDER — LISINOPRIL 20 MG PO TABS: 20.0000 mg | ORAL_TABLET | Freq: Every day | ORAL | 1 refills | Status: DC

## 2019-01-07 MED ORDER — DEXLANSOPRAZOLE 60 MG PO CPDR: 60.0000 mg | DELAYED_RELEASE_CAPSULE | Freq: Every day | ORAL | 1 refills | Status: DC

## 2019-01-07 MED ORDER — ATORVASTATIN CALCIUM 10 MG PO TABS: 10.0000 mg | ORAL_TABLET | Freq: Every day | ORAL | 1 refills | Status: DC

## 2019-01-07 MED ORDER — DULOXETINE HCL 60 MG PO CPEP: 60.0000 mg | ORAL_CAPSULE | Freq: Every day | ORAL | 1 refills | Status: DC

## 2019-01-07 MED ORDER — TOPIRAMATE 50 MG PO TABS: 50.0000 mg | ORAL_TABLET | Freq: Two times a day (BID) | ORAL | 1 refills | Status: DC

## 2019-01-07 MED ORDER — CYCLOBENZAPRINE HCL 10 MG PO TABS: ORAL_TABLET | ORAL | 1 refills | Status: DC

## 2019-01-07 MED FILL — SERTRALINE HCL 50 MG TABLET: 50 | 90 days supply | Qty: 90 | Fill #0

## 2019-01-07 MED FILL — GABAPENTIN 300 MG CAPSULE: 300 | 68 days supply | Qty: 540 | Fill #0

## 2019-01-07 MED FILL — DULoxetine HCL 60 MG CPEP: 60 | 90 days supply | Qty: 90 | Fill #0

## 2019-01-07 MED FILL — DEXILANT DR 60 MG CAPSULE: 60 | 90 days supply | Qty: 90 | Fill #0

## 2019-01-07 MED FILL — ONDANSETRON ODT 4 MG TABLET: 4 | 7 days supply | Qty: 20 | Fill #0

## 2019-01-07 MED FILL — BENZONATATE 100 MG CAPS: 100 | 10 days supply | Qty: 20 | Fill #0

## 2019-01-07 MED FILL — FLUTICASONE PROP 50 MCG SPR: 50 | 60 days supply | Qty: 16 | Fill #0

## 2019-01-07 NOTE — Patient Instructions (Addendum)
  Try Flonase nasal spray for possible allergy symptoms, that may decrease the need for Tessalon Perles but I did refill dose temporarily.  No other change in medication doses at this time.  Please follow-up with Dr. Pamella Pert in August as lab work will be needed at that time.  Let me know if there are questions in the meantime.  I would discuss nausea further with Dr. Pamella Pert at follow-up as well as gastroenterology as some of that could be related to medication or underlying gastrointestinal issues.  Return to the clinic or go to the nearest emergency room if any of your symptoms worsen or new symptoms occur.    If you have lab work done today you will be contacted with your lab results within the next 2 weeks.  If you have not heard from [place] then please contact us. The fastest way to get your results is to register for My Chart.   IF you received an x-ray today, you will receive an invoice from [facility]. Please contact [facility] at [phone] with questions or concerns regarding your invoice.   IF you received labwork today, you will receive an invoice from [facility]. Please contact [facility] at [phone] with questions or concerns regarding your invoice.   Our billing staff will not be able to assist you with questions regarding bills from these companies.  You will be contacted with the lab results as soon as they are available. The fastest way to get your results is to activate your My Chart account. Instructions are located on the last page of this paperwork. If you have not heard from [place] regarding the results in 2 weeks, please contact this office.

## 2019-01-07 NOTE — Progress Notes (Signed)
Subjective:    Patient ID: [name], female    DOB: 05/25/1956, 63 y.o.   MRN: [id number]  HPI [name] is a 63 y.o. female Presents today for: Chief Complaint  Patient presents with  . chronic medical conditions    Patient need refill on all 11 meds listed below.   PCP is Rutherford Guys, MD  Next appt 10/13.   GERD:Iron deficiency anemia. Lab Results  Component Value Date   IRON 66 11/24/2018   TIBC 274 11/24/2018   FERRITIN 67 11/24/2018   Lab Results  Component Value Date   WBC 4.3 11/24/2018   HGB 12.7 11/24/2018   HCT 40.6 11/24/2018   MCV 85.3 11/24/2018   PLT 181 11/24/2018  Discussed April 10 with Dr. Pamella Pert.  History of IV iron transfusion.  Followed by gastroenterology, but delayed in visit due to COVID-19.  Follow-up appointment May 5 with gastroenterology with telemedicine visit.  Recommended repeat colonoscopy as previous was 9 years ago to evaluate for iron deficiency anemia, history of large hiatal hernia.  Also recommended endoscopy given history of stomach ulcer in the past and concern for return.  Additionally to rule out neoplasm of the stomach or esophagus. Takes Dexilant 60 mg daily, Pepcid 20 mg daily, needs refills zofran for nausea as needed - comes and goes. Up to daily, sometimes skips a day.    Hypertension: BP Readings from Last 3 Encounters:  01/07/19 102/76  01/04/19 113/64  12/21/18 (!) 98/57   Lab Results  Component Value Date   CREATININE 0.97 09/17/2018  Lisinopril 20 mg daily at suppertime - no further dizziness.   No new side effects. Cardiology - Dr. Ellyn Hack   Hyperlipidemia:  Lab Results  Component Value Date   CHOL 163 09/17/2018   HDL 63 09/17/2018   LDLCALC 72 09/17/2018   TRIG 142 09/17/2018   CHOLHDL 2.6 09/17/2018   Lab Results  Component Value Date   ALT 11 09/17/2018   AST 18 09/17/2018   ALKPHOS 85 09/17/2018   BILITOT <0.2 09/17/2018  Lipitor 10mg  QD, no new myalgias or side effects.    Fibromyalgia/chronic pain syndrome: Takes Cymbalta 60 mg daily, gabapentin 300 mg 4 times daily - currently 4 times per day.  Hip and back, sometimes in knees and thighs. nerve damage in R elbow form fall in 2015. Had nausea prior to Cymbalta.  cymbalta helps with depression  Depression: zoloft and cymbalta control symptoms.  Has been doing some activities for enjoyment.  Depression screen [facility] 2/9 01/07/2019 11/12/2018 10/12/2018 10/08/2018 09/17/2018  Decreased Interest 0 0 0 0 3  Down, Depressed, Hopeless 0 1 0 0 3  PHQ - 2 Score 0 1 0 0 6  Altered sleeping - - - - 3  Tired, decreased energy - - - - 1  Change in appetite - - - - 0  Feeling bad or failure about yourself  - - - - 1  Trouble concentrating - - - - 0  Moving slowly or fidgety/restless - - - - 0  Suicidal thoughts - - - - 0  PHQ-9 Score - - - - 11  Difficult doing work/chores - - - - -  Some recent data might be hidden    Migraine headaches: Takes Topamax 50 mg twice daily. No new side effects.  Migraine few weeks ago. Occur about once per month or less.   Neck pain/spasms - R side since 2015.  Takes flexeril BID - morning and  night, slight sedation. No falls.   Cough: Episodic rhinorrhea, congestion. Not on allergy meds currently. Coughing spells needing tessalon few times per week.   Prediabetes: Lab Results  Component Value Date   HGBA1C 6.0 (H) 09/17/2018   Wt Readings from Last 3 Encounters:  01/07/19 260 lb (117.9 kg)  01/04/19 262 lb (118.8 kg)  12/21/18 262 lb (118.8 kg)     Review of Systems Per HPI.     Objective:   Physical Exam Vitals signs reviewed.  Constitutional:      Appearance: She is well-developed.  HENT:     Head: Normocephalic and atraumatic.  Eyes:     Conjunctiva/sclera: Conjunctivae normal.     Pupils: Pupils are equal, round, and reactive to light.  Neck:     Vascular: No carotid bruit.  Cardiovascular:     Rate and Rhythm: Normal rate and regular rhythm.     Heart  sounds: Normal heart sounds.  Pulmonary:     Effort: Pulmonary effort is normal.     Breath sounds: Normal breath sounds.  Abdominal:     Palpations: Abdomen is soft. There is no pulsatile mass.     Tenderness: There is no abdominal tenderness.  Musculoskeletal:       Back:     Right lower leg: Edema (trace bilaterally. ) present.     Left lower leg: Edema present.  Skin:    General: Skin is warm and dry.  Neurological:     Mental Status: She is alert and oriented to person, place, and time.  Psychiatric:        Behavior: Behavior normal.    Vitals:   01/07/19 1531  BP: 102/76  Pulse: 73  Resp: 14  Temp: (!) 97.5 F (36.4 C)  TempSrc: Oral  SpO2: 97%  Weight: 260 lb (117.9 kg)  Height: 5\' 5"  (1.651 m)       Assessment & Plan:   [name] is a 63 y.o. female Prediabetes - Plan: Microalbumin/Creatinine Ratio, Urine, Hemoglobin A1c  -Check A1c, urine microalbumin/creatinine ratio  Cough - Plan: benzonatate (TESSALON) 100 MG capsule Allergic rhinitis, unspecified seasonality, unspecified trigger - Plan: benzonatate (TESSALON) 100 MG capsule, fluticasone (FLONASE) 50 MCG/ACT nasal spray  -Intermittent cough may be due to laryngeal pharyngeal reflux versus allergic rhinitis.  Currently on antireflux meds, trial of Flonase for possible postnasal drip with allergies.  Tessalon refilled temporarily.  RTC precautions if persistent/worsening  Fibromyalgia - Plan: DULoxetine (CYMBALTA) 60 MG capsule, gabapentin (NEURONTIN) 300 MG capsule  -Reports stable symptoms on current regimen.  Refilled gabapentin same dose, Cymbalta same dose.  Plan for follow-up with primary care provider next few months  Depression, unspecified depression type - Plan: DULoxetine (CYMBALTA) 60 MG capsule, sertraline (ZOLOFT) 50 MG tablet  -Reports stable symptoms.  Continue Cymbalta, Zoloft same dose.  Neck muscle spasm - Plan: cyclobenzaprine (FLEXERIL) 10 MG tablet  -Flexeril refilled same dose  for now, can discuss further with follow-up appointment with primary care provider  Essential hypertension - Plan: lisinopril (ZESTRIL) 20 MG tablet, Comprehensive metabolic panel, TSH  -Stable, continue lisinopril same dose, check labs  Gastroesophageal reflux disease, esophagitis presence not specified - Plan: dexlansoprazole (DEXILANT) 60 MG capsule, famotidine (PEPCID) 20 MG tablet  -Continue Dexilant, Pepcid, routine follow-up with gastroenterology.  Nerve pain - Plan: gabapentin (NEURONTIN) 300 MG capsule  -As above continue gabapentin same dose.  Discuss further at follow-up with PCP  Hyperlipidemia, unspecified hyperlipidemia type - Plan: atorvastatin (LIPITOR) 10 MG tablet,  Lipid Panel  -Tolerating statin, continue same dose of Lipitor, labs pending  Nausea without vomiting - Plan: ondansetron (ZOFRAN-ODT) 4 MG disintegrating tablet  -Recommend discussion with PCP at follow-up, as well as gastroenterology.  Could be related to Cymbalta, but stable from mild symptoms, will avoid dose changes for now.  Migraine without status migrainosus, not intractable, unspecified migraine type - Plan: topiramate (TOPAMAX) 50 MG tablet  -Reports overall stable control with use of Topamax, no changes.   Meds ordered this encounter  Medications  . atorvastatin (LIPITOR) 10 MG tablet    Sig: Take 1 tablet (10 mg total) by mouth daily at 6 PM.    Dispense:  90 tablet    Refill:  1  . benzonatate (TESSALON) 100 MG capsule    Sig: Take 1 capsule (100 mg total) by mouth 2 (two) times daily as needed for cough.    Dispense:  20 capsule    Refill:  1  . cyclobenzaprine (FLEXERIL) 10 MG tablet    Sig: TAKE 1/2-1 TABLET BY MOUTH 3 TIMES DAILY AS NEEDED FOR MUSCLE SPASMS.    Dispense:  180 tablet    Refill:  1  . dexlansoprazole (DEXILANT) 60 MG capsule    Sig: Take 1 capsule (60 mg total) by mouth daily.    Dispense:  90 capsule    Refill:  1  . DULoxetine (CYMBALTA) 60 MG capsule    Sig: Take  1 capsule (60 mg total) by mouth daily.    Dispense:  90 capsule    Refill:  1  . famotidine (PEPCID) 20 MG tablet    Sig: Take 1 tablet (20 mg total) by mouth at bedtime.    Dispense:  90 tablet    Refill:  1  . gabapentin (NEURONTIN) 300 MG capsule    Sig: Take 1-2 capsules (300-600 mg total) by mouth 4 (four) times daily.    Dispense:  540 capsule    Refill:  1  . lisinopril (ZESTRIL) 20 MG tablet    Sig: Take 1 tablet (20 mg total) by mouth daily.    Dispense:  90 tablet    Refill:  1  . ondansetron (ZOFRAN-ODT) 4 MG disintegrating tablet    Sig: DISSOLVE 1 TABLET BY MOUTH EVERY 8 HOURS AS NEEDED FOR NAUSEA OR VOMITING.    Dispense:  20 tablet    Refill:  4  . sertraline (ZOLOFT) 50 MG tablet    Sig: Take 1 tablet (50 mg total) by mouth daily.    Dispense:  90 tablet    Refill:  1  . topiramate (TOPAMAX) 50 MG tablet    Sig: Take 1 tablet (50 mg total) by mouth 2 (two) times daily.    Dispense:  180 tablet    Refill:  1  . fluticasone (FLONASE) 50 MCG/ACT nasal spray    Sig: Place 1 spray into both nostrils daily.    Dispense:  16 g    Refill:  6   Patient Instructions    Try Flonase nasal spray for possible allergy symptoms, that may decrease the need for Tessalon Perles but I did refill dose temporarily.  No other change in medication doses at this time.  Please follow-up with Dr. Pamella Pert in August as lab work will be needed at that time.  Let me know if there are questions in the meantime.  I would discuss nausea further with Dr. Pamella Pert at follow-up as well as gastroenterology as some of that could be related  to medication or underlying gastrointestinal issues.  Return to the clinic or go to the nearest emergency room if any of your symptoms worsen or new symptoms occur.    If you have lab work done today you will be contacted with your lab results within the next 2 weeks.  If you have not heard from [place] then please contact us. The fastest way to get your results  is to register for My Chart.   IF you received an x-ray today, you will receive an invoice from [facility]. Please contact [facility] at [phone] with questions or concerns regarding your invoice.   IF you received labwork today, you will receive an invoice from [facility]. Please contact [facility] at [phone] with questions or concerns regarding your invoice.   Our billing staff will not be able to assist you with questions regarding bills from these companies.  You will be contacted with the lab results as soon as they are available. The fastest way to get your results is to activate your My Chart account. Instructions are located on the last page of this paperwork. If you have not heard from [place] regarding the results in 2 weeks, please contact this office.       Signed,   Merri Ray, MD [facility] [place].  01/08/19 5:42 PM

## 2019-01-08 ENCOUNTER — Encounter: Payer: Self-pay | Admitting: Family Medicine

## 2019-01-10 ENCOUNTER — Ambulatory Visit: Payer: 59 | Admitting: Family Medicine

## 2019-01-10 MED FILL — SM ACID REDUCER 20 MG TAB: 20 | 25 days supply | Qty: 25 | Fill #0

## 2019-01-10 MED FILL — LISINOPRIL 20 MG TABLET: 20 | 90 days supply | Qty: 90 | Fill #0

## 2019-01-10 MED FILL — ATORVASTATIN 10 MG TABLET: 10 | 90 days supply | Qty: 90 | Fill #0

## 2019-01-31 MED FILL — CYCLOBENZAPRINE HCL 10 MG T: 10 | 60 days supply | Qty: 180 | Fill #0

## 2019-02-14 ENCOUNTER — Inpatient Hospital Stay: Payer: 59

## 2019-02-16 ENCOUNTER — Other Ambulatory Visit: Payer: Self-pay

## 2019-02-16 ENCOUNTER — Inpatient Hospital Stay: Payer: 59 | Attending: Oncology

## 2019-02-16 DIAGNOSIS — D509 Iron deficiency anemia, unspecified: Secondary | ICD-10-CM | POA: Insufficient documentation

## 2019-02-16 DIAGNOSIS — D508 Other iron deficiency anemias: Secondary | ICD-10-CM

## 2019-02-16 LAB — CBC WITH DIFFERENTIAL/PLATELET
Abs Immature Granulocytes: 0.01 10*3/uL (ref 0.00–0.07)
Basophils Absolute: 0 10*3/uL (ref 0.0–0.1)
Basophils Relative: 0 %
Eosinophils Absolute: 0.1 10*3/uL (ref 0.0–0.5)
Eosinophils Relative: 2 %
HCT: 40.2 % (ref 36.0–46.0)
Hemoglobin: 12.8 g/dL (ref 12.0–15.0)
Immature Granulocytes: 0 %
Lymphocytes Relative: 27 %
Lymphs Abs: 1.4 10*3/uL (ref 0.7–4.0)
MCH: 29.2 pg (ref 26.0–34.0)
MCHC: 31.8 g/dL (ref 30.0–36.0)
MCV: 91.8 fL (ref 80.0–100.0)
Monocytes Absolute: 0.4 10*3/uL (ref 0.1–1.0)
Monocytes Relative: 7 %
Neutro Abs: 3.2 10*3/uL (ref 1.7–7.7)
Neutrophils Relative %: 64 %
Platelets: 206 10*3/uL (ref 150–400)
RBC: 4.38 MIL/uL (ref 3.87–5.11)
RDW: 14.2 % (ref 11.5–15.5)
WBC: 5.1 10*3/uL (ref 4.0–10.5)
nRBC: 0 % (ref 0.0–0.2)

## 2019-02-16 LAB — RETICULOCYTES
Immature Retic Fract: 12.2 % (ref 2.3–15.9)
RBC.: 4.4 MIL/uL (ref 3.87–5.11)
Retic Count, Absolute: 65.6 10*3/uL (ref 19.0–186.0)
Retic Ct Pct: 1.5 % (ref 0.4–3.1)

## 2019-02-17 ENCOUNTER — Other Ambulatory Visit: Payer: Self-pay | Admitting: Oncology

## 2019-02-17 LAB — FERRITIN: Ferritin: 12 ng/mL (ref 11–307)

## 2019-02-17 LAB — IRON AND TIBC
Iron: 71 ug/dL (ref 41–142)
Saturation Ratios: 21 % (ref 21–57)
TIBC: 335 ug/dL (ref 236–444)
UIBC: 264 ug/dL (ref 120–384)

## 2019-02-18 ENCOUNTER — Other Ambulatory Visit: Payer: Self-pay | Admitting: Gastroenterology

## 2019-02-18 ENCOUNTER — Telehealth: Payer: Self-pay | Admitting: *Deleted

## 2019-02-18 ENCOUNTER — Telehealth: Payer: Self-pay | Admitting: Oncology

## 2019-02-18 DIAGNOSIS — D509 Iron deficiency anemia, unspecified: Secondary | ICD-10-CM

## 2019-02-18 NOTE — Telephone Encounter (Signed)
Spoke to patient. She will come in towards the later part of next week for a cbc

## 2019-02-18 NOTE — Telephone Encounter (Signed)
Scheduled appt per 7/17 sch message - pt aware of appt date and time

## 2019-02-18 NOTE — Telephone Encounter (Signed)
Her blood counts two days ago were normal.   She needs repeat CBC.  Not sure she is actually having any bleeding.

## 2019-02-18 NOTE — Telephone Encounter (Signed)
Scheduled appt per 7/17 sch message - pt to get an updated schedule in treatment area - per RN Molly to print out schedule.

## 2019-02-18 NOTE — Telephone Encounter (Signed)
This RN contacted pt per MD review of recent iron studies showing need for IV iron replacement.  Per discussion- [name] states she has noticed her stools are " darker then usual " and she has been having " stomach pains " " I have been kinda feeling more tired and and a little dizzy - just not quite myself "  This RN reviewed lab - and concern that she could be loosing iron per GI issues. Discussed need to repeat IV iron. Of note [name] states she has been taking oral iron.  Discussed current symptoms and her need to contact her GI doctor to inform.  Note presently pt's heme is greater then 12.  [name] verbalized understanding.  No further needs at this time per this office.  LOS sent to schedule IV iron.  This note sent to covering provider as well as to pt's GI MD.

## 2019-02-23 ENCOUNTER — Other Ambulatory Visit: Payer: Self-pay | Admitting: Hematology

## 2019-02-25 ENCOUNTER — Other Ambulatory Visit: Payer: Self-pay

## 2019-02-25 ENCOUNTER — Telehealth: Payer: Self-pay | Admitting: Cardiology

## 2019-02-25 ENCOUNTER — Inpatient Hospital Stay: Payer: 59

## 2019-02-25 VITALS — BP 117/68 | HR 61 | Temp 98.9°F | Resp 16

## 2019-02-25 DIAGNOSIS — D509 Iron deficiency anemia, unspecified: Secondary | ICD-10-CM | POA: Diagnosis not present

## 2019-02-25 DIAGNOSIS — D508 Other iron deficiency anemias: Secondary | ICD-10-CM

## 2019-02-25 MED ORDER — SODIUM CHLORIDE 0.9 % IV SOLN
INTRAVENOUS | Status: DC
  Administered 2019-02-25: 14:00:00 via INTRAVENOUS
  Filled 2019-02-25 (×2): qty 250

## 2019-02-25 MED ORDER — SODIUM CHLORIDE 0.9 % IV SOLN
510.0000 mg | Freq: Once | INTRAVENOUS | Status: AC
  Administered 2019-02-25: 510 mg via INTRAVENOUS
  Filled 2019-02-25: qty 510

## 2019-02-25 NOTE — Telephone Encounter (Signed)

## 2019-02-25 NOTE — Patient Instructions (Signed)

## 2019-02-28 ENCOUNTER — Ambulatory Visit: Payer: 59 | Admitting: Cardiology

## 2019-03-03 ENCOUNTER — Other Ambulatory Visit: Payer: Self-pay | Admitting: Oncology

## 2019-03-04 ENCOUNTER — Other Ambulatory Visit: Payer: Self-pay

## 2019-03-04 ENCOUNTER — Inpatient Hospital Stay: Payer: 59

## 2019-03-04 VITALS — BP 99/66 | HR 58 | Temp 97.7°F | Resp 18

## 2019-03-04 DIAGNOSIS — D508 Other iron deficiency anemias: Secondary | ICD-10-CM

## 2019-03-04 DIAGNOSIS — D509 Iron deficiency anemia, unspecified: Secondary | ICD-10-CM | POA: Diagnosis not present

## 2019-03-04 MED ORDER — SODIUM CHLORIDE 0.9 % IV SOLN
510.0000 mg | Freq: Once | INTRAVENOUS | Status: AC
  Administered 2019-03-04: 510 mg via INTRAVENOUS
  Filled 2019-03-04: qty 510

## 2019-03-04 MED ORDER — SODIUM CHLORIDE 0.9 % IV SOLN
Freq: Once | INTRAVENOUS | Status: AC
  Administered 2019-03-04: 16:00:00 via INTRAVENOUS
  Filled 2019-03-04: qty 250

## 2019-03-04 NOTE — Patient Instructions (Signed)

## 2019-03-09 ENCOUNTER — Ambulatory Visit (INDEPENDENT_AMBULATORY_CARE_PROVIDER_SITE_OTHER): Payer: 59 | Admitting: Family Medicine

## 2019-03-09 ENCOUNTER — Other Ambulatory Visit: Payer: Self-pay

## 2019-03-09 DIAGNOSIS — I10 Essential (primary) hypertension: Secondary | ICD-10-CM

## 2019-03-09 DIAGNOSIS — R7303 Prediabetes: Secondary | ICD-10-CM | POA: Diagnosis not present

## 2019-03-09 DIAGNOSIS — E785 Hyperlipidemia, unspecified: Secondary | ICD-10-CM | POA: Diagnosis not present

## 2019-03-09 DIAGNOSIS — D509 Iron deficiency anemia, unspecified: Secondary | ICD-10-CM

## 2019-03-10 LAB — LIPID PANEL
Chol/HDL Ratio: 2.7 ratio (ref 0.0–4.4)
Cholesterol, Total: 163 mg/dL (ref 100–199)
HDL: 60 mg/dL (ref >39)
LDL Calculated: 73 mg/dL (ref 0–99)
Triglycerides: 151 mg/dL — ABNORMAL HIGH (ref 0–149)
VLDL Cholesterol Cal: 30 mg/dL (ref 5–40)

## 2019-03-10 LAB — COMPREHENSIVE METABOLIC PANEL
ALT: 14 IU/L (ref 0–32)
AST: 22 IU/L (ref 0–40)
Albumin/Globulin Ratio: 2.1 (ref 1.2–2.2)
Albumin: 4.5 g/dL (ref 3.8–4.8)
Alkaline Phosphatase: 75 IU/L (ref 39–117)
BUN/Creatinine Ratio: 11 — ABNORMAL LOW (ref 12–28)
BUN: 12 mg/dL (ref 8–27)
Bilirubin Total: 0.2 mg/dL (ref 0.0–1.2)
CO2: 21 mmol/L (ref 20–29)
Calcium: 9.4 mg/dL (ref 8.7–10.3)
Chloride: 107 mmol/L — ABNORMAL HIGH (ref 96–106)
Creatinine, Ser: 1.11 mg/dL — ABNORMAL HIGH (ref 0.57–1.00)
GFR calc Af Amer: 61 mL/min/{1.73_m2} (ref >59)
GFR calc non Af Amer: 53 mL/min/{1.73_m2} — ABNORMAL LOW (ref >59)
Globulin, Total: 2.1 g/dL (ref 1.5–4.5)
Glucose: 90 mg/dL (ref 65–99)
Potassium: 4 mmol/L (ref 3.5–5.2)
Sodium: 142 mmol/L (ref 134–144)
Total Protein: 6.6 g/dL (ref 6.0–8.5)

## 2019-03-10 LAB — HEMOGLOBIN A1C
Est. average glucose Bld gHb Est-mCnc: 114 mg/dL
Hgb A1c MFr Bld: 5.6 % (ref 4.8–5.6)

## 2019-03-10 LAB — TSH: TSH: 2.67 u[IU]/mL (ref 0.450–4.500)

## 2019-03-10 LAB — MICROALBUMIN / CREATININE URINE RATIO
Creatinine, Urine: 161.4 mg/dL
Microalb/Creat Ratio: 8 mg/g creat (ref 0–29)
Microalbumin, Urine: 12.2 ug/mL

## 2019-03-14 ENCOUNTER — Other Ambulatory Visit: Payer: Self-pay

## 2019-03-14 ENCOUNTER — Encounter: Payer: Self-pay | Admitting: Family Medicine

## 2019-03-14 ENCOUNTER — Ambulatory Visit (INDEPENDENT_AMBULATORY_CARE_PROVIDER_SITE_OTHER): Payer: 59 | Admitting: Family Medicine

## 2019-03-14 VITALS — BP 114/65 | HR 72 | Temp 98.2°F | Ht 65.0 in | Wt 258.0 lb

## 2019-03-14 DIAGNOSIS — G43009 Migraine without aura, not intractable, without status migrainosus: Secondary | ICD-10-CM

## 2019-03-14 DIAGNOSIS — E785 Hyperlipidemia, unspecified: Secondary | ICD-10-CM | POA: Diagnosis not present

## 2019-03-14 DIAGNOSIS — D500 Iron deficiency anemia secondary to blood loss (chronic): Secondary | ICD-10-CM | POA: Diagnosis not present

## 2019-03-14 DIAGNOSIS — R7303 Prediabetes: Secondary | ICD-10-CM | POA: Diagnosis not present

## 2019-03-14 DIAGNOSIS — L989 Disorder of the skin and subcutaneous tissue, unspecified: Secondary | ICD-10-CM | POA: Diagnosis not present

## 2019-03-14 DIAGNOSIS — I10 Essential (primary) hypertension: Secondary | ICD-10-CM | POA: Diagnosis not present

## 2019-03-14 MED ORDER — BUTALBITAL-APAP-CAFFEINE 50-325-40 MG PO TABS: 1.0000 | ORAL_TABLET | Freq: Four times a day (QID) | ORAL | 0 refills | Status: AC | PRN

## 2019-03-14 MED FILL — BUTALB-ACETAMIN-CAFF 50-325: 50-325-40 | 5 days supply | Qty: 40 | Fill #0

## 2019-03-14 NOTE — Patient Instructions (Signed)
° ° ° °  If you have lab work done today you will be contacted with your lab results within the next 2 weeks.  If you have not heard from us then please contact us. The fastest way to get your results is to register for My Chart. ° ° °IF you received an x-ray today, you will receive an invoice from [facility]. Please contact [facility] at [phone] with questions or concerns regarding your invoice.  ° °IF you received labwork today, you will receive an invoice from [facility]. Please contact [facility] at [phone] with questions or concerns regarding your invoice.  ° °Our billing staff will not be able to assist you with questions regarding bills from these companies. ° °You will be contacted with the lab results as soon as they are available. The fastest way to get your results is to activate your My Chart account. Instructions are located on the last page of this paperwork. If you have not heard from us regarding the results in 2 weeks, please contact this office. °  ° ° ° °

## 2019-03-14 NOTE — Progress Notes (Signed)
8/10/20201:53 PM  [name] 03-06-1956, 63 y.o., female [id number]  Chief Complaint  Patient presents with  . Follow-up    wants to talk about lab work, was told that she is pre dm, Had iron infusions done 7/24 and 7/31.    HPI:   Patient is a 63 y.o. female with past medical history significant for HTN, HLP, dCHF, OSA on cpap, morbid obesity, GERD, pre-diabetes who presents today for routine followup  Last OV April 2020 Referred to heme and GI  Had EGD and colonoscopy may 2020- cameron erosions - feso4 indefinitely, rectal polyp - high grade dysplasia I, repeat flex sig in 6 months, Dr Ardis Hughs  Has done 2 iron infusions since then, per heme Last infusion done in response to ferritin of 12 Has not resumed iron supplementation per heme's instructions Next labs in Oct  Requesting refill of fiorcet takes very prn for migraines Last rx in 2018  Has a non healing skin lesion on left check Has fhx skin cancer, might have been melanoma    Patient Care Team: Rutherford Guys, MD as PCP - General (Family Medicine) Leonie Man, MD as PCP - Cardiology (Cardiology) Magrinat, Virgie Dad, MD as Consulting Physician (Hematology and Oncology) Garner Nash, DO as Consulting Physician (Pulmonary Disease) Milus Banister, MD as Attending Physician (Gastroenterology)  CBC Latest Ref Rng & Units 02/16/2019 11/24/2018 11/10/2018  WBC 4.0 - 10.5 K/uL 5.1 4.3 4.8  Hemoglobin 12.0 - 15.0 g/dL 12.8 12.7 11.2  Hematocrit 36.0 - 46.0 % 40.2 40.6 37.2  Platelets 150 - 400 K/uL 206 181 260   Lab Results  Component Value Date   FERRITIN 12 02/16/2019    Lab Results  Component Value Date   HGBA1C 5.6 03/09/2019   Lab Results  Component Value Date   CHOL 163 03/09/2019   HDL 60 03/09/2019   LDLCALC 73 03/09/2019   TRIG 151 (H) 03/09/2019   CHOLHDL 2.7 03/09/2019   Lab Results  Component Value Date   CREATININE 1.11 (H) 03/09/2019   BUN 12 03/09/2019   NA 142 03/09/2019    K 4.0 03/09/2019   CL 107 (H) 03/09/2019   CO2 21 03/09/2019    Depression screen PHQ 2/9 03/14/2019 01/07/2019 11/12/2018  Decreased Interest 0 0 0  Down, Depressed, Hopeless 0 0 1  PHQ - 2 Score 0 0 1  Altered sleeping - - -  Tired, decreased energy - - -  Change in appetite - - -  Feeling bad or failure about yourself  - - -  Trouble concentrating - - -  Moving slowly or fidgety/restless - - -  Suicidal thoughts - - -  PHQ-9 Score - - -  Difficult doing work/chores - - -  Some recent data might be hidden    Fall Risk  03/14/2019 01/07/2019 11/12/2018 10/12/2018 10/08/2018  Falls in the past year? 0 0 0 0 0  Number falls in past yr: 0 0 0 0 0  Injury with Fall? 0 0 0 0 0  Comment - - - - -  Follow up - Falls evaluation completed - - -     Allergies  Allergen Reactions  . Sulfa Antibiotics Itching    Prior to Admission medications   Medication Sig Start Date End Date Taking? Authorizing Provider  Albuterol Sulfate (PROAIR RESPICLICK) 656 (90 Base) MCG/ACT AEPB Inhale 2 puffs into the lungs 4 (four) times daily as needed. 12/10/17  Yes Rutherford Guys, MD  atorvastatin (LIPITOR) 10 MG tablet Take 1 tablet (10 mg total) by mouth daily at 6 PM. 01/07/19  Yes Wendie Agreste, MD  benzonatate (TESSALON) 100 MG capsule Take 1 capsule (100 mg total) by mouth 2 (two) times daily as needed for cough. 01/07/19  Yes Wendie Agreste, MD  cyclobenzaprine (FLEXERIL) 10 MG tablet TAKE 1/2-1 TABLET BY MOUTH 3 TIMES DAILY AS NEEDED FOR MUSCLE SPASMS. 01/07/19  Yes Wendie Agreste, MD  dexlansoprazole (DEXILANT) 60 MG capsule Take 1 capsule (60 mg total) by mouth daily. 01/07/19  Yes Wendie Agreste, MD  docusate sodium (COLACE) 100 MG capsule TAKE 1 CAPSULE TWO TIMES DAILY 06/14/18  Yes Rutherford Guys, MD  DULoxetine (CYMBALTA) 60 MG capsule Take 1 capsule (60 mg total) by mouth daily. 01/07/19  Yes Wendie Agreste, MD  famotidine (PEPCID) 20 MG tablet Take 1 tablet (20 mg total) by mouth at  bedtime. 01/07/19  Yes Wendie Agreste, MD  fluticasone (FLONASE) 50 MCG/ACT nasal spray Place 1 spray into both nostrils daily. 01/07/19  Yes Wendie Agreste, MD  gabapentin (NEURONTIN) 300 MG capsule Take 1-2 capsules (300-600 mg total) by mouth 4 (four) times daily. 01/07/19  Yes Wendie Agreste, MD  lisinopril (ZESTRIL) 20 MG tablet Take 1 tablet (20 mg total) by mouth daily. 01/07/19  Yes Wendie Agreste, MD  ondansetron (ZOFRAN-ODT) 4 MG disintegrating tablet DISSOLVE 1 TABLET BY MOUTH EVERY 8 HOURS AS NEEDED FOR NAUSEA OR VOMITING. 01/07/19  Yes Wendie Agreste, MD  oxyCODONE-acetaminophen (PERCOCET/ROXICET) 5-325 MG tablet Take 1 tablet by mouth every 12 (twelve) hours as needed for severe pain. 06/14/18  Yes Rutherford Guys, MD  propranolol (INDERAL) 60 MG tablet Take 1 tablet (60 mg total) by mouth 3 (three) times daily. 09/29/18  Yes Lendon Colonel, NP  sertraline (ZOLOFT) 50 MG tablet Take 1 tablet (50 mg total) by mouth daily. 01/07/19  Yes Wendie Agreste, MD  topiramate (TOPAMAX) 50 MG tablet Take 1 tablet (50 mg total) by mouth 2 (two) times daily. 01/07/19  Yes Wendie Agreste, MD    Past Medical History:  Diagnosis Date  . Allergy    [name], Flonase  . Anemia   . Anxiety   . Arthritis   . Chronic kidney disease   . Colon polyps   . Depression   . Fibromyalgia   . Gastritis   . GERD (gastroesophageal reflux disease)   . Hernia, hiatal   . HLD (hyperlipidemia)   . Hypertension   . Migraine   . Neuropathy   . Osteoporosis   . Pneumonia   . Sleep apnea with use of continuous positive airway pressure (CPAP)   . Thyroid goiter     Past Surgical History:  Procedure Laterality Date  . ABDOMINAL HYSTERECTOMY     ovaries intact; DUB.  No dysplasia.  Marland Kitchen JOINT REPLACEMENT    . OPEN REDUCTION INTERNAL FIXATION (ORIF) DISTAL RADIAL FRACTURE Right 07/09/2017   Procedure: RIGHT WRIST OPEN REDUCTION INTERNAL FIXATION (ORIF) DISTAL RADIAL FRACTURE AND REPAIR AS INDICATED;   Surgeon: Iran Planas, MD;  Location: [facility];  Service: Orthopedics;  Laterality: Right;  . PARTIAL HYSTERECTOMY    . ROTATOR CUFF REPAIR Right 2009  . SHOULDER SURGERY Right    shoulder dislocation, fall, and elbow unla nerve  . SHOULDER SURGERY Right 2015   labral tear  . TUBAL LIGATION    . ULNAR NERVE REPAIR Right 08/19/2014    Social History  Tobacco Use  . Smoking status: Former Smoker    Quit date: 08/04/1998    Years since quitting: 20.6  . Smokeless tobacco: Never Used  Substance Use Topics  . Alcohol use: No    Family History  Problem Relation Age of Onset  . Heart disease Mother 37       AMI/CAD/CHF as cause of death  . Hypertension Mother   . Hyperlipidemia Mother   . Hypothyroidism Mother   . Depression Mother   . Gout Mother   . Heart disease Father 9       AMI  . Hypertension Father   . Stroke Father 22       mild CVA  . Prostate cancer Father   . Hyperlipidemia Father   . Cancer Father 74       prostate cancer  . Hypertension Brother   . Hyperlipidemia Brother   . Cancer Brother        prostate cancer  . Cancer Maternal Grandmother        type unknown  . Cancer Maternal Grandfather        type unknown  . Heart disease Paternal Grandmother   . Heart defect Paternal Grandfather   . Melanoma Brother   . Hyperlipidemia Brother   . Hypertension Brother   . Cancer Brother        melanoma  . Hypertension Brother   . Hyperlipidemia Brother   . Melanoma Brother   . Cancer Brother 25       melanoma  . Cancer Sister        Basal cell carcinoma scalp  . Depression Sister   . Hypertension Brother   . Depression Brother   . Hypertension Brother   . Gout Brother   . Arthritis Sister   . Depression Sister     Review of Systems  Constitutional: Negative for chills and fever.  Respiratory: Negative for cough and shortness of breath.   Cardiovascular: Negative for chest pain, palpitations and leg swelling.  Gastrointestinal: Positive for  abdominal pain and nausea. Negative for blood in stool, melena and vomiting.     OBJECTIVE:  Today's Vitals   03/14/19 1339  BP: 114/65  Pulse: 72  Temp: 98.2 F (36.8 C)  TempSrc: Oral  SpO2: 97%  Weight: 258 lb (117 kg)  Height: 5\' 5"  (1.651 m)   Body mass index is 42.93 kg/m.   Physical Exam Vitals signs and nursing note reviewed.  Constitutional:      Appearance: She is well-developed.  HENT:     Head: Normocephalic and atraumatic.  Eyes:     General: No scleral icterus.    Conjunctiva/sclera: Conjunctivae normal.     Pupils: Pupils are equal, round, and reactive to light.  Neck:     Musculoskeletal: Neck supple.  Pulmonary:     Effort: Pulmonary effort is normal.  Skin:    General: Skin is warm and dry.  Neurological:     Mental Status: She is alert and oriented to person, place, and time.     ASSESSMENT and PLAN  1. Iron deficiency anemia due to chronic blood loss Might be caused by cameron lesions. Followed by GI. Getting IV iron per heme. Continue to monitor. Cont with GERD prevention meds.   2. Migraine without aura and without status migrainosus, not intractable pmp reviewed. fioricet refilled.  3. Skin lesion of face - Ambulatory referral to Dermatology  4. Essential hypertension Controlled. Continue current regime.   5. Prediabetes  Normal a1c. Cont with LFM  6. Hyperlipidemia, unspecified hyperlipidemia type Minimally elevated fasting TG, cont with LFM  Other orders - butalbital-acetaminophen-caffeine (FIORICET) 50-325-40 MG tablet; Take 1-2 tablets by mouth every 6 (six) hours as needed for headache.  Return in about 3 months (around 06/14/2019).    Rutherford Guys, MD [facility] at [place], [place] [address] Ph.  [phone] Fax [phone]

## 2019-03-16 MED FILL — TOPIRAMATE 50 MG TABLET: 50 | 90 days supply | Qty: 180 | Fill #0

## 2019-03-16 MED FILL — CYCLOBENZAPRINE 10 MG TAB: 10 | 60 days supply | Qty: 180 | Fill #0

## 2019-03-17 ENCOUNTER — Ambulatory Visit: Payer: 59 | Admitting: Family Medicine

## 2019-04-13 MED FILL — SERTRALINE HCL 50 MG TABLET: 50 | 90 days supply | Qty: 90 | Fill #1

## 2019-04-13 MED FILL — GABAPENTIN 300 MG CAPSULE: 300 | 68 days supply | Qty: 540 | Fill #1

## 2019-04-13 MED FILL — LISINOPRIL 20 MG TABLET: 20 | 90 days supply | Qty: 90 | Fill #1

## 2019-04-13 MED FILL — ATORVASTATIN 10 MG TABLET: 10 | 90 days supply | Qty: 90 | Fill #1

## 2019-04-13 MED FILL — PROPRANOLOL 60 MG TABLET: 60 | 90 days supply | Qty: 270 | Fill #2

## 2019-04-13 MED FILL — ONDANSETRON ODT 4 MG TABLET: 4 | 7 days supply | Qty: 20 | Fill #1

## 2019-04-13 MED FILL — DEXILANT DR 60 MG CAPSULE: 60 | 90 days supply | Qty: 90 | Fill #1

## 2019-04-13 MED FILL — DULoxetine HCL 60 MG CPEP: 60 | 90 days supply | Qty: 90 | Fill #1

## 2019-04-13 MED FILL — FLUTICASONE PROP 50 MCG SPR: 50 | 60 days supply | Qty: 16 | Fill #1

## 2019-05-11 ENCOUNTER — Ambulatory Visit (INDEPENDENT_AMBULATORY_CARE_PROVIDER_SITE_OTHER): Payer: 59 | Admitting: Cardiology

## 2019-05-11 ENCOUNTER — Other Ambulatory Visit: Payer: Self-pay

## 2019-05-11 DIAGNOSIS — I5032 Chronic diastolic (congestive) heart failure: Secondary | ICD-10-CM

## 2019-05-11 DIAGNOSIS — R0609 Other forms of dyspnea: Secondary | ICD-10-CM

## 2019-05-11 DIAGNOSIS — Z01818 Encounter for other preprocedural examination: Secondary | ICD-10-CM | POA: Diagnosis not present

## 2019-05-11 DIAGNOSIS — Z6841 Body Mass Index (BMI) 40.0 and over, adult: Secondary | ICD-10-CM | POA: Diagnosis not present

## 2019-05-11 DIAGNOSIS — I10 Essential (primary) hypertension: Secondary | ICD-10-CM | POA: Diagnosis not present

## 2019-05-11 DIAGNOSIS — R0600 Dyspnea, unspecified: Secondary | ICD-10-CM | POA: Diagnosis not present

## 2019-05-11 DIAGNOSIS — Z23 Encounter for immunization: Secondary | ICD-10-CM | POA: Diagnosis not present

## 2019-05-11 DIAGNOSIS — E66813 Obesity, class 3: Secondary | ICD-10-CM

## 2019-05-11 MED ORDER — METOPROLOL TARTRATE 50 MG PO TABS: 100.0000 mg | ORAL_TABLET | Freq: Once | ORAL | 0 refills | Status: DC

## 2019-05-11 MED FILL — METOPROLOL TARTRATE 50 MG T: 50 | 1 days supply | Qty: 2 | Fill #0

## 2019-05-11 NOTE — Progress Notes (Signed)
PCP: Melissa Guys, MD  Clinic Note: Chief Complaint  Patient presents with  . Follow-up    Annual check  . Shortness of Breath    With exertion    HPI: [name] is a 63 y.o. female with a PMH below who presents today for 6 month f/u eval for DOE - Echo results.  Last seen September 29, 2018 by Melissa Sims, NP -> complaints of exertional dyspnea.  2D echo ordered.  Recent Hospitalizations:   n/a  Studies Personally Reviewed - (if available, images/films reviewed: From Epic Chart or Care Everywhere)  Echo March 2020: EF 60 to 65%.  No R WMA.  Normal RV.  Mild biatrial enlargement.  Normal valves.   Interval History: [name] returns today for follow-up stating that she has been found to be anemic and has been getting iron infusions.  This has helped some of her dyspnea.  But she still has quite a lot of fatigue and and feels sluggish after doing any walking.  She really does not notice any chest discomfort per se but will feel that her heart rate goes up quite a bit when she starts exercising.  It does respond pretty well when she stops, but it seems like her heart rate goes faster than it usually would. She also has occasional episodes where she feels a fluttering pinprick type squeezing sensation in the center of her chest.  It does not seem to be associated with any particular activity.  It can occur at rest or with exertion.  But she is most concerned about though is that she had been gaining ground with her exertional dyspnea, but now has regressed to where she is a lot worse than she had been last year.  She recently went on a trip with her family and friends to the mountains and was barely able to do anything besides just get out of the car.  She could not do any of the little hikes.  Just walking from the car to the go see different things made her short of breath.  She does not really noticed any PND or orthopnea symptoms.  She does note that her legs can start to  swell up near the end of the day but when she elevates her legs they get better.  She had an episode about a week or so ago where she really had a lot of swelling and over the course of the week she did a lot of elevating her feet and it has gone down to about 95% normal. Interestingly, with the swelling she did not really note any worsening PND or orthopnea type symptoms.  She still may have some intermittent dizzy sensations and balance issues, but no syncope or near syncope.  No TIA or amaurosis fugax.   No claudication.  ROS: A brief ROS was performed. Review of Systems  Constitutional: Negative for chills, fever and malaise/fatigue (low Energy -- ? Anemic).  HENT: Negative for congestion and nosebleeds.   Eyes: Negative for blurred vision and double vision.  Respiratory: Positive for shortness of breath (DOE). Negative for cough and wheezing.   Cardiovascular: Positive for leg swelling (see HPI).  Gastrointestinal: Positive for heartburn (off & on - has Hiatal Hernia). Negative for abdominal pain, blood in stool, constipation, melena, nausea and vomiting.  Genitourinary: Negative for hematuria.  Musculoskeletal: Negative for falls (As noted above) and joint pain.  Neurological: Positive for dizziness (when walking - poor balance). Negative for focal weakness, weakness and  headaches (off & on - NOT MIGRAINES).  Psychiatric/Behavioral: Negative for depression and memory loss. The patient is not nervous/anxious and does not have insomnia.    The patient does not have symptoms concerning for COVID-19 infection (fever, chills, cough, or new shortness of breath).  The patient is practicing social distancing.   I have reviewed and (if needed) personally updated the patient's problem list, medications, allergies, past medical and surgical history, social and family history.   Past Medical History:  Diagnosis Date  . Allergy    [name], Flonase  . Anemia   . Anxiety   . Arthritis   .  Chronic kidney disease   . Colon polyps   . Depression   . Fibromyalgia   . Gastritis   . GERD (gastroesophageal reflux disease)   . Hernia, hiatal   . HLD (hyperlipidemia)   . Hypertension   . Migraine   . Neuropathy   . Osteoporosis   . Pneumonia   . Sleep apnea with use of continuous positive airway pressure (CPAP)   . Thyroid goiter     Past Surgical History:  Procedure Laterality Date  . 48-Hour Holter Monitor  06/2017   Mostly normal sinus rhythm.  Average heart rate 71 bpm.  Minimum 54 bpm.  Maximum sinus rate 114 bpm.  Rare PACs and PVCs.  No A. fib, atrial flutter, SVT or VT.  One brief run of 8 beat PAT.  Marland Kitchen ABDOMINAL HYSTERECTOMY     ovaries intact; DUB.  No dysplasia.  Marland Kitchen JOINT REPLACEMENT    . OPEN REDUCTION INTERNAL FIXATION (ORIF) DISTAL RADIAL FRACTURE Right 07/09/2017   Procedure: RIGHT WRIST OPEN REDUCTION INTERNAL FIXATION (ORIF) DISTAL RADIAL FRACTURE AND REPAIR AS INDICATED;  Surgeon: Iran Planas, MD;  Location: [facility];  Service: Orthopedics;  Laterality: Right;  . PARTIAL HYSTERECTOMY    . ROTATOR CUFF REPAIR Right 2009  . SHOULDER SURGERY Right    shoulder dislocation, fall, and elbow unla nerve  . SHOULDER SURGERY Right 2015   labral tear  . TRANSTHORACIC ECHOCARDIOGRAM  06/2017    EF 55-60%.  grade 1 diastolic dysfunction.  Otherwise normal  . TUBAL LIGATION    . ULNAR NERVE REPAIR Right 08/19/2014    48-hour monitor: November 21-23, 2018.  Mostly normal sinus rhythm: Average heart rate 71 bpm  Minimum heart rate 54 bpm (sinus bradycardia). Maximal heart rate 114 bpm (sinus tachycardia).  Only 1 PVC noted. Rare PACs noted (less than 1%, 94 total) -mostly singlets.  1 run of PAT (8 beats) otherwise no significant arrhythmias noted.  No atrial fibrillation, atrial flutter, SVT, VT  2D Echocardiogram June 24, 2017: EF 55-60%.  grade 1 diastolic dysfunction.  Otherwise normal  Current Meds  Medication Sig  . Albuterol Sulfate (PROAIR RESPICLICK)  123XX123 (90 Base) MCG/ACT AEPB Inhale 2 puffs into the lungs 4 (four) times daily as needed.  Marland Kitchen atorvastatin (LIPITOR) 10 MG tablet Take 1 tablet (10 mg total) by mouth daily at 6 PM.  . benzonatate (TESSALON) 100 MG capsule Take 1 capsule (100 mg total) by mouth 2 (two) times daily as needed for cough.  . butalbital-acetaminophen-caffeine (FIORICET) 50-325-40 MG tablet Take 1-2 tablets by mouth every 6 (six) hours as needed for headache.  . cyclobenzaprine (FLEXERIL) 10 MG tablet TAKE 1/2-1 TABLET BY MOUTH 3 TIMES DAILY AS NEEDED FOR MUSCLE SPASMS.  Marland Kitchen dexlansoprazole (DEXILANT) 60 MG capsule Take 1 capsule (60 mg total) by mouth daily.  Marland Kitchen docusate sodium (COLACE) 100 MG capsule  TAKE 1 CAPSULE TWO TIMES DAILY  . DULoxetine (CYMBALTA) 60 MG capsule Take 1 capsule (60 mg total) by mouth daily.  . famotidine (PEPCID) 20 MG tablet Take 1 tablet (20 mg total) by mouth at bedtime.  . fluticasone (FLONASE) 50 MCG/ACT nasal spray Place 1 spray into both nostrils daily.  Marland Kitchen gabapentin (NEURONTIN) 300 MG capsule Take 1-2 capsules (300-600 mg total) by mouth 4 (four) times daily.  Marland Kitchen lisinopril (ZESTRIL) 20 MG tablet Take 1 tablet (20 mg total) by mouth daily.  . ondansetron (ZOFRAN-ODT) 4 MG disintegrating tablet DISSOLVE 1 TABLET BY MOUTH EVERY 8 HOURS AS NEEDED FOR NAUSEA OR VOMITING.  Marland Kitchen oxyCODONE-acetaminophen (PERCOCET/ROXICET) 5-325 MG tablet Take 1 tablet by mouth every 12 (twelve) hours as needed for severe pain.  Marland Kitchen propranolol (INDERAL) 60 MG tablet Take 1 tablet (60 mg total) by mouth 3 (three) times daily.  . sertraline (ZOLOFT) 50 MG tablet Take 1 tablet (50 mg total) by mouth daily.  Marland Kitchen topiramate (TOPAMAX) 50 MG tablet Take 1 tablet (50 mg total) by mouth 2 (two) times daily.    Allergies  Allergen Reactions  . Sulfa Antibiotics Itching    Social History   Tobacco Use  . Smoking status: Former Smoker    Quit date: 08/04/1998    Years since quitting: 20.7  . Smokeless tobacco: Never Used   Substance Use Topics  . Alcohol use: No  . Drug use: No   Social History   Social History Narrative   Marital status: married x 20 years; moderately happy     Children: 3 children (47 [name], 25 Dianna, 40 April); 8 grandchildren; 0 gg     Lives: with husband/Steve, April, 2 granddaughters     Employment:  Unemployed; quit working 2015 with work related injury; awaiting disability in 2018; disability approved in 02/2017.       Tobacco: quit smoking in 2016; smoked x 2 years.      Alcohol: none      Drugs: none      Exercise: rarely in 2018          family history includes Arthritis in her sister; Cancer in her brother, brother, maternal grandfather, maternal grandmother, and sister; Cancer (age of onset: 94) in her brother; Cancer (age of onset: 11) in her father; Depression in her brother, mother, sister, and sister; Gout in her brother and mother; Heart defect in her paternal grandfather; Heart disease in her paternal grandmother; Heart disease (age of onset: 67) in her father; Heart disease (age of onset: 89) in her mother; Hyperlipidemia in her brother, brother, brother, father, and mother; Hypertension in her brother, brother, brother, brother, brother, father, and mother; Hypothyroidism in her mother; Melanoma in her brother and brother; Prostate cancer in her father; Stroke (age of onset: 60) in her father.  Wt Readings from Last 3 Encounters:  05/11/19 258 lb (117 kg)  03/14/19 258 lb (117 kg)  01/07/19 260 lb (117.9 kg)    PHYSICAL EXAM BP 126/81   Pulse 63   Ht 5\' 4"  (1.626 m)   Wt 258 lb (117 kg)   SpO2 96%   BMI 44.29 kg/m  Physical Exam  Constitutional: She is oriented to person, place, and time. She appears well-developed and well-nourished. No distress.  Morbidly obese. Well groomed  HENT:  Head: Normocephalic and atraumatic.  Neck: No JVD present. Carotid bruit is not present.  Cardiovascular: Normal rate, regular rhythm and normal pulses.  No extrasystoles are  present. PMI  is not displaced (Unable to palpate). Exam reveals distant heart sounds. Exam reveals no gallop and no friction rub.  Pulmonary/Chest: Effort normal. No respiratory distress. She has no wheezes. She has no rales.  Mild diffuse interstitial sounds. Otherwise clear  Musculoskeletal: Normal range of motion.        General: Edema (Trivial) present.  Neurological: She is alert and oriented to person, place, and time.  Psychiatric: She has a normal mood and affect. Her behavior is normal. Judgment and thought content normal.  Nursing note and vitals reviewed.    Adult ECG Report NSR, rate 63 bpm.  NS ST (nonspecific ST and T wave changes).  Stable EKG.  Other studies Reviewed: Additional studies/ records that were reviewed today include:  Recent Labs:   Lab Results  Component Value Date   CREATININE 1.11 (H) 03/09/2019   BUN 12 03/09/2019   NA 142 03/09/2019   K 4.0 03/09/2019   CL 107 (H) 03/09/2019   CO2 21 03/09/2019   Lab Results  Component Value Date   CHOL 163 03/09/2019   HDL 60 03/09/2019   LDLCALC 73 03/09/2019   TRIG 151 (H) 03/09/2019   CHOLHDL 2.7 03/09/2019     ASSESSMENT / PLAN: Problem List Items Addressed This Visit    Essential hypertension (Chronic)    Blood pressure well controlled on lisinopril and propranolol.      Relevant Orders   EKG 12-Lead (Completed)   Class 3 severe obesity due to excess calories with serious comorbidity and body mass index (BMI) of 40.0 to 44.9 in adult [facility]) (Chronic)    I really suspect this is playing a major role in her dyspnea.  Combination of obesity hypoventilation syndrome and deconditioning.  However we will try to exclude ischemic etiology.      Chronic diastolic heart failure (HCC) (Chronic)    She does have some mild biatrial enlargement by echo, but did not seem to have much in the way of significant diastolic dysfunction on her prior echo.  She had one episode of swelling.  May need to consider PRN  diuretic.      Relevant Orders   EKG 12-Lead (Completed)   CT CORONARY MORPH W/CTA COR W/SCORE W/CA W/CM &/OR WO/CM   CT CORONARY FRACTIONAL FLOW RESERVE DATA PREP   CT CORONARY FRACTIONAL FLOW RESERVE FLUID ANALYSIS   Basic metabolic panel   DOE (dyspnea on exertion)    Pretty significant exertional dyspnea.  I think that we have done an echocardiogram that was relatively normal.  At this point we just need to exclude ischemic etiology.  With her body habitus I suspect that the stress test would have a high likelihood of having false positives.  We will opt for coronary CALCIUM SCORE WITH CORONARY CT ANGIOGRAM and possible CT-FFR.  This gives [place] both anatomic and physiologic data.      Relevant Orders   CT CORONARY MORPH W/CTA COR W/SCORE W/CA W/CM &/OR WO/CM   CT CORONARY FRACTIONAL FLOW RESERVE DATA PREP   CT CORONARY FRACTIONAL FLOW RESERVE FLUID ANALYSIS   Basic metabolic panel   Obesity, morbid, BMI 40.0-49.9 ([place]) - Primary    Other Visit Diagnoses    Pre-op testing       Relevant Orders   Basic metabolic panel   Need for immunization against influenza       Relevant Orders   Flu Vaccine QUAD 36+ mos IM (Completed)      COVID-19 Education: The signs and symptoms of  COVID-19 were discussed with the patient and how to seek care for testing (follow up with PCP or arrange E-visit).   The importance of social distancing was discussed today.  Current medicines are reviewed at length with the patient today. (+/- concerns) n/a The following changes have been made: n/a  Patient Instructions  Medication Instructions:  SEE INSTRUCTION SHEET  FOR CORONARY CTA   If you need a refill on your cardiac medications before your next appointment, please call your pharmacy.   Lab work:  SEE Designer, jewellery FOR CORONARY CTA (CCTA)   Testing/Procedures: WILL BE SCHEDULE AT [facility] Your physician has requested that you have  cardiac CT. Cardiac computed tomography (CT) is a painless test that uses an x-ray machine to take clear, detailed pictures of your heart. For further information please visit HugeFiesta[facility][email or web address]. Please follow instruction sheet as given.    Follow-Up: At [facility], you and your health needs are our priority.  As part of our continuing mission to provide you with exceptional heart care, we have created designated Provider Care Teams.  These Care Teams include your primary Cardiologist (physician) and Advanced Practice Providers (APPs -  Physician Assistants and Nurse Practitioners) who all work together to provide you with the care you need, when you need it. . You will need a follow up appointment in   3  Months-Jan 2021.  Please call our office 2 months in advance to schedule this appointment.  You may see Glenetta Hew, MD or one of the following Advanced Practice Providers on your designated Care Team:   . Rosaria Ferries, PA-C . Melissa Sims, DNP, ANP  Any Other Special Instructions Will Be Listed Below (If Applicable).    Your cardiac CT will be scheduled at the below locations:   [facility] [address] [place], [place] [address] [phone]    If scheduled at [facility], please arrive at the [facility] main entrance of [facility] 30-45 minutes prior to test start time. Proceed to the [facility] Radiology Department (first floor) to check-in and test prep.    Please follow these instructions carefully (unless otherwise directed):  PLEASE HAVE LABWORK ( BMP)  DONE AT LEAST ONE WEEK PRIOR TO TEST.  On the Night Before the Test: . Be sure to Drink plenty of water. . Do not consume any caffeinated/decaffeinated beverages or chocolate 12 hours prior to your test. . Do not take any antihistamines 12 hours prior to your test.   On the Day of the Test: . Drink plenty of water. Do not drink any water within one hour of the test. . Do  not eat any food 4 hours prior to the test. . You may take your regular medications prior to the test.  . Take metoprolol  (Lopressor) 100 MG  two hours prior to test. . HOLD Furosemide/Hydrochlorothiazide morning of the test.   After the Test: . Drink plenty of water. . After receiving IV contrast, you may experience a mild flushed feeling. This is normal. . On occasion, you may experience a mild rash up to 24 hours after the test. This is not dangerous. If this occurs, you can take Benadryl 25 mg and increase your fluid intake. . If you experience trouble breathing, this can be serious. If it is severe call 911 IMMEDIATELY. If it is mild, please call our office.     Please contact the cardiac imaging nurse navigator should  you have any questions/concerns Marchia Bond, RN Navigator Cardiac Imaging [facility] [phone] Office  [phone] Cell      Studies Ordered:   Orders Placed This Encounter  Procedures  . CT CORONARY MORPH W/CTA COR W/SCORE W/CA W/CM &/OR WO/CM  . CT CORONARY FRACTIONAL FLOW RESERVE DATA PREP  . CT CORONARY FRACTIONAL FLOW RESERVE FLUID ANALYSIS  . Flu Vaccine QUAD 36+ mos IM  . Basic metabolic panel  . EKG 12-Lead      Glenetta Hew, M.D., M.S. Interventional Cardiologist   Pager # [phone] Phone # [phone] [address] [place], [place] [address]   Thank you for choosing Heartcare at [facility]!!

## 2019-05-11 NOTE — Patient Instructions (Addendum)
Medication Instructions:  SEE INSTRUCTION SHEET  FOR CORONARY CTA   If you need a refill on your cardiac medications before your next appointment, please call your pharmacy.   Lab work:  SEE Designer, jewellery FOR CORONARY CTA (CCTA)   Testing/Procedures: WILL BE SCHEDULE AT [facility] Your physician has requested that you have cardiac CT. Cardiac computed tomography (CT) is a painless test that uses an x-ray machine to take clear, detailed pictures of your heart. For further information please visit [email or web address]. Please follow instruction sheet as given.    Follow-Up: At [facility], you and your health needs are our priority.  As part of our continuing mission to provide you with exceptional heart care, we have created designated Provider Care Teams.  These Care Teams include your primary Cardiologist (physician) and Advanced Practice Providers (APPs -  Physician Assistants and Nurse Practitioners) who all work together to provide you with the care you need, when you need it. . You will need a follow up appointment in   3  Months-Jan 2021.  Please call our office 2 months in advance to schedule this appointment.  You may see Glenetta Hew, MD or one of the following Advanced Practice Providers on your designated Care Team:   . Rosaria Ferries, PA-C . Jory Sims, DNP, ANP  Any Other Special Instructions Will Be Listed Below (If Applicable).    Your cardiac CT will be scheduled at the below locations:   [facility] [address] [place], [place] [address] [phone]    If scheduled at [facility], please arrive at the [facility] main entrance of [facility] 30-45 minutes prior to test start time. Proceed to the [facility] Radiology Department (first floor) to check-in and test prep.    Please follow these instructions carefully (unless otherwise directed):  PLEASE HAVE LABWORK (  BMP)  DONE AT LEAST ONE WEEK PRIOR TO TEST.  On the Night Before the Test: . Be sure to Drink plenty of water. . Do not consume any caffeinated/decaffeinated beverages or chocolate 12 hours prior to your test. . Do not take any antihistamines 12 hours prior to your test.   On the Day of the Test: . Drink plenty of water. Do not drink any water within one hour of the test. . Do not eat any food 4 hours prior to the test. . You may take your regular medications prior to the test.  . Take metoprolol  (Lopressor) 100 MG  two hours prior to test. . HOLD Furosemide/Hydrochlorothiazide morning of the test.   After the Test: . Drink plenty of water. . After receiving IV contrast, you may experience a mild flushed feeling. This is normal. . On occasion, you may experience a mild rash up to 24 hours after the test. This is not dangerous. If this occurs, you can take Benadryl 25 mg and increase your fluid intake. . If you experience trouble breathing, this can be serious. If it is severe call 911 IMMEDIATELY. If it is mild, please call our office.     Please contact the cardiac imaging nurse navigator should you have any questions/concerns Marchia Bond, RN Navigator Cardiac Imaging [facility] [phone] Office  [phone] Cell

## 2019-05-12 ENCOUNTER — Encounter: Payer: Self-pay | Admitting: Cardiology

## 2019-05-12 NOTE — Assessment & Plan Note (Signed)
Pretty significant exertional dyspnea.  I think that we have done an echocardiogram that was relatively normal.  At this point we just need to exclude ischemic etiology.  With her body habitus I suspect that the stress test would have a high likelihood of having false positives.  We will opt for coronary CALCIUM SCORE WITH CORONARY CT ANGIOGRAM and possible CT-FFR.  This gives [place] both anatomic and physiologic data.

## 2019-05-12 NOTE — Assessment & Plan Note (Signed)
She does have some mild biatrial enlargement by echo, but did not seem to have much in the way of significant diastolic dysfunction on her prior echo.  She had one episode of swelling.  May need to consider PRN diuretic.

## 2019-05-12 NOTE — Assessment & Plan Note (Signed)
Blood pressure well controlled on lisinopril and propranolol.

## 2019-05-12 NOTE — Assessment & Plan Note (Signed)
I really suspect this is playing a major role in her dyspnea.  Combination of obesity hypoventilation syndrome and deconditioning.  However we will try to exclude ischemic etiology.

## 2019-05-13 ENCOUNTER — Ambulatory Visit: Payer: 59 | Admitting: Family Medicine

## 2019-05-17 ENCOUNTER — Inpatient Hospital Stay: Payer: 59 | Attending: Oncology

## 2019-05-17 ENCOUNTER — Other Ambulatory Visit: Payer: Self-pay

## 2019-05-17 DIAGNOSIS — D509 Iron deficiency anemia, unspecified: Secondary | ICD-10-CM | POA: Insufficient documentation

## 2019-05-17 DIAGNOSIS — D508 Other iron deficiency anemias: Secondary | ICD-10-CM

## 2019-05-17 LAB — CBC WITH DIFFERENTIAL/PLATELET
Abs Immature Granulocytes: 0.01 10*3/uL (ref 0.00–0.07)
Basophils Absolute: 0 10*3/uL (ref 0.0–0.1)
Basophils Relative: 0 %
Eosinophils Absolute: 0.1 10*3/uL (ref 0.0–0.5)
Eosinophils Relative: 2 %
HCT: 42.9 % (ref 36.0–46.0)
Hemoglobin: 14.2 g/dL (ref 12.0–15.0)
Immature Granulocytes: 0 %
Lymphocytes Relative: 27 %
Lymphs Abs: 1.6 10*3/uL (ref 0.7–4.0)
MCH: 30.7 pg (ref 26.0–34.0)
MCHC: 33.1 g/dL (ref 30.0–36.0)
MCV: 92.9 fL (ref 80.0–100.0)
Monocytes Absolute: 0.4 10*3/uL (ref 0.1–1.0)
Monocytes Relative: 6 %
Neutro Abs: 3.8 10*3/uL (ref 1.7–7.7)
Neutrophils Relative %: 65 %
Platelets: 193 10*3/uL (ref 150–400)
RBC: 4.62 MIL/uL (ref 3.87–5.11)
RDW: 12.3 % (ref 11.5–15.5)
WBC: 5.9 10*3/uL (ref 4.0–10.5)
nRBC: 0 % (ref 0.0–0.2)

## 2019-05-17 LAB — IRON AND TIBC
Iron: 80 ug/dL (ref 41–142)
Saturation Ratios: 33 % (ref 21–57)
TIBC: 243 ug/dL (ref 236–444)
UIBC: 162 ug/dL (ref 120–384)

## 2019-05-17 LAB — RETICULOCYTES
Immature Retic Fract: 10.3 % (ref 2.3–15.9)
RBC.: 4.57 MIL/uL (ref 3.87–5.11)
Retic Count, Absolute: 78.1 10*3/uL (ref 19.0–186.0)
Retic Ct Pct: 1.7 % (ref 0.4–3.1)

## 2019-05-17 LAB — FERRITIN: Ferritin: 94 ng/mL (ref 11–307)

## 2019-05-30 ENCOUNTER — Telehealth: Payer: Self-pay | Admitting: Cardiology

## 2019-05-30 NOTE — Telephone Encounter (Signed)
New Message:      Pt said when she had her last appt with Dr Ellyn Hack, he had discussed prescribing a fluid medicine. She wants to know if he descibed to do this, because she is still having swelling.

## 2019-05-31 DIAGNOSIS — R0600 Dyspnea, unspecified: Secondary | ICD-10-CM | POA: Diagnosis not present

## 2019-05-31 DIAGNOSIS — R0602 Shortness of breath: Secondary | ICD-10-CM | POA: Diagnosis not present

## 2019-05-31 DIAGNOSIS — I5032 Chronic diastolic (congestive) heart failure: Secondary | ICD-10-CM | POA: Diagnosis not present

## 2019-05-31 DIAGNOSIS — Z01818 Encounter for other preprocedural examination: Secondary | ICD-10-CM | POA: Diagnosis not present

## 2019-05-31 MED ORDER — FUROSEMIDE 20 MG PO TABS: 20.0000 mg | ORAL_TABLET | Freq: Two times a day (BID) | ORAL | 4 refills | Status: DC | PRN

## 2019-05-31 MED FILL — FUROSEMIDE 20 MG TABS: 20 | 68 days supply | Qty: 135 | Fill #0

## 2019-05-31 NOTE — Telephone Encounter (Signed)
Refilled prescription per MD request.

## 2019-05-31 NOTE — Telephone Encounter (Signed)
We talked about doing it but we never actually ordered it.  We can use as needed Lasix 20 mg tablets 1 to 2 tablets as needed for swelling.    Rx: Furosemide 20 mg, oral Sig: 1-2 tablets daily as needed for swelling Disp: #135 tab, 4 refill    Glenetta Hew c

## 2019-06-01 LAB — BASIC METABOLIC PANEL
BUN/Creatinine Ratio: 9 — ABNORMAL LOW (ref 12–28)
BUN: 8 mg/dL (ref 8–27)
CO2: 21 mmol/L (ref 20–29)
Calcium: 9.2 mg/dL (ref 8.7–10.3)
Chloride: 109 mmol/L — ABNORMAL HIGH (ref 96–106)
Creatinine, Ser: 0.93 mg/dL (ref 0.57–1.00)
GFR calc Af Amer: 76 mL/min/{1.73_m2} (ref >59)
GFR calc non Af Amer: 66 mL/min/{1.73_m2} (ref >59)
Glucose: 130 mg/dL — ABNORMAL HIGH (ref 65–99)
Potassium: 4.2 mmol/L (ref 3.5–5.2)
Sodium: 142 mmol/L (ref 134–144)

## 2019-06-02 MED FILL — METOPROLOL TARTRATE 50 MG T: 50 | 1 days supply | Qty: 2 | Fill #0

## 2019-06-03 ENCOUNTER — Telehealth: Payer: 59 | Admitting: Physician Assistant

## 2019-06-03 DIAGNOSIS — J069 Acute upper respiratory infection, unspecified: Secondary | ICD-10-CM | POA: Diagnosis not present

## 2019-06-03 MED ORDER — AZELASTINE HCL 0.1 % NA SOLN: 2.0000 | Freq: Two times a day (BID) | NASAL | 12 refills | Status: DC

## 2019-06-03 MED ORDER — BENZONATATE 100 MG PO CAPS: 100.0000 mg | ORAL_CAPSULE | Freq: Three times a day (TID) | ORAL | 0 refills | Status: DC | PRN

## 2019-06-03 NOTE — Progress Notes (Signed)
We are sorry you are not feeling well.  Here is how we plan to help!  Based on what you have shared with me, it looks like you may have a viral upper respiratory infection.  Upper respiratory infections are caused by a large number of viruses; however, rhinovirus is the most common cause.   Symptoms vary from person to person, with common symptoms including sore throat, cough, fatigue or lack of energy and feeling of general discomfort.  A low-grade fever of up to 100.4 may present, but is often uncommon.  Symptoms vary however, and are closely related to a person's age or underlying illnesses.  The most common symptoms associated with an upper respiratory infection are nasal discharge or congestion, cough, sneezing, headache and pressure in the ears and face.  These symptoms usually persist for about 3 to 10 days, but can last up to 2 weeks.  It is important to know that upper respiratory infections do not cause serious illness or complications in most cases.    Upper respiratory infections can be transmitted from person to person, with the most common method of transmission being a person's hands.  The virus is able to live on the skin and can infect other persons for up to 2 hours after direct contact.  Also, these can be transmitted when someone coughs or sneezes; thus, it is important to cover the mouth to reduce this risk.  To keep the spread of the illness at [place], good hand hygiene is very important.  This is an infection that is most likely caused by a virus. There are no specific treatments other than to help you with the symptoms until the infection runs its course.  We are sorry you are not feeling well.  Here is how we plan to help!   For nasal congestion, you may use an oral decongestants such as Mucinex D or if you have glaucoma or high blood pressure use plain Mucinex.  Saline nasal spray or nasal drops can help and can safely be used as often as needed for congestion.  For your congestion,  I have prescribed Azelastine nasal spray two sprays in each nostril twice a day  If you do not have a history of heart disease, hypertension, diabetes or thyroid disease, prostate/bladder issues or glaucoma, you may also use Sudafed to treat nasal congestion.  It is highly recommended that you consult with a pharmacist or your primary care physician to ensure this medication is safe for you to take.     If you have a cough, you may use cough suppressants such as Delsym and Robitussin.  If you have glaucoma or high blood pressure, you can also use Coricidin HBP.   For cough I have prescribed for you A prescription cough medication called Tessalon Perles 100 mg. You may take 1-2 capsules every 8 hours as needed for cough  If you have a sore or scratchy throat, use a saltwater gargle-  to  teaspoon of salt dissolved in a 4-ounce to 8-ounce glass of warm water.  Gargle the solution for approximately 15-30 seconds and then spit.  It is important not to swallow the solution.  You can also use throat lozenges/cough drops and Chloraseptic spray to help with throat pain or discomfort.  Warm or cold liquids can also be helpful in relieving throat pain.  For headache, pain or general discomfort, you can use Ibuprofen or Tylenol as directed.   Some authorities believe that zinc sprays or the use of  Echinacea may shorten the course of your symptoms.   HOME CARE . Only take medications as instructed by your medical team. . Be sure to drink plenty of fluids. Water is fine as well as fruit juices, sodas and electrolyte beverages. You may want to stay away from caffeine or alcohol. If you are nauseated, try taking small sips of liquids. How do you know if you are getting enough fluid? Your urine should be a pale yellow or almost colorless. . Get rest. . Taking a steamy shower or using a humidifier may help nasal congestion and ease sore throat pain. You can place a towel over your head and breathe in the steam  from hot water coming from a faucet. . Using a saline nasal spray works much the same way. . Cough drops, hard candies and sore throat lozenges may ease your cough. . Avoid close contacts especially the very young and the elderly . Cover your mouth if you cough or sneeze . Always remember to wash your hands.   GET HELP RIGHT AWAY IF: . You develop worsening fever. . If your symptoms do not improve within 10 days . You develop yellow or green discharge from your nose over 3 days. . You have coughing fits . You develop a severe head ache or visual changes. . You develop shortness of breath, difficulty breathing or start having chest pain . Your symptoms persist after you have completed your treatment plan  MAKE SURE YOU   Understand these instructions.  Will watch your condition.  Will get help right away if you are not doing well or get worse.  Your e-visit answers were reviewed by a board certified advanced clinical practitioner to complete your personal care plan. Depending upon the condition, your plan could have included both over the counter or prescription medications. Please review your pharmacy choice. If there is a problem, you may call our nursing hot line at and have the prescription routed to another pharmacy. Your safety is important to us. If you have drug allergies check your prescription carefully.   You can use MyChart to ask questions about today's visit, request a non-urgent call back, or ask for a work or school excuse for 24 hours related to this e-Visit. If it has been greater than 24 hours you will need to follow up with your provider, or enter a new e-Visit to address those concerns. You will get an e-mail in the next two days asking about your experience.  I hope that your e-visit has been valuable and will speed your recovery. Thank you for using e-visits.     Greater than 5 minutes, yet less than 10 minutes of time have been spent researching, coordinating  and implementing care for this patient today.   

## 2019-06-06 ENCOUNTER — Telehealth (HOSPITAL_COMMUNITY): Payer: Self-pay | Admitting: Emergency Medicine

## 2019-06-06 NOTE — Telephone Encounter (Signed)
Reaching out to patient to offer assistance regarding upcoming cardiac imaging study; pt verbalizes understanding of appt date/time, parking situation and where to check in, pre-test NPO status and medications ordered, and verified current allergies; name and call back number provided for further questions should they arise [place] Judithann Villamar RN Navigator Cardiac Imaging [facility] [phone] office [phone] cell 

## 2019-06-07 ENCOUNTER — Encounter (HOSPITAL_COMMUNITY): Payer: Self-pay

## 2019-06-07 ENCOUNTER — Other Ambulatory Visit: Payer: Self-pay

## 2019-06-07 ENCOUNTER — Ambulatory Visit (HOSPITAL_COMMUNITY)
Admission: RE | Admit: 2019-06-07 | Discharge: 2019-06-07 | Disposition: A | Discharge status: Home / Self Care | Payer: 59 | Source: Ambulatory Visit | Attending: Cardiology | Admitting: Cardiology

## 2019-06-07 DIAGNOSIS — I5032 Chronic diastolic (congestive) heart failure: Secondary | ICD-10-CM | POA: Diagnosis not present

## 2019-06-07 DIAGNOSIS — R0600 Dyspnea, unspecified: Secondary | ICD-10-CM | POA: Insufficient documentation

## 2019-06-07 DIAGNOSIS — R0609 Other forms of dyspnea: Secondary | ICD-10-CM

## 2019-06-07 MED ORDER — NITROGLYCERIN 0.4 MG SL SUBL
0.8000 mg | SUBLINGUAL_TABLET | Freq: Once | SUBLINGUAL | Status: AC
  Administered 2019-06-07: 15:00:00 0.8 mg via SUBLINGUAL

## 2019-06-07 MED ORDER — IOHEXOL 350 MG/ML SOLN
80.0000 mL | Freq: Once | INTRAVENOUS | Status: AC | PRN
  Administered 2019-06-07: 15:00:00 80 mL via INTRAVENOUS

## 2019-06-07 MED ORDER — NITROGLYCERIN 0.4 MG SL SUBL
SUBLINGUAL_TABLET | SUBLINGUAL | Status: AC
  Filled 2019-06-07: qty 1

## 2019-06-13 ENCOUNTER — Encounter: Payer: Self-pay | Admitting: Family Medicine

## 2019-06-13 ENCOUNTER — Ambulatory Visit (INDEPENDENT_AMBULATORY_CARE_PROVIDER_SITE_OTHER): Payer: 59 | Admitting: Family Medicine

## 2019-06-13 ENCOUNTER — Other Ambulatory Visit: Payer: Self-pay

## 2019-06-13 VITALS — BP 132/78 | HR 68 | Temp 98.2°F | Ht 64.0 in | Wt 259.0 lb

## 2019-06-13 DIAGNOSIS — R0600 Dyspnea, unspecified: Secondary | ICD-10-CM | POA: Diagnosis not present

## 2019-06-13 DIAGNOSIS — I272 Pulmonary hypertension, unspecified: Secondary | ICD-10-CM | POA: Diagnosis not present

## 2019-06-13 DIAGNOSIS — G4733 Obstructive sleep apnea (adult) (pediatric): Secondary | ICD-10-CM

## 2019-06-13 DIAGNOSIS — R0609 Other forms of dyspnea: Secondary | ICD-10-CM

## 2019-06-13 NOTE — Progress Notes (Signed)
11/9/20201:52 PM  [name] 15-Jan-1956, 63 y.o., female [id number]  Chief Complaint  Patient presents with  . medical conditions    3 m f.u     HPI:   Patient is a 63 y.o. female with past medical history significant for HTN, HLP, dCHF, OSA on cpap, morbid obesity, GERD, pre-diabeteswho presents today for routine followup  Last OV Aug 2020 - no changes Saw cards in oct 2020 - did CTA coronary for DOE - pHTN, large HH. Low calcium score  She reports that she has seen pulmonology, Dr Valeta Harms, 2019, normal PFTs Getting very SOB, sometimes cant finish sentences, [place] unable to go food shopping etc She uses cpap every night - does not use oxygen, she had cpap for 8 years, has not been checked since prescribed  Depression screen [facility] 2/9 06/13/2019 03/14/2019 01/07/2019  Decreased Interest 0 0 0  Down, Depressed, Hopeless 0 0 0  PHQ - 2 Score 0 0 0  Altered sleeping - - -  Tired, decreased energy - - -  Change in appetite - - -  Feeling bad or failure about yourself  - - -  Trouble concentrating - - -  Moving slowly or fidgety/restless - - -  Suicidal thoughts - - -  PHQ-9 Score - - -  Difficult doing work/chores - - -  Some recent data might be hidden    Fall Risk  06/13/2019 03/14/2019 01/07/2019 11/12/2018 10/12/2018  Falls in the past year? 0 0 0 0 0  Number falls in past yr: 0 0 0 0 0  Injury with Fall? 0 0 0 0 0  Comment - - - - -  Follow up Falls evaluation completed - Falls evaluation completed - -     Allergies  Allergen Reactions  . Sulfa Antibiotics Itching    Prior to Admission medications   Medication Sig Start Date End Date Taking? Authorizing Provider  Albuterol Sulfate (PROAIR RESPICLICK) [id number] (90 Base) MCG/ACT AEPB Inhale 2 puffs into the lungs 4 (four) times daily as needed. 12/10/17  Yes Rutherford Guys, MD  atorvastatin (LIPITOR) 10 MG tablet Take 1 tablet (10 mg total) by mouth daily at 6 PM. 01/07/19  Yes  Wendie Agreste, MD  azelastine (ASTELIN) 0.1 % nasal spray Place 2 sprays into both nostrils 2 (two) times daily. Use in each nostril as directed 06/03/19  Yes McVey, Gelene Mink, PA-C  butalbital-acetaminophen-caffeine (FIORICET) [phone] MG tablet Take 1-2 tablets by mouth every 6 (six) hours as needed for headache. 03/14/19 03/13/20 Yes Rutherford Guys, MD  cyclobenzaprine (FLEXERIL) 10 MG tablet TAKE 1/2-1 TABLET BY MOUTH 3 TIMES DAILY AS NEEDED FOR MUSCLE SPASMS. 01/07/19  Yes Wendie Agreste, MD  dexlansoprazole (DEXILANT) 60 MG capsule Take 1 capsule (60 mg total) by mouth daily. 01/07/19  Yes Wendie Agreste, MD  docusate sodium (COLACE) 100 MG capsule TAKE 1 CAPSULE TWO TIMES DAILY 06/14/18  Yes Rutherford Guys, MD  DULoxetine (CYMBALTA) 60 MG capsule Take 1 capsule (60 mg total) by mouth daily. 01/07/19  Yes Wendie Agreste, MD  fluticasone (FLONASE) 50 MCG/ACT nasal spray Place 1 spray into both nostrils daily. 01/07/19  Yes Wendie Agreste, MD  furosemide (LASIX) 20 MG tablet Take 1 tablet (20 mg total) by mouth 2 (two) times daily as needed. 05/31/19  Yes Leonie Man, MD  gabapentin (NEURONTIN) 300 MG capsule Take 1-2 capsules (300-600 mg total) by mouth  4 (four) times daily. 01/07/19  Yes Wendie Agreste, MD  lisinopril (ZESTRIL) 20 MG tablet Take 1 tablet (20 mg total) by mouth daily. 01/07/19  Yes Wendie Agreste, MD  ondansetron (ZOFRAN-ODT) 4 MG disintegrating tablet DISSOLVE 1 TABLET BY MOUTH EVERY 8 HOURS AS NEEDED FOR NAUSEA OR VOMITING. 01/07/19  Yes Wendie Agreste, MD  oxyCODONE-acetaminophen (PERCOCET/ROXICET) 5-325 MG tablet Take 1 tablet by mouth every 12 (twelve) hours as needed for severe pain. 06/14/18  Yes Rutherford Guys, MD  propranolol (INDERAL) 60 MG tablet Take 1 tablet (60 mg total) by mouth 3 (three) times daily. 09/29/18  Yes Lendon Colonel, NP  sertraline (ZOLOFT) 50 MG tablet Take 1 tablet (50 mg total) by mouth daily. 01/07/19  Yes Wendie Agreste, MD  topiramate (TOPAMAX) 50 MG tablet Take 1 tablet (50 mg total) by mouth 2 (two) times daily. 01/07/19  Yes Wendie Agreste, MD    Past Medical History:  Diagnosis Date  . Allergy    [name], Flonase  . Anemia   . Anxiety   . Arthritis   . Chronic kidney disease   . Colon polyps   . Depression   . Fibromyalgia   . Gastritis   . GERD (gastroesophageal reflux disease)   . Hernia, hiatal   . HLD (hyperlipidemia)   . Hypertension   . Migraine   . Neuropathy   . Osteoporosis   . Pneumonia   . Sleep apnea with use of continuous positive airway pressure (CPAP)   . Thyroid goiter     Past Surgical History:  Procedure Laterality Date  . 48-Hour Holter Monitor  06/2017   Mostly normal sinus rhythm.  Average heart rate 71 bpm.  Minimum 54 bpm.  Maximum sinus rate 114 bpm.  Rare PACs and PVCs.  No A. fib, atrial flutter, SVT or VT.  One brief run of 8 beat PAT.  Mar[facility] Kitchen ABDOMINAL HYSTERECTOMY     ovaries intact; DUB.  No dysplasia.  Marland Kitchen JOINT REPLACEMENT    . OPEN REDUCTION INTERNAL FIXATION (ORIF) DISTAL RADIAL FRACTURE Right 07/09/2017   Procedure: RIGHT WRIST OPEN REDUCTION INTERNAL FIXATION (ORIF) DISTAL RADIAL FRACTURE AND REPAIR AS INDICATED;  Surgeon: Iran Planas, MD;  Location: [place];  Service: Orthopedics;  Laterality: Right;  . PARTIAL HYSTERECTOMY    . ROTATOR CUFF REPAIR Right 2009  . SHOULDER SURGERY Right    shoulder dislocation, fall, and elbow unla nerve  . SHOULDER SURGERY Right 2015   labral tear  . TRANSTHORACIC ECHOCARDIOGRAM  06/2017    EF 55-60%.  grade 1 diastolic dysfunction.  Otherwise normal  . TUBAL LIGATION    . ULNAR NERVE REPAIR Right 08/19/2014    Social History   Tobacco Use  . Smoking status: Former Smoker    Quit date: 08/04/1998    Years since quitting: 20.8  . Smokeless tobacco: Never Used  Substance Use Topics  . Alcohol use: No    Family History  Problem Relation Age of Onset  . Heart disease Mother 78       AMI/CAD/CHF as  cause of death  . Hypertension Mother   . Hyperlipidemia Mother   . Hypothyroidism Mother   . Depression Mother   . Gout Mother   . Heart disease Father 75       AMI  . Hypertension Father   . Stroke Father 31       mild CVA  . Prostate cancer Father   . Hyperlipidemia  Father   . Cancer Father 37       prostate cancer  . Hypertension Brother   . Hyperlipidemia Brother   . Cancer Brother        prostate cancer  . Cancer Maternal Grandmother        type unknown  . Cancer Maternal Grandfather        type unknown  . Heart disease Paternal Grandmother   . Heart defect Paternal Grandfather   . Melanoma Brother   . Hyperlipidemia Brother   . Hypertension Brother   . Cancer Brother        melanoma  . Hypertension Brother   . Hyperlipidemia Brother   . Melanoma Brother   . Cancer Brother 25       melanoma  . Cancer Sister        Basal cell carcinoma scalp  . Depression Sister   . Hypertension Brother   . Depression Brother   . Hypertension Brother   . Gout Brother   . Arthritis Sister   . Depression Sister     Review of Systems  Constitutional: Negative for chills and fever.  Respiratory: Positive for shortness of breath. Negative for cough.   Cardiovascular: Positive for leg swelling (occasional). Negative for chest pain and palpitations.  Gastrointestinal: Negative for abdominal pain, nausea and vomiting.     OBJECTIVE:  Today's Vitals   06/13/19 1343  BP: 132/78  Pulse: 68  Temp: 98.2 F (36.8 C)  TempSrc: Oral  SpO2: 95%  Weight: 259 lb (117.5 kg)  Height: 5\' 4"  (1.626 m)   Body mass index is 44.46 kg/m.  6 minute walk: lowest 91% with HR 75 at 2 minutes, raised to max readings of  95% with HR 84 by 5 minutes  Physical Exam Vitals signs and nursing note reviewed.  Constitutional:      Appearance: She is well-developed.  HENT:     Head: Normocephalic and atraumatic.     Mouth/Throat:     Pharynx: No oropharyngeal exudate.  Eyes:     General:  No scleral icterus.    Conjunctiva/sclera: Conjunctivae normal.     Pupils: Pupils are equal, round, and reactive to light.  Neck:     Musculoskeletal: Neck supple.  Cardiovascular:     Rate and Rhythm: Normal rate and regular rhythm.     Heart sounds: Normal heart sounds. No murmur. No friction rub. No gallop.   Pulmonary:     Effort: Pulmonary effort is normal.     Breath sounds: Normal breath sounds. No wheezing or rales.  Musculoskeletal:     Right lower leg: No edema.     Left lower leg: No edema.  Skin:    General: Skin is warm and dry.  Neurological:     Mental Status: She is alert and oriented to person, place, and time.     No results found for this or any previous visit (from the past 24 hour(s)).  No results found.   ASSESSMENT and PLAN  1. Dyspnea on exertion - Pulse oximetry (single) - Ambulatory referral to Pulmonology  2. OSA (obstructive sleep apnea) - Ambulatory referral to Pulmonology  3. Pulmonary hypertension ([place]) - Ambulatory referral to Pulmonology  Discussed deconditioning again, normal 6 minute walk.  Given new signs of pHTN on cta and cpap that has never been checked, referring to pulm for further eval and treatment.   Return in about 3 months (around 09/13/2019).    Rutherford Guys, MD  Primary Care at [place], [place] [address] Ph.  [phone] Fax [phone]

## 2019-06-13 NOTE — Patient Instructions (Signed)
° ° ° °  If you have lab work done today you will be contacted with your lab results within the next 2 weeks.  If you have not heard from us then please contact us. The fastest way to get your results is to register for My Chart. ° ° °IF you received an x-ray today, you will receive an invoice from [facility]. Please contact [facility] at [phone] with questions or concerns regarding your invoice.  ° °IF you received labwork today, you will receive an invoice from [facility]. Please contact [facility] at [phone] with questions or concerns regarding your invoice.  ° °Our billing staff will not be able to assist you with questions regarding bills from these companies. ° °You will be contacted with the lab results as soon as they are available. The fastest way to get your results is to activate your My Chart account. Instructions are located on the last page of this paperwork. If you have not heard from us regarding the results in 2 weeks, please contact this office. °  ° ° ° °

## 2019-06-20 MED FILL — TOPIRAMATE 50 MG TABLET: 50 | 90 days supply | Qty: 180 | Fill #1

## 2019-06-20 MED FILL — CYCLOBENZAPRINE 10 MG TAB: 10 | 60 days supply | Qty: 180 | Fill #1

## 2019-06-20 MED FILL — ONDANSETRON ODT 4 MG TABLET: 4 | 7 days supply | Qty: 20 | Fill #2

## 2019-06-27 DIAGNOSIS — H524 Presbyopia: Secondary | ICD-10-CM | POA: Diagnosis not present

## 2019-06-29 ENCOUNTER — Encounter: Payer: Self-pay | Admitting: Gastroenterology

## 2019-07-05 ENCOUNTER — Telehealth: Payer: Self-pay | Admitting: Gastroenterology

## 2019-07-05 ENCOUNTER — Encounter: Payer: Self-pay | Admitting: Gastroenterology

## 2019-07-05 NOTE — Telephone Encounter (Signed)
It is fine to schedule her for Flex sig in the [place].  Thank you

## 2019-07-15 ENCOUNTER — Other Ambulatory Visit: Payer: Self-pay | Admitting: Family Medicine

## 2019-07-15 ENCOUNTER — Other Ambulatory Visit: Payer: Self-pay

## 2019-07-15 ENCOUNTER — Ambulatory Visit (AMBULATORY_SURGERY_CENTER): Payer: 59 | Admitting: *Deleted

## 2019-07-15 ENCOUNTER — Other Ambulatory Visit: Payer: Self-pay | Admitting: Adult Health

## 2019-07-15 VITALS — Temp 97.2°F | Ht 64.0 in | Wt 262.2 lb

## 2019-07-15 DIAGNOSIS — M797 Fibromyalgia: Secondary | ICD-10-CM

## 2019-07-15 DIAGNOSIS — K219 Gastro-esophageal reflux disease without esophagitis: Secondary | ICD-10-CM

## 2019-07-15 DIAGNOSIS — E785 Hyperlipidemia, unspecified: Secondary | ICD-10-CM

## 2019-07-15 DIAGNOSIS — F32A Depression, unspecified: Secondary | ICD-10-CM

## 2019-07-15 DIAGNOSIS — M792 Neuralgia and neuritis, unspecified: Secondary | ICD-10-CM

## 2019-07-15 DIAGNOSIS — I10 Essential (primary) hypertension: Secondary | ICD-10-CM

## 2019-07-15 DIAGNOSIS — Z1159 Encounter for screening for other viral diseases: Secondary | ICD-10-CM

## 2019-07-15 DIAGNOSIS — Z86010 Personal history of colonic polyps: Secondary | ICD-10-CM

## 2019-07-15 DIAGNOSIS — F329 Major depressive disorder, single episode, unspecified: Secondary | ICD-10-CM

## 2019-07-15 MED FILL — DEXILANT DR 60 MG CAPSULE: 60 | 90 days supply | Qty: 90 | Fill #0

## 2019-07-15 MED FILL — GABAPENTIN 300 MG CAPSULE: 300 | 68 days supply | Qty: 540 | Fill #0

## 2019-07-15 MED FILL — PROPRANOLOL 60 MG TABLET: 60 | 90 days supply | Qty: 270 | Fill #0

## 2019-07-15 MED FILL — ATORVASTATIN 10 MG TABLET: 10 | 90 days supply | Qty: 90 | Fill #0

## 2019-07-15 MED FILL — ONDANSETRON ODT 4 MG TABLET: 4 | 7 days supply | Qty: 20 | Fill #3

## 2019-07-15 MED FILL — LISINOPRIL 20 MG TABLET: 20 | 90 days supply | Qty: 90 | Fill #0

## 2019-07-15 MED FILL — DULoxetine HCL 60 MG CPEP: 60 | 90 days supply | Qty: 90 | Fill #0

## 2019-07-15 MED FILL — SERTRALINE HCL 50 MG TABS: 50 | 30 days supply | Qty: 30 | Fill #0

## 2019-07-15 NOTE — Progress Notes (Signed)

## 2019-07-15 NOTE — Telephone Encounter (Signed)
Requested medication (s) are due for refill today: yes  Requested medication (s) are on the active medication list: yes  Last refill: 04/13/2019  Future visit scheduled:no  Notes to clinic:  Medication last filled by different provider other than pcp Review for refill   Requested Prescriptions  Pending Prescriptions Disp Refills   atorvastatin (LIPITOR) 10 MG tablet [Pharmacy Med Name: ATORVASTATIN 10 MG TABLET 10 TAB] 90 tablet 1    Sig: TAKE 1 TABLET BY MOUTH ONCE DAILY AT 6PM      Cardiovascular:  Antilipid - Statins Failed - 07/15/2019  7:29 AM      Failed - Triglycerides in normal range and within 360 days    Triglycerides  Date Value Ref Range Status  03/09/2019 151 (H) 0 - 149 mg/dL Final          Passed - Total Cholesterol in normal range and within 360 days    Cholesterol, Total  Date Value Ref Range Status  03/09/2019 163 100 - 199 mg/dL Final          Passed - LDL in normal range and within 360 days    LDL Calculated  Date Value Ref Range Status  03/09/2019 73 0 - 99 mg/dL Final          Passed - HDL in normal range and within 360 days    HDL  Date Value Ref Range Status  03/09/2019 60 >39 mg/dL Final          Passed - Patient is not pregnant      Passed - Valid encounter within last 12 months    Recent Outpatient Visits           1 month ago Dyspnea on exertion   [facility], Irma M, MD   4 months ago Iron deficiency anemia due to chronic blood loss   [facility], Lilia Argue, MD   4 months ago Iron deficiency anemia, unspecified iron deficiency anemia type   Primary Care at [facility], Arlie Solomons, MD   6 months ago Prediabetes   [facility], Ranell Patrick, MD   8 months ago Iron deficiency anemia, unspecified iron deficiency anemia type   [facility], Lilia Argue, MD       Future Appointments             In 3 weeks Leonie Man, MD [place], [facility]   In  2 months Rutherford Guys, MD Primary [facility] at [place], [place]              gabapentin (NEURONTIN) 300 MG capsule [Pharmacy Med Name: GABAPENTIN 300 MG CAPSULE 300 CAP] 540 capsule 1    Sig: TAKE 1-2 CAPSULES BY MOUTH FOUR TIMES DAILY      Neurology: Anticonvulsants - gabapentin Passed - 07/15/2019  7:29 AM      Passed - Valid encounter within last 12 months    Recent Outpatient Visits           1 month ago Dyspnea on exertion   [facility], Lilia Argue, MD   4 months ago Iron deficiency anemia due to chronic blood loss   [facility], Lilia Argue, MD   4 months ago Iron deficiency anemia, unspecified iron deficiency anemia type   Primary Care at [facility], Arlie Solomons, MD   6 months ago Prediabetes   [facility] at [facility],  Ranell Patrick, MD   8 months ago Iron deficiency anemia, unspecified iron deficiency anemia type   [facility], Lilia Argue, MD       Future Appointments             In 3 weeks Leonie Man, MD [place], [place]   In 2 months Rutherford Guys, MD Primary Care at [place], [place]              lisinopril (ZESTRIL) 20 MG tablet [Pharmacy Med Name: LISINOPRIL 20 MG TABLET 20 TAB] 90 tablet 1    Sig: TAKE 1 TABLET BY MOUTH ONCE DAILY      Cardiovascular:  ACE Inhibitors Passed - 07/15/2019  7:29 AM      Passed - Cr in normal range and within 180 days    Creat  Date Value Ref Range Status  05/21/2016 1.01 (H) 0.50 - 0.99 mg/dL Final    Comment:      For patients > or = 63 years of age: The upper reference limit for Creatinine is approximately 13% higher for people identified as African-American.      Creatinine, Ser  Date Value Ref Range Status  05/31/2019 0.93 0.57 - 1.00 mg/dL Final          Passed - K in normal range and within 180 days    Potassium  Date Value Ref Range Status  05/31/2019 4.2 3.5 - 5.2 mmol/L Final          Passed - Patient is not pregnant      Passed -  Last BP in normal range    BP Readings from Last 1 Encounters:  06/13/19 132/78          Passed - Valid encounter within last 6 months    Recent Outpatient Visits           1 month ago Dyspnea on exertion   [facility], Lilia Argue, MD   4 months ago Iron deficiency anemia due to chronic blood loss   [facility], Lilia Argue, MD   4 months ago Iron deficiency anemia, unspecified iron deficiency anemia type   [facility] Care at [facility], Arlie Solomons, MD   6 months ago Prediabetes   [facility], Ranell Patrick, MD   8 months ago Iron deficiency anemia, unspecified iron deficiency anemia type   [facility], Lilia Argue, MD       Future Appointments             In 3 weeks Leonie Man, MD [facility], [facility]   In 2 months Rutherford Guys, MD Primary Care at [place], [facility]

## 2019-07-15 NOTE — Telephone Encounter (Signed)
Requested medication (s) are due for refill today: yes  Requested medication (s) are on the active medication list: yes  Last refill:  04/13/2019  Future visit scheduled:no  Notes to clinic:  last filled by different provider Patient needs a PH-2 completed  Review for refill   Requested Prescriptions  Pending Prescriptions Disp Refills   sertraline (ZOLOFT) 50 MG tablet [Pharmacy Med Name: SERTRALINE HCL 50 MG TABLET 50 TAB] 90 tablet 1    Sig: TAKE 1 TABLET BY MOUTH ONCE DAILY      Psychiatry:  Antidepressants - SSRI Failed - 07/15/2019  7:31 AM      Failed - Completed PHQ-2 or PHQ-9 in the last 360 days.      Passed - Valid encounter within last 6 months    Recent Outpatient Visits           1 month ago Dyspnea on exertion   [facility], Lilia Argue, MD   4 months ago Iron deficiency anemia due to chronic blood loss   [facility], Lilia Argue, MD   4 months ago Iron deficiency anemia, unspecified iron deficiency anemia type   [facility] at [facility], Arlie Solomons, MD   6 months ago Prediabetes   [facility], Ranell Patrick, MD   8 months ago Iron deficiency anemia, unspecified iron deficiency anemia type   [facility], Lilia Argue, MD       Future Appointments             In 3 weeks Leonie Man, MD [place], [facility]   In 2 months Rutherford Guys, MD [facility] at [place], [facility]              DULoxetine (CYMBALTA) 60 MG capsule [Pharmacy Med Name: DULoxetine HCL 60 MG CPEP 60 CAP] 90 capsule 1    Sig: TAKE 1 CAPSULE BY MOUTH ONCE DAILY      Psychiatry: Antidepressants - SNRI Failed - 07/15/2019  7:31 AM      Failed - Completed PHQ-2 or PHQ-9 in the last 360 days.      Passed - Last BP in normal range    BP Readings from Last 1 Encounters:  06/13/19 132/78          Passed - Valid encounter within last 6 months    Recent Outpatient Visits           1 month ago Dyspnea on  exertion   [facility], Lilia Argue, MD   4 months ago Iron deficiency anemia due to chronic blood loss   [facility], Lilia Argue, MD   4 months ago Iron deficiency anemia, unspecified iron deficiency anemia type   [facility] at [facility], Arlie Solomons, MD   6 months ago Prediabetes   [facility], Ranell Patrick, MD   8 months ago Iron deficiency anemia, unspecified iron deficiency anemia type   [facility], Lilia Argue, MD       Future Appointments             In 3 weeks Leonie Man, MD [place], [place]   In 2 months Rutherford Guys, MD [facility] at [place], [place] 60 MG capsule [Pharmacy Med Name: [place] DR 60 MG CAPSULE 60 CAP] 90  capsule 1    Sig: TAKE 1 CAPSULE BY MOUTH ONCE DAILY      Gastroenterology: Proton Pump Inhibitors Passed - 07/15/2019  7:31 AM      Passed - Valid encounter within last 12 months    Recent Outpatient Visits           1 month ago Dyspnea on exertion   [facility], Lilia Argue, MD   4 months ago Iron deficiency anemia due to chronic blood loss   [facility], Lilia Argue, MD   4 months ago Iron deficiency anemia, unspecified iron deficiency anemia type   Primary Care at [facility], Arlie Solomons, MD   6 months ago Prediabetes   [facility], Ranell Patrick, MD   8 months ago Iron deficiency anemia, unspecified iron deficiency anemia type   [facility], Lilia Argue, MD       Future Appointments             In 3 weeks Leonie Man, MD [facility] [place], [facility]   In 2 months Rutherford Guys, MD Primary [facility] at [place], [facility]

## 2019-07-20 ENCOUNTER — Ambulatory Visit (INDEPENDENT_AMBULATORY_CARE_PROVIDER_SITE_OTHER): Payer: 59

## 2019-07-20 DIAGNOSIS — Z1159 Encounter for screening for other viral diseases: Secondary | ICD-10-CM | POA: Diagnosis not present

## 2019-07-21 LAB — SARS CORONAVIRUS 2 (TAT 6-24 HRS): SARS Coronavirus 2: NEGATIVE

## 2019-07-25 ENCOUNTER — Other Ambulatory Visit: Payer: Self-pay

## 2019-07-25 ENCOUNTER — Encounter: Payer: Self-pay | Admitting: Gastroenterology

## 2019-07-25 ENCOUNTER — Ambulatory Visit (AMBULATORY_SURGERY_CENTER): Payer: 59 | Admitting: Gastroenterology

## 2019-07-25 VITALS — BP 121/79 | HR 71 | Temp 97.9°F | Resp 16 | Ht 64.0 in | Wt 262.2 lb

## 2019-07-25 DIAGNOSIS — Z1211 Encounter for screening for malignant neoplasm of colon: Secondary | ICD-10-CM | POA: Diagnosis not present

## 2019-07-25 DIAGNOSIS — G4733 Obstructive sleep apnea (adult) (pediatric): Secondary | ICD-10-CM | POA: Diagnosis not present

## 2019-07-25 DIAGNOSIS — Z86010 Personal history of colonic polyps: Secondary | ICD-10-CM | POA: Diagnosis not present

## 2019-07-25 DIAGNOSIS — K621 Rectal polyp: Secondary | ICD-10-CM | POA: Diagnosis not present

## 2019-07-25 DIAGNOSIS — I509 Heart failure, unspecified: Secondary | ICD-10-CM | POA: Diagnosis not present

## 2019-07-25 MED ORDER — SODIUM CHLORIDE 0.9 % IV SOLN: 500.0000 mL | Freq: Once | INTRAVENOUS | Status: DC

## 2019-07-25 NOTE — Progress Notes (Signed)
Report given to PACU, vss 

## 2019-07-25 NOTE — Patient Instructions (Signed)
YOU HAD AN ENDOSCOPIC PROCEDURE TODAY AT THE [facility]:   Refer to the procedure report that was given to you for any specific questions about what was found during the examination.  If the procedure report does not answer your questions, please call your gastroenterologist to clarify.  If you requested that your care partner not be given the details of your procedure findings, then the procedure report has been included in a sealed envelope for you to review at your convenience later.  YOU SHOULD EXPECT: Some feelings of bloating in the abdomen. Passage of more gas than usual.  Walking can help get rid of the air that was put into your GI tract during the procedure and reduce the bloating. If you had a lower endoscopy (such as a colonoscopy or flexible sigmoidoscopy) you may notice spotting of blood in your stool or on the toilet paper. If you underwent a bowel prep for your procedure, you may not have a normal bowel movement for a few days.  Please Note:  You might notice some irritation and congestion in your nose or some drainage.  This is from the oxygen used during your procedure.  There is no need for concern and it should clear up in a day or so.  SYMPTOMS TO REPORT IMMEDIATELY:   Following lower endoscopy (colonoscopy or flexible sigmoidoscopy):  Excessive amounts of blood in the stool  Significant tenderness or worsening of abdominal pains  Swelling of the abdomen that is new, acute  Fever of 100F or higher  For urgent or emergent issues, a gastroenterologist can be reached at any hour by calling [phone].   DIET:  We do recommend a small meal at first, but then you may proceed to your regular diet.  Drink plenty of fluids but you should avoid alcoholic beverages for 24 hours.  ACTIVITY:  You should plan to take it easy for the rest of today and you should NOT DRIVE or use heavy machinery until tomorrow (because of the sedation medicines used during the test).     FOLLOW UP: Our staff will call the number listed on your records 48-72 hours following your procedure to check on you and address any questions or concerns that you may have regarding the information given to you following your procedure. If we do not reach you, we will leave a message.  We will attempt to reach you two times.  During this call, we will ask if you have developed any symptoms of COVID 19. If you develop any symptoms (ie: fever, flu-like symptoms, shortness of breath, cough etc.) before then, please call [phone].  If you test positive for Covid 19 in the 2 weeks post procedure, please call and report this information to us.    If any biopsies were taken you will be contacted by phone or by letter within the next 1-3 weeks.  Please call us at [phone] if you have not heard about the biopsies in 3 weeks.    SIGNATURES/CONFIDENTIALITY: You and/or your care partner have signed paperwork which will be entered into your electronic medical record.  These signatures attest to the fact that that the information above on your After Visit Summary has been reviewed and is understood.  Full responsibility of the confidentiality of this discharge information lies with you and/or your care-partner. 

## 2019-07-25 NOTE — Progress Notes (Signed)
Called to room to assist during endoscopic procedure.  Patient ID and intended procedure confirmed with present staff. Received instructions for my participation in the procedure from the performing physician.  

## 2019-07-25 NOTE — Op Note (Signed)
[facility] Patient Name: [name] Procedure Date: 07/25/2019 1:42 PM MRN: [id number] Endoscopist: Milus Banister , MD Age: 63 Referring MD:  Date of Birth: [birth year] Gender: Female Account #: [id number] Procedure:                Flexible Sigmoidoscopy Indications:              High risk colon cancer surveillance: Personal                            history of colonic polyps; Colonoscopy 12/2018 1.6cm                            semi pedunculated polyp removed from rectum 2 weeks                            ago with snare/cautery, fragmented sample showed TA                            with HGD at cautery margin. Flex sigmoidoscopy                            01/2019 site removed with snare/cautery, edges                            biopsied, site tattoo'd, all path showed no                            residual adenoma Medicines:                Monitored Anesthesia Care Procedure:                Pre-Anesthesia Assessment:                           - Prior to the procedure, a History and Physical                            was performed, and patient medications and                            allergies were reviewed. The patient's tolerance of                            previous anesthesia was also reviewed. The risks                            and benefits of the procedure and the sedation                            options and risks were discussed with the patient.                            All questions were answered, and informed consent  was obtained. Prior Anticoagulants: The patient has                            taken no previous anticoagulant or antiplatelet                            agents. ASA Grade Assessment: III - A patient with                            severe systemic disease. After reviewing the risks                            and benefits, the patient was deemed in                            satisfactory condition to undergo the  procedure.                           After obtaining informed consent, the scope was                            passed under direct vision. The Colonoscope was                            introduced through the anus and advanced to the the                            splenic flexure. The flexible sigmoidoscopy was                            accomplished without difficulty. The patient                            tolerated the procedure well. The quality of the                            bowel preparation was adequate. Scope In: 1:48:03 PM Scope Out: 1:52:18 PM Total Procedure Duration: 0 hours 4 minutes 15 seconds  Findings:                 The site of recent polypectomy in the distal rectum                            was easily located (scar tissue and previous Spot                            tattoo). There was no residual or recurrent                            adenomatous mucosa however the site was sampled                            with biopsy forceps.  The examination to the splenic flexure was                            otherwise normal. Complications:            No immediate complications. Estimated blood loss:                            None. Estimated Blood Loss:     Estimated blood loss: none. Impression:               - The site of recent polypectomy in the distal                            rectum was easily located (scar tissue and previous                            Spot tattoo). There was no residual or recurrent                            adenomatous mucosa however the site was sampled                            with biopsy forceps.                           - The examination to the splenic flexure was                            otherwise normal. Recommendation:           - Patient has a contact number available for                            emergencies. The signs and symptoms of potential                            delayed complications were  discussed with the                            patient. Return to normal activities tomorrow.                            Written discharge instructions were provided to the                            patient.                           - Resume previous diet.                           - Await path results. Likely repeat colonoscopy in                            12/2021. Milus Banister, MD 07/25/2019 2:04:12 PM This report has been signed  electronically.

## 2019-07-25 NOTE — Progress Notes (Signed)
Temp by JB Vitals by DT  Pt's states no medical or surgical changes since previsit or office visit. 

## 2019-07-27 ENCOUNTER — Telehealth: Payer: Self-pay | Admitting: *Deleted

## 2019-07-27 NOTE — Telephone Encounter (Signed)
  Follow up Call-  Call back number 07/25/2019 01/04/2019 12/21/2018  Post procedure Call Back phone  # [phone]  Permission to leave phone message Yes Yes Yes  Some recent data might be hidden     Patient questions:  Do you have a fever, pain , or abdominal swelling? No. Pain Score  0 *  Have you tolerated food without any problems? Yes.    Have you been able to return to your normal activities? Yes.    Do you have any questions about your discharge instructions: Diet   No. Medications  No. Follow up visit  No.  Do you have questions or concerns about your Care? No.  Actions: * If pain score is 4 or above: No action needed, pain <4.  1. Have you developed a fever since your procedure? no  2.   Have you had an respiratory symptoms (SOB or cough) since your procedure? no  3.   Have you tested positive for COVID 19 since your procedure no  4.   Have you had any family members/close contacts diagnosed with the COVID 19 since your procedure?  no   If yes to any of these questions please route to Joylene John, RN and Alphonsa Gin, Therapist, sports.

## 2019-08-01 ENCOUNTER — Encounter: Payer: Self-pay | Admitting: Gastroenterology

## 2019-08-09 DIAGNOSIS — L570 Actinic keratosis: Secondary | ICD-10-CM | POA: Diagnosis not present

## 2019-08-09 DIAGNOSIS — L821 Other seborrheic keratosis: Secondary | ICD-10-CM | POA: Diagnosis not present

## 2019-08-09 DIAGNOSIS — D0439 Carcinoma in situ of skin of other parts of face: Secondary | ICD-10-CM | POA: Diagnosis not present

## 2019-08-10 ENCOUNTER — Encounter: Payer: Self-pay | Admitting: Cardiology

## 2019-08-10 ENCOUNTER — Ambulatory Visit (INDEPENDENT_AMBULATORY_CARE_PROVIDER_SITE_OTHER): Payer: 59 | Admitting: Cardiology

## 2019-08-10 ENCOUNTER — Other Ambulatory Visit: Payer: Self-pay

## 2019-08-10 VITALS — BP 137/87 | HR 60 | Temp 97.3°F | Ht 64.0 in | Wt 263.0 lb

## 2019-08-10 DIAGNOSIS — E7800 Pure hypercholesterolemia, unspecified: Secondary | ICD-10-CM | POA: Diagnosis not present

## 2019-08-10 DIAGNOSIS — I5032 Chronic diastolic (congestive) heart failure: Secondary | ICD-10-CM | POA: Diagnosis not present

## 2019-08-10 DIAGNOSIS — I10 Essential (primary) hypertension: Secondary | ICD-10-CM

## 2019-08-10 DIAGNOSIS — R0600 Dyspnea, unspecified: Secondary | ICD-10-CM | POA: Diagnosis not present

## 2019-08-10 DIAGNOSIS — Z6841 Body Mass Index (BMI) 40.0 and over, adult: Secondary | ICD-10-CM | POA: Diagnosis not present

## 2019-08-10 DIAGNOSIS — R0609 Other forms of dyspnea: Secondary | ICD-10-CM

## 2019-08-10 NOTE — Progress Notes (Signed)
PCP: Rutherford Guys, MD  Clinic Note: Chief Complaint  Patient presents with  . Follow-up    Test results  . Shortness of Breath    Exertional    HPI: [name] is a 64 y.o. female with a PMH below who presents today for 6 month f/u eval for DOE - Echo results.  Last seen on Oct 7,2020 for follow-up after echocardiogram with persistent exertional dyspnea.  To exclude ischemic coronary disease we ordered a coronary CTA reviewed below.  Recent Hospitalizations:   n/a  Studies Personally Reviewed - (if available, images/films reviewed: From Epic Chart or Care Everywhere)  (06/07/2019)Coronary Calcium Score & CT Angiogram: ? Dilated PA trunk & RV.  Cor Ca Score 10. Minimal prox RCA calcified ~20% stenosis.  LOW RISK   Interval History: [name] returns today for still noticing that she gets short of breath very quickly when doing things.  Its also noted that apparently her oxygen saturation drops sometimes with exertion.  This was done last time she saw her PCP. She is using CPAP routinely and does not have any significant wheezing or or cough.  She has done better with iron supplementation and iron infusions.  She is due to have her CBC checked soon to see how well she is recovered. She does not really have chest tightness except when is hard to catch her breath.  Nothing with rest.  No true PND but some mild orthopnea unless she is using CPAP.  Trivial edema. No arrhythmia symptoms.   No claudication.  ROS: A brief ROS was performed. Review of Systems  Constitutional: Negative for chills, fever and malaise/fatigue (low Energy -- ? Anemic).  HENT: Negative for congestion and nosebleeds.   Eyes: Negative for blurred vision and double vision.  Respiratory: Positive for shortness of breath (DOE). Negative for cough and wheezing.   Cardiovascular: Positive for leg swelling (see HPI).  Gastrointestinal: Positive for heartburn (off & on - has Hiatal Hernia). Negative for blood  in stool, constipation, melena and nausea.  Genitourinary: Negative for hematuria.  Musculoskeletal: Falls: As noted above.  Neurological: Positive for dizziness (when walking - poor balance). Negative for weakness and headaches (off & on - NOT MIGRAINES).  Psychiatric/Behavioral: The patient is not nervous/anxious.    The patient does not have symptoms concerning for COVID-19 infection (fever, chills, cough, or new shortness of breath).  The patient is practicing social distancing.   I have reviewed and (if needed) personally updated the patient's problem list, medications, allergies, past medical and surgical history, social and family history.   Past Medical History:  Diagnosis Date  . Allergy    [name], Flonase  . Anemia   . Anxiety   . Arthritis   . Chronic kidney disease   . Colon polyps   . Depression   . Fibromyalgia   . Gastritis   . GERD (gastroesophageal reflux disease)   . Hernia, hiatal   . HLD (hyperlipidemia)   . Hypertension   . Migraine   . Neuropathy   . Osteoporosis   . Pneumonia   . Sleep apnea    c-pap nightly  . Sleep apnea with use of continuous positive airway pressure (CPAP)   . Thyroid goiter     Past Surgical History:  Procedure Laterality Date  . 48-Hour Holter Monitor  06/2017   Mostly normal sinus rhythm.  Average heart rate 71 bpm.  Minimum 54 bpm.  Maximum sinus rate 114 bpm.  Rare PACs and PVCs.  No A. fib, atrial flutter, SVT or VT.  One brief run of 8 beat PAT.  Marland Kitchen ABDOMINAL HYSTERECTOMY     ovaries intact; DUB.  No dysplasia.  Marland Kitchen JOINT REPLACEMENT    . OPEN REDUCTION INTERNAL FIXATION (ORIF) DISTAL RADIAL FRACTURE Right 07/09/2017   Procedure: RIGHT WRIST OPEN REDUCTION INTERNAL FIXATION (ORIF) DISTAL RADIAL FRACTURE AND REPAIR AS INDICATED;  Surgeon: Iran Planas, MD;  Location: [facility];  Service: Orthopedics;  Laterality: Right;  . PARTIAL HYSTERECTOMY    . ROTATOR CUFF REPAIR Right 2009  . SHOULDER SURGERY Right    shoulder  dislocation, fall, and elbow unla nerve  . SHOULDER SURGERY Right 2015   labral tear  . TRANSTHORACIC ECHOCARDIOGRAM  06/2017    EF 55-60%.  grade 1 diastolic dysfunction.  Otherwise normal  . TUBAL LIGATION    . ULNAR NERVE REPAIR Right 08/19/2014    48-hour monitor: November 21-23, 2018.  Mostly normal sinus rhythm: Average heart rate 71 bpm  Minimum heart rate 54 bpm (sinus bradycardia). Maximal heart rate 114 bpm (sinus tachycardia).  Only 1 PVC noted. Rare PACs noted (less than 1%, 94 total) -mostly singlets.  1 run of PAT (8 beats) otherwise no significant arrhythmias noted.  No atrial fibrillation, atrial flutter, SVT, VT  2D Echocardiogram June 24, 2017: EF 55-60%.  grade 1 diastolic dysfunction.  Otherwise normal  Current Meds  Medication Sig  . Albuterol Sulfate (PROAIR RESPICLICK) 123XX123 (90 Base) MCG/ACT AEPB Inhale 2 puffs into the lungs 4 (four) times daily as needed.  Marland Kitchen atorvastatin (LIPITOR) 10 MG tablet TAKE 1 TABLET BY MOUTH ONCE DAILY AT 6PM  . azelastine (ASTELIN) 0.1 % nasal spray Place 2 sprays into both nostrils 2 (two) times daily. Use in each nostril as directed  . butalbital-acetaminophen-caffeine (FIORICET) 50-325-40 MG tablet Take 1-2 tablets by mouth every 6 (six) hours as needed for headache.  . cyclobenzaprine (FLEXERIL) 10 MG tablet TAKE 1/2-1 TABLET BY MOUTH 3 TIMES DAILY AS NEEDED FOR MUSCLE SPASMS.  Marland Kitchen DEXILANT 60 MG capsule TAKE 1 CAPSULE BY MOUTH ONCE DAILY  . DULoxetine (CYMBALTA) 60 MG capsule TAKE 1 CAPSULE BY MOUTH ONCE DAILY  . fluticasone (FLONASE) 50 MCG/ACT nasal spray Place 1 spray into both nostrils daily.  . furosemide (LASIX) 20 MG tablet Take 1 tablet (20 mg total) by mouth 2 (two) times daily as needed.  . gabapentin (NEURONTIN) 300 MG capsule TAKE 1-2 CAPSULES BY MOUTH FOUR TIMES DAILY  . lisinopril (ZESTRIL) 20 MG tablet TAKE 1 TABLET BY MOUTH ONCE DAILY  . ondansetron (ZOFRAN-ODT) 4 MG disintegrating tablet DISSOLVE 1 TABLET BY MOUTH  EVERY 8 HOURS AS NEEDED FOR NAUSEA OR VOMITING.  Marland Kitchen oxyCODONE-acetaminophen (PERCOCET/ROXICET) 5-325 MG tablet Take 1 tablet by mouth every 12 (twelve) hours as needed for severe pain.  Marland Kitchen propranolol (INDERAL) 60 MG tablet TAKE 1 TABLET (60 MG TOTAL) BY MOUTH 3 (THREE) TIMES DAILY.  Marland Kitchen sertraline (ZOLOFT) 50 MG tablet TAKE 1 TABLET BY MOUTH ONCE DAILY  . topiramate (TOPAMAX) 50 MG tablet Take 1 tablet (50 mg total) by mouth 2 (two) times daily.    Allergies  Allergen Reactions  . Sulfa Antibiotics Itching    Social History   Tobacco Use  . Smoking status: Former Smoker    Quit date: 08/04/1998    Years since quitting: 21.0  . Smokeless tobacco: Never Used  Substance Use Topics  . Alcohol use: No  . Drug use: No   Social History   Social History  Narrative   Marital status: married x 20 years; moderately happy     Children: 3 children (47 [name], 18 Dianna, 40 April); 8 grandchildren; 0 gg     Lives: with husband/Steve, April, 2 granddaughters     Employment:  Unemployed; quit working 2015 with work related injury; awaiting disability in 2018; disability approved in 02/2017.       Tobacco: quit smoking in 2016; smoked x 2 years.      Alcohol: none      Drugs: none      Exercise: rarely in 2018          family history includes Arthritis in her sister; Cancer in her brother, brother, maternal grandfather, maternal grandmother, and sister; Cancer (age of onset: 41) in her brother; Cancer (age of onset: 94) in her father; Depression in her brother, mother, sister, and sister; Gout in her brother and mother; Heart defect in her paternal grandfather; Heart disease in her paternal grandmother; Heart disease (age of onset: 69) in her father; Heart disease (age of onset: 81) in her mother; Hyperlipidemia in her brother, brother, brother, father, and mother; Hypertension in her brother, brother, brother, brother, brother, father, and mother; Hypothyroidism in her mother; Melanoma in her brother  and brother; Prostate cancer in her father; Stroke (age of onset: 93) in her father.  Wt Readings from Last 3 Encounters:  08/10/19 263 lb (119.3 kg)  07/25/19 262 lb 3.2 oz (118.9 kg)  07/15/19 262 lb 3.2 oz (118.9 kg)    PHYSICAL EXAM BP 137/87   Pulse 60   Temp (!) 97.3 F (36.3 C)   Ht 5\' 4"  (1.626 m)   Wt 263 lb (119.3 kg)   SpO2 98%   BMI 45.14 kg/m  Physical Exam  Constitutional: She is oriented to person, place, and time. She appears well-developed and well-nourished. No distress.  Morbidly obese. Well groomed  HENT:  Head: Normocephalic and atraumatic.  Neck: No hepatojugular reflux and no JVD present. Carotid bruit is not present.  Cardiovascular: Normal rate, regular rhythm and normal pulses.  No extrasystoles are present. PMI is not displaced (Unable to palpate). Exam reveals distant heart sounds. Exam reveals no gallop and no friction rub.  No murmur heard. Pulmonary/Chest: Effort normal. No respiratory distress. She has no wheezes. She has no rales. She exhibits tenderness.  Mostly distant breath sounds but mild interstitial sounds.  Musculoskeletal:        General: Edema (Trivial) present. Normal range of motion.     Cervical back: Normal range of motion and neck supple.  Neurological: She is alert and oriented to person, place, and time.  Psychiatric: She has a normal mood and affect. Her behavior is normal. Judgment and thought content normal.  Nursing note and vitals reviewed.    Adult ECG Report NSR, rate 63 bpm.  NS ST (nonspecific ST and T wave changes).  Stable EKG.  Other studies Reviewed: Additional studies/ records that were reviewed today include:  Recent Labs:   Lab Results  Component Value Date   CREATININE 0.93 05/31/2019   BUN 8 05/31/2019   NA 142 05/31/2019   K 4.2 05/31/2019   CL 109 (H) 05/31/2019   CO2 21 05/31/2019   Lab Results  Component Value Date   CHOL 163 03/09/2019   HDL 60 03/09/2019   LDLCALC 73 03/09/2019   TRIG 151  (H) 03/09/2019   CHOLHDL 2.7 03/09/2019     ASSESSMENT / PLAN: Problem List Items Addressed This Visit  Essential hypertension (Chronic)    She is on propranolol for other reasons along with lisinopril.  If her pressures were to go up, would consider adding calcium channel blocker such as amlodipine.  Indication here would be for blood pressure control but also since suggestion of possible pulmonary hypertension with dilated PA and RV on CT scan as well as possible microvascular disease benefit.      Pure hypercholesterolemia (Chronic)    Most recent labs from August 2020 medically well-controlled.  Continue low-dose statin.  Based on coronary CTA, this is well within goal.      Class 3 severe obesity due to excess calories with serious comorbidity and body mass index (BMI) of 40.0 to 44.9 in adult [facility]) (Chronic)    I suspect this is the most likely culprit for her dyspnea and that she has oxygen saturations dropping the walking.  She probably has some restrictive lung disease.  Due to see pulmonary medicine for full evaluation.  May be also some component of obesity hypoventilation with a outsa.  With possible pulmonary pretension consider adding amlodipine.  Since she is not really able to do much exercise, needs to watch dietary intake.  Once she is able to start walking, needs to the least do what she is capable of doing.      Chronic diastolic heart failure (HCC) (Chronic)    She has biatrial enlargement on echo which is mild and some mild RV just function, but her LV looks fine and her coronaries looked okay on the coronary CTA.  Minimal disease.  She probably does have some diastolic dysfunction related dyspnea, but not really noting PND orthopnea.  It looks to be even some possible OSA related pulmonary hypertension.  Plan: Continue current dose of Lasix with additional doses as needed.  She is on a beta-blocker albeit not classic cardiac beta-blocker along with  lisinopril.  As noted, consider amlodipine for next blood pressure control medicine followed by spironolactone.      DOE (dyspnea on exertion) - Primary    Multifactorial.  Coronary calcium score was CTA does not show any coronary disease.  Cannot exclude microvascular disease, also with mildly dilated pulmonary artery, could be some component of pulmonary hypertension..  Would not suspect significant pulmonary pretension as the echocardiogram did not show any significant TR and therefore did not suggest pulmonary pretension.  Plan: Continue blood pressure control low-dose diuretic and consider amlodipine.  Otherwise would probably benefit from additional pulmonary evaluation.    Ultimately, she needs to lose weight.         COVID-19 Education: The signs and symptoms of COVID-19 were discussed with the patient and how to seek care for testing (follow up with PCP or arrange E-visit).   The importance of social distancing was discussed today.  Current medicines are reviewed at length with the patient today. (+/- concerns) n/a The following changes have been made: n/a  Patient Instructions  Medication Instructions:  No changes *If you need a refill on your cardiac medications before your next appointment, please call your pharmacy*  Lab Work: Not needed  Testing/Procedures: Not needed   Follow-Up: At [facility], you and your health needs are our priority.  As part of our continuing mission to provide you with exceptional heart care, we have created designated Provider Care Teams.  These Care Teams include your primary Cardiologist (physician) and Advanced Practice Providers (APPs -  Physician Assistants and Nurse Practitioners) who all work together to provide you with the  care you need, when you need it.  Your next appointment:   12 month(s)  The format for your next appointment:   In Person  Provider:   Glenetta Hew, MD     Studies Ordered:   No orders of the  defined types were placed in this encounter.     Glenetta Hew, M.D., M.S. Interventional Cardiologist   Pager # [phone] Phone # [phone] [address] [place], [place] [address]   Thank you for choosing Heartcare at [facility]!!

## 2019-08-10 NOTE — Patient Instructions (Signed)

## 2019-08-11 ENCOUNTER — Encounter: Payer: Self-pay | Admitting: Cardiology

## 2019-08-11 NOTE — Assessment & Plan Note (Signed)
She has biatrial enlargement on echo which is mild and some mild RV just function, but her LV looks fine and her coronaries looked okay on the coronary CTA.  Minimal disease.  She probably does have some diastolic dysfunction related dyspnea, but not really noting PND orthopnea.  It looks to be even some possible OSA related pulmonary hypertension.  Plan: Continue current dose of Lasix with additional doses as needed.  She is on a beta-blocker albeit not classic cardiac beta-blocker along with lisinopril.  As noted, consider amlodipine for next blood pressure control medicine followed by spironolactone.

## 2019-08-11 NOTE — Assessment & Plan Note (Addendum)
I suspect this is the most likely culprit for her dyspnea and that she has oxygen saturations dropping the walking.  She probably has some restrictive lung disease.  Due to see pulmonary medicine for full evaluation.  May be also some component of obesity hypoventilation with a outsa.  With possible pulmonary pretension consider adding amlodipine.  Since she is not really able to do much exercise, needs to watch dietary intake.  Once she is able to start walking, needs to the least do what she is capable of doing.

## 2019-08-11 NOTE — Assessment & Plan Note (Signed)
She is on propranolol for other reasons along with lisinopril.  If her pressures were to go up, would consider adding calcium channel blocker such as amlodipine.  Indication here would be for blood pressure control but also since suggestion of possible pulmonary hypertension with dilated PA and RV on CT scan as well as possible microvascular disease benefit.

## 2019-08-11 NOTE — Assessment & Plan Note (Signed)
Multifactorial.  Coronary calcium score was CTA does not show any coronary disease.  Cannot exclude microvascular disease, also with mildly dilated pulmonary artery, could be some component of pulmonary hypertension..  Would not suspect significant pulmonary pretension as the echocardiogram did not show any significant TR and therefore did not suggest pulmonary pretension.  Plan: Continue blood pressure control low-dose diuretic and consider amlodipine.  Otherwise would probably benefit from additional pulmonary evaluation.    Ultimately, she needs to lose weight.

## 2019-08-11 NOTE — Assessment & Plan Note (Signed)
Most recent labs from August 2020 medically well-controlled.  Continue low-dose statin.  Based on coronary CTA, this is well within goal.

## 2019-08-17 ENCOUNTER — Other Ambulatory Visit: Payer: Self-pay

## 2019-08-17 ENCOUNTER — Inpatient Hospital Stay: Payer: 59 | Attending: Oncology

## 2019-08-17 DIAGNOSIS — D509 Iron deficiency anemia, unspecified: Secondary | ICD-10-CM | POA: Diagnosis not present

## 2019-08-17 DIAGNOSIS — D508 Other iron deficiency anemias: Secondary | ICD-10-CM

## 2019-08-17 LAB — CBC WITH DIFFERENTIAL/PLATELET
Abs Immature Granulocytes: 0.02 10*3/uL (ref 0.00–0.07)
Basophils Absolute: 0 10*3/uL (ref 0.0–0.1)
Basophils Relative: 0 %
Eosinophils Absolute: 0.1 10*3/uL (ref 0.0–0.5)
Eosinophils Relative: 2 %
HCT: 40.6 % (ref 36.0–46.0)
Hemoglobin: 13.3 g/dL (ref 12.0–15.0)
Immature Granulocytes: 0 %
Lymphocytes Relative: 28 %
Lymphs Abs: 1.5 10*3/uL (ref 0.7–4.0)
MCH: 31.7 pg (ref 26.0–34.0)
MCHC: 32.8 g/dL (ref 30.0–36.0)
MCV: 96.9 fL (ref 80.0–100.0)
Monocytes Absolute: 0.4 10*3/uL (ref 0.1–1.0)
Monocytes Relative: 8 %
Neutro Abs: 3.4 10*3/uL (ref 1.7–7.7)
Neutrophils Relative %: 62 %
Platelets: 196 10*3/uL (ref 150–400)
RBC: 4.19 MIL/uL (ref 3.87–5.11)
RDW: 11.9 % (ref 11.5–15.5)
WBC: 5.5 10*3/uL (ref 4.0–10.5)
nRBC: 0 % (ref 0.0–0.2)

## 2019-08-17 LAB — IRON AND TIBC
Iron: 73 ug/dL (ref 41–142)
Saturation Ratios: 30 % (ref 21–57)
TIBC: 244 ug/dL (ref 236–444)
UIBC: 171 ug/dL (ref 120–384)

## 2019-08-17 LAB — FERRITIN: Ferritin: 28 ng/mL (ref 11–307)

## 2019-08-17 LAB — RETICULOCYTES
Immature Retic Fract: 13.6 % (ref 2.3–15.9)
RBC.: 4.27 MIL/uL (ref 3.87–5.11)
Retic Count, Absolute: 64 10*3/uL (ref 19.0–186.0)
Retic Ct Pct: 1.5 % (ref 0.4–3.1)

## 2019-08-24 MED FILL — SERTRALINE HCL 50 MG TABLET: 50 | 30 days supply | Qty: 30 | Fill #1

## 2019-09-13 ENCOUNTER — Encounter: Payer: Self-pay | Admitting: Family Medicine

## 2019-09-13 ENCOUNTER — Ambulatory Visit (INDEPENDENT_AMBULATORY_CARE_PROVIDER_SITE_OTHER): Payer: 59 | Admitting: Family Medicine

## 2019-09-13 ENCOUNTER — Other Ambulatory Visit: Payer: Self-pay

## 2019-09-13 VITALS — BP 128/82 | HR 75 | Temp 98.1°F | Ht 64.0 in | Wt 263.6 lb

## 2019-09-13 DIAGNOSIS — E7800 Pure hypercholesterolemia, unspecified: Secondary | ICD-10-CM | POA: Diagnosis not present

## 2019-09-13 DIAGNOSIS — R0600 Dyspnea, unspecified: Secondary | ICD-10-CM | POA: Diagnosis not present

## 2019-09-13 DIAGNOSIS — M62838 Other muscle spasm: Secondary | ICD-10-CM | POA: Diagnosis not present

## 2019-09-13 DIAGNOSIS — I10 Essential (primary) hypertension: Secondary | ICD-10-CM

## 2019-09-13 DIAGNOSIS — F32A Depression, unspecified: Secondary | ICD-10-CM

## 2019-09-13 DIAGNOSIS — R7303 Prediabetes: Secondary | ICD-10-CM

## 2019-09-13 DIAGNOSIS — F329 Major depressive disorder, single episode, unspecified: Secondary | ICD-10-CM | POA: Diagnosis not present

## 2019-09-13 DIAGNOSIS — R0609 Other forms of dyspnea: Secondary | ICD-10-CM

## 2019-09-13 MED ORDER — OXYCODONE-ACETAMINOPHEN 5-325 MG PO TABS: 1.0000 | ORAL_TABLET | Freq: Two times a day (BID) | ORAL | 0 refills | Status: DC | PRN

## 2019-09-13 MED ORDER — SERTRALINE HCL 100 MG PO TABS: 100.0000 mg | ORAL_TABLET | Freq: Every day | ORAL | 1 refills | Status: DC

## 2019-09-13 MED ORDER — CYCLOBENZAPRINE HCL 10 MG PO TABS: ORAL_TABLET | ORAL | 1 refills | Status: DC

## 2019-09-13 MED ORDER — FUROSEMIDE 20 MG PO TABS: 20.0000 mg | ORAL_TABLET | Freq: Two times a day (BID) | ORAL | 4 refills | Status: DC | PRN

## 2019-09-13 MED FILL — FUROSEMIDE 20 MG TABS: 20 | 67 days supply | Qty: 135 | Fill #0

## 2019-09-13 MED FILL — OXYCODONE-ACETAMINOPHEN 5-3: 5-325 | 15 days supply | Qty: 30 | Fill #0

## 2019-09-13 MED FILL — SERTRALINE HCL 100 MG TAB: 100 | 90 days supply | Qty: 90 | Fill #0

## 2019-09-13 MED FILL — CYCLOBENZAPRINE HCL 10 MG T: 10 | 60 days supply | Qty: 180 | Fill #0

## 2019-09-13 NOTE — Progress Notes (Signed)
2/9/20211:58 PM  [name] 02-09-1956, 64 y.o., female [id number]  Chief Complaint  Patient presents with  . Shortness of Breath    follow up still becoming out of breath with movement  . Medication Refill    wanting medication in 90 day form    HPI:   Patient is a 64 y.o. female with past medical history significant for HTN, HLP, dCHF, OSA on cpap, morbid obesity, GERD, pre-diabetes, migraines, fibromyalgia, depression, chronic neck painwho presents today forroutine followup  Last OV nov 2020 - referred to pulm for DOE  She is not doing too well Feels that her depression has been getting worse, irritability, not focusing, anhedonia, wanting to spend all day Takes zoloft 50mg  and cymbalta 60mg  daily Has not been doing counseling Denies SI  She never heard from pulmonary for appt Requesting renewal of handicap parking ticket  Requesting refill of percocet and flexxeril which she takes very prn when neck pain flares up pmp reviewed, last rx 2019  Fibromyalgia stable on gabapentin and cymbalta  Struggling with weight loss due to [place] Not working on diet  Taking her meds as prescribed Denies any side effects  Lab Results  Component Value Date   HGBA1C 5.6 03/09/2019   HGBA1C 6.0 (H) 09/17/2018   HGBA1C 6.0 (H) 02/19/2018   Lab Results  Component Value Date   MICROALBUR 1.2 05/28/2015   LDLCALC 73 03/09/2019   CREATININE 0.93 05/31/2019   Wt Readings from Last 3 Encounters:  09/13/19 263 lb 9.6 oz (119.6 kg)  08/10/19 263 lb (119.3 kg)  07/25/19 262 lb 3.2 oz (118.9 kg)     Depression screen [facility] 2/9 09/13/2019 06/13/2019 03/14/2019  Decreased Interest 0 0 0  Down, Depressed, Hopeless 0 0 0  PHQ - 2 Score 0 0 0  Altered sleeping - - -  Tired, decreased energy - - -  Change in appetite - - -  Feeling bad or failure about yourself  - - -  Trouble concentrating - - -  Moving slowly or fidgety/restless - - -  Suicidal thoughts - - -  PHQ-9 Score - - -   Difficult doing work/chores - - -  Some recent data might be hidden    Fall Risk  09/13/2019 06/13/2019 03/14/2019 01/07/2019 11/12/2018  Falls in the past year? 0 0 0 0 0  Number falls in past yr: 0 0 0 0 0  Injury with Fall? 0 0 0 0 0  Comment - - - - -  Follow up - Falls evaluation completed - Falls evaluation completed -     Allergies  Allergen Reactions  . Sulfa Antibiotics Itching    Prior to Admission medications   Medication Sig Start Date End Date Taking? Authorizing Provider  Albuterol Sulfate (PROAIR RESPICLICK) 123XX123 (90 Base) MCG/ACT AEPB Inhale 2 puffs into the lungs 4 (four) times daily as needed. 12/10/17  Yes Rutherford Guys, MD  atorvastatin (LIPITOR) 10 MG tablet TAKE 1 TABLET BY MOUTH ONCE DAILY AT 6PM 07/15/19  Yes Rutherford Guys, MD  azelastine (ASTELIN) 0.1 % nasal spray Place 2 sprays into both nostrils 2 (two) times daily. Use in each nostril as directed 06/03/19  Yes McVey, Gelene Mink, PA-C  butalbital-acetaminophen-caffeine (FIORICET) [phone] MG tablet Take 1-2 tablets by mouth every 6 (six) hours as needed for headache. 03/14/19 03/13/20 Yes Rutherford Guys, MD  cyclobenzaprine (FLEXERIL) 10 MG tablet TAKE 1/2-1 TABLET BY MOUTH 3 TIMES DAILY AS NEEDED FOR MUSCLE SPASMS.  01/07/19  Yes Wendie Agreste, MD  DEXILANT 60 MG capsule TAKE 1 CAPSULE BY MOUTH ONCE DAILY 07/15/19  Yes Rutherford Guys, MD  DULoxetine (CYMBALTA) 60 MG capsule TAKE 1 CAPSULE BY MOUTH ONCE DAILY 07/15/19  Yes Rutherford Guys, MD  fluticasone [facility]) 50 MCG/ACT nasal spray Place 1 spray into both nostrils daily. 01/07/19  Yes Wendie Agreste, MD  furosemide (LASIX) 20 MG tablet Take 1 tablet (20 mg total) by mouth 2 (two) times daily as needed. 05/31/19  Yes Leonie Man, MD  gabapentin (NEURONTIN) 300 MG capsule TAKE 1-2 CAPSULES BY MOUTH FOUR TIMES DAILY 07/15/19  Yes Rutherford Guys, MD  lisinopril (ZESTRIL) 20 MG tablet TAKE 1 TABLET BY MOUTH ONCE DAILY 07/15/19  Yes Rutherford Guys, MD  ondansetron (ZOFRAN-ODT) 4 MG disintegrating tablet DISSOLVE 1 TABLET BY MOUTH EVERY 8 HOURS AS NEEDED FOR NAUSEA OR VOMITING. 01/07/19  Yes Wendie Agreste, MD  oxyCODONE-acetaminophen (PERCOCET/ROXICET) 5-325 MG tablet Take 1 tablet by mouth every 12 (twelve) hours as needed for severe pain. 06/14/18  Yes Rutherford Guys, MD  propranolol (INDERAL) 60 MG tablet TAKE 1 TABLET (60 MG TOTAL) BY MOUTH 3 (THREE) TIMES DAILY. 07/15/19  Yes Lendon Colonel, NP  sertraline (ZOLOFT) 50 MG tablet TAKE 1 TABLET BY MOUTH ONCE DAILY 07/15/19  Yes Rutherford Guys, MD  topiramate (TOPAMAX) 50 MG tablet Take 1 tablet (50 mg total) by mouth 2 (two) times daily. 01/07/19  Yes Wendie Agreste, MD    Past Medical History:  Diagnosis Date  . Allergy    [name], Flonase  . Anemia   . Anxiety   . Arthritis   . Chronic kidney disease   . Colon polyps   . Depression   . Fibromyalgia   . Gastritis   . GERD (gastroesophageal reflux disease)   . Hernia, hiatal   . HLD (hyperlipidemia)   . Hypertension   . Migraine   . Neuropathy   . Osteoporosis   . Pneumonia   . Sleep apnea    c-pap nightly  . Sleep apnea with use of continuous positive airway pressure (CPAP)   . Thyroid goiter     Past Surgical History:  Procedure Laterality Date  . 48-Hour Holter Monitor  06/2017   Mostly normal sinus rhythm.  Average heart rate 71 bpm.  Minimum 54 bpm.  Maximum sinus rate 114 bpm.  Rare PACs and PVCs.  No A. fib, atrial flutter, SVT or VT.  One brief run of 8 beat PAT.  [facility] ABDOMINAL HYSTERECTOMY     ovaries intact; DUB.  No dysplasia.  [facility] JOINT REPLACEMENT    . OPEN REDUCTION INTERNAL FIXATION (ORIF) DISTAL RADIAL FRACTURE Right 07/09/2017   Procedure: RIGHT WRIST OPEN REDUCTION INTERNAL FIXATION (ORIF) DISTAL RADIAL FRACTURE AND REPAIR AS INDICATED;  Surgeon: Iran Planas, MD;  Location: [facility];  Service: Orthopedics;  Laterality: Right;  . PARTIAL HYSTERECTOMY    . ROTATOR CUFF REPAIR Right 2009   . SHOULDER SURGERY Right    shoulder dislocation, fall, and elbow unla nerve  . SHOULDER SURGERY Right 2015   labral tear  . TRANSTHORACIC ECHOCARDIOGRAM  06/2017    EF 55-60%.  grade 1 diastolic dysfunction.  Otherwise normal  . TUBAL LIGATION    . ULNAR NERVE REPAIR Right 08/19/2014    Social History   Tobacco Use  . Smoking status: Former Smoker    Quit date: 08/04/1998    Years since quitting:  21.1  . Smokeless tobacco: Never Used  Substance Use Topics  . Alcohol use: No    Family History  Problem Relation Age of Onset  . Heart disease Mother 18       AMI/CAD/CHF as cause of death  . Hypertension Mother   . Hyperlipidemia Mother   . Hypothyroidism Mother   . Depression Mother   . Gout Mother   . Heart disease Father 81       AMI  . Hypertension Father   . Stroke Father 39       mild CVA  . Prostate cancer Father   . Hyperlipidemia Father   . Cancer Father 82       prostate cancer  . Hypertension Brother   . Hyperlipidemia Brother   . Cancer Brother        prostate cancer  . Cancer Maternal Grandmother        type unknown  . Cancer Maternal Grandfather        type unknown  . Heart disease Paternal Grandmother   . Heart defect Paternal Grandfather   . Melanoma Brother   . Hyperlipidemia Brother   . Hypertension Brother   . Cancer Brother        melanoma  . Hypertension Brother   . Hyperlipidemia Brother   . Melanoma Brother   . Cancer Brother 25       melanoma  . Cancer Sister        Basal cell carcinoma scalp  . Depression Sister   . Hypertension Brother   . Depression Brother   . Hypertension Brother   . Gout Brother   . Arthritis Sister   . Depression Sister   . Colon cancer Neg Hx   . Esophageal cancer Neg Hx   . Stomach cancer Neg Hx   . Rectal cancer Neg Hx     Review of Systems  Constitutional: Negative for chills and fever.  Respiratory: Positive for shortness of breath. Negative for cough.   Cardiovascular: Negative for chest  pain, palpitations and leg swelling.  Gastrointestinal: Negative for abdominal pain, nausea and vomiting.     OBJECTIVE:  Today's Vitals   09/13/19 1353  Pulse: 75  Temp: 98.1 F (36.7 C)  SpO2: 100%  Weight: 263 lb 9.6 oz (119.6 kg)  Height: 5\' 4"  (1.626 m)   Body mass index is 45.25 kg/m.   Physical Exam Vitals and nursing note reviewed.  Constitutional:      Appearance: She is well-developed.  HENT:     Head: Normocephalic and atraumatic.     Mouth/Throat:     Pharynx: No oropharyngeal exudate.  Eyes:     General: No scleral icterus.    Conjunctiva/sclera: Conjunctivae normal.     Pupils: Pupils are equal, round, and reactive to light.  Cardiovascular:     Rate and Rhythm: Normal rate and regular rhythm.     Heart sounds: Normal heart sounds. No murmur. No friction rub. No gallop.   Pulmonary:     Effort: Pulmonary effort is normal.     Breath sounds: Normal breath sounds. No wheezing or rales.  Musculoskeletal:     Cervical back: Neck supple.  Skin:    General: Skin is warm and dry.  Neurological:     Mental Status: She is alert and oriented to person, place, and time.     No results found for this or any previous visit (from the past 24 hour(s)).  No results found.  ASSESSMENT and PLAN  1. DOE (dyspnea on exertion) Provided patient with contact info so that she can schedule appt per referral made at nov office visit  2. Neck muscle spasm stable. Continue current regime.  - cyclobenzaprine (FLEXERIL) 10 MG tablet; TAKE 1/2-1 TABLET BY MOUTH 3 TIMES DAILY AS NEEDED FOR MUSCLE SPASMS.  3. Depression, unspecified depression type Not controlled. Increasing sertraline to 100mg  daily. Cont cymbalta. Reviewed r/se/b incl serotonin syndrome  4. Essential hypertension Controlled. Continue current regime.   5. Pure hypercholesterolemia Checking labs today, medications will be adjusted as needed.  - Comprehensive metabolic panel - Lipid panel  6.  Prediabetes Checking labs today, medications will be started as needed. Discussed weight loss strategies. - Hemoglobin A1c  Other orders - furosemide (LASIX) 20 MG tablet; Take 1 tablet (20 mg total) by mouth 2 (two) times daily as needed. - oxyCODONE-acetaminophen (PERCOCET/ROXICET) 5-325 MG tablet; Take 1 tablet by mouth every 12 (twelve) hours as needed for severe pain. - sertraline (ZOLOFT) 100 MG tablet; Take 1 tablet (100 mg total) by mouth daily.  Return in about 3 months (around 12/11/2019).  For depression/pulm     Rutherford Guys, MD [facility] [place], [place] [address] Ph.  [phone] Fax [phone]

## 2019-09-13 NOTE — Patient Instructions (Addendum)
  [facility]  Address: [address], [place], [place] [address] Phone: [phone]   If you have lab work done today you will be contacted with your lab results within the next 2 weeks.  If you have not heard from [place] then please contact us. The fastest way to get your results is to register for My Chart.   IF you received an x-ray today, you will receive an invoice from [facility]. Please contact [facility] at [phone] with questions or concerns regarding your invoice.   IF you received labwork today, you will receive an invoice from [facility]. Please contact [facility] at [phone] with questions or concerns regarding your invoice.   Our billing staff will not be able to assist you with questions regarding bills from these companies.  You will be contacted with the lab results as soon as they are available. The fastest way to get your results is to activate your My Chart account. Instructions are located on the last page of this paperwork. If you have not heard from [place] regarding the results in 2 weeks, please contact this office.

## 2019-09-14 ENCOUNTER — Ambulatory Visit: Payer: 59 | Admitting: Family Medicine

## 2019-09-14 LAB — COMPREHENSIVE METABOLIC PANEL
ALT: 12 IU/L (ref 0–32)
AST: 16 IU/L (ref 0–40)
Albumin/Globulin Ratio: 1.7 (ref 1.2–2.2)
Albumin: 4 g/dL (ref 3.8–4.8)
Alkaline Phosphatase: 70 IU/L (ref 39–117)
BUN/Creatinine Ratio: 8 — ABNORMAL LOW (ref 12–28)
BUN: 7 mg/dL — ABNORMAL LOW (ref 8–27)
Bilirubin Total: 0.2 mg/dL (ref 0.0–1.2)
CO2: 19 mmol/L — ABNORMAL LOW (ref 20–29)
Calcium: 9.2 mg/dL (ref 8.7–10.3)
Chloride: 113 mmol/L — ABNORMAL HIGH (ref 96–106)
Creatinine, Ser: 0.93 mg/dL (ref 0.57–1.00)
GFR calc Af Amer: 76 mL/min/{1.73_m2} (ref >59)
GFR calc non Af Amer: 66 mL/min/{1.73_m2} (ref >59)
Globulin, Total: 2.4 g/dL (ref 1.5–4.5)
Glucose: 83 mg/dL (ref 65–99)
Potassium: 4.3 mmol/L (ref 3.5–5.2)
Sodium: 144 mmol/L (ref 134–144)
Total Protein: 6.4 g/dL (ref 6.0–8.5)

## 2019-09-14 LAB — LIPID PANEL
Chol/HDL Ratio: 3 ratio (ref 0.0–4.4)
Cholesterol, Total: 172 mg/dL (ref 100–199)
HDL: 58 mg/dL (ref >39)
LDL Chol Calc (NIH): 89 mg/dL (ref 0–99)
Triglycerides: 145 mg/dL (ref 0–149)
VLDL Cholesterol Cal: 25 mg/dL (ref 5–40)

## 2019-09-14 LAB — HEMOGLOBIN A1C
Est. average glucose Bld gHb Est-mCnc: 108 mg/dL
Hgb A1c MFr Bld: 5.4 % (ref 4.8–5.6)

## 2019-10-05 ENCOUNTER — Encounter: Payer: Self-pay | Admitting: Pulmonary Disease

## 2019-10-05 ENCOUNTER — Other Ambulatory Visit: Payer: Self-pay

## 2019-10-05 ENCOUNTER — Ambulatory Visit (INDEPENDENT_AMBULATORY_CARE_PROVIDER_SITE_OTHER): Payer: 59 | Admitting: Pulmonary Disease

## 2019-10-05 VITALS — BP 94/60 | HR 75 | Temp 97.6°F | Ht 65.0 in | Wt 264.4 lb

## 2019-10-05 DIAGNOSIS — I5032 Chronic diastolic (congestive) heart failure: Secondary | ICD-10-CM

## 2019-10-05 DIAGNOSIS — R0600 Dyspnea, unspecified: Secondary | ICD-10-CM

## 2019-10-05 DIAGNOSIS — R0609 Other forms of dyspnea: Secondary | ICD-10-CM

## 2019-10-05 NOTE — Progress Notes (Signed)
Synopsis: Referred in August 2019 for dyspnea on exertion by Rutherford Guys, MD  Subjective:   PATIENT ID: [name] GENDER: female DOB: 21-Dec-1955, MRN: [id number]  Chief Complaint  Patient presents with   Follow-up    Patient is here for shortness of breath with exertion. Patient states that she has had it but it has got worse. Patient was told she has an enlarged heart and CHF. Patient also has history of anemia.    She has had breathing trouble for the past few months. She had trouble since July. She has noticed that she has had increased swelling. Her feet and legs have been swollen for some time. She was seen by her PCP for this back in June due to wheezing. She as diagnosed with asthma this time. She was started on albuterol and it helps her symptoms.  She does have shortness of breath associated with bending over (bendapnea)  She has had two significant falls at home with many orthopedic injuries. She had another fall back in December. Do to the fall history she has slowed down mainly due to the fear of falling. Currently not as active as she was. 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   She has allergies throughout the year off and on, coughing, sneezing, running nose. Never had hives. Never had allergy testing. Never diagnosed with heart trouble.   OV 04/27/2018: Since her last office visit patient states her dyspnea on exertion and shortness of breath is approximately the same.  She has noticed knee pain with walking.  She is trying to be more active.  She is getting out of the house during the day and trying to visit her sister.  She has noticed changes in her sleep habits however and the fact that she is sleeping most of the day sometimes up until lunchtime.  She does have sleep apnea and wears her CPAP regularly.  Her husband works nights which also affects her schedule during the day due to this.  PFTs were completed today in the office  prior to visit.  She denies chest pain, shortness of breath at rest, nausea vomiting, cough or daily sputum production.  OV 10/05/2019: Here today for follow-up.  Still has ongoing dyspnea on exertion.  Saw primary care February 2021. She is still having trouble breathing with exertion. She has not really been doing much from the standpoint of exercise. She remains relatively inactive. She feels like she has has trouble doing her regular ADLs. She complains today of knee pain.    Past Medical History:  Diagnosis Date   Allergy    [name], Flonase   Anemia    Anxiety    Arthritis    Chronic kidney disease    Colon polyps    Depression    Fibromyalgia    Gastritis    GERD (gastroesophageal reflux disease)    Hernia, hiatal    HLD (hyperlipidemia)    Hypertension    Migraine    Neuropathy    Osteoporosis    Pneumonia    Sleep apnea    c-pap nightly   Sleep apnea with use of continuous positive airway pressure (CPAP)    Thyroid goiter      Family History  Problem Relation Age of Onset   Heart disease Mother 11       AMI/CAD/CHF as cause of death   Hypertension Mother    Hyperlipidemia Mother    Hypothyroidism  Mother    Depression Mother    Gout Mother    Heart disease Father 48       AMI   Hypertension Father    Stroke Father 57       mild CVA   Prostate cancer Father    Hyperlipidemia Father    Cancer Father 12       prostate cancer   Hypertension Brother    Hyperlipidemia Brother    Cancer Brother        prostate cancer   Cancer Maternal Grandmother        type unknown   Cancer Maternal Grandfather        type unknown   Heart disease Paternal Grandmother    Heart defect Paternal Grandfather    Melanoma Brother    Hyperlipidemia Brother    Hypertension Brother    Cancer Brother        melanoma   Hypertension Brother    Hyperlipidemia Brother    Melanoma Brother    Cancer Brother 88       melanoma   Cancer  Sister        Basal cell carcinoma scalp   Depression Sister    Hypertension Brother    Depression Brother    Hypertension Brother    Gout Brother    Arthritis Sister    Depression Sister    Colon cancer Neg Hx    Esophageal cancer Neg Hx    Stomach cancer Neg Hx    Rectal cancer Neg Hx      Social History   Socioeconomic History   Marital status: Married    Spouse name: Not on file   Number of children: 3   Years of education: Not on file   Highest education level: Not on file  Occupational History   Occupation: unemployed  Tobacco Use   Smoking status: Former Smoker    Quit date: 08/04/1998    Years since quitting: 21.1   Smokeless tobacco: Never Used  Substance and Sexual Activity   Alcohol use: No   Drug use: No   Sexual activity: Yes    Birth control/protection: Surgical, Post-menopausal  Other Topics Concern   Not on file  Social History Narrative   Marital status: married x 20 years; moderately happy     Children: 3 children (47 [name], 72 Dianna, 40 April); 8 grandchildren; 0 gg     Lives: with husband/Steve, April, 2 granddaughters     Employment:  Unemployed; quit working 2015 with work related injury; awaiting disability in 2018; disability approved in 02/2017.       Tobacco: quit smoking in 2016; smoked x 2 years.      Alcohol: none      Drugs: none      Exercise: rarely in 2018         Social Determinants of Health   Financial Resource Strain:    Difficulty of Paying Living Expenses: Not on file  Food Insecurity:    Worried About Charity fundraiser in the Last Year: Not on file   [employer] of Food in the Last Year: Not on file  Transportation Needs:    Lack of Transportation (Medical): Not on file   Lack of Transportation (Non-Medical): Not on file  Physical Activity:    Days of Exercise per Week: Not on file   Minutes of Exercise per Session: Not on file  Stress:    Feeling of Stress : Not on file  Social  Connections:    Frequency of Communication with Friends and Family: Not on file   Frequency of Social Gatherings with Friends and Family: Not on file   Attends Religious Services: Not on Electrical engineer or Organizations: Not on file   Attends Archivist Meetings: Not on file   Marital Status: Not on file  Intimate Partner Violence:    Fear of Current or Ex-Partner: Not on file   Emotionally Abused: Not on file   Physically Abused: Not on file   Sexually Abused: Not on file     Allergies  Allergen Reactions   Sulfa Antibiotics Itching     Outpatient Medications Prior to Visit  Medication Sig Dispense Refill   Albuterol Sulfate (PROAIR RESPICLICK) 123XX123 (90 Base) MCG/ACT AEPB Inhale 2 puffs into the lungs 4 (four) times daily as needed. 1 each 1   atorvastatin (LIPITOR) 10 MG tablet TAKE 1 TABLET BY MOUTH ONCE DAILY AT 6PM 90 tablet 1   azelastine (ASTELIN) 0.1 % nasal spray Place 2 sprays into both nostrils 2 (two) times daily. Use in each nostril as directed 30 mL 12   butalbital-acetaminophen-caffeine (FIORICET) 50-325-40 MG tablet Take 1-2 tablets by mouth every 6 (six) hours as needed for headache. 40 tablet 0   cyclobenzaprine (FLEXERIL) 10 MG tablet TAKE 1/2-1 TABLET BY MOUTH 3 TIMES DAILY AS NEEDED FOR MUSCLE SPASMS. 180 tablet 1   DEXILANT 60 MG capsule TAKE 1 CAPSULE BY MOUTH ONCE DAILY 90 capsule 1   DULoxetine (CYMBALTA) 60 MG capsule TAKE 1 CAPSULE BY MOUTH ONCE DAILY 90 capsule 1   fluticasone (FLONASE) 50 MCG/ACT nasal spray Place 1 spray into both nostrils daily. 16 g 6   furosemide (LASIX) 20 MG tablet Take 1 tablet (20 mg total) by mouth 2 (two) times daily as needed. 135 tablet 4   gabapentin (NEURONTIN) 300 MG capsule TAKE 1-2 CAPSULES BY MOUTH FOUR TIMES DAILY 540 capsule 1   lisinopril (ZESTRIL) 20 MG tablet TAKE 1 TABLET BY MOUTH ONCE DAILY 90 tablet 1   ondansetron (ZOFRAN-ODT) 4 MG disintegrating tablet DISSOLVE 1  TABLET BY MOUTH EVERY 8 HOURS AS NEEDED FOR NAUSEA OR VOMITING. 20 tablet 4   oxyCODONE-acetaminophen (PERCOCET/ROXICET) 5-325 MG tablet Take 1 tablet by mouth every 12 (twelve) hours as needed for severe pain. 30 tablet 0   propranolol (INDERAL) 60 MG tablet TAKE 1 TABLET (60 MG TOTAL) BY MOUTH 3 (THREE) TIMES DAILY. 270 tablet 2   sertraline (ZOLOFT) 100 MG tablet Take 1 tablet (100 mg total) by mouth daily. 90 tablet 1   topiramate (TOPAMAX) 50 MG tablet Take 1 tablet (50 mg total) by mouth 2 (two) times daily. 180 tablet 1   No facility-administered medications prior to visit.    Review of Systems  Constitutional: Negative for chills, fever, malaise/fatigue and weight loss.       Weight gain  HENT: Negative for hearing loss, sore throat and tinnitus.   Eyes: Negative for blurred vision and double vision.  Respiratory: Positive for shortness of breath. Negative for cough, hemoptysis, sputum production, wheezing and stridor.   Cardiovascular: Negative for chest pain, palpitations, orthopnea, leg swelling and PND.  Gastrointestinal: Negative for abdominal pain, constipation, diarrhea, heartburn, nausea and vomiting.  Genitourinary: Negative for dysuria, hematuria and urgency.  Musculoskeletal: Negative for joint pain and myalgias.  Skin: Negative for itching and rash.  Neurological: Negative for dizziness, tingling, weakness and headaches.  Endo/Heme/Allergies: Negative for environmental allergies.  Does not bruise/bleed easily.  Psychiatric/Behavioral: Negative for depression. The patient is not nervous/anxious and does not have insomnia.   All other systems reviewed and are negative.    Objective:  Physical Exam Vitals reviewed.  Constitutional:      General: She is not in acute distress.    Appearance: She is well-developed. She is obese.  HENT:     Head: Normocephalic and atraumatic.  Eyes:     General: No scleral icterus.    Conjunctiva/sclera: Conjunctivae normal.      Pupils: Pupils are equal, round, and reactive to light.  Neck:     Vascular: No JVD.     Trachea: No tracheal deviation.  Cardiovascular:     Rate and Rhythm: Normal rate and regular rhythm.     Heart sounds: Normal heart sounds. No murmur.  Pulmonary:     Effort: Pulmonary effort is normal. No tachypnea, accessory muscle usage or respiratory distress.     Breath sounds: Normal breath sounds. No stridor. No wheezing, rhonchi or rales.  Abdominal:     General: Bowel sounds are normal. There is no distension.     Palpations: Abdomen is soft.     Tenderness: There is no abdominal tenderness.     Comments: Obese pannus  Musculoskeletal:        General: No tenderness.     Cervical back: Neck supple.     Right lower leg: No edema.     Left lower leg: No edema.  Lymphadenopathy:     Cervical: No cervical adenopathy.  Skin:    General: Skin is warm and dry.     Capillary Refill: Capillary refill takes less than 2 seconds.     Findings: No rash.  Neurological:     Mental Status: She is alert and oriented to person, place, and time.  Psychiatric:        Behavior: Behavior normal.      Vitals:   10/05/19 1008  BP: 94/60  Pulse: 75  Temp: 97.6 F (36.4 C)  TempSrc: Temporal  SpO2: 92%  Weight: 264 lb 6.4 oz (119.9 kg)  Height: 5\' 5"  (1.651 m)   92% on RA  BMI Readings from Last 3 Encounters:  10/05/19 44.00 kg/m  09/13/19 45.25 kg/m  08/10/19 45.14 kg/m   Wt Readings from Last 3 Encounters:  10/05/19 264 lb 6.4 oz (119.9 kg)  09/13/19 263 lb 9.6 oz (119.6 kg)  08/10/19 263 lb (119.3 kg)     CBC    Component Value Date/Time   WBC 5.5 08/17/2019 1355   RBC 4.27 08/17/2019 1356   RBC 4.19 08/17/2019 1355   HGB 13.3 08/17/2019 1355   HGB 11.2 11/10/2018 1137   HCT 40.6 08/17/2019 1355   HCT 37.2 11/10/2018 1137   PLT 196 08/17/2019 1355   PLT 260 11/10/2018 1137   MCV 96.9 08/17/2019 1355   MCV 85 11/10/2018 1137   MCH 31.7 08/17/2019 1355   MCHC 32.8  08/17/2019 1355   RDW 11.9 08/17/2019 1355   RDW 23.6 (H) 11/10/2018 1137   LYMPHSABS 1.5 08/17/2019 1355   LYMPHSABS 1.7 11/10/2018 1137   MONOABS 0.4 08/17/2019 1355   EOSABS 0.1 08/17/2019 1355   EOSABS 0.1 11/10/2018 1137   BASOSABS 0.0 08/17/2019 1355   BASOSABS 0.0 11/10/2018 1137    Chest Imaging: 02/22/2018 CT of the chest reviewed with evidence of pulmonary edema as well as small pericardial effusion. The patient's images have been independently reviewed by me.  Pulmonary Functions Testing Results: 04/27/2018: Post bronchial dilator response was recorded FVC 2.7 L, 97% predicted, FEV1 2.34 L, 101% predicted, Ratio 85, no significant bronc dilator response, TLC 4.75 L, 111% predicted, DLCO 85% predicted.  FeNO: None  Pathology: None  Echocardiogram:  06/24/2017 Study Conclusions  - Left ventricle: The cavity size was normal. Wall thickness was   normal. Systolic function was normal. The estimated ejection   fraction was in the range of 55% to 60%. Wall motion was normal;   there were no regional wall motion abnormalities. Doppler   parameters are consistent with abnormal left ventricular   relaxation (grade 1 diastolic dysfunction). The E/e&' ratio is   between 8-15, suggesting indeterminate LV filling pressure. - Left atrium: The atrium was normal in size. - Inferior vena cava: The vessel was normal in size. The   respirophasic diameter changes were in the normal range (>= 50%),   consistent with normal central venous pressure.  Impressions:  - LVEF 55-60%, normal wall thickness, normal wall motion, grade 1   DD, indeterminate LV filling pressure, normal LA size, normal   IVC.  Heart Catheterization: None     Assessment & Plan:   DOE (dyspnea on exertion)  Obesity, morbid, BMI 40.0-49.9 (HCC)  Chronic diastolic heart failure ([place])   63 year old morbidly obese female BMI 44, normal spirometry, reduced ERV 4% likely consistent with BMI.  History  of obstructive sleep apnea on CPAP.  Not following with anyone regularly.  Also has chronic diastolic heart failure.  Also seen by cardiology.  Both agree that lots of her dyspnea on exertion is related to body habitus and lack of physical activity.  Per patient she is relatively inactive all day long.  She states that she felt the best she ever had when she weighed 150 pounds.  A lot of the portion of the visit today was discussing weight loss management techniques.  I also reviewed with her cardiology's recommendations, as well as her chest x-ray imaging.   Plan Following Extensive Data Review & Interpretation:   I reviewed prior external note(s) from 05/11/2019 Dr. Ellyn Hack cardiology, also felt as if patient's BMI is a major role in dyspnea.  I reviewed the result(s) of 10/05/2018 echocardiogram normal ejection fraction, normal right ventricular function.  March 2020 coronary CT enlarged pulmonary artery trunks concerning for pulmonary hypertension.  I have ordered referral to sleep.  We will get her established in our sleep clinic care.  She currently is on CPAP but has not had this checked in several years.  Independent interpretation of tests  Review of patient's 10/08/2018 chest x-ray images revealed hiatal hernia peribronchial thickening.  No infiltrate.  No emphysema. The patient's images have been independently reviewed by me.    Patient to follow-up with [place] in pulmonary as needed.  Needs to establish care with sleep clinic.   Current Outpatient Medications:    Albuterol Sulfate (PROAIR RESPICLICK) 123XX123 (90 Base) MCG/ACT AEPB, Inhale 2 puffs into the lungs 4 (four) times daily as needed., Disp: 1 each, Rfl: 1   atorvastatin (LIPITOR) 10 MG tablet, TAKE 1 TABLET BY MOUTH ONCE DAILY AT 6PM, Disp: 90 tablet, Rfl: 1   azelastine (ASTELIN) 0.1 % nasal spray, Place 2 sprays into both nostrils 2 (two) times daily. Use in each nostril as directed, Disp: 30 mL, Rfl: 12    butalbital-acetaminophen-caffeine (FIORICET) 50-325-40 MG tablet, Take 1-2 tablets by mouth every 6 (six) hours as needed for headache., Disp: 40 tablet, Rfl: 0  cyclobenzaprine (FLEXERIL) 10 MG tablet, TAKE 1/2-1 TABLET BY MOUTH 3 TIMES DAILY AS NEEDED FOR MUSCLE SPASMS., Disp: 180 tablet, Rfl: 1   DEXILANT 60 MG capsule, TAKE 1 CAPSULE BY MOUTH ONCE DAILY, Disp: 90 capsule, Rfl: 1   DULoxetine (CYMBALTA) 60 MG capsule, TAKE 1 CAPSULE BY MOUTH ONCE DAILY, Disp: 90 capsule, Rfl: 1   fluticasone (FLONASE) 50 MCG/ACT nasal spray, Place 1 spray into both nostrils daily., Disp: 16 g, Rfl: 6   furosemide (LASIX) 20 MG tablet, Take 1 tablet (20 mg total) by mouth 2 (two) times daily as needed., Disp: 135 tablet, Rfl: 4   gabapentin (NEURONTIN) 300 MG capsule, TAKE 1-2 CAPSULES BY MOUTH FOUR TIMES DAILY, Disp: 540 capsule, Rfl: 1   lisinopril (ZESTRIL) 20 MG tablet, TAKE 1 TABLET BY MOUTH ONCE DAILY, Disp: 90 tablet, Rfl: 1   ondansetron (ZOFRAN-ODT) 4 MG disintegrating tablet, DISSOLVE 1 TABLET BY MOUTH EVERY 8 HOURS AS NEEDED FOR NAUSEA OR VOMITING., Disp: 20 tablet, Rfl: 4   oxyCODONE-acetaminophen (PERCOCET/ROXICET) 5-325 MG tablet, Take 1 tablet by mouth every 12 (twelve) hours as needed for severe pain., Disp: 30 tablet, Rfl: 0   propranolol (INDERAL) 60 MG tablet, TAKE 1 TABLET (60 MG TOTAL) BY MOUTH 3 (THREE) TIMES DAILY., Disp: 270 tablet, Rfl: 2   sertraline (ZOLOFT) 100 MG tablet, Take 1 tablet (100 mg total) by mouth daily., Disp: 90 tablet, Rfl: 1   topiramate (TOPAMAX) 50 MG tablet, Take 1 tablet (50 mg total) by mouth 2 (two) times daily., Disp: 180 tablet, Rfl: 1   Garner Nash, DO [facility] 10/05/2019 10:34 AM

## 2019-10-05 NOTE — Patient Instructions (Addendum)
Thank you for visiting Dr. Valeta Harms at [facility]. Today we recommend the following:  We will get you set up with sleep physician.  Please set patient up with Dr. Ander Slade for next available sleep consultation.  Return in about 1 year (around 10/04/2020) for with APP. or as needed    Please do your part to reduce the spread of COVID-19.

## 2019-10-13 ENCOUNTER — Other Ambulatory Visit: Payer: Self-pay | Admitting: Family Medicine

## 2019-10-13 DIAGNOSIS — G43909 Migraine, unspecified, not intractable, without status migrainosus: Secondary | ICD-10-CM

## 2019-10-13 MED FILL — PROPRANOLOL 60 MG TABLET: 60 | 90 days supply | Qty: 270 | Fill #1

## 2019-10-13 MED FILL — DULoxetine HCL 60 MG CPEP: 60 | 90 days supply | Qty: 90 | Fill #1

## 2019-10-13 MED FILL — LISINOPRIL 20 MG TABLET: 20 | 90 days supply | Qty: 90 | Fill #1

## 2019-10-13 MED FILL — ATORVASTATIN 10 MG TABLET: 10 | 90 days supply | Qty: 90 | Fill #1

## 2019-10-13 MED FILL — TOPIRAMATE 50 MG TABLET: 50 | 90 days supply | Qty: 180 | Fill #0

## 2019-10-13 MED FILL — GABAPENTIN 300 MG CAPSULE: 300 | 68 days supply | Qty: 540 | Fill #1

## 2019-10-13 MED FILL — DEXILANT DR 60 MG CAPSULE: 60 | 90 days supply | Qty: 90 | Fill #1

## 2019-10-13 MED FILL — ONDANSETRON ODT 4 MG TABLET: 4 | 7 days supply | Qty: 20 | Fill #4

## 2019-10-13 NOTE — Telephone Encounter (Signed)
Requested medication (s) are due for refill today: yes  Requested medication (s) are on the active medication list: yes  Last refill:  06/20/2019  Future visit scheduled: yes  Notes to clinic:  not delegated   Requested Prescriptions  Pending Prescriptions Disp Refills   topiramate (TOPAMAX) 50 MG tablet [Pharmacy Med Name: TOPIRAMATE 50 MG TABLET 50 Tablet] 180 tablet 1    Sig: TAKE 1 TABLET BY MOUTH TWICE DAILY      Not Delegated - Neurology: Anticonvulsants - topiramate & zonisamide Failed - 10/13/2019  8:33 AM      Failed - This refill cannot be delegated      Failed - CO2 in normal range and within 360 days    CO2  Date Value Ref Range Status  09/13/2019 19 (L) 20 - 29 mmol/L Final          Passed - Cr in normal range and within 360 days    Creat  Date Value Ref Range Status  05/21/2016 1.01 (H) 0.50 - 0.99 mg/dL Final    Comment:      For patients > or = 64 years of age: The upper reference limit for Creatinine is approximately 13% higher for people identified as African-American.      Creatinine, Ser  Date Value Ref Range Status  09/13/2019 0.93 0.57 - 1.00 mg/dL Final          Passed - Valid encounter within last 12 months    Recent Outpatient Visits           1 month ago DOE (dyspnea on exertion)   [facility], Lilia Argue, MD   4 months ago Dyspnea on exertion   [facility], Lilia Argue, MD   7 months ago Iron deficiency anemia due to chronic blood loss   [facility], Lilia Argue, MD   7 months ago Iron deficiency anemia, unspecified iron deficiency anemia type   [facility], MD   9 months ago Prediabetes   [facility], Ranell Patrick, MD       Future Appointments             In 2 months Rutherford Guys, MD [facility] [place], [facility]

## 2019-10-18 ENCOUNTER — Institutional Professional Consult (permissible substitution): Payer: 59 | Admitting: Pulmonary Disease

## 2019-10-27 ENCOUNTER — Other Ambulatory Visit: Payer: Self-pay

## 2019-10-27 ENCOUNTER — Encounter: Payer: Self-pay | Admitting: Pulmonary Disease

## 2019-10-27 ENCOUNTER — Ambulatory Visit (INDEPENDENT_AMBULATORY_CARE_PROVIDER_SITE_OTHER): Payer: 59 | Admitting: Pulmonary Disease

## 2019-10-27 VITALS — BP 140/80 | Temp 97.3°F | Ht 65.0 in | Wt 263.8 lb

## 2019-10-27 DIAGNOSIS — G4733 Obstructive sleep apnea (adult) (pediatric): Secondary | ICD-10-CM | POA: Diagnosis not present

## 2019-10-27 DIAGNOSIS — Z9989 Dependence on other enabling machines and devices: Secondary | ICD-10-CM

## 2019-10-27 NOTE — Progress Notes (Signed)
[name]    [id number]    1956-03-25  Primary Care Physician:Santiago, Lilia Argue, MD  Referring Physician: Rutherford Guys, MD [address] [place],  [place] [address]  Chief complaint:   Obstructive sleep apnea  HPI:  Patient diagnosed with obstructive sleep apnea in 2012 Has been using CPAP since then Continues to tolerate CPAP well without significant concerns  Has had some health issues recently, fall, wrist injury that made her start sleeping in a recliner  She feels she sleeps well at night Wakes up in the morning feeling like she is at a good nights rest  She is currently not having any issues with her CPAP  She does skip nights sometimes  Currently owns her own machine, she gets her supplies online  Has been following up with Dr. Valeta Harms for dyspnea on exertion History of diastolic heart failure Appears to have significant deconditioning contributing to symptoms as well   Outpatient Encounter Medications as of 10/27/2019  Medication Sig  . Albuterol Sulfate (PROAIR RESPICLICK) 123XX123 (90 Base) MCG/ACT AEPB Inhale 2 puffs into the lungs 4 (four) times daily as needed.  Marland Kitchen atorvastatin (LIPITOR) 10 MG tablet TAKE 1 TABLET BY MOUTH ONCE DAILY AT 6PM  . azelastine (ASTELIN) 0.1 % nasal spray Place 2 sprays into both nostrils 2 (two) times daily. Use in each nostril as directed  . butalbital-acetaminophen-caffeine (FIORICET) 50-325-40 MG tablet Take 1-2 tablets by mouth every 6 (six) hours as needed for headache.  . cyclobenzaprine (FLEXERIL) 10 MG tablet TAKE 1/2-1 TABLET BY MOUTH 3 TIMES DAILY AS NEEDED FOR MUSCLE SPASMS.  Marland Kitchen DEXILANT 60 MG capsule TAKE 1 CAPSULE BY MOUTH ONCE DAILY  . DULoxetine (CYMBALTA) 60 MG capsule TAKE 1 CAPSULE BY MOUTH ONCE DAILY  . fluticasone (FLONASE) 50 MCG/ACT nasal spray Place 1 spray into both nostrils daily.  . furosemide (LASIX) 20 MG tablet Take 1 tablet (20 mg total) by mouth 2 (two) times daily as needed.  . gabapentin  (NEURONTIN) 300 MG capsule TAKE 1-2 CAPSULES BY MOUTH FOUR TIMES DAILY  . lisinopril (ZESTRIL) 20 MG tablet TAKE 1 TABLET BY MOUTH ONCE DAILY  . ondansetron (ZOFRAN-ODT) 4 MG disintegrating tablet DISSOLVE 1 TABLET BY MOUTH EVERY 8 HOURS AS NEEDED FOR NAUSEA OR VOMITING.  Marland Kitchen oxyCODONE-acetaminophen (PERCOCET/ROXICET) 5-325 MG tablet Take 1 tablet by mouth every 12 (twelve) hours as needed for severe pain.  Marland Kitchen propranolol (INDERAL) 60 MG tablet TAKE 1 TABLET (60 MG TOTAL) BY MOUTH 3 (THREE) TIMES DAILY.  Marland Kitchen sertraline (ZOLOFT) 100 MG tablet Take 1 tablet (100 mg total) by mouth daily.  Marland Kitchen topiramate (TOPAMAX) 50 MG tablet TAKE 1 TABLET BY MOUTH TWICE DAILY   No facility-administered encounter medications on file as of 10/27/2019.    Allergies as of 10/27/2019 - Review Complete 10/27/2019  Allergen Reaction Noted  . Sulfa antibiotics Itching 01/25/2014    Past Medical History:  Diagnosis Date  . Allergy    [name], Flonase  . Anemia   . Anxiety   . Arthritis   . Chronic kidney disease   . Colon polyps   . Depression   . Fibromyalgia   . Gastritis   . GERD (gastroesophageal reflux disease)   . Hernia, hiatal   . High risk for colon cancer   . HLD (hyperlipidemia)   . Hypertension   . Migraine   . Neuropathy   . Osteoporosis   . Pneumonia   . Sleep apnea  c-pap nightly  . Sleep apnea with use of continuous positive airway pressure (CPAP)   . Thyroid goiter     Past Surgical History:  Procedure Laterality Date  . 48-Hour Holter Monitor  06/2017   Mostly normal sinus rhythm.  Average heart rate 71 bpm.  Minimum 54 bpm.  Maximum sinus rate 114 bpm.  Rare PACs and PVCs.  No A. fib, atrial flutter, SVT or VT.  One brief run of 8 beat PAT.  Marland Kitchen ABDOMINAL HYSTERECTOMY     ovaries intact; DUB.  No dysplasia.  Marland Kitchen JOINT REPLACEMENT    . OPEN REDUCTION INTERNAL FIXATION (ORIF) DISTAL RADIAL FRACTURE Right 07/09/2017   Procedure: RIGHT WRIST OPEN REDUCTION INTERNAL FIXATION (ORIF) DISTAL  RADIAL FRACTURE AND REPAIR AS INDICATED;  Surgeon: Iran Planas, MD;  Location: [facility];  Service: Orthopedics;  Laterality: Right;  . PARTIAL HYSTERECTOMY    . ROTATOR CUFF REPAIR Right 2009  . SHOULDER SURGERY Right    shoulder dislocation, fall, and elbow unla nerve  . SHOULDER SURGERY Right 2015   labral tear  . TRANSTHORACIC ECHOCARDIOGRAM  06/2017    EF 55-60%.  grade 1 diastolic dysfunction.  Otherwise normal  . TUBAL LIGATION    . ULNAR NERVE REPAIR Right 08/19/2014    Family History  Problem Relation Age of Onset  . Heart disease Mother 75       AMI/CAD/CHF as cause of death  . Hypertension Mother   . Hyperlipidemia Mother   . Hypothyroidism Mother   . Depression Mother   . Gout Mother   . Heart disease Father 31       AMI  . Hypertension Father   . Stroke Father 104       mild CVA  . Prostate cancer Father   . Hyperlipidemia Father   . Cancer Father 68       prostate cancer  . Hypertension Brother   . Hyperlipidemia Brother   . Cancer Brother        prostate cancer  . Cancer Maternal Grandmother        type unknown  . Cancer Maternal Grandfather        type unknown  . Heart disease Paternal Grandmother   . Heart defect Paternal Grandfather   . Melanoma Brother   . Hyperlipidemia Brother   . Hypertension Brother   . Cancer Brother        melanoma  . Hypertension Brother   . Hyperlipidemia Brother   . Melanoma Brother   . Cancer Brother 25       melanoma  . Cancer Sister        Basal cell carcinoma scalp  . Depression Sister   . Hypertension Brother   . Depression Brother   . Hypertension Brother   . Gout Brother   . Arthritis Sister   . Depression Sister   . Colon cancer Neg Hx   . Esophageal cancer Neg Hx   . Stomach cancer Neg Hx   . Rectal cancer Neg Hx     Social History   Socioeconomic History  . Marital status: Married    Spouse name: Not on file  . Number of children: 3  . Years of education: Not on file  . Highest education  level: Not on file  Occupational History  . Occupation: unemployed  Tobacco Use  . Smoking status: Former Smoker    Quit date: 08/04/1998    Years since quitting: 21.2  . Smokeless tobacco: Never  Used  Substance and Sexual Activity  . Alcohol use: No  . Drug use: No  . Sexual activity: Yes    Birth control/protection: Surgical, Post-menopausal  Other Topics Concern  . Not on file  Social History Narrative   Marital status: married x 20 years; moderately happy     Children: 3 children (47 [name], 66 Dianna, 40 April); 8 grandchildren; 0 gg     Lives: with husband/Steve, April, 2 granddaughters     Employment:  Unemployed; quit working 2015 with work related injury; awaiting disability in 2018; disability approved in 02/2017.       Tobacco: quit smoking in 2016; smoked x 2 years.      Alcohol: none      Drugs: none      Exercise: rarely in 2018         Social Determinants of Health   Financial Resource Strain:   . Difficulty of Paying Living Expenses:   Food Insecurity:   . Worried About Charity fundraiser in the Last Year:   . Arboriculturist in the Last Year:   Transportation Needs:   . Film/video editor (Medical):   Marland Kitchen Lack of Transportation (Non-Medical):   Physical Activity:   . Days of Exercise per Week:   . Minutes of Exercise per Session:   Stress:   . Feeling of Stress :   Social Connections:   . Frequency of Communication with Friends and Family:   . Frequency of Social Gatherings with Friends and Family:   . Attends Religious Services:   . Active Member of Clubs or Organizations:   . Attends Archivist Meetings:   Marland Kitchen Marital Status:   Intimate Partner Violence:   . Fear of Current or Ex-Partner:   . Emotionally Abused:   Marland Kitchen Physically Abused:   . Sexually Abused:     Review of Systems  Constitutional: Negative for fatigue.  Respiratory: Positive for apnea and shortness of breath.   Psychiatric/Behavioral: Positive for sleep disturbance.     Vitals:   10/27/19 1004  BP: 140/80  Temp: (!) 97.3 F (36.3 C)  SpO2: 97%   Physical Exam  Constitutional: She appears well-developed.  HENT:  Head: Normocephalic and atraumatic.  Mallampati 3, crowded oropharynx  Eyes: Pupils are equal, round, and reactive to light.  Neck: No tracheal deviation present. No thyromegaly present.  Cardiovascular: Normal rate and regular rhythm.  Pulmonary/Chest: Effort normal and breath sounds normal. No respiratory distress. She has no wheezes. She has no rales. She exhibits no tenderness.  Musculoskeletal:        General: No edema. Normal range of motion.     Cervical back: Normal range of motion and neck supple.  Neurological: She is alert.  Skin: Skin is warm and dry.  Psychiatric: She has a normal mood and affect.   Results of the Epworth flowsheet 10/27/2019  Sitting and reading 1  Watching TV 1  Sitting, inactive in a public place (e.g. a theatre or a meeting) 2  As a passenger in a car for an hour without a break 1  Lying down to rest in the afternoon when circumstances permit 1  Sitting and talking to someone 1  Sitting quietly after a lunch without alcohol 2  In a car, while stopped for a few minutes in traffic 0  Total score 9    Data Reviewed: Compliance data reviewed showing 57% compliance, machine set at 8, residual AHI of 4.9.  No significant mask leak.  Average usage on days used 6 hours 10 minutes  Assessment:  Obstructive sleep apnea -Severity is unknown  Dyspnea on exertion -Following up regularly  Deconditioning -She is about to start an exercise program  Excessive daytime sleepiness  Plan/Recommendations: Encouraged to continue using CPAP on a regular basis Nightly use of CPAP encouraged Weight loss measures encouraged We will contact [facility] to get a copy of her initial sleep study  No changes need made to the CPAP itself at present She feels machine still works well She gets her supplies  online regularly  Follow-up in 6 months  Sherrilyn Rist MD [facility] 10/27/2019, 10:18 AM  CC: Rutherford Guys, MD

## 2019-10-27 NOTE — Patient Instructions (Signed)
Obstructive sleep apnea -Well managed with CPAP at present  -Continue using CPAP on a regular basis -Encourage nightly use of CPAP  Weight loss efforts  I will see you back in about 6 months Call with significant concerns Bring your smart card whenever you come in for visits

## 2019-11-15 ENCOUNTER — Other Ambulatory Visit: Payer: Self-pay

## 2019-11-15 ENCOUNTER — Inpatient Hospital Stay: Payer: 59 | Attending: Oncology

## 2019-11-15 DIAGNOSIS — Z8261 Family history of arthritis: Secondary | ICD-10-CM | POA: Insufficient documentation

## 2019-11-15 DIAGNOSIS — Z8042 Family history of malignant neoplasm of prostate: Secondary | ICD-10-CM | POA: Insufficient documentation

## 2019-11-15 DIAGNOSIS — E785 Hyperlipidemia, unspecified: Secondary | ICD-10-CM | POA: Diagnosis not present

## 2019-11-15 DIAGNOSIS — Z87891 Personal history of nicotine dependence: Secondary | ICD-10-CM | POA: Diagnosis not present

## 2019-11-15 DIAGNOSIS — N189 Chronic kidney disease, unspecified: Secondary | ICD-10-CM | POA: Diagnosis not present

## 2019-11-15 DIAGNOSIS — D509 Iron deficiency anemia, unspecified: Secondary | ICD-10-CM | POA: Diagnosis not present

## 2019-11-15 DIAGNOSIS — G4730 Sleep apnea, unspecified: Secondary | ICD-10-CM | POA: Diagnosis not present

## 2019-11-15 DIAGNOSIS — Z90710 Acquired absence of both cervix and uterus: Secondary | ICD-10-CM | POA: Insufficient documentation

## 2019-11-15 DIAGNOSIS — D508 Other iron deficiency anemias: Secondary | ICD-10-CM

## 2019-11-15 DIAGNOSIS — Z8249 Family history of ischemic heart disease and other diseases of the circulatory system: Secondary | ICD-10-CM | POA: Insufficient documentation

## 2019-11-15 DIAGNOSIS — K219 Gastro-esophageal reflux disease without esophagitis: Secondary | ICD-10-CM | POA: Insufficient documentation

## 2019-11-15 DIAGNOSIS — Z79899 Other long term (current) drug therapy: Secondary | ICD-10-CM | POA: Diagnosis not present

## 2019-11-15 DIAGNOSIS — F419 Anxiety disorder, unspecified: Secondary | ICD-10-CM | POA: Insufficient documentation

## 2019-11-15 DIAGNOSIS — Z8349 Family history of other endocrine, nutritional and metabolic diseases: Secondary | ICD-10-CM | POA: Insufficient documentation

## 2019-11-15 DIAGNOSIS — F329 Major depressive disorder, single episode, unspecified: Secondary | ICD-10-CM | POA: Insufficient documentation

## 2019-11-15 DIAGNOSIS — I10 Essential (primary) hypertension: Secondary | ICD-10-CM | POA: Insufficient documentation

## 2019-11-15 LAB — RETICULOCYTES
Immature Retic Fract: 11.7 % (ref 2.3–15.9)
RBC.: 4.4 MIL/uL (ref 3.87–5.11)
Retic Count, Absolute: 65.1 10*3/uL (ref 19.0–186.0)
Retic Ct Pct: 1.5 % (ref 0.4–3.1)

## 2019-11-15 LAB — CBC WITH DIFFERENTIAL/PLATELET
Abs Immature Granulocytes: 0.01 10*3/uL (ref 0.00–0.07)
Basophils Absolute: 0 10*3/uL (ref 0.0–0.1)
Basophils Relative: 0 %
Eosinophils Absolute: 0.1 10*3/uL (ref 0.0–0.5)
Eosinophils Relative: 2 %
HCT: 42.1 % (ref 36.0–46.0)
Hemoglobin: 13.6 g/dL (ref 12.0–15.0)
Immature Granulocytes: 0 %
Lymphocytes Relative: 22 %
Lymphs Abs: 1.3 10*3/uL (ref 0.7–4.0)
MCH: 30.6 pg (ref 26.0–34.0)
MCHC: 32.3 g/dL (ref 30.0–36.0)
MCV: 94.6 fL (ref 80.0–100.0)
Monocytes Absolute: 0.5 10*3/uL (ref 0.1–1.0)
Monocytes Relative: 8 %
Neutro Abs: 4.2 10*3/uL (ref 1.7–7.7)
Neutrophils Relative %: 68 %
Platelets: 191 10*3/uL (ref 150–400)
RBC: 4.45 MIL/uL (ref 3.87–5.11)
RDW: 11.8 % (ref 11.5–15.5)
WBC: 6.2 10*3/uL (ref 4.0–10.5)
nRBC: 0 % (ref 0.0–0.2)

## 2019-11-15 LAB — IRON AND TIBC
Iron: 78 ug/dL (ref 41–142)
Saturation Ratios: 27 % (ref 21–57)
TIBC: 293 ug/dL (ref 236–444)
UIBC: 215 ug/dL (ref 120–384)

## 2019-11-15 LAB — FERRITIN: Ferritin: 32 ng/mL (ref 11–307)

## 2019-11-21 NOTE — Progress Notes (Signed)
[facility]  [phone] [phone]    ID: [name] DOB: November 14, 1955  MR#: [id number]  [id number]  Patient Care Team: Rutherford Guys, MD as PCP - General (Family Medicine) Leonie Man, MD as PCP - Cardiology (Cardiology) Marquisha Nikolov, Virgie Dad, MD as Consulting Physician (Hematology and Oncology) Garner Nash, DO as Consulting Physician (Pulmonary Disease) Milus Banister, MD as Attending Physician (Gastroenterology) OTHER MD: Icard   CHIEF COMPLAINT: Iron deficiency anemia  CURRENT TREATMENT: oral iron supplementation   INTERVAL HISTORY: [name] returns today for follow up of her iron deficiency anemia. She was evaluated in the hematology clinic on 10/13/2018.   She received feraheme for total of 4 doses, last given on 03/04/2019. Her most recent lab work from 11/15/2019 was within normal limits, detailed below.  Work-up for GI bleed included a colonoscopy which removed a precancerous polyp on 12/21/2018 [phone]).  She then underwent flexible sigmoidoscopy to the splenic flexure on 07/25/2019 which showed no residual polyp in the area in question and was otherwise normal, and upper endoscopy 12/22/2018, which showed a medium sized hiatal hernia with some obvious erosions.   REVIEW OF SYSTEMS: Bertice continues to be short of breath.  She has been evaluated by pulmonary.  She understands this is really a weight issue.  She does not know how to proceed.  She cannot exercise because she is short of breath and she is short of breath she thinks because she cannot exercise.  She feels very isolated, her husband working third shift and her daughter is living in [place].  A detailed review of systems today was otherwise noncontributory   HISTORY OF CURRENT ILLNESS: ALIYAH [name] is referred here for iron deficiency anemia.   Results for [name] (MRN [id number]) as of 10/12/2018 17:19  Ref. Range 10/08/2018 16:45  Iron Latest Ref  Range: 27 - 139 ug/dL 15 (L)  UIBC Latest Ref Range: 118 - 369 ug/dL 400 (H)  TIBC Latest Ref Range: 250 - 450 ug/dL 415  Ferritin Latest Ref Range: 15 - 150 ng/mL 6 (L)  Iron Saturation Latest Ref Range: 15 - 55 % 4 (LL)    The patient's subsequent history is as detailed below.   PAST MEDICAL HISTORY: Past Medical History:  Diagnosis Date  . Allergy    [name], Flonase  . Anemia   . Anxiety   . Arthritis   . Chronic kidney disease   . Colon polyps   . Depression   . Fibromyalgia   . Gastritis   . GERD (gastroesophageal reflux disease)   . Hernia, hiatal   . High risk for colon cancer   . HLD (hyperlipidemia)   . Hypertension   . Migraine   . Neuropathy   . Osteoporosis   . Pneumonia   . Sleep apnea    c-pap nightly  . Sleep apnea with use of continuous positive airway pressure (CPAP)   . Thyroid goiter   Congestive Heart Failure; Englarged goiter   PAST SURGICAL HISTORY: Past Surgical History:  Procedure Laterality Date  . 48-Hour Holter Monitor  06/2017   Mostly normal sinus rhythm.  Average heart rate 71 bpm.  Minimum 54 bpm.  Maximum sinus rate 114 bpm.  Rare PACs and PVCs.  No A. fib, atrial flutter, SVT or VT.  One brief run of 8 beat PAT.  Marland Kitchen ABDOMINAL HYSTERECTOMY     ovaries intact; DUB.  No dysplasia.  Marland Kitchen JOINT REPLACEMENT    .  OPEN REDUCTION INTERNAL FIXATION (ORIF) DISTAL RADIAL FRACTURE Right 07/09/2017   Procedure: RIGHT WRIST OPEN REDUCTION INTERNAL FIXATION (ORIF) DISTAL RADIAL FRACTURE AND REPAIR AS INDICATED;  Surgeon: Iran Planas, MD;  Location: [facility];  Service: Orthopedics;  Laterality: Right;  . PARTIAL HYSTERECTOMY    . ROTATOR CUFF REPAIR Right 2009  . SHOULDER SURGERY Right    shoulder dislocation, fall, and elbow unla nerve  . SHOULDER SURGERY Right 2015   labral tear  . TRANSTHORACIC ECHOCARDIOGRAM  06/2017    EF 55-60%.  grade 1 diastolic dysfunction.  Otherwise normal  . TUBAL LIGATION    . ULNAR NERVE REPAIR Right 08/19/2014    Hammertoe surgery   FAMILY HISTORY: Family History  Problem Relation Age of Onset  . Heart disease Mother 88       AMI/CAD/CHF as cause of death  . Hypertension Mother   . Hyperlipidemia Mother   . Hypothyroidism Mother   . Depression Mother   . Gout Mother   . Heart disease Father 66       AMI  . Hypertension Father   . Stroke Father 56       mild CVA  . Prostate cancer Father   . Hyperlipidemia Father   . Cancer Father 1       prostate cancer  . Hypertension Brother   . Hyperlipidemia Brother   . Cancer Brother        prostate cancer  . Cancer Maternal Grandmother        type unknown  . Cancer Maternal Grandfather        type unknown  . Heart disease Paternal Grandmother   . Heart defect Paternal Grandfather   . Melanoma Brother   . Hyperlipidemia Brother   . Hypertension Brother   . Cancer Brother        melanoma  . Hypertension Brother   . Hyperlipidemia Brother   . Melanoma Brother   . Cancer Brother 25       melanoma  . Cancer Sister        Basal cell carcinoma scalp  . Depression Sister   . Hypertension Brother   . Depression Brother   . Hypertension Brother   . Gout Brother   . Arthritis Sister   . Depression Sister   . Colon cancer Neg Hx   . Esophageal cancer Neg Hx   . Stomach cancer Neg Hx   . Rectal cancer Neg Hx    Roshaunda's father died from heart complications at age 37. Patients' mother died from a myocardial infarction at age 19. The patient has 5 blood brothers, one adoptive brother, and 2 sisters. Patient denies anyone in her family having breast, ovarian, or pancreatic cancer. One of her brothers was diagnosed with prostate cancer and crones disease. [name] has had 3 siblings with melanoma diagnosis.    GYNECOLOGIC HISTORY:  No LMP recorded. Patient has had a hysterectomy. 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 Her husband, [name], is a respiratory therapist with [employer]. Indiyah has 3 children, but her first child was adopted out. Her daughter [name] is 41, lives in [place], and works at [employer]. April is 79, lives in [place], and also works at [employer] has 8 granddaughters and has 1 grandson on  the way. She is [employer].    ADVANCED DIRECTIVES: Her husband is automatically her healthcare power of attorney.     HEALTH MAINTENANCE: Social History   Tobacco Use  . Smoking status: Former Smoker    Quit date: 08/04/1998    Years since quitting: 21.3  . Smokeless tobacco: Never Used  Substance Use Topics  . Alcohol use: No  . Drug use: No    Colonoscopy: 2020/ [facility]  PAP:   Bone density:   Mammography: Overdue  Allergies  Allergen Reactions  . Sulfa Antibiotics Itching    Current Outpatient Medications  Medication Sig Dispense Refill  . Albuterol Sulfate (PROAIR RESPICLICK) 123XX123 (90 Base) MCG/ACT AEPB Inhale 2 puffs into the lungs 4 (four) times daily as needed. 1 each 1  . atorvastatin (LIPITOR) 10 MG tablet TAKE 1 TABLET BY MOUTH ONCE DAILY AT 6PM 90 tablet 1  . azelastine (ASTELIN) 0.1 % nasal spray Place 2 sprays into both nostrils 2 (two) times daily. Use in each nostril as directed 30 mL 12  . butalbital-acetaminophen-caffeine (FIORICET) 50-325-40 MG tablet Take 1-2 tablets by mouth every 6 (six) hours as needed for headache. 40 tablet 0  . cyclobenzaprine (FLEXERIL) 10 MG tablet TAKE 1/2-1 TABLET BY MOUTH 3 TIMES DAILY AS NEEDED FOR MUSCLE SPASMS. 180 tablet 1  . DEXILANT 60 MG capsule TAKE 1 CAPSULE BY MOUTH ONCE DAILY 90 capsule 1  . DULoxetine (CYMBALTA) 60 MG capsule TAKE 1 CAPSULE BY MOUTH ONCE DAILY 90 capsule 1  . fluticasone (FLONASE) 50 MCG/ACT nasal spray Place 1 spray into both nostrils daily. 16 g 6  . furosemide (LASIX) 20 MG tablet Take 1 tablet (20 mg total) by mouth 2 (two)  times daily as needed. 135 tablet 4  . gabapentin (NEURONTIN) 300 MG capsule TAKE 1-2 CAPSULES BY MOUTH FOUR TIMES DAILY 540 capsule 1  . lisinopril (ZESTRIL) 20 MG tablet TAKE 1 TABLET BY MOUTH ONCE DAILY 90 tablet 1  . ondansetron (ZOFRAN-ODT) 4 MG disintegrating tablet DISSOLVE 1 TABLET BY MOUTH EVERY 8 HOURS AS NEEDED FOR NAUSEA OR VOMITING. 20 tablet 4  . oxyCODONE-acetaminophen (PERCOCET/ROXICET) 5-325 MG tablet Take 1 tablet by mouth every 12 (twelve) hours as needed for severe pain. 30 tablet 0  . propranolol (INDERAL) 60 MG tablet TAKE 1 TABLET (60 MG TOTAL) BY MOUTH 3 (THREE) TIMES DAILY. 270 tablet 2  . sertraline (ZOLOFT) 100 MG tablet Take 1 tablet (100 mg total) by mouth daily. 90 tablet 1  . topiramate (TOPAMAX) 50 MG tablet TAKE 1 TABLET BY MOUTH TWICE DAILY 180 tablet 1   No current facility-administered medications for this visit.     OBJECTIVE: Morbidly obese white woman who appears stated age  64:   11/22/19 0850  BP: 139/73  Pulse: 70  Resp: 18  Temp: 98.2 F (36.8 C)  SpO2: 97%     Body mass index is 43.98 kg/m.   Wt Readings from Last 3 Encounters:  11/22/19 264 lb 4.8 oz (119.9 kg)  10/27/19 263 lb 12.8 oz (119.7 kg)  10/05/19 264 lb 6.4 oz (119.9 kg)      ECOG FS:2 - Symptomatic, <50% confined to bed  Sclerae unicteric, EOMs intact Wearing a mask No cervical or supraclavicular adenopathy Lungs no rales or rhonchi Heart regular rate and rhythm Abd soft, nontender, positive bowel sounds MSK no focal spinal tenderness, no upper extremity lymphedema Neuro: nonfocal, well oriented, appropriate affect   LAB RESULTS:  CMP     Component Value  Date/Time   NA 144 09/13/2019 1531   K 4.3 09/13/2019 1531   CL 113 (H) 09/13/2019 1531   CO2 19 (L) 09/13/2019 1531   GLUCOSE 83 09/13/2019 1531   GLUCOSE 107 (H) 03/23/2018 1637   BUN 7 (L) 09/13/2019 1531   CREATININE 0.93 09/13/2019 1531   CREATININE 1.01 (H) 05/21/2016 1219   CALCIUM 9.2  09/13/2019 1531   PROT 6.4 09/13/2019 1531   ALBUMIN 4.0 09/13/2019 1531   AST 16 09/13/2019 1531   ALT 12 09/13/2019 1531   ALKPHOS 70 09/13/2019 1531   BILITOT 0.2 09/13/2019 1531   GFRNONAA 66 09/13/2019 1531   GFRNONAA 61 04/22/2016 1743   GFRAA 76 09/13/2019 1531   GFRAA 70 04/22/2016 1743    No results found for: TOTALPROTELP, ALBUMINELP, A1GS, A2GS, BETS, BETA2SER, GAMS, MSPIKE, SPEI  No results found for: KPAFRELGTCHN, LAMBDASER, KAPLAMBRATIO  Lab Results  Component Value Date   WBC 6.2 11/15/2019   NEUTROABS 4.2 11/15/2019   HGB 13.6 11/15/2019   HCT 42.1 11/15/2019   MCV 94.6 11/15/2019   PLT 191 11/15/2019   No results found for: LABCA2  No components found for: [id number]  No results for input(s): INR in the last 168 hours.  No results found for: LABCA2  No results found for: [id number]  No results found for: [id number]  No results found for: [id number]  No results found for: CA2729  No components found for: HGQUANT  No results found for: CEA1 / No results found for: CEA1   No results found for: AFPTUMOR  No results found for: CHROMOGRNA  No results found for: HGBA, HGBA2QUANT, HGBFQUANT, HGBSQUAN (Hemoglobinopathy evaluation)   No results found for: LDH  Lab Results  Component Value Date   IRON 78 11/15/2019   TIBC 293 11/15/2019   IRONPCTSAT 27 11/15/2019   (Iron and TIBC)  Lab Results  Component Value Date   FERRITIN 32 11/15/2019    Urinalysis    Component Value Date/Time   COLORURINE YELLOW 03/09/2008 2224   APPEARANCEUR CLEAR 03/09/2008 2224   LABSPEC 1.014 03/09/2008 2224   PHURINE 5.0 03/09/2008 2224   GLUCOSEU NEGATIVE 03/09/2008 2224   HGBUR NEGATIVE 03/09/2008 2224   BILIRUBINUR negative 02/19/2018 1104   KETONESUR negative 02/19/2018 [place] 03/09/2008 2224   PROTEINUR trace (A) 02/19/2018 1104   PROTEINUR NEGATIVE 03/09/2008 2224   UROBILINOGEN 0.2 02/19/2018 1104   UROBILINOGEN 0.2 03/09/2008 2224    NITRITE Negative 02/19/2018 1104   NITRITE NEGATIVE 03/09/2008 2224   LEUKOCYTESUR Small (1+) (A) 02/19/2018 1104    STUDIES:  No results found.   ELIGIBLE FOR AVAILABLE RESEARCH PROTOCOL: no   ASSESSMENT: 64 y.o. [place], [place] woman with a remote history of peptic ulcer disease and a benign colonoscopy 2012, subsequently iron deficient without an obvious bleeding source  (1) status post Feraheme x4, last dose 03/04/2019, with resolution of the iron deficiency anemia  (a) continue daily oral iron supplementation indefinitely  (2) work-up for GI bleed showed a polyp with high-grade dysplasia, removed 12/21/2018  (a) subsequent flexible sigmoidoscopy December 2020 showed no residual disease  (b) EGD May 2020 showed moderate hiatal hernia with erosions  (3) morbid obesity   PLAN: Neidhart has resolved the iron deficiency.  I think it would be prudent for her to continue oral iron supplementation.  She tells me she has tolerated this well, with no nausea or constipation or problems with gastritis.    She is behind on mammography and I  have gone ahead and placed the order for her to have that done in the near future  Her biggest problem at present is morbid obesity.  She did not seem to have a good understanding of the role of diet or how to diet.  I am requesting a nutrition consult for her.  I also suggested that she eliminate all carbohydrates from her diet for the next 3 months.  She understands this means no potatoes no bread no past no rice no soft drinks and no snacks.  She can eat all the vegetables fruits meat and dairy she likes.  My expectation is that after 3 months she will have lost a significant amount of weight and if so then she can continue on this diet her desired weight which she says is 165 pounds.  If this does not work perhaps she could consider bariatric surgery  At this point I feel comfortable releasing her to her primary care physician.  I would expect that she will  have lab work on a yearly basis and if she does appear to become anemic again I will be glad to see her at that point  Total encounter time 35 minutes.*    Soni Kegel, Virgie Dad, MD  11/22/19 9:20 AM Medical Oncology and Hematology [facility] [place], [place] [address] Tel. [phone]    Fax. [phone]    I, Wilburn Mylar, am acting as scribe for Dr. Virgie Dad. Grant Swager.     *Total Encounter Time as defined by the Centers for Medicare and Medicaid Services includes, in addition to the face-to-face time of a patient visit (documented in the note above) non-face-to-face time: obtaining and reviewing outside history, ordering and reviewing medications, tests or procedures, care coordination (communications with other health care professionals or caregivers) and documentation in the medical record.

## 2019-11-22 ENCOUNTER — Inpatient Hospital Stay (HOSPITAL_BASED_OUTPATIENT_CLINIC_OR_DEPARTMENT_OTHER): Payer: 59 | Admitting: Oncology

## 2019-11-22 ENCOUNTER — Other Ambulatory Visit: Payer: Self-pay

## 2019-11-22 DIAGNOSIS — N189 Chronic kidney disease, unspecified: Secondary | ICD-10-CM | POA: Diagnosis not present

## 2019-11-22 DIAGNOSIS — D508 Other iron deficiency anemias: Secondary | ICD-10-CM | POA: Diagnosis not present

## 2019-11-22 DIAGNOSIS — F329 Major depressive disorder, single episode, unspecified: Secondary | ICD-10-CM | POA: Diagnosis not present

## 2019-11-22 DIAGNOSIS — G4730 Sleep apnea, unspecified: Secondary | ICD-10-CM | POA: Diagnosis not present

## 2019-11-22 DIAGNOSIS — I10 Essential (primary) hypertension: Secondary | ICD-10-CM | POA: Diagnosis not present

## 2019-11-22 DIAGNOSIS — I519 Heart disease, unspecified: Secondary | ICD-10-CM | POA: Diagnosis not present

## 2019-11-22 DIAGNOSIS — E785 Hyperlipidemia, unspecified: Secondary | ICD-10-CM | POA: Diagnosis not present

## 2019-11-22 DIAGNOSIS — D509 Iron deficiency anemia, unspecified: Secondary | ICD-10-CM | POA: Diagnosis not present

## 2019-11-22 DIAGNOSIS — I5032 Chronic diastolic (congestive) heart failure: Secondary | ICD-10-CM

## 2019-11-22 DIAGNOSIS — K219 Gastro-esophageal reflux disease without esophagitis: Secondary | ICD-10-CM | POA: Diagnosis not present

## 2019-11-22 DIAGNOSIS — I5189 Other ill-defined heart diseases: Secondary | ICD-10-CM

## 2019-11-22 DIAGNOSIS — F419 Anxiety disorder, unspecified: Secondary | ICD-10-CM | POA: Diagnosis not present

## 2019-11-23 ENCOUNTER — Telehealth: Payer: Self-pay | Admitting: Oncology

## 2019-11-23 NOTE — Telephone Encounter (Signed)
No changes made to pt's schedule per 4/20 los.

## 2019-12-06 ENCOUNTER — Ambulatory Visit (INDEPENDENT_AMBULATORY_CARE_PROVIDER_SITE_OTHER): Payer: 59 | Admitting: Family Medicine

## 2019-12-06 VITALS — BP 139/73 | Ht 64.0 in | Wt 264.0 lb

## 2019-12-06 DIAGNOSIS — Z0000 Encounter for general adult medical examination without abnormal findings: Secondary | ICD-10-CM

## 2019-12-06 NOTE — Progress Notes (Signed)
Presents today for [employer] Visit   Date of last exam:09/13/2019   Interpreter used for this visit? No  I connected with  ARIAS BARMES on 12/06/19 by telephone enabled  and verified that I am speaking with the correct person using two identifiers.   I discussed the limitations of evaluation and management by telemedicine. The patient expressed understanding and agreed to proceed.    Patient Care Team: Rutherford Guys, MD as PCP - General (Family Medicine) Leonie Man, MD as PCP - Cardiology (Cardiology) Magrinat, Virgie Dad, MD as Consulting Physician (Hematology and Oncology) Garner Nash, DO as Consulting Physician (Pulmonary Disease) Milus Banister, MD as Attending Physician (Gastroenterology)   Other items to address today:  Discussed Eye/Dental Discussed Immunizations 3 month f/u Dr. Pamella Pert 5-11 @ 1:40  Other Screening: Last screening for diabetes:09-13-2019 Last lipid screening: 09-13-2019  ADVANCE DIRECTIVES: Discussed: yes On File: no Materials Provided: yes  Immunization status:  Immunization History  Administered Date(s) Administered  . Influenza,inj,Quad PF,6+ Mos 05/28/2015, 05/21/2016, 04/21/2017, 04/27/2018, 05/11/2019  . PFIZER SARS-COV-2 Vaccination 10/20/2019, 11/29/2019  . Tdap 06/25/2015  . Zoster Recombinat (Shingrix) 02/09/2017, 04/21/2017     There are no preventive care reminders to display for this patient.   Functional Status Survey: Is the patient deaf or have difficulty hearing?: No Does the patient have difficulty seeing, even when wearing glasses/contacts?: No Does the patient have difficulty concentrating, remembering, or making decisions?: No Does the patient have difficulty walking or climbing stairs?: Yes(knee pain/ looses balance.) Does the patient have difficulty dressing or bathing?: No Does the patient have difficulty doing errands alone such as visiting a doctor's office or shopping?: No   6CIT Screen 12/06/2019  What Year? 0 points  What month? 0 points  What time? 0 points  Count back from 20 0 points  Months in reverse 0 points  Repeat phrase 0 points  Total Score 0        Clinical Support from 12/06/2019 in [facility]  AUDIT-C Score  0       Home Environment:   Lives in two story home Yes trouble climbing stairs (knee Pain) short of breath No scattered rugs No grab bars Adequate lighting/ no clutter    Patient Active Problem List   Diagnosis Date Noted  . Iron deficiency anemia 10/13/2018  . Pulmonary edema with congestive heart failure with preserved left ventricular function ([place]) 03/23/2018  . DOE (dyspnea on exertion) 03/23/2018  . Obesity, morbid, BMI 40.0-49.9 ([place]) 03/23/2018  . Chronic diastolic heart failure ([place]) 03/23/2018  . Grade I diastolic dysfunction 0000000  . Migraine without aura and without status migrainosus, not intractable 03/02/2017  . Class 3 severe obesity due to excess calories with serious comorbidity and body mass index (BMI) of 40.0 to 44.9 in adult ([place]) 01/13/2017  . Hiatal hernia 10/28/2016  . Pure hypercholesterolemia 10/28/2016  . Gastritis and gastroduodenitis 10/28/2016  . Chronic kidney disease 06/01/2015  . Esophageal reflux 06/01/2015  . Goiter 06/01/2015  . Essential hypertension 06/01/2015  . Chronic pain syndrome 05/28/2015  . Fibromyalgia 05/28/2015  . OSA (obstructive sleep apnea) 05/28/2015  . Adjustment disorder with mixed anxiety and depressed mood 05/28/2015     Past Medical History:  Diagnosis Date  . Allergy    [name], Flonase  . Anemia   . Anxiety   . Arthritis   . Chronic kidney disease   . Colon polyps   . Depression   .  Fibromyalgia   . Gastritis   . GERD (gastroesophageal reflux disease)   . Hernia, hiatal   . High risk for colon cancer   . HLD (hyperlipidemia)   . Hypertension   . Migraine   . Neuropathy   . Osteoporosis   . Pneumonia   . Sleep apnea     c-pap nightly  . Sleep apnea with use of continuous positive airway pressure (CPAP)   . Thyroid goiter      Past Surgical History:  Procedure Laterality Date  . 48-Hour Holter Monitor  06/2017   Mostly normal sinus rhythm.  Average heart rate 71 bpm.  Minimum 54 bpm.  Maximum sinus rate 114 bpm.  Rare PACs and PVCs.  No A. fib, atrial flutter, SVT or VT.  One brief run of 8 beat PAT.  Marland Kitchen ABDOMINAL HYSTERECTOMY     ovaries intact; DUB.  No dysplasia.  Marland Kitchen JOINT REPLACEMENT    . OPEN REDUCTION INTERNAL FIXATION (ORIF) DISTAL RADIAL FRACTURE Right 07/09/2017   Procedure: RIGHT WRIST OPEN REDUCTION INTERNAL FIXATION (ORIF) DISTAL RADIAL FRACTURE AND REPAIR AS INDICATED;  Surgeon: Iran Planas, MD;  Location: [facility];  Service: Orthopedics;  Laterality: Right;  . PARTIAL HYSTERECTOMY    . ROTATOR CUFF REPAIR Right 2009  . SHOULDER SURGERY Right    shoulder dislocation, fall, and elbow unla nerve  . SHOULDER SURGERY Right 2015   labral tear  . TRANSTHORACIC ECHOCARDIOGRAM  06/2017    EF 55-60%.  grade 1 diastolic dysfunction.  Otherwise normal  . TUBAL LIGATION    . ULNAR NERVE REPAIR Right 08/19/2014     Family History  Problem Relation Age of Onset  . Heart disease Mother 68       AMI/CAD/CHF as cause of death  . Hypertension Mother   . Hyperlipidemia Mother   . Hypothyroidism Mother   . Depression Mother   . Gout Mother   . Heart disease Father 13       AMI  . Hypertension Father   . Stroke Father 51       mild CVA  . Prostate cancer Father   . Hyperlipidemia Father   . Cancer Father 15       prostate cancer  . Hypertension Brother   . Hyperlipidemia Brother   . Cancer Brother        prostate cancer  . Cancer Maternal Grandmother        type unknown  . Cancer Maternal Grandfather        type unknown  . Heart disease Paternal Grandmother   . Heart defect Paternal Grandfather   . Melanoma Brother   . Hyperlipidemia Brother   . Hypertension Brother   . Cancer Brother         melanoma  . Hypertension Brother   . Hyperlipidemia Brother   . Melanoma Brother   . Cancer Brother 25       melanoma  . Cancer Sister        Basal cell carcinoma scalp  . Depression Sister   . Hypertension Brother   . Depression Brother   . Hypertension Brother   . Gout Brother   . Arthritis Sister   . Depression Sister   . Colon cancer Neg Hx   . Esophageal cancer Neg Hx   . Stomach cancer Neg Hx   . Rectal cancer Neg Hx      Social History   Socioeconomic History  . Marital status: Married  Spouse name: Not on file  . Number of children: 3  . Years of education: Not on file  . Highest education level: Not on file  Occupational History  . Occupation: unemployed  Tobacco Use  . Smoking status: Former Smoker    Quit date: 08/04/1998    Years since quitting: 21.3  . Smokeless tobacco: Never Used  Substance and Sexual Activity  . Alcohol use: No  . Drug use: No  . Sexual activity: Yes    Birth control/protection: Surgical, Post-menopausal  Other Topics Concern  . Not on file  Social History Narrative   Marital status: married x 20 years; moderately happy     Children: 3 children (47 [name], 4 Dianna, 40 April); 8 grandchildren; 0 gg     Lives: with husband/Steve, April, 2 granddaughters     Employment:  Unemployed; quit working 2015 with work related injury; awaiting disability in 2018; disability approved in 02/2017.       Tobacco: quit smoking in 2016; smoked x 2 years.      Alcohol: none      Drugs: none      Exercise: rarely in 2018         Social Determinants of Health   Financial Resource Strain:   . Difficulty of Paying Living Expenses:   Food Insecurity:   . Worried About Charity fundraiser in the Last Year:   . Arboriculturist in the Last Year:   Transportation Needs:   . Film/video editor (Medical):   Marland Kitchen Lack of Transportation (Non-Medical):   Physical Activity:   . Days of Exercise per Week:   . Minutes of Exercise per Session:    Stress:   . Feeling of Stress :   Social Connections:   . Frequency of Communication with Friends and Family:   . Frequency of Social Gatherings with Friends and Family:   . Attends Religious Services:   . Active Member of Clubs or Organizations:   . Attends Archivist Meetings:   Marland Kitchen Marital Status:   Intimate Partner Violence:   . Fear of Current or Ex-Partner:   . Emotionally Abused:   Marland Kitchen Physically Abused:   . Sexually Abused:      Allergies  Allergen Reactions  . Sulfa Antibiotics Itching     Prior to Admission medications   Medication Sig Start Date End Date Taking? Authorizing Provider  Albuterol Sulfate (PROAIR RESPICLICK) 123XX123 (90 Base) MCG/ACT AEPB Inhale 2 puffs into the lungs 4 (four) times daily as needed. 12/10/17  Yes Rutherford Guys, MD  atorvastatin (LIPITOR) 10 MG tablet TAKE 1 TABLET BY MOUTH ONCE DAILY AT 6PM 07/15/19  Yes Rutherford Guys, MD  azelastine (ASTELIN) 0.1 % nasal spray Place 2 sprays into both nostrils 2 (two) times daily. Use in each nostril as directed 06/03/19  Yes McVey, Gelene Mink, PA-C  butalbital-acetaminophen-caffeine (FIORICET) [id number] MG tablet Take 1-2 tablets by mouth every 6 (six) hours as needed for headache. 03/14/19 03/13/20 Yes Rutherford Guys, MD  cyclobenzaprine (FLEXERIL) 10 MG tablet TAKE 1/2-1 TABLET BY MOUTH 3 TIMES DAILY AS NEEDED FOR MUSCLE SPASMS. 09/13/19  Yes Rutherford Guys, MD  DEXILANT 60 MG capsule TAKE 1 CAPSULE BY MOUTH ONCE DAILY 07/15/19  Yes Rutherford Guys, MD  DULoxetine (CYMBALTA) 60 MG capsule TAKE 1 CAPSULE BY MOUTH ONCE DAILY 07/15/19  Yes Rutherford Guys, MD  fluticasone (FLONASE) 50 MCG/ACT nasal spray Place 1 spray into both nostrils  daily. 01/07/19  Yes Wendie Agreste, MD  furosemide (LASIX) 20 MG tablet Take 1 tablet (20 mg total) by mouth 2 (two) times daily as needed. 09/13/19  Yes Rutherford Guys, MD  gabapentin (NEURONTIN) 300 MG capsule TAKE 1-2 CAPSULES BY MOUTH FOUR TIMES DAILY  07/15/19  Yes Rutherford Guys, MD  lisinopril (ZESTRIL) 20 MG tablet TAKE 1 TABLET BY MOUTH ONCE DAILY 07/15/19  Yes Rutherford Guys, MD  ondansetron (ZOFRAN-ODT) 4 MG disintegrating tablet DISSOLVE 1 TABLET BY MOUTH EVERY 8 HOURS AS NEEDED FOR NAUSEA OR VOMITING. 01/07/19  Yes Wendie Agreste, MD  oxyCODONE-acetaminophen (PERCOCET/ROXICET) 5-325 MG tablet Take 1 tablet by mouth every 12 (twelve) hours as needed for severe pain. 09/13/19  Yes Rutherford Guys, MD  propranolol (INDERAL) 60 MG tablet TAKE 1 TABLET (60 MG TOTAL) BY MOUTH 3 (THREE) TIMES DAILY. 07/15/19  Yes Lendon Colonel, NP  sertraline (ZOLOFT) 100 MG tablet Take 1 tablet (100 mg total) by mouth daily. 09/13/19  Yes Rutherford Guys, MD  topiramate (TOPAMAX) 50 MG tablet TAKE 1 TABLET BY MOUTH TWICE DAILY 10/13/19  Yes Wendie Agreste, MD     Depression screen [facility] 2/9 12/06/2019 09/13/2019 06/13/2019 03/14/2019 01/07/2019  Decreased Interest 0 0 0 0 0  Down, Depressed, Hopeless 0 0 0 0 0  PHQ - 2 Score 0 0 0 0 0  Altered sleeping - - - - -  Tired, decreased energy - - - - -  Change in appetite - - - - -  Feeling bad or failure about yourself  - - - - -  Trouble concentrating - - - - -  Moving slowly or fidgety/restless - - - - -  Suicidal thoughts - - - - -  PHQ-9 Score - - - - -  Difficult doing work/chores - - - - -  Some recent data might be hidden     Fall Risk  12/06/2019 09/13/2019 06/13/2019 03/14/2019 01/07/2019  Falls in the past year? 0 0 0 0 0  Number falls in past yr: 0 0 0 0 0  Injury with Fall? 0 0 0 0 0  Comment - - - - -  Follow up Falls evaluation completed;Education provided - Falls evaluation completed - Falls evaluation completed      PHYSICAL EXAM: BP 139/73 Comment: not in clinic  Ht 5\' 4"  (1.626 m)   Wt 264 lb (119.7 kg)   BMI 45.32 kg/m    Wt Readings from Last 3 Encounters:  12/06/19 264 lb (119.7 kg)  11/22/19 264 lb 4.8 oz (119.9 kg)  10/27/19 263 lb 12.8 oz (119.7 kg)   Medicare annual  wellness visit, subsequent     Education/Counseling provided regarding diet and exercise, prevention of chronic diseases, smoking/tobacco cessation, if applicable, and reviewed "Covered Medicare Preventive Services."

## 2019-12-06 NOTE — Patient Instructions (Addendum)
Thank you for taking time to come for your Medicare Wellness Visit. I appreciate your ongoing commitment to your health goals. Please review the following plan we discussed and let me know if I can assist you in the future.  Melissa Kennedy LPN  Preventive Care 64-64 Years Old, Female Preventive care refers to visits with your health care provider and lifestyle choices that can promote health and wellness. This includes:  A yearly physical exam. This may also be called an annual well check.  Regular dental visits and eye exams.  Immunizations.  Screening for certain conditions.  Healthy lifestyle choices, such as eating a healthy diet, getting regular exercise, not using drugs or products that contain nicotine and tobacco, and limiting alcohol use. What can I expect for my preventive care visit? Physical exam Your health care provider will check your:  Height and weight. This may be used to calculate body mass index (BMI), which tells if you are at a healthy weight.  Heart rate and blood pressure.  Skin for abnormal spots. Counseling Your health care provider may ask you questions about your:  Alcohol, tobacco, and drug use.  Emotional well-being.  Home and relationship well-being.  Sexual activity.  Eating habits.  Work and work Statistician.  Method of birth control.  Menstrual cycle.  Pregnancy history. What immunizations do I need?  Influenza (flu) vaccine  This is recommended every year. Tetanus, diphtheria, and pertussis (Tdap) vaccine  You may need a Td booster every 10 years. Varicella (chickenpox) vaccine  You may need this if you have not been vaccinated. Zoster (shingles) vaccine  You may need this after age 25. Measles, mumps, and rubella (MMR) vaccine  You may need at least one dose of MMR if you were born in 1957 or later. You may also need a second dose. Pneumococcal conjugate (PCV13) vaccine  You may need this if you have certain conditions  and were not previously vaccinated. Pneumococcal polysaccharide (PPSV23) vaccine  You may need one or two doses if you smoke cigarettes or if you have certain conditions. Meningococcal conjugate (MenACWY) vaccine  You may need this if you have certain conditions. Hepatitis A vaccine  You may need this if you have certain conditions or if you travel or work in places where you may be exposed to hepatitis A. Hepatitis B vaccine  You may need this if you have certain conditions or if you travel or work in places where you may be exposed to hepatitis B. Haemophilus influenzae type b (Hib) vaccine  You may need this if you have certain conditions. Human papillomavirus (HPV) vaccine  If recommended by your health care provider, you may need three doses over 6 months. You may receive vaccines as individual doses or as more than one vaccine together in one shot (combination vaccines). Talk with your health care provider about the risks and benefits of combination vaccines. What tests do I need? Blood tests  Lipid and cholesterol levels. These may be checked every 5 years, or more frequently if you are over 64 years old.  Hepatitis C test.  Hepatitis B test. Screening  Lung cancer screening. You may have this screening every year starting at age 64 if you have a 30-pack-year history of smoking and currently smoke or have quit within the past 15 years.  Colorectal cancer screening. All adults should have this screening starting at age 64 and continuing until age 64. Your health care provider may recommend screening at age 64 if you are  at increased risk. You will have tests every 1-10 years, depending on your results and the type of screening test.  Diabetes screening. This is done by checking your blood sugar (glucose) after you have not eaten for a while (fasting). You may have this done every 1-3 years.  Mammogram. This may be done every 1-2 years. Talk with your health care provider  about when you should start having regular mammograms. This may depend on whether you have a family history of breast cancer.  BRCA-related cancer screening. This may be done if you have a family history of breast, ovarian, tubal, or peritoneal cancers.  Pelvic exam and Pap test. This may be done every 3 years starting at age 64. Starting at age 64, this may be done every 5 years if you have a Pap test in combination with an HPV test. Other tests  Sexually transmitted disease (STD) testing.  Bone density scan. This is done to screen for osteoporosis. You may have this scan if you are at high risk for osteoporosis. Follow these instructions at home: Eating and drinking  Eat a diet that includes fresh fruits and vegetables, whole grains, lean protein, and low-fat dairy.  Take vitamin and mineral supplements as recommended by your health care provider.  Do not drink alcohol if: ? Your health care provider tells you not to drink. ? You are pregnant, may be pregnant, or are planning to become pregnant.  If you drink alcohol: ? Limit how much you have to 0-1 drink a day. ? Be aware of how much alcohol is in your drink. In the [place] one drink equals one 12 oz bottle of beer (355 mL), one 5 oz glass of wine (148 mL), or one 1 oz glass of hard liquor (44 mL). Lifestyle  Take daily care of your teeth and gums.  Stay active. Exercise for at least 30 minutes on 5 or more days each week.  Do not use any products that contain nicotine or tobacco, such as cigarettes, e-cigarettes, and chewing tobacco. If you need help quitting, ask your health care provider.  If you are sexually active, practice safe sex. Use a condom or other form of birth control (contraception) in order to prevent pregnancy and STIs (sexually transmitted infections).  If told by your health care provider, take low-dose aspirin daily starting at age 64. What's next?  Visit your health care provider once a year for a well  check visit.  Ask your health care provider how often you should have your eyes and teeth checked.  Stay up to date on all vaccines. This information is not intended to replace advice given to you by your health care provider. Make sure you discuss any questions you have with your health care provider. Document Revised: 04/01/2018 Document Reviewed: 04/01/2018 Elsevier Patient Education  2020 [employer].

## 2019-12-13 ENCOUNTER — Encounter: Payer: Self-pay | Admitting: Family Medicine

## 2019-12-13 ENCOUNTER — Ambulatory Visit (INDEPENDENT_AMBULATORY_CARE_PROVIDER_SITE_OTHER): Payer: 59 | Admitting: Family Medicine

## 2019-12-13 ENCOUNTER — Other Ambulatory Visit: Payer: Self-pay | Admitting: Family Medicine

## 2019-12-13 ENCOUNTER — Other Ambulatory Visit: Payer: Self-pay

## 2019-12-13 VITALS — BP 118/77 | HR 66 | Temp 98.0°F | Ht 64.0 in | Wt 261.0 lb

## 2019-12-13 DIAGNOSIS — R1011 Right upper quadrant pain: Secondary | ICD-10-CM

## 2019-12-13 DIAGNOSIS — J309 Allergic rhinitis, unspecified: Secondary | ICD-10-CM | POA: Diagnosis not present

## 2019-12-13 LAB — POCT CBC
Granulocyte percent: 68.7 %G (ref 37–80)
HCT, POC: 40.8 % (ref 29–41)
Hemoglobin: 13.1 g/dL (ref 11–14.6)
Lymph, poc: 1 (ref 0.6–3.4)
MCH, POC: 30.3 pg (ref 27–31.2)
MCHC: 32.1 g/dL (ref 31.8–35.4)
MCV: 94.2 fL (ref 76–111)
MID (cbc): 0.3 (ref 0–0.9)
MPV: 7.7 fL (ref 0–99.8)
POC Granulocyte: 2.8 (ref 2–6.9)
POC LYMPH PERCENT: 24.3 %L (ref 10–50)
POC MID %: 7 %M (ref 0–12)
Platelet Count, POC: 197 10*3/uL (ref 142–424)
RBC: 4.33 M/uL (ref 4.04–5.48)
RDW, POC: 12.5 %
WBC: 4.1 10*3/uL — AB (ref 4.6–10.2)

## 2019-12-13 MED ORDER — FLUTICASONE PROPIONATE 50 MCG/ACT NA SUSP: 1.0000 | Freq: Every day | NASAL | 6 refills | Status: DC

## 2019-12-13 NOTE — Patient Instructions (Signed)
° ° ° °  If you have lab work done today you will be contacted with your lab results within the next 2 weeks.  If you have not heard from us then please contact us. The fastest way to get your results is to register for My Chart. ° ° °IF you received an x-ray today, you will receive an invoice from [facility]. Please contact [facility] at [phone] with questions or concerns regarding your invoice.  ° °IF you received labwork today, you will receive an invoice from [facility]. Please contact [facility] at [phone] with questions or concerns regarding your invoice.  ° °Our billing staff will not be able to assist you with questions regarding bills from these companies. ° °You will be contacted with the lab results as soon as they are available. The fastest way to get your results is to activate your My Chart account. Instructions are located on the last page of this paperwork. If you have not heard from us regarding the results in 2 weeks, please contact this office. °  ° ° ° °

## 2019-12-13 NOTE — Progress Notes (Signed)
5/11/20212:05 PM  [name] 10/15/1955, 64 y.o., female [id number]  Chief Complaint  Patient presents with  . Abdominal Pain    around navel and RUQ , had nausea and vomiting x 1.5 week ago     HPI:   Patient is a 64 y.o. female with past medical history significant for HTN, HLP, dCHF, OSA on cpap, morbid obesity, GERD, pre-diabetes, migraines, fibromyalgia, depression, chronic neck painwho presents today forabd pain  Last OV feb 2021  About 2 weeks ago started having abdominal pain, around umbilicus, felt tender and hard, started having nausea and vomiting x 2 days She continues to have abd pain and nausea - using zofran Now also having mild RUQ pain Afraid to burp No diarrhea, constipations, black tarry stools Fever or chills Normal BMs Has been working on losing weight No known sick contacts  Lab Results  Component Value Date   HGBA1C 5.4 09/13/2019   HGBA1C 5.6 03/09/2019   HGBA1C 6.0 (H) 09/17/2018   Lab Results  Component Value Date   MICROALBUR 1.2 05/28/2015   LDLCALC 89 09/13/2019   CREATININE 0.93 09/13/2019    Depression screen PHQ 2/9 12/13/2019 12/06/2019 09/13/2019  Decreased Interest 0 0 0  Down, Depressed, Hopeless 0 0 0  PHQ - 2 Score 0 0 0  Altered sleeping - - -  Tired, decreased energy - - -  Change in appetite - - -  Feeling bad or failure about yourself  - - -  Trouble concentrating - - -  Moving slowly or fidgety/restless - - -  Suicidal thoughts - - -  PHQ-9 Score - - -  Difficult doing work/chores - - -  Some recent data might be hidden    Fall Risk  12/13/2019 12/06/2019 09/13/2019 06/13/2019 03/14/2019  Falls in the past year? 0 0 0 0 0  Number falls in past yr: 0 0 0 0 0  Injury with Fall? 0 0 0 0 0  Comment - - - - -  Follow up Falls evaluation completed Falls evaluation completed;Education provided - Falls evaluation completed -     Allergies  Allergen Reactions  . Sulfa Antibiotics Itching    Prior to Admission  medications   Medication Sig Start Date End Date Taking? Authorizing Provider  Albuterol Sulfate (PROAIR RESPICLICK) 123XX123 (90 Base) MCG/ACT AEPB Inhale 2 puffs into the lungs 4 (four) times daily as needed. 12/10/17  Yes Rutherford Guys, MD  atorvastatin (LIPITOR) 10 MG tablet TAKE 1 TABLET BY MOUTH ONCE DAILY AT 6PM 07/15/19  Yes Rutherford Guys, MD  butalbital-acetaminophen-caffeine [facility]) [phone] MG tablet Take 1-2 tablets by mouth every 6 (six) hours as needed for headache. 03/14/19 03/13/20 Yes Rutherford Guys, MD  cyclobenzaprine (FLEXERIL) 10 MG tablet TAKE 1/2-1 TABLET BY MOUTH 3 TIMES DAILY AS NEEDED FOR MUSCLE SPASMS. 09/13/19  Yes Rutherford Guys, MD  DEXILANT 60 MG capsule TAKE 1 CAPSULE BY MOUTH ONCE DAILY 07/15/19  Yes Rutherford Guys, MD  DULoxetine (CYMBALTA) 60 MG capsule TAKE 1 CAPSULE BY MOUTH ONCE DAILY 07/15/19  Yes Rutherford Guys, MD  fluticasone (FLONASE) 50 MCG/ACT nasal spray Place 1 spray into both nostrils daily. 01/07/19  Yes Wendie Agreste, MD  furosemide (LASIX) 20 MG tablet Take 1 tablet (20 mg total) by mouth 2 (two) times daily as needed. 09/13/19  Yes Rutherford Guys, MD  gabapentin (NEURONTIN) 300 MG capsule TAKE 1-2 CAPSULES BY MOUTH FOUR TIMES DAILY 07/15/19  Yes Grant Fontana  M, MD  lisinopril (ZESTRIL) 20 MG tablet TAKE 1 TABLET BY MOUTH ONCE DAILY 07/15/19  Yes Rutherford Guys, MD  ondansetron (ZOFRAN-ODT) 4 MG disintegrating tablet DISSOLVE 1 TABLET BY MOUTH EVERY 8 HOURS AS NEEDED FOR NAUSEA OR VOMITING. 01/07/19  Yes Wendie Agreste, MD  oxyCODONE-acetaminophen (PERCOCET/ROXICET) 5-325 MG tablet Take 1 tablet by mouth every 12 (twelve) hours as needed for severe pain. 09/13/19  Yes Rutherford Guys, MD  propranolol (INDERAL) 60 MG tablet TAKE 1 TABLET (60 MG TOTAL) BY MOUTH 3 (THREE) TIMES DAILY. 07/15/19  Yes Lendon Colonel, NP  sertraline (ZOLOFT) 100 MG tablet Take 1 tablet (100 mg total) by mouth daily. 09/13/19  Yes Rutherford Guys, MD    topiramate (TOPAMAX) 50 MG tablet TAKE 1 TABLET BY MOUTH TWICE DAILY 10/13/19  Yes Wendie Agreste, MD    Past Medical History:  Diagnosis Date  . Allergy    [name], Flonase  . Anemia   . Anxiety   . Arthritis   . Chronic kidney disease   . Colon polyps   . Depression   . Fibromyalgia   . Gastritis   . GERD (gastroesophageal reflux disease)   . Hernia, hiatal   . High risk for colon cancer   . HLD (hyperlipidemia)   . Hypertension   . Migraine   . Neuropathy   . Osteoporosis   . Pneumonia   . Sleep apnea    c-pap nightly  . Sleep apnea with use of continuous positive airway pressure (CPAP)   . Thyroid goiter     Past Surgical History:  Procedure Laterality Date  . 48-Hour Holter Monitor  06/2017   Mostly normal sinus rhythm.  Average heart rate 71 bpm.  Minimum 54 bpm.  Maximum sinus rate 114 bpm.  Rare PACs and PVCs.  No A. fib, atrial flutter, SVT or VT.  One brief run of 8 beat PAT.  Marland Kitchen ABDOMINAL HYSTERECTOMY     ovaries intact; DUB.  No dysplasia.  Marland Kitchen JOINT REPLACEMENT    . OPEN REDUCTION INTERNAL FIXATION (ORIF) DISTAL RADIAL FRACTURE Right 07/09/2017   Procedure: RIGHT WRIST OPEN REDUCTION INTERNAL FIXATION (ORIF) DISTAL RADIAL FRACTURE AND REPAIR AS INDICATED;  Surgeon: Iran Planas, MD;  Location: [place] [facility];  Service: Orthopedics;  Laterality: Right;  . PARTIAL HYSTERECTOMY    . ROTATOR CUFF REPAIR Right 2009  . SHOULDER SURGERY Right    shoulder dislocation, fall, and elbow unla nerve  . SHOULDER SURGERY Right 2015   labral tear  . TRANSTHORACIC ECHOCARDIOGRAM  06/2017    EF 55-60%.  grade 1 diastolic dysfunction.  Otherwise normal  . TUBAL LIGATION    . ULNAR NERVE REPAIR Right 08/19/2014    Social History   Tobacco Use  . Smoking status: Former Smoker    Quit date: 08/04/1998    Years since quitting: 21.3  . Smokeless tobacco: Never Used  Substance Use Topics  . Alcohol use: No    Family History  Problem Relation Age of Onset  . Heart disease  Mother 11       AMI/CAD/CHF as cause of death  . Hypertension Mother   . Hyperlipidemia Mother   . Hypothyroidism Mother   . Depression Mother   . Gout Mother   . Heart disease Father 35       AMI  . Hypertension Father   . Stroke Father 59       mild CVA  . Prostate cancer Father   .  Hyperlipidemia Father   . Cancer Father 68       prostate cancer  . Hypertension Brother   . Hyperlipidemia Brother   . Cancer Brother        prostate cancer  . Cancer Maternal Grandmother        type unknown  . Cancer Maternal Grandfather        type unknown  . Heart disease Paternal Grandmother   . Heart defect Paternal Grandfather   . Melanoma Brother   . Hyperlipidemia Brother   . Hypertension Brother   . Cancer Brother        melanoma  . Hypertension Brother   . Hyperlipidemia Brother   . Melanoma Brother   . Cancer Brother 25       melanoma  . Cancer Sister        Basal cell carcinoma scalp  . Depression Sister   . Hypertension Brother   . Depression Brother   . Hypertension Brother   . Gout Brother   . Arthritis Sister   . Depression Sister   . Colon cancer Neg Hx   . Esophageal cancer Neg Hx   . Stomach cancer Neg Hx   . Rectal cancer Neg Hx     ROS Per hpi  OBJECTIVE:  Today's Vitals   12/13/19 1349  BP: 118/77  Pulse: 66  Temp: 98 F (36.7 C)  SpO2: 95%  Weight: 261 lb (118.4 kg)  Height: 5\' 4"  (1.626 m)   Body mass index is 44.8 kg/m.   Physical Exam Vitals and nursing note reviewed.  Constitutional:      Appearance: She is well-developed.  HENT:     Head: Normocephalic and atraumatic.     Mouth/Throat:     Pharynx: No oropharyngeal exudate.  Eyes:     General: No scleral icterus.    Conjunctiva/sclera: Conjunctivae normal.     Pupils: Pupils are equal, round, and reactive to light.  Cardiovascular:     Rate and Rhythm: Normal rate and regular rhythm.     Heart sounds: Normal heart sounds. No murmur. No friction rub. No gallop.   Pulmonary:       Effort: Pulmonary effort is normal.     Breath sounds: Normal breath sounds. No wheezing or rales.  Abdominal:     General: Bowel sounds are increased. There is no distension.     Palpations: Abdomen is soft. There is no hepatomegaly or splenomegaly.     Tenderness: There is generalized abdominal tenderness (most prominent epigastric, RUQ pain). There is no guarding or rebound. Negative signs include Murphy's sign.  Musculoskeletal:     Cervical back: Neck supple.  Skin:    General: Skin is warm and dry.  Neurological:     Mental Status: She is alert and oriented to person, place, and time.     No results found for this or any previous visit (from the past 24 hour(s)).  No results found.   ASSESSMENT and PLAN  1. Right upper quadrant abdominal pain Improving. Low WBC - viral? Labs pending. Consider RUQ [place]. RTC precautions reviewed.  - POCT CBC - Comprehensive metabolic panel - Lipase  2. Allergic rhinitis, unspecified seasonality, unspecified trigger - fluticasone (FLONASE) 50 MCG/ACT nasal spray; Place 1 spray into both nostrils daily.  Return in about 3 months (around 03/14/2020) for routine .    Rutherford Guys, MD Primary Care at [place], [place] [address] Ph.  [phone] Fax [phone]

## 2019-12-14 LAB — COMPREHENSIVE METABOLIC PANEL
ALT: 11 IU/L (ref 0–32)
AST: 14 IU/L (ref 0–40)
Albumin/Globulin Ratio: 1.8 (ref 1.2–2.2)
Albumin: 4.1 g/dL (ref 3.8–4.8)
Alkaline Phosphatase: 84 IU/L (ref 39–117)
BUN/Creatinine Ratio: 8 — ABNORMAL LOW (ref 12–28)
BUN: 7 mg/dL — ABNORMAL LOW (ref 8–27)
Bilirubin Total: 0.2 mg/dL (ref 0.0–1.2)
CO2: 22 mmol/L (ref 20–29)
Calcium: 9 mg/dL (ref 8.7–10.3)
Chloride: 108 mmol/L — ABNORMAL HIGH (ref 96–106)
Creatinine, Ser: 0.87 mg/dL (ref 0.57–1.00)
GFR calc Af Amer: 82 mL/min/{1.73_m2} (ref >59)
GFR calc non Af Amer: 71 mL/min/{1.73_m2} (ref >59)
Globulin, Total: 2.3 g/dL (ref 1.5–4.5)
Glucose: 96 mg/dL (ref 65–99)
Potassium: 4.7 mmol/L (ref 3.5–5.2)
Sodium: 143 mmol/L (ref 134–144)
Total Protein: 6.4 g/dL (ref 6.0–8.5)

## 2019-12-14 LAB — LIPASE: Lipase: 18 U/L (ref 14–72)

## 2019-12-29 ENCOUNTER — Ambulatory Visit
Admission: RE | Admit: 2019-12-29 | Discharge: 2019-12-29 | Disposition: A | Discharge status: Home / Self Care | Payer: 59 | Source: Ambulatory Visit | Attending: Oncology | Admitting: Oncology

## 2019-12-29 ENCOUNTER — Other Ambulatory Visit: Payer: Self-pay

## 2019-12-29 DIAGNOSIS — D508 Other iron deficiency anemias: Secondary | ICD-10-CM

## 2019-12-29 DIAGNOSIS — I5032 Chronic diastolic (congestive) heart failure: Secondary | ICD-10-CM

## 2019-12-29 DIAGNOSIS — I5189 Other ill-defined heart diseases: Secondary | ICD-10-CM

## 2019-12-29 DIAGNOSIS — Z1231 Encounter for screening mammogram for malignant neoplasm of breast: Secondary | ICD-10-CM | POA: Diagnosis not present

## 2020-01-06 ENCOUNTER — Other Ambulatory Visit: Payer: Self-pay | Admitting: Family Medicine

## 2020-01-06 DIAGNOSIS — R110 Nausea: Secondary | ICD-10-CM

## 2020-01-06 MED FILL — SERTRALINE HCL 100 MG TAB: 100 | 90 days supply | Qty: 90 | Fill #1

## 2020-01-06 MED FILL — CYCLOBENZAPRINE HCL 10 MG T: 10 | 60 days supply | Qty: 180 | Fill #1

## 2020-01-06 MED FILL — ONDANSETRON ODT 4 MG TABLET: 4 | 7 days supply | Qty: 20 | Fill #0

## 2020-01-20 ENCOUNTER — Other Ambulatory Visit: Payer: Self-pay | Admitting: Family Medicine

## 2020-01-20 DIAGNOSIS — F32A Depression, unspecified: Secondary | ICD-10-CM

## 2020-01-20 DIAGNOSIS — E785 Hyperlipidemia, unspecified: Secondary | ICD-10-CM

## 2020-01-20 DIAGNOSIS — I10 Essential (primary) hypertension: Secondary | ICD-10-CM

## 2020-01-20 DIAGNOSIS — K219 Gastro-esophageal reflux disease without esophagitis: Secondary | ICD-10-CM

## 2020-01-20 DIAGNOSIS — M792 Neuralgia and neuritis, unspecified: Secondary | ICD-10-CM

## 2020-01-20 DIAGNOSIS — M797 Fibromyalgia: Secondary | ICD-10-CM

## 2020-01-20 MED FILL — DEXILANT DR 60 MG CAPSULE: 60 | 90 days supply | Qty: 90 | Fill #0

## 2020-01-20 MED FILL — GABAPENTIN 300 MG CAPSULE: 300 | 68 days supply | Qty: 540 | Fill #0

## 2020-01-20 MED FILL — ATORVASTATIN CALCIUM 10 MG: 10 | 90 days supply | Qty: 90 | Fill #0

## 2020-01-20 MED FILL — LISINOPRIL 20 MG TABLET: 20 | 90 days supply | Qty: 90 | Fill #0

## 2020-01-20 MED FILL — DULoxetine HCL 60 MG CPEP: 60 | 90 days supply | Qty: 90 | Fill #0

## 2020-01-20 MED FILL — TOPIRAMATE 50 MG TABLET: 50 | 90 days supply | Qty: 180 | Fill #1

## 2020-01-20 MED FILL — PROPRANOLOL 60 MG TABLET: 60 | 90 days supply | Qty: 270 | Fill #2

## 2020-03-13 ENCOUNTER — Ambulatory Visit: Payer: 59 | Admitting: Family Medicine

## 2020-03-13 ENCOUNTER — Ambulatory Visit (INDEPENDENT_AMBULATORY_CARE_PROVIDER_SITE_OTHER): Payer: 59

## 2020-03-13 ENCOUNTER — Encounter: Payer: Self-pay | Admitting: Family Medicine

## 2020-03-13 ENCOUNTER — Other Ambulatory Visit: Payer: Self-pay

## 2020-03-13 VITALS — BP 122/75 | HR 68 | Temp 98.1°F | Ht 64.0 in | Wt 260.6 lb

## 2020-03-13 DIAGNOSIS — R300 Dysuria: Secondary | ICD-10-CM

## 2020-03-13 DIAGNOSIS — M419 Scoliosis, unspecified: Secondary | ICD-10-CM | POA: Diagnosis not present

## 2020-03-13 DIAGNOSIS — N3001 Acute cystitis with hematuria: Secondary | ICD-10-CM

## 2020-03-13 DIAGNOSIS — Z87442 Personal history of urinary calculi: Secondary | ICD-10-CM | POA: Diagnosis not present

## 2020-03-13 LAB — POCT URINALYSIS DIP (MANUAL ENTRY)
Glucose, UA: NEGATIVE mg/dL
Ketones, POC UA: NEGATIVE mg/dL
Nitrite, UA: NEGATIVE
Protein Ur, POC: 100 mg/dL — AB
Spec Grav, UA: 1.02 (ref 1.010–1.025)
Urobilinogen, UA: 2 E.U./dL — AB
pH, UA: 7 (ref 5.0–8.0)

## 2020-03-13 LAB — POC MICROSCOPIC URINALYSIS (UMFC): Mucus: ABSENT

## 2020-03-13 MED ORDER — NITROFURANTOIN MONOHYD MACRO 100 MG PO CAPS
100.0000 mg | ORAL_CAPSULE | Freq: Two times a day (BID) | ORAL | 0 refills | Status: DC
Start: 2020-03-13 — End: 2020-04-03

## 2020-03-13 MED FILL — NITROFURANTOIN MONO-MCR 100: 100 | 7 days supply | Qty: 14 | Fill #0

## 2020-03-13 NOTE — Progress Notes (Signed)
8/10/20211:35 PM  [name] October 26, 1955, 64 y.o., female [id number]  Chief Complaint  Patient presents with  . Follow-up    having painful urination since Friday    HPI:   Patient is a 64 y.o. female with past medical history significant for HTN, HLP, dCHF, OSA on cpap, morbid obesity, GERD, pre-diabetes, migraines, fibromyalgia, depression, chronic neck pain who presents today for dysuria x 4 days  Last OV may 2021 Having urinary pressure and pain for past 4 days Denies any blood Having mild right flank pain Reports she drinks plenty of water Denies any h/o kidney stone Today back not hurting, but having more pressure She denies any fever or chills, but having mild malaise Denies any nausea, vomiting  She is requesting of renewal of disability placard Uses it for SOB/DOE    Depression screen [facility] 2/9 12/13/2019 12/06/2019 09/13/2019  Decreased Interest 0 0 0  Down, Depressed, Hopeless 0 0 0  PHQ - 2 Score 0 0 0  Altered sleeping - - -  Tired, decreased energy - - -  Change in appetite - - -  Feeling bad or failure about yourself  - - -  Trouble concentrating - - -  Moving slowly or fidgety/restless - - -  Suicidal thoughts - - -  PHQ-9 Score - - -  Difficult doing work/chores - - -  Some recent data might be hidden    Fall Risk  03/13/2020 12/13/2019 12/06/2019 09/13/2019 06/13/2019  Falls in the past year? 0 0 0 0 0  Number falls in past yr: 0 0 0 0 0  Injury with Fall? 0 0 0 0 0  Comment - - - - -  Follow up - Falls evaluation completed Falls evaluation completed;Education provided - Falls evaluation completed     Allergies  Allergen Reactions  . Sulfa Antibiotics Itching    Prior to Admission medications   Medication Sig Start Date End Date Taking? Authorizing Provider  Albuterol Sulfate (PROAIR RESPICLICK) 716 (90 Base) MCG/ACT AEPB Inhale 2 puffs into the lungs 4 (four) times daily as needed. 12/10/17  Yes Rutherford Guys, MD  atorvastatin (LIPITOR) 10  MG tablet TAKE 1 TABLET BY MOUTH ONCE DAILY AT 6PM 01/20/20  Yes Rutherford Guys, MD  butalbital-acetaminophen-caffeine [employer]) [phone] MG tablet Take 1-2 tablets by mouth every 6 (six) hours as needed for headache. 03/14/19 03/13/20 Yes Rutherford Guys, MD  cyclobenzaprine (FLEXERIL) 10 MG tablet TAKE 1/2-1 TABLET BY MOUTH 3 TIMES DAILY AS NEEDED FOR MUSCLE SPASMS. 09/13/19  Yes Rutherford Guys, MD  DEXILANT 60 MG capsule TAKE 1 CAPSULE BY MOUTH ONCE DAILY 01/20/20  Yes Rutherford Guys, MD  DULoxetine (CYMBALTA) 60 MG capsule TAKE 1 CAPSULE BY MOUTH ONCE DAILY 01/20/20  Yes Rutherford Guys, MD  fluticasone (FLONASE) 50 MCG/ACT nasal spray Place 1 spray into both nostrils daily. 12/13/19  Yes Rutherford Guys, MD  furosemide (LASIX) 20 MG tablet Take 1 tablet (20 mg total) by mouth 2 (two) times daily as needed. 09/13/19  Yes Rutherford Guys, MD  gabapentin (NEURONTIN) 300 MG capsule TAKE 1 TO 2 CAPSULES BY MOUTH 4 TIMES A DAY 01/20/20  Yes Rutherford Guys, MD  lisinopril (ZESTRIL) 20 MG tablet TAKE 1 TABLET BY MOUTH ONCE DAILY 01/20/20  Yes Rutherford Guys, MD  ondansetron (ZOFRAN-ODT) 4 MG disintegrating tablet DISSOLVE 1 TABLET BY MOUTH EVERY 8 HOURS AS NEEDED FOR NAUSEA OR VOMITING. 01/06/20  Yes Rutherford Guys, MD  oxyCODONE-acetaminophen (PERCOCET/ROXICET) 5-325 MG tablet Take 1 tablet by mouth every 12 (twelve) hours as needed for severe pain. 09/13/19  Yes Rutherford Guys, MD  propranolol (INDERAL) 60 MG tablet TAKE 1 TABLET (60 MG TOTAL) BY MOUTH 3 (THREE) TIMES DAILY. 07/15/19  Yes Lendon Colonel, NP  sertraline (ZOLOFT) 100 MG tablet Take 1 tablet (100 mg total) by mouth daily. 09/13/19  Yes Rutherford Guys, MD  topiramate (TOPAMAX) 50 MG tablet TAKE 1 TABLET BY MOUTH TWICE DAILY 10/13/19  Yes Wendie Agreste, MD    Past Medical History:  Diagnosis Date  . Allergy    [name], Flonase  . Anemia   . Anxiety   . Arthritis   . Chronic kidney disease   . Colon polyps   .  Depression   . Fibromyalgia   . Gastritis   . GERD (gastroesophageal reflux disease)   . Hernia, hiatal   . High risk for colon cancer   . HLD (hyperlipidemia)   . Hypertension   . Migraine   . Neuropathy   . Osteoporosis   . Pneumonia   . Sleep apnea    c-pap nightly  . Sleep apnea with use of continuous positive airway pressure (CPAP)   . Thyroid goiter     Past Surgical History:  Procedure Laterality Date  . 48-Hour Holter Monitor  06/2017   Mostly normal sinus rhythm.  Average heart rate 71 bpm.  Minimum 54 bpm.  Maximum sinus rate 114 bpm.  Rare PACs and PVCs.  No A. fib, atrial flutter, SVT or VT.  One brief run of 8 beat PAT.  Marland Kitchen ABDOMINAL HYSTERECTOMY     ovaries intact; DUB.  No dysplasia.  Marland Kitchen JOINT REPLACEMENT    . OPEN REDUCTION INTERNAL FIXATION (ORIF) DISTAL RADIAL FRACTURE Right 07/09/2017   Procedure: RIGHT WRIST OPEN REDUCTION INTERNAL FIXATION (ORIF) DISTAL RADIAL FRACTURE AND REPAIR AS INDICATED;  Surgeon: Iran Planas, MD;  Location: [place];  Service: Orthopedics;  Laterality: Right;  . PARTIAL HYSTERECTOMY    . ROTATOR CUFF REPAIR Right 2009  . SHOULDER SURGERY Right    shoulder dislocation, fall, and elbow unla nerve  . SHOULDER SURGERY Right 2015   labral tear  . TRANSTHORACIC ECHOCARDIOGRAM  06/2017    EF 55-60%.  grade 1 diastolic dysfunction.  Otherwise normal  . TUBAL LIGATION    . ULNAR NERVE REPAIR Right 08/19/2014    Social History   Tobacco Use  . Smoking status: Former Smoker    Quit date: 08/04/1998    Years since quitting: 21.6  . Smokeless tobacco: Never Used  Substance Use Topics  . Alcohol use: No    Family History  Problem Relation Age of Onset  . Heart disease Mother 46       AMI/CAD/CHF as cause of death  . Hypertension Mother   . Hyperlipidemia Mother   . Hypothyroidism Mother   . Depression Mother   . Gout Mother   . Heart disease Father 38       AMI  . Hypertension Father   . Stroke Father 69       mild CVA  .  Prostate cancer Father   . Hyperlipidemia Father   . Cancer Father 43       prostate cancer  . Hypertension Brother   . Hyperlipidemia Brother   . Cancer Brother        prostate cancer  . Cancer Maternal Grandmother  type unknown  . Cancer Maternal Grandfather        type unknown  . Heart disease Paternal Grandmother   . Heart defect Paternal Grandfather   . Melanoma Brother   . Hyperlipidemia Brother   . Hypertension Brother   . Cancer Brother        melanoma  . Hypertension Brother   . Hyperlipidemia Brother   . Melanoma Brother   . Cancer Brother 25       melanoma  . Cancer Sister        Basal cell carcinoma scalp  . Depression Sister   . Hypertension Brother   . Depression Brother   . Hypertension Brother   . Gout Brother   . Arthritis Sister   . Depression Sister   . Colon cancer Neg Hx   . Esophageal cancer Neg Hx   . Stomach cancer Neg Hx   . Rectal cancer Neg Hx     ROS Per hpi  OBJECTIVE:  Today's Vitals   03/13/20 1330  BP: 122/75  Pulse: 68  Temp: 98.1 F (36.7 C)  SpO2: 95%  Weight: 260 lb 9.6 oz (118.2 kg)  Height: 5\' 4"  (1.626 m)   Body mass index is 44.73 kg/m.   Wt Readings from Last 3 Encounters:  03/13/20 260 lb 9.6 oz (118.2 kg)  12/13/19 261 lb (118.4 kg)  12/06/19 264 lb (119.7 kg)     Physical Exam Vitals and nursing note reviewed.  Constitutional:      Appearance: She is well-developed.  HENT:     Head: Normocephalic and atraumatic.     Mouth/Throat:     Pharynx: No oropharyngeal exudate.  Eyes:     General: No scleral icterus.    Extraocular Movements: Extraocular movements intact.     Conjunctiva/sclera: Conjunctivae normal.     Pupils: Pupils are equal, round, and reactive to light.  Cardiovascular:     Rate and Rhythm: Normal rate and regular rhythm.     Heart sounds: Normal heart sounds. No murmur heard.  No friction rub. No gallop.   Pulmonary:     Effort: Pulmonary effort is normal.     Breath  sounds: Normal breath sounds. No wheezing, rhonchi or rales.  Abdominal:     General: Bowel sounds are normal.     Palpations: Abdomen is soft.     Tenderness: There is abdominal tenderness (suprapubic). There is no right CVA tenderness, left CVA tenderness, guarding or rebound.  Musculoskeletal:     Cervical back: Neck supple.  Skin:    General: Skin is warm and dry.  Neurological:     Mental Status: She is alert and oriented to person, place, and time.     Results for orders placed or performed in visit on 03/13/20 (from the past 24 hour(s))  POCT urinalysis dipstick     Status: Abnormal   Collection Time: 03/13/20  1:55 PM  Result Value Ref Range   Color, UA yellow yellow   Clarity, UA clear clear   Glucose, UA negative negative mg/dL   Bilirubin, UA small (A) negative   Ketones, POC UA negative negative mg/dL   Spec Grav, UA 1.020 1.010 - 1.025   Blood, UA large (A) negative   pH, UA 7.0 5.0 - 8.0   Protein Ur, POC =100 (A) negative mg/dL   Urobilinogen, UA 2.0 (A) 0.2 or 1.0 E.U./dL   Nitrite, UA Negative Negative   Leukocytes, UA Small (1+) (A) Negative  POCT Microscopic  Urinalysis ([facility])     Status: Abnormal   Collection Time: 03/13/20  1:56 PM  Result Value Ref Range   WBC,UR,HPF,POC Too numerous to count  (A) None WBC/hpf   RBC,UR,HPF,POC Many (A) None RBC/hpf   Bacteria Few (A) None, Too numerous to count   Mucus Absent Absent   Epithelial Cells, UR Per Microscopy Few (A) None, Too numerous to count cells/hpf    DG Abd 2 Views  Result Date: 03/13/2020 CLINICAL DATA:  Kidney stones EXAM: ABDOMEN - 2 VIEW COMPARISON:  None. FINDINGS: Scattered large and small bowel gas is noted. No abnormal mass or abnormal calcifications are seen. No free air is noted. No acute bony abnormality is noted. Degenerative scoliosis concave to the right is seen. IMPRESSION: No renal calculi are noted.  No other focal abnormality is seen. Electronically Signed   By: Inez Catalina M.D.   On:  03/13/2020 14:19     ASSESSMENT and PLAN  1. Acute cystitis with hematuria Discussed supportive measures, new meds r/se/b and RTC precautions. Patient educational handout given.  2. Dysuria - POCT urinalysis dipstick - POCT Microscopic Urinalysis ([facility]) - DG Abd 2 Views - Urine Culture  Other orders - nitrofurantoin, macrocrystal-monohydrate, (MACROBID) 100 MG capsule; Take 1 capsule (100 mg total) by mouth 2 (two) times daily.  Return in about 3 months (around 06/13/2020). for routine chronic medical conditions    Ananiah Maciolek Reubin Milan, MD Primary Care at [facility] [place], [place] [address] Ph.  [phone] Fax [phone]

## 2020-03-13 NOTE — Patient Instructions (Addendum)
pcp      If you have lab work done today you will be contacted with your lab results within the next 2 weeks.  If you have not heard from [place] then please contact us. The fastest way to get your results is to register for My Chart.   IF you received an x-ray today, you will receive an invoice from [facility]. Please contact [facility] at [phone] with questions or concerns regarding your invoice.   IF you received labwork today, you will receive an invoice from [place]. Please contact [facility] at [phone] with questions or concerns regarding your invoice.   Our billing staff will not be able to assist you with questions regarding bills from these companies.  You will be contacted with the lab results as soon as they are available. The fastest way to get your results is to activate your My Chart account. Instructions are located on the last page of this paperwork. If you have not heard from [place] regarding the results in 2 weeks, please contact this office.     Urinary Tract Infection, Adult A urinary tract infection (UTI) is an infection of any part of the urinary tract. The urinary tract includes:  The kidneys.  The ureters.  The bladder.  The urethra. These organs make, store, and get rid of pee (urine) in the body. What are the causes? This is caused by germs (bacteria) in your genital area. These germs grow and cause swelling (inflammation) of your urinary tract. What increases the risk? You are more likely to develop this condition if:  You have a small, thin tube (catheter) to drain pee.  You cannot control when you pee or poop (incontinence).  You are female, and: ? You use these methods to prevent pregnancy:  A medicine that kills sperm (spermicide).  A device that blocks sperm (diaphragm). ? You have low levels of a female hormone (estrogen). ? You are pregnant.  You have genes that add to your risk.  You are sexually active.  You  take antibiotic medicines.  You have trouble peeing because of: ? A prostate that is bigger than normal, if you are female. ? A blockage in the part of your body that drains pee from the bladder (urethra). ? A kidney stone. ? A nerve condition that affects your bladder (neurogenic bladder). ? Not getting enough to drink. ? Not peeing often enough.  You have other conditions, such as: ? Diabetes. ? A weak disease-fighting system (immune system). ? Sickle cell disease. ? Gout. ? Injury of the spine. What are the signs or symptoms? Symptoms of this condition include:  Needing to pee right away (urgently).  Peeing often.  Peeing small amounts often.  Pain or burning when peeing.  Blood in the pee.  Pee that smells bad or not like normal.  Trouble peeing.  Pee that is cloudy.  Fluid coming from the vagina, if you are female.  Pain in the belly or lower back. Other symptoms include:  Throwing up (vomiting).  No urge to eat.  Feeling mixed up (confused).  Being tired and grouchy (irritable).  A fever.  Watery poop (diarrhea). How is this treated? This condition may be treated with:  Antibiotic medicine.  Other medicines.  Drinking enough water. Follow these instructions at home:  Medicines  Take over-the-counter and prescription medicines only as told by your doctor.  If you were prescribed an antibiotic medicine, take it as told by your doctor. Do not stop taking  it even if you start to feel better. General instructions  Make sure you: ? Pee until your bladder is empty. ? Do not hold pee for a long time. ? Empty your bladder after sex. ? Wipe from front to back after pooping if you are a female. Use each tissue one time when you wipe.  Drink enough fluid to keep your pee pale yellow.  Keep all follow-up visits as told by your doctor. This is important. Contact a doctor if:  You do not get better after 1-2 days.  Your symptoms go away and then  come back. Get help right away if:  You have very bad back pain.  You have very bad pain in your lower belly.  You have a fever.  You are sick to your stomach (nauseous).  You are throwing up. Summary  A urinary tract infection (UTI) is an infection of any part of the urinary tract.  This condition is caused by germs in your genital area.  There are many risk factors for a UTI. These include having a small, thin tube to drain pee and not being able to control when you pee or poop.  Treatment includes antibiotic medicines for germs.  Drink enough fluid to keep your pee pale yellow. This information is not intended to replace advice given to you by your health care provider. Make sure you discuss any questions you have with your health care provider. Document Revised: 07/08/2018 Document Reviewed: 01/28/2018 Elsevier Patient Education  2020 [employer].

## 2020-03-14 LAB — URINE CULTURE: Organism ID, Bacteria: NO GROWTH

## 2020-04-01 ENCOUNTER — Encounter: Payer: Self-pay | Admitting: Family Medicine

## 2020-04-01 DIAGNOSIS — R1030 Lower abdominal pain, unspecified: Secondary | ICD-10-CM

## 2020-04-02 ENCOUNTER — Ambulatory Visit
Admission: RE | Admit: 2020-04-02 | Discharge: 2020-04-02 | Disposition: A | Discharge status: Home / Self Care | Payer: 59 | Source: Ambulatory Visit | Attending: Family Medicine | Admitting: Family Medicine

## 2020-04-02 DIAGNOSIS — N2889 Other specified disorders of kidney and ureter: Secondary | ICD-10-CM | POA: Diagnosis not present

## 2020-04-02 DIAGNOSIS — R1030 Lower abdominal pain, unspecified: Secondary | ICD-10-CM

## 2020-04-02 DIAGNOSIS — K449 Diaphragmatic hernia without obstruction or gangrene: Secondary | ICD-10-CM | POA: Diagnosis not present

## 2020-04-02 DIAGNOSIS — R109 Unspecified abdominal pain: Secondary | ICD-10-CM | POA: Diagnosis not present

## 2020-04-02 NOTE — Telephone Encounter (Signed)
Pt saw you for UTI possible kidney stone pt reports finishing abx and pain in her back has returned and worsened should she make a new appointment to be reevaluated

## 2020-04-03 ENCOUNTER — Other Ambulatory Visit: Payer: Self-pay

## 2020-04-03 ENCOUNTER — Encounter: Payer: Self-pay | Admitting: Family Medicine

## 2020-04-03 ENCOUNTER — Ambulatory Visit (INDEPENDENT_AMBULATORY_CARE_PROVIDER_SITE_OTHER): Payer: 59 | Admitting: Family Medicine

## 2020-04-03 ENCOUNTER — Telehealth: Payer: Self-pay | Admitting: Family Medicine

## 2020-04-03 VITALS — BP 133/80 | HR 62 | Temp 98.0°F | Ht 64.0 in | Wt 253.0 lb

## 2020-04-03 DIAGNOSIS — N2889 Other specified disorders of kidney and ureter: Secondary | ICD-10-CM

## 2020-04-03 DIAGNOSIS — G8929 Other chronic pain: Secondary | ICD-10-CM

## 2020-04-03 DIAGNOSIS — N289 Disorder of kidney and ureter, unspecified: Secondary | ICD-10-CM | POA: Diagnosis not present

## 2020-04-03 DIAGNOSIS — M546 Pain in thoracic spine: Secondary | ICD-10-CM

## 2020-04-03 MED ORDER — TRAMADOL HCL 50 MG PO TABS
50.0000 mg | ORAL_TABLET | Freq: Three times a day (TID) | ORAL | 0 refills | Status: AC | PRN
Start: 2020-04-03 — End: 2020-04-08

## 2020-04-03 MED ORDER — ALBUTEROL SULFATE HFA 108 (90 BASE) MCG/ACT IN AERS: 2.0000 | INHALATION_SPRAY | Freq: Four times a day (QID) | RESPIRATORY_TRACT | 0 refills | Status: DC | PRN

## 2020-04-03 MED ORDER — PROAIR RESPICLICK 108 (90 BASE) MCG/ACT IN AEPB: 2.0000 | INHALATION_SPRAY | Freq: Four times a day (QID) | RESPIRATORY_TRACT | 1 refills | Status: DC | PRN

## 2020-04-03 MED FILL — ALBUTEROL SULFATE HFA 108 (: 108 (90 BAS | 25 days supply | Qty: 9 | Fill #0

## 2020-04-03 MED FILL — traMADol HCL 50 MG TABS: 50 | 5 days supply | Qty: 15 | Fill #0

## 2020-04-03 NOTE — Telephone Encounter (Signed)
[facility] is needing pt's Albuterol Sulfate (PROAIR RESPICLICK) 927 (90 Base) MCG/ACT AEPB [[id number]] switched to Plain Proair or Provental. Her insurance no longer covers what we put in. Pharmacy  [place], [place] - [address] [facility].  [address] [place] [address]  Phone:  [phone] Fax:  [phone]

## 2020-04-03 NOTE — Patient Instructions (Signed)
° ° ° °  If you have lab work done today you will be contacted with your lab results within the next 2 weeks.  If you have not heard from us then please contact us. The fastest way to get your results is to register for My Chart. ° ° °IF you received an x-ray today, you will receive an invoice from [facility]. Please contact  Radiology at [phone] with questions or concerns regarding your invoice.  ° °IF you received labwork today, you will receive an invoice from [facility]. Please contact [facility] at [phone] with questions or concerns regarding your invoice.  ° °Our billing staff will not be able to assist you with questions regarding bills from these companies. ° °You will be contacted with the lab results as soon as they are available. The fastest way to get your results is to activate your My Chart account. Instructions are located on the last page of this paperwork. If you have not heard from us regarding the results in 2 weeks, please contact this office. °  ° ° ° °

## 2020-04-03 NOTE — Progress Notes (Signed)
8/31/20214:18 PM  [name] 1956-06-05, 64 y.o., female [id number]  Chief Complaint  Patient presents with  . Pain    still having pain on right side, ct is resulted. Drinking plenty of water and the pain pill as directed.    HPI:   Patient is a 64 y.o. female with past medical history significant for HTN, HLP, dCHF, OSA on cpap, morbid obesity, GERD, pre-diabetes, migraines, fibromyalgia, depression, chronic neck painwho presents today for right flank pain  Last OV 20 days ago - thought might have kidney stones CT ordered - no stones but indeterminate bilateral small renal masses, recommend MRI   She continues to have recurrent right flank pain She has been taking flexeril which helps her shoulder/neck pain spasms but not helping flank pain Has been taking ibuprofen 800mg    Has been using albuterol before exercising or walking and it has been helping with her breathing sign   Lab Results  Component Value Date   CREATININE 0.87 12/13/2019   BUN 7 (L) 12/13/2019   NA 143 12/13/2019   K 4.7 12/13/2019   CL 108 (H) 12/13/2019   CO2 22 12/13/2019    Wt Readings from Last 3 Encounters:  04/03/20 253 lb (114.8 kg)  03/13/20 260 lb 9.6 oz (118.2 kg)  12/13/19 261 lb (118.4 kg)    Depression screen [facility] 2/9 12/13/2019 12/06/2019 09/13/2019  Decreased Interest 0 0 0  Down, Depressed, Hopeless 0 0 0  PHQ - 2 Score 0 0 0  Altered sleeping - - -  Tired, decreased energy - - -  Change in appetite - - -  Feeling bad or failure about yourself  - - -  Trouble concentrating - - -  Moving slowly or fidgety/restless - - -  Suicidal thoughts - - -  PHQ-9 Score - - -  Difficult doing work/chores - - -  Some recent data might be hidden    Fall Risk  03/13/2020 12/13/2019 12/06/2019 09/13/2019 06/13/2019  Falls in the past year? 0 0 0 0 0  Number falls in past yr: 0 0 0 0 0  Injury with Fall? 0 0 0 0 0  Comment - - - - -  Follow up - Falls evaluation completed Falls evaluation  completed;Education provided - Falls evaluation completed     Allergies  Allergen Reactions  . Sulfa Antibiotics Itching    Prior to Admission medications   Medication Sig Start Date End Date Taking? Authorizing Provider  Albuterol Sulfate (PROAIR RESPICLICK) 353 (90 Base) MCG/ACT AEPB Inhale 2 puffs into the lungs 4 (four) times daily as needed. 12/10/17  Yes Rutherford Guys, MD  atorvastatin (LIPITOR) 10 MG tablet TAKE 1 TABLET BY MOUTH ONCE DAILY AT 6PM 01/20/20  Yes Rutherford Guys, MD  cyclobenzaprine (FLEXERIL) 10 MG tablet TAKE 1/2-1 TABLET BY MOUTH 3 TIMES DAILY AS NEEDED FOR MUSCLE SPASMS. 09/13/19  Yes Rutherford Guys, MD  DEXILANT 60 MG capsule TAKE 1 CAPSULE BY MOUTH ONCE DAILY 01/20/20  Yes Rutherford Guys, MD  DULoxetine (CYMBALTA) 60 MG capsule TAKE 1 CAPSULE BY MOUTH ONCE DAILY 01/20/20  Yes Rutherford Guys, MD  fluticasone [facility]) 50 MCG/ACT nasal spray Place 1 spray into both nostrils daily. 12/13/19  Yes Rutherford Guys, MD  furosemide (LASIX) 20 MG tablet Take 1 tablet (20 mg total) by mouth 2 (two) times daily as needed. 09/13/19  Yes Rutherford Guys, MD  gabapentin (NEURONTIN) 300 MG capsule TAKE 1 TO 2  CAPSULES BY MOUTH 4 TIMES A DAY 01/20/20  Yes Rutherford Guys, MD  lisinopril (ZESTRIL) 20 MG tablet TAKE 1 TABLET BY MOUTH ONCE DAILY 01/20/20  Yes Rutherford Guys, MD  ondansetron (ZOFRAN-ODT) 4 MG disintegrating tablet DISSOLVE 1 TABLET BY MOUTH EVERY 8 HOURS AS NEEDED FOR NAUSEA OR VOMITING. 01/06/20  Yes Rutherford Guys, MD  oxyCODONE-acetaminophen (PERCOCET/ROXICET) 5-325 MG tablet Take 1 tablet by mouth every 12 (twelve) hours as needed for severe pain. 09/13/19  Yes Rutherford Guys, MD  propranolol (INDERAL) 60 MG tablet TAKE 1 TABLET (60 MG TOTAL) BY MOUTH 3 (THREE) TIMES DAILY. 07/15/19  Yes Lendon Colonel, NP  sertraline (ZOLOFT) 100 MG tablet Take 1 tablet (100 mg total) by mouth daily. 09/13/19  Yes Rutherford Guys, MD  topiramate (TOPAMAX) 50 MG tablet TAKE  1 TABLET BY MOUTH TWICE DAILY 10/13/19  Yes Wendie Agreste, MD    Past Medical History:  Diagnosis Date  . Allergy    [name], Flonase  . Anemia   . Anxiety   . Arthritis   . Chronic kidney disease   . Colon polyps   . Depression   . Fibromyalgia   . Gastritis   . GERD (gastroesophageal reflux disease)   . Hernia, hiatal   . High risk for colon cancer   . HLD (hyperlipidemia)   . Hypertension   . Migraine   . Neuropathy   . Osteoporosis   . Pneumonia   . Sleep apnea    c-pap nightly  . Sleep apnea with use of continuous positive airway pressure (CPAP)   . Thyroid goiter     Past Surgical History:  Procedure Laterality Date  . 48-Hour Holter Monitor  06/2017   Mostly normal sinus rhythm.  Average heart rate 71 bpm.  Minimum 54 bpm.  Maximum sinus rate 114 bpm.  Rare PACs and PVCs.  No A. fib, atrial flutter, SVT or VT.  One brief run of 8 beat PAT.  Marland Kitchen ABDOMINAL HYSTERECTOMY     ovaries intact; DUB.  No dysplasia.  Marland Kitchen JOINT REPLACEMENT    . OPEN REDUCTION INTERNAL FIXATION (ORIF) DISTAL RADIAL FRACTURE Right 07/09/2017   Procedure: RIGHT WRIST OPEN REDUCTION INTERNAL FIXATION (ORIF) DISTAL RADIAL FRACTURE AND REPAIR AS INDICATED;  Surgeon: Iran Planas, MD;  Location: [place];  Service: Orthopedics;  Laterality: Right;  . PARTIAL HYSTERECTOMY    . ROTATOR CUFF REPAIR Right 2009  . SHOULDER SURGERY Right    shoulder dislocation, fall, and elbow unla nerve  . SHOULDER SURGERY Right 2015   labral tear  . TRANSTHORACIC ECHOCARDIOGRAM  06/2017    EF 55-60%.  grade 1 diastolic dysfunction.  Otherwise normal  . TUBAL LIGATION    . ULNAR NERVE REPAIR Right 08/19/2014    Social History   Tobacco Use  . Smoking status: Former Smoker    Quit date: 08/04/1998    Years since quitting: 21.6  . Smokeless tobacco: Never Used  Substance Use Topics  . Alcohol use: No    Family History  Problem Relation Age of Onset  . Heart disease Mother 95       AMI/CAD/CHF as cause of  death  . Hypertension Mother   . Hyperlipidemia Mother   . Hypothyroidism Mother   . Depression Mother   . Gout Mother   . Heart disease Father 37       AMI  . Hypertension Father   . Stroke Father 58  mild CVA  . Prostate cancer Father   . Hyperlipidemia Father   . Cancer Father 83       prostate cancer  . Hypertension Brother   . Hyperlipidemia Brother   . Cancer Brother        prostate cancer  . Cancer Maternal Grandmother        type unknown  . Cancer Maternal Grandfather        type unknown  . Heart disease Paternal Grandmother   . Heart defect Paternal Grandfather   . Melanoma Brother   . Hyperlipidemia Brother   . Hypertension Brother   . Cancer Brother        melanoma  . Hypertension Brother   . Hyperlipidemia Brother   . Melanoma Brother   . Cancer Brother 25       melanoma  . Cancer Sister        Basal cell carcinoma scalp  . Depression Sister   . Hypertension Brother   . Depression Brother   . Hypertension Brother   . Gout Brother   . Arthritis Sister   . Depression Sister   . Colon cancer Neg Hx   . Esophageal cancer Neg Hx   . Stomach cancer Neg Hx   . Rectal cancer Neg Hx     ROS   OBJECTIVE:  Today's Vitals   04/03/20 1615  BP: 133/80  Pulse: 62  Temp: 98 F (36.7 C)  SpO2: 93%  Weight: 253 lb (114.8 kg)  Height: 5\' 4"  (1.626 m)   Body mass index is 43.43 kg/m.   Physical Exam Vitals and nursing note reviewed.  Constitutional:      Appearance: She is well-developed.  HENT:     Head: Normocephalic and atraumatic.  Eyes:     General: No scleral icterus.    Conjunctiva/sclera: Conjunctivae normal.     Pupils: Pupils are equal, round, and reactive to light.  Pulmonary:     Effort: Pulmonary effort is normal.  Musculoskeletal:     Cervical back: Neck supple.     Thoracic back: Spasms and tenderness present. No bony tenderness. Normal range of motion.  Skin:    General: Skin is warm and dry.  Neurological:     Mental  Status: She is alert and oriented to person, place, and time.     No results found for this or Melissa previous visit (from the past 24 hour(s)).  No results found.   ASSESSMENT and PLAN  1. Chronic right-sided thoracic back pain Discussed supportive measures. Consider PT. Small rx for tramadol given for when pain interferes with sleep. pmp reviwed  2. Renal lesion 3. Other specified disorders of kidney and ureter - MR Abdomen W Wo Contrast; Future  Other orders - Albuterol Sulfate (PROAIR RESPICLICK) 637 (90 Base) MCG/ACT AEPB; Inhale 2 puffs into the lungs 4 (four) times daily as needed. - traMADol (ULTRAM) 50 MG tablet; Take 1 tablet (50 mg total) by mouth every 8 (eight) hours as needed for up to 5 days.  No follow-ups on file.    Rutherford Guys, MD Primary Care at [facility] [place], [place] [address] Ph.  [phone] Fax [phone]

## 2020-04-05 MED ORDER — ALBUTEROL SULFATE HFA 108 (90 BASE) MCG/ACT IN AERS: 2.0000 | INHALATION_SPRAY | Freq: Four times a day (QID) | RESPIRATORY_TRACT | 3 refills | Status: DC | PRN

## 2020-04-05 NOTE — Telephone Encounter (Signed)
Pt needs PROAIR respiclick changed to plain proair or provental as her insurance no longer covers the respiclick

## 2020-04-13 ENCOUNTER — Other Ambulatory Visit: Payer: Self-pay | Admitting: Family Medicine

## 2020-04-13 DIAGNOSIS — M62838 Other muscle spasm: Secondary | ICD-10-CM

## 2020-04-13 MED FILL — FLUTICASONE PROP 50 MCG SPR: 50 | 60 days supply | Qty: 16 | Fill #1

## 2020-04-13 MED FILL — CYCLOBENZAPRINE HCL 10 MG T: 10 | 60 days supply | Qty: 180 | Fill #0

## 2020-04-13 MED FILL — SERTRALINE HCL 100 MG TAB: 100 | 90 days supply | Qty: 90 | Fill #0

## 2020-04-13 MED FILL — ONDANSETRON ODT 4 MG TABLET: 4 | 7 days supply | Qty: 20 | Fill #1

## 2020-04-13 NOTE — Telephone Encounter (Signed)
Requested medication (s) are due for refill today: yes  Requested medication (s) are on the active medication list: yes   Last refill:  01/06/2020  Future visit scheduled:no  Notes to clinic:  this refill cannot be delegated    Requested Prescriptions  Pending Prescriptions Disp Refills   cyclobenzaprine (FLEXERIL) 10 MG tablet [Pharmacy Med Name: CYCLOBENZAPRINE HCL 10 MG T 10 Tablet] 180 tablet 1    Sig: TAKE 1/2-1 TABLET BY MOUTH 3 TIMES DAILY AS NEEDED FOR MUSCLE SPASMS.      Not Delegated - Analgesics:  Muscle Relaxants Failed - 04/13/2020  7:24 AM      Failed - This refill cannot be delegated      Passed - Valid encounter within last 6 months    Recent Outpatient Visits           1 week ago Chronic right-sided thoracic back pain   [facility], Lilia Argue, MD   1 month ago Acute cystitis with hematuria   [facility], Lilia Argue, MD   4 months ago Right upper quadrant abdominal pain   [facility], Lilia Argue, MD   4 months ago Medicare annual wellness visit, subsequent   [facility], Lilia Argue, MD   7 months ago DOE (dyspnea on exertion)   [facility], Lilia Argue, MD               Signed Prescriptions Disp Refills   sertraline (ZOLOFT) 100 MG tablet 90 tablet 1    Sig: TAKE 1 TABLET (100 MG TOTAL) BY MOUTH DAILY.      Psychiatry:  Antidepressants - SSRI Passed - 04/13/2020  7:24 AM      Passed - Valid encounter within last 6 months    Recent Outpatient Visits           1 week ago Chronic right-sided thoracic back pain   [facility], Lilia Argue, MD   1 month ago Acute cystitis with hematuria   [facility], Lilia Argue, MD   4 months ago Right upper quadrant abdominal pain   [facility], Lilia Argue, MD   4 months ago Medicare annual wellness visit, subsequent   [facility], Lilia Argue, MD   7 months ago DOE (dyspnea on  exertion)   [facility], Lilia Argue, MD

## 2020-04-13 NOTE — Telephone Encounter (Signed)
Patient is requesting a refill of the following medications: Requested Prescriptions   Pending Prescriptions Disp Refills   cyclobenzaprine (FLEXERIL) 10 MG tablet [Pharmacy Med Name: CYCLOBENZAPRINE HCL 10 MG T 10 Tablet] 180 tablet 1    Sig: TAKE 1/2-1 TABLET BY MOUTH 3 TIMES DAILY AS NEEDED FOR MUSCLE SPASMS.   Signed Prescriptions Disp Refills   sertraline (ZOLOFT) 100 MG tablet 90 tablet 1    Sig: TAKE 1 TABLET (100 MG TOTAL) BY MOUTH DAILY.    Authorizing Provider: Rutherford Guys    Ordering User: HENDRICKS, CAYAREL L    Date of patient request: 04/13/2020 Last office visit: 04/03/2020 Date of last refill: 09/13/2019 Last refill amount: 180 tab

## 2020-04-26 ENCOUNTER — Other Ambulatory Visit: Payer: Self-pay | Admitting: Adult Health

## 2020-04-26 ENCOUNTER — Other Ambulatory Visit: Payer: Self-pay | Admitting: Family Medicine

## 2020-04-26 DIAGNOSIS — G43909 Migraine, unspecified, not intractable, without status migrainosus: Secondary | ICD-10-CM

## 2020-04-26 MED FILL — TOPIRAMATE 50 MG TABLET: 50 | 90 days supply | Qty: 180 | Fill #0

## 2020-04-26 MED FILL — DEXILANT DR 60 MG CAPSULE: 60 | 90 days supply | Qty: 90 | Fill #1

## 2020-04-26 MED FILL — PROPRANOLOL 60 MG TABLET: 60 | 90 days supply | Qty: 270 | Fill #0

## 2020-04-26 MED FILL — DULoxetine HCL 60 MG CPEP: 60 | 90 days supply | Qty: 90 | Fill #1

## 2020-04-26 MED FILL — ATORVASTATIN CALCIUM 10 MG: 10 | 90 days supply | Qty: 90 | Fill #1

## 2020-04-26 MED FILL — LISINOPRIL 20 MG TABLET: 20 | 90 days supply | Qty: 90 | Fill #1

## 2020-04-27 MED FILL — FLUARIX QUADRIVALENT 0.5 ML: 0.5 | 1 days supply | Qty: 1 | Fill #0

## 2020-04-28 ENCOUNTER — Ambulatory Visit
Admission: RE | Admit: 2020-04-28 | Discharge: 2020-04-28 | Disposition: A | Discharge status: Home / Self Care | Payer: 59 | Source: Ambulatory Visit | Attending: Family Medicine | Admitting: Family Medicine

## 2020-04-28 DIAGNOSIS — N281 Cyst of kidney, acquired: Secondary | ICD-10-CM | POA: Diagnosis not present

## 2020-04-28 DIAGNOSIS — K449 Diaphragmatic hernia without obstruction or gangrene: Secondary | ICD-10-CM | POA: Diagnosis not present

## 2020-04-28 DIAGNOSIS — N2889 Other specified disorders of kidney and ureter: Secondary | ICD-10-CM

## 2020-04-28 MED ORDER — GADOBENATE DIMEGLUMINE 529 MG/ML IV SOLN
20.0000 mL | Freq: Once | INTRAVENOUS | Status: AC | PRN
  Administered 2020-04-28: 20 mL via INTRAVENOUS

## 2020-05-01 ENCOUNTER — Telehealth (INDEPENDENT_AMBULATORY_CARE_PROVIDER_SITE_OTHER): Payer: 59 | Admitting: Family Medicine

## 2020-05-01 ENCOUNTER — Other Ambulatory Visit: Payer: Self-pay

## 2020-05-01 DIAGNOSIS — N281 Cyst of kidney, acquired: Secondary | ICD-10-CM | POA: Diagnosis not present

## 2020-05-01 DIAGNOSIS — M546 Pain in thoracic spine: Secondary | ICD-10-CM | POA: Diagnosis not present

## 2020-05-01 DIAGNOSIS — G8929 Other chronic pain: Secondary | ICD-10-CM | POA: Diagnosis not present

## 2020-05-01 NOTE — Progress Notes (Signed)
Virtual Visit Note  I connected with patient on 05/01/20 at 452pm by video epicand verified that I am speaking with the correct person using two identifiers. [name] is currently located at home and patient is currently with them during visit. The provider, Rutherford Guys, MD is located in their office at time of visit.  I discussed the limitations, risks, security and privacy concerns of performing an evaluation and management service by telephone and the availability of in person appointments. I also discussed with the patient that there may be a patient responsible charge related to this service. The patient expressed understanding and agreed to proceed.   I provided 9 minutes of non-face-to-face time during this encounter.  Chief Complaint  Patient presents with  . Results    results to the mri, no other medical concerns    HPI ? Patient reports her right sided flank pain is getting better She has continued with conservative measures for myofascial pain  MRI : IMPRESSION: Tiny sub-cm benign hemorrhagic cysts in both kidneys, which correspond to the lesion seen on previous CT. No evidence of renal neoplasm.  Large hiatal hernia. - (known)  Allergies  Allergen Reactions  . Sulfa Antibiotics Itching    Prior to Admission medications   Medication Sig Start Date End Date Taking? Authorizing Provider  albuterol (PROAIR HFA) 108 (90 Base) MCG/ACT inhaler Inhale 2 puffs into the lungs every 6 (six) hours as needed for wheezing or shortness of breath. 04/05/20  Yes Rutherford Guys, MD  atorvastatin (LIPITOR) 10 MG tablet TAKE 1 TABLET BY MOUTH ONCE DAILY AT 6PM 01/20/20  Yes Rutherford Guys, MD  cyclobenzaprine (FLEXERIL) 10 MG tablet TAKE 1/2-1 TABLET BY MOUTH 3 TIMES DAILY AS NEEDED FOR MUSCLE SPASMS. 04/13/20  Yes Rutherford Guys, MD  DEXILANT 60 MG capsule TAKE 1 CAPSULE BY MOUTH ONCE DAILY 01/20/20  Yes Rutherford Guys, MD  DULoxetine (CYMBALTA) 60 MG capsule TAKE  1 CAPSULE BY MOUTH ONCE DAILY 01/20/20  Yes Rutherford Guys, MD  fluticasone [facility]) 50 MCG/ACT nasal spray Place 1 spray into both nostrils daily. 12/13/19  Yes Rutherford Guys, MD  furosemide (LASIX) 20 MG tablet Take 1 tablet (20 mg total) by mouth 2 (two) times daily as needed. 09/13/19  Yes Rutherford Guys, MD  gabapentin (NEURONTIN) 300 MG capsule TAKE 1 TO 2 CAPSULES BY MOUTH 4 TIMES A DAY 01/20/20  Yes Rutherford Guys, MD  lisinopril (ZESTRIL) 20 MG tablet TAKE 1 TABLET BY MOUTH ONCE DAILY 01/20/20  Yes Rutherford Guys, MD  ondansetron (ZOFRAN-ODT) 4 MG disintegrating tablet DISSOLVE 1 TABLET BY MOUTH EVERY 8 HOURS AS NEEDED FOR NAUSEA OR VOMITING. 01/06/20  Yes Rutherford Guys, MD  propranolol (INDERAL) 60 MG tablet TAKE 1 TABLET (60 MG TOTAL) BY MOUTH 3 (THREE) TIMES DAILY. 04/26/20  Yes Lendon Colonel, NP  sertraline (ZOLOFT) 100 MG tablet TAKE 1 TABLET (100 MG TOTAL) BY MOUTH DAILY. 04/13/20  Yes Rutherford Guys, MD  topiramate (TOPAMAX) 50 MG tablet TAKE 1 TABLET BY MOUTH TWICE DAILY 04/26/20  Yes Wendie Agreste, MD    Past Medical History:  Diagnosis Date  . Allergy    [name], Flonase  . Anemia   . Anxiety   . Arthritis   . Chronic kidney disease   . Colon polyps   . Depression   . Fibromyalgia   . Gastritis   . GERD (gastroesophageal reflux disease)   . Hernia, hiatal   .  High risk for colon cancer   . HLD (hyperlipidemia)   . Hypertension   . Migraine   . Neuropathy   . Osteoporosis   . Pneumonia   . Sleep apnea    c-pap nightly  . Sleep apnea with use of continuous positive airway pressure (CPAP)   . Thyroid goiter     Past Surgical History:  Procedure Laterality Date  . 48-Hour Holter Monitor  06/2017   Mostly normal sinus rhythm.  Average heart rate 71 bpm.  Minimum 54 bpm.  Maximum sinus rate 114 bpm.  Rare PACs and PVCs.  No A. fib, atrial flutter, SVT or VT.  One brief run of 8 beat PAT.  Marland Kitchen ABDOMINAL HYSTERECTOMY     ovaries intact; DUB.  No  dysplasia.  Marland Kitchen JOINT REPLACEMENT    . OPEN REDUCTION INTERNAL FIXATION (ORIF) DISTAL RADIAL FRACTURE Right 07/09/2017   Procedure: RIGHT WRIST OPEN REDUCTION INTERNAL FIXATION (ORIF) DISTAL RADIAL FRACTURE AND REPAIR AS INDICATED;  Surgeon: Iran Planas, MD;  Location: [place];  Service: Orthopedics;  Laterality: Right;  . PARTIAL HYSTERECTOMY    . ROTATOR CUFF REPAIR Right 2009  . SHOULDER SURGERY Right    shoulder dislocation, fall, and elbow unla nerve  . SHOULDER SURGERY Right 2015   labral tear  . TRANSTHORACIC ECHOCARDIOGRAM  06/2017    EF 55-60%.  grade 1 diastolic dysfunction.  Otherwise normal  . TUBAL LIGATION    . ULNAR NERVE REPAIR Right 08/19/2014    Social History   Tobacco Use  . Smoking status: Former Smoker    Quit date: 08/04/1998    Years since quitting: 21.7  . Smokeless tobacco: Never Used  Substance Use Topics  . Alcohol use: No    Family History  Problem Relation Age of Onset  . Heart disease Mother 37       AMI/CAD/CHF as cause of death  . Hypertension Mother   . Hyperlipidemia Mother   . Hypothyroidism Mother   . Depression Mother   . Gout Mother   . Heart disease Father 75       AMI  . Hypertension Father   . Stroke Father 36       mild CVA  . Prostate cancer Father   . Hyperlipidemia Father   . Cancer Father 59       prostate cancer  . Hypertension Brother   . Hyperlipidemia Brother   . Cancer Brother        prostate cancer  . Cancer Maternal Grandmother        type unknown  . Cancer Maternal Grandfather        type unknown  . Heart disease Paternal Grandmother   . Heart defect Paternal Grandfather   . Melanoma Brother   . Hyperlipidemia Brother   . Hypertension Brother   . Cancer Brother        melanoma  . Hypertension Brother   . Hyperlipidemia Brother   . Melanoma Brother   . Cancer Brother 25       melanoma  . Cancer Sister        Basal cell carcinoma scalp  . Depression Sister   . Hypertension Brother   . Depression  Brother   . Hypertension Brother   . Gout Brother   . Arthritis Sister   . Depression Sister   . Colon cancer Neg Hx   . Esophageal cancer Neg Hx   . Stomach cancer Neg Hx   . Rectal cancer  Neg Hx     ROS Per hpi  Objective  Vitals as reported by the patient: none  GEN: AAOx3, NAD HEENT: [place]/AT, pupils are symmetrical, EOMI, non-icteric sclera Resp: breathing comfortably, speaking in full sentences Skin: no rashes noted, no pallor Psych: good eye contact, normal mood and affect   ASSESSMENT and PLAN  1. Chronic right-sided thoracic back pain 2. Bilateral renal cysts Improving, unclear if hemorrhagic cyst caused/contributed to pain. Patient declines urology referral at this time as she is getting better, she will reach out if changes her mind. RTC precautions reviewed  FOLLOW-UP: prn   The above assessment and management plan was discussed with the patient. The patient verbalized understanding of and has agreed to the management plan. Patient is aware to call the clinic if symptoms persist or worsen. Patient is aware when to return to the clinic for a follow-up visit. Patient educated on when it is appropriate to go to the emergency department.     Rutherford Guys, MD Primary Care at [facility] [place], [place] [address] Ph.  [phone] Fax [phone]

## 2020-05-01 NOTE — Patient Instructions (Signed)
° ° ° °  If you have lab work done today you will be contacted with your lab results within the next 2 weeks.  If you have not heard from us then please contact us. The fastest way to get your results is to register for My Chart. ° ° °IF you received an x-ray today, you will receive an invoice from [facility]. Please contact [facility] at [phone] with questions or concerns regarding your invoice.  ° °IF you received labwork today, you will receive an invoice from [facility]. Please contact [facility] at [phone] with questions or concerns regarding your invoice.  ° °Our billing staff will not be able to assist you with questions regarding bills from these companies. ° °You will be contacted with the lab results as soon as they are available. The fastest way to get your results is to activate your My Chart account. Instructions are located on the last page of this paperwork. If you have not heard from us regarding the results in 2 weeks, please contact this office. °  ° ° ° °

## 2020-05-21 ENCOUNTER — Other Ambulatory Visit: Payer: Self-pay

## 2020-05-21 ENCOUNTER — Other Ambulatory Visit (HOSPITAL_BASED_OUTPATIENT_CLINIC_OR_DEPARTMENT_OTHER): Payer: Self-pay | Admitting: Internal Medicine

## 2020-05-21 ENCOUNTER — Ambulatory Visit: Payer: 59 | Attending: Internal Medicine

## 2020-05-21 DIAGNOSIS — Z23 Encounter for immunization: Secondary | ICD-10-CM

## 2020-05-21 NOTE — Progress Notes (Signed)
   Covid-19 Vaccination Clinic  Name:  [name]    MRN: [id number] DOB: Nov 02, 1955  05/21/2020  [name] was observed post Covid-19 immunization for 15 minutes without incident. She was provided with Vaccine Information Sheet and instruction to access the V-Safe system. Vaccinated by Harriet Pho.  [name] was instructed to call 911 with any severe reactions post vaccine: Marland Kitchen Difficulty breathing  . Swelling of face and throat  . A fast heartbeat  . A bad rash all over body  . Dizziness and weakness

## 2020-05-29 MED FILL — PFIZER-BIONTECH COVID-19 VA: 30 | 1 days supply | Qty: 0 | Fill #0

## 2020-07-04 ENCOUNTER — Other Ambulatory Visit: Payer: Self-pay

## 2020-07-04 ENCOUNTER — Ambulatory Visit: Payer: 59 | Admitting: Emergency Medicine

## 2020-07-04 ENCOUNTER — Other Ambulatory Visit: Payer: Self-pay | Admitting: Emergency Medicine

## 2020-07-04 ENCOUNTER — Encounter: Payer: Self-pay | Admitting: Emergency Medicine

## 2020-07-04 VITALS — BP 123/77 | HR 71 | Temp 98.1°F | Ht 64.0 in | Wt 257.0 lb

## 2020-07-04 DIAGNOSIS — Z7689 Persons encountering health services in other specified circumstances: Secondary | ICD-10-CM | POA: Diagnosis not present

## 2020-07-04 DIAGNOSIS — R110 Nausea: Secondary | ICD-10-CM

## 2020-07-04 DIAGNOSIS — N281 Cyst of kidney, acquired: Secondary | ICD-10-CM

## 2020-07-04 DIAGNOSIS — F32A Depression, unspecified: Secondary | ICD-10-CM | POA: Insufficient documentation

## 2020-07-04 DIAGNOSIS — M797 Fibromyalgia: Secondary | ICD-10-CM

## 2020-07-04 DIAGNOSIS — E785 Hyperlipidemia, unspecified: Secondary | ICD-10-CM | POA: Diagnosis not present

## 2020-07-04 DIAGNOSIS — M792 Neuralgia and neuritis, unspecified: Secondary | ICD-10-CM | POA: Diagnosis not present

## 2020-07-04 DIAGNOSIS — I10 Essential (primary) hypertension: Secondary | ICD-10-CM

## 2020-07-04 DIAGNOSIS — K219 Gastro-esophageal reflux disease without esophagitis: Secondary | ICD-10-CM

## 2020-07-04 DIAGNOSIS — I2721 Secondary pulmonary arterial hypertension: Secondary | ICD-10-CM

## 2020-07-04 MED ORDER — DEXILANT 60 MG PO CPDR: 1.0000 | DELAYED_RELEASE_CAPSULE | Freq: Every day | ORAL | 1 refills | Status: DC

## 2020-07-04 MED ORDER — ONDANSETRON 4 MG PO TBDP: 4.0000 mg | ORAL_TABLET | Freq: Three times a day (TID) | ORAL | 4 refills | Status: DC | PRN

## 2020-07-04 MED ORDER — DULOXETINE HCL 60 MG PO CPEP: 60.0000 mg | ORAL_CAPSULE | Freq: Every day | ORAL | 1 refills | Status: DC

## 2020-07-04 MED ORDER — GABAPENTIN 300 MG PO CAPS: ORAL_CAPSULE | ORAL | 1 refills | Status: DC

## 2020-07-04 MED ORDER — SERTRALINE HCL 100 MG PO TABS
100.0000 mg | ORAL_TABLET | Freq: Every day | ORAL | 1 refills | Status: DC
Start: 2020-07-04 — End: 2020-07-04

## 2020-07-04 MED ORDER — ATORVASTATIN CALCIUM 10 MG PO TABS: 10.0000 mg | ORAL_TABLET | Freq: Every day | ORAL | 1 refills | Status: DC

## 2020-07-04 MED ORDER — LISINOPRIL 20 MG PO TABS: 20.0000 mg | ORAL_TABLET | Freq: Every day | ORAL | 1 refills | Status: DC

## 2020-07-04 MED FILL — SERTRALINE HCL 100 MG TAB: 100 | 90 days supply | Qty: 90 | Fill #0

## 2020-07-04 MED FILL — GABAPENTIN 300 MG CAPSULE: 300 | 68 days supply | Qty: 540 | Fill #0

## 2020-07-04 MED FILL — ONDANSETRON ODT 4 MG TABLET: 4 | 7 days supply | Qty: 20 | Fill #0

## 2020-07-04 NOTE — Patient Instructions (Addendum)
   If you have lab work done today you will be contacted with your lab results within the next 2 weeks.  If you have not heard from us then please contact us. The fastest way to get your results is to register for My Chart.   IF you received an x-ray today, you will receive an invoice from [facility]. Please contact [facility] at [phone] with questions or concerns regarding your invoice.   IF you received labwork today, you will receive an invoice from [facility]. Please contact [facility] at [phone] with questions or concerns regarding your invoice.   Our billing staff will not be able to assist you with questions regarding bills from these companies.  You will be contacted with the lab results as soon as they are available. The fastest way to get your results is to activate your My Chart account. Instructions are located on the last page of this paperwork. If you have not heard from us regarding the results in 2 weeks, please contact this office.       Health Maintenance, Female Adopting a healthy lifestyle and getting preventive care are important in promoting health and wellness. Ask your health care provider about:  The right schedule for you to have regular tests and exams.  Things you can do on your own to prevent diseases and keep yourself healthy. What should I know about diet, weight, and exercise? Eat a healthy diet   Eat a diet that includes plenty of vegetables, fruits, low-fat dairy products, and lean protein.  Do not eat a lot of foods that are high in solid fats, added sugars, or sodium. Maintain a healthy weight Body mass index (BMI) is used to identify weight problems. It estimates body fat based on height and weight. Your health care provider can help determine your BMI and help you achieve or maintain a healthy weight. Get regular exercise Get regular exercise. This is one of the most important things you can do for your health. Most  adults should:  Exercise for at least 150 minutes each week. The exercise should increase your heart rate and make you sweat (moderate-intensity exercise).  Do strengthening exercises at least twice a week. This is in addition to the moderate-intensity exercise.  Spend less time sitting. Even light physical activity can be beneficial. Watch cholesterol and blood lipids Have your blood tested for lipids and cholesterol at 64 years of age, then have this test every 5 years. Have your cholesterol levels checked more often if:  Your lipid or cholesterol levels are high.  You are older than 64 years of age.  You are at high risk for heart disease. What should I know about cancer screening? Depending on your health history and family history, you may need to have cancer screening at various ages. This may include screening for:  Breast cancer.  Cervical cancer.  Colorectal cancer.  Skin cancer.  Lung cancer. What should I know about heart disease, diabetes, and high blood pressure? Blood pressure and heart disease  High blood pressure causes heart disease and increases the risk of stroke. This is more likely to develop in people who have high blood pressure readings, are of African descent, or are overweight.  Have your blood pressure checked: ? Every 3-5 years if you are 18-39 years of age. ? Every year if you are 40 years old or older. Diabetes Have regular diabetes screenings. This checks your fasting blood sugar level. Have the screening done:  Once   every three years after age 40 if you are at a normal weight and have a low risk for diabetes.  More often and at a younger age if you are overweight or have a high risk for diabetes. What should I know about preventing infection? Hepatitis B If you have a higher risk for hepatitis B, you should be screened for this virus. Talk with your health care provider to find out if you are at risk for hepatitis B infection. Hepatitis  C Testing is recommended for:  Everyone born from 1945 through 1965.  Anyone with known risk factors for hepatitis C. Sexually transmitted infections (STIs)  Get screened for STIs, including gonorrhea and chlamydia, if: ? You are sexually active and are younger than 64 years of age. ? You are older than 64 years of age and your health care provider tells you that you are at risk for this type of infection. ? Your sexual activity has changed since you were last screened, and you are at increased risk for chlamydia or gonorrhea. Ask your health care provider if you are at risk.  Ask your health care provider about whether you are at high risk for HIV. Your health care provider may recommend a prescription medicine to help prevent HIV infection. If you choose to take medicine to prevent HIV, you should first get tested for HIV. You should then be tested every 3 months for as long as you are taking the medicine. Pregnancy  If you are about to stop having your period (premenopausal) and you may become pregnant, seek counseling before you get pregnant.  Take 400 to 800 micrograms (mcg) of folic acid every day if you become pregnant.  Ask for birth control (contraception) if you want to prevent pregnancy. Osteoporosis and menopause Osteoporosis is a disease in which the bones lose minerals and strength with aging. This can result in bone fractures. If you are 65 years old or older, or if you are at risk for osteoporosis and fractures, ask your health care provider if you should:  Be screened for bone loss.  Take a calcium or vitamin D supplement to lower your risk of fractures.  Be given hormone replacement therapy (HRT) to treat symptoms of menopause. Follow these instructions at home: Lifestyle  Do not use any products that contain nicotine or tobacco, such as cigarettes, e-cigarettes, and chewing tobacco. If you need help quitting, ask your health care provider.  Do not use street  drugs.  Do not share needles.  Ask your health care provider for help if you need support or information about quitting drugs. Alcohol use  Do not drink alcohol if: ? Your health care provider tells you not to drink. ? You are pregnant, may be pregnant, or are planning to become pregnant.  If you drink alcohol: ? Limit how much you use to 0-1 drink a day. ? Limit intake if you are breastfeeding.  Be aware of how much alcohol is in your drink. In the [place] one drink equals one 12 oz bottle of beer (355 mL), one 5 oz glass of wine (148 mL), or one 1 oz glass of hard liquor (44 mL). General instructions  Schedule regular health, dental, and eye exams.  Stay current with your vaccines.  Tell your health care provider if: ? You often feel depressed. ? You have ever been abused or do not feel safe at home. Summary  Adopting a healthy lifestyle and getting preventive care are important in promoting health and   wellness.  Follow your health care provider's instructions about healthy diet, exercising, and getting tested or screened for diseases.  Follow your health care provider's instructions on monitoring your cholesterol and blood pressure. This information is not intended to replace advice given to you by your health care provider. Make sure you discuss any questions you have with your health care provider. Document Revised: 07/14/2018 Document Reviewed: 07/14/2018 [employer] Patient Education  2020 [employer]  

## 2020-07-04 NOTE — Progress Notes (Signed)
[name] 64 y.o.   Chief Complaint  Patient presents with  . Abdominal Pain    2 weeks   . Transitions Of Care  . maybe low iorn    feeling weak and wobbly     HISTORY OF PRESENT ILLNESS: This is a 64 y.o. female here to establish care with me.  Used to see Dr. Pamella Pert. Has multiple chronic medical problems including the following: 1.  Chronic pain syndrome secondary to fibromyalgia.  Takes gabapentin several times a day with good results. 2.  Chronic abdominal pain.  Recent upper and lower endoscopies results reviewed with patient.  Has polyps and hiatal hernia. 3.  Moderate hiatal hernia with occasional reflux symptoms. 4.  Dyspnea on exertion with history of mild diastolic heart failure.  Also has history of pulmonary arterial hypertension. 5.  Chronic depression and anxiety 6.  Essential hypertension 7.  Obstructive sleep apnea on CPAP therapy 8.  Iron deficiency anemia  HPI   Prior to Admission medications   Medication Sig Start Date End Date Taking? Authorizing Provider  albuterol (PROAIR HFA) 108 (90 Base) MCG/ACT inhaler Inhale 2 puffs into the lungs every 6 (six) hours as needed for wheezing or shortness of breath. 04/05/20  Yes Rutherford Guys, MD  atorvastatin (LIPITOR) 10 MG tablet Take 1 tablet (10 mg total) by mouth daily. 07/04/20  Yes Cornie Mccomber, Ines Bloomer, MD  cyclobenzaprine (FLEXERIL) 10 MG tablet TAKE 1/2-1 TABLET BY MOUTH 3 TIMES DAILY AS NEEDED FOR MUSCLE SPASMS. 04/13/20  Yes Rutherford Guys, MD  dexlansoprazole (DEXILANT) 60 MG capsule Take 1 capsule (60 mg total) by mouth daily. 07/04/20  Yes Elly Haffey, Ines Bloomer, MD  DULoxetine (CYMBALTA) 60 MG capsule Take 1 capsule (60 mg total) by mouth daily. 07/04/20  Yes Tamarion Haymond, Ines Bloomer, MD  fluticasone [facility]) 50 MCG/ACT nasal spray Place 1 spray into both nostrils daily. 12/13/19  Yes Rutherford Guys, MD  furosemide (LASIX) 20 MG tablet Take 1 tablet (20 mg total) by mouth 2 (two) times daily as  needed. 09/13/19  Yes Rutherford Guys, MD  gabapentin (NEURONTIN) 300 MG capsule TAKE 1 TO 2 CAPSULES BY MOUTH 4 TIMES A DAY 07/04/20  Yes Krystal Teachey, Ines Bloomer, MD  lisinopril (ZESTRIL) 20 MG tablet Take 1 tablet (20 mg total) by mouth daily. 07/04/20  Yes Alyce Inscore, Ines Bloomer, MD  ondansetron (ZOFRAN-ODT) 4 MG disintegrating tablet Take 1 tablet (4 mg total) by mouth every 8 (eight) hours as needed for nausea or vomiting. 07/04/20  Yes Farrell Pantaleo, Ines Bloomer, MD  propranolol (INDERAL) 60 MG tablet TAKE 1 TABLET (60 MG TOTAL) BY MOUTH 3 (THREE) TIMES DAILY. 04/26/20  Yes Lendon Colonel, NP  sertraline (ZOLOFT) 100 MG tablet Take 1 tablet (100 mg total) by mouth daily. 07/04/20  Yes Horald Pollen, MD  topiramate (TOPAMAX) 50 MG tablet TAKE 1 TABLET BY MOUTH TWICE DAILY 04/26/20  Yes Wendie Agreste, MD    Allergies  Allergen Reactions  . Sulfa Antibiotics Itching    Patient Active Problem List   Diagnosis Date Noted  . Iron deficiency anemia 10/13/2018  . DOE (dyspnea on exertion) 03/23/2018  . Obesity, morbid, BMI 40.0-49.9 ([place]) 03/23/2018  . Chronic diastolic heart failure ([place]) 03/23/2018  . Grade I diastolic dysfunction 24/26/8341  . Migraine without aura and without status migrainosus, not intractable 03/02/2017  . Class 3 severe obesity due to excess calories with serious comorbidity and body mass index (BMI) of 40.0 to 44.9 in adult (  [place]) 01/13/2017  . Hiatal hernia 10/28/2016  . Pure hypercholesterolemia 10/28/2016  . Chronic kidney disease 06/01/2015  . Esophageal reflux 06/01/2015  . Goiter 06/01/2015  . Essential hypertension 06/01/2015  . Chronic pain syndrome 05/28/2015  . Fibromyalgia 05/28/2015  . OSA (obstructive sleep apnea) 05/28/2015  . Adjustment disorder with mixed anxiety and depressed mood 05/28/2015    Past Medical History:  Diagnosis Date  . Allergy    [name], Flonase  . Anemia   . Anxiety   . Arthritis   . Chronic kidney disease   . Colon  polyps   . Depression   . Fibromyalgia   . Gastritis   . GERD (gastroesophageal reflux disease)   . Hernia, hiatal   . High risk for colon cancer   . HLD (hyperlipidemia)   . Hypertension   . Migraine   . Neuropathy   . Osteoporosis   . Pneumonia   . Sleep apnea    c-pap nightly  . Sleep apnea with use of continuous positive airway pressure (CPAP)   . Thyroid goiter     Past Surgical History:  Procedure Laterality Date  . 48-Hour Holter Monitor  06/2017   Mostly normal sinus rhythm.  Average heart rate 71 bpm.  Minimum 54 bpm.  Maximum sinus rate 114 bpm.  Rare PACs and PVCs.  No A. fib, atrial flutter, SVT or VT.  One brief run of 8 beat PAT.  Marland Kitchen ABDOMINAL HYSTERECTOMY     ovaries intact; DUB.  No dysplasia.  Marland Kitchen JOINT REPLACEMENT    . OPEN REDUCTION INTERNAL FIXATION (ORIF) DISTAL RADIAL FRACTURE Right 07/09/2017   Procedure: RIGHT WRIST OPEN REDUCTION INTERNAL FIXATION (ORIF) DISTAL RADIAL FRACTURE AND REPAIR AS INDICATED;  Surgeon: Iran Planas, MD;  Location: [place];  Service: Orthopedics;  Laterality: Right;  . PARTIAL HYSTERECTOMY    . ROTATOR CUFF REPAIR Right 2009  . SHOULDER SURGERY Right    shoulder dislocation, fall, and elbow unla nerve  . SHOULDER SURGERY Right 2015   labral tear  . TRANSTHORACIC ECHOCARDIOGRAM  06/2017    EF 55-60%.  grade 1 diastolic dysfunction.  Otherwise normal  . TUBAL LIGATION    . ULNAR NERVE REPAIR Right 08/19/2014    Social History   Socioeconomic History  . Marital status: Married    Spouse name: Not on file  . Number of children: 3  . Years of education: Not on file  . Highest education level: Not on file  Occupational History  . Occupation: unemployed  Tobacco Use  . Smoking status: Former Smoker    Quit date: 08/04/1998    Years since quitting: 21.9  . Smokeless tobacco: Never Used  Vaping Use  . Vaping Use: Never used  Substance and Sexual Activity  . Alcohol use: No  . Drug use: No  . Sexual activity: Yes    Birth  control/protection: Surgical, Post-menopausal  Other Topics Concern  . Not on file  Social History Narrative   Marital status: married x 20 years; moderately happy     Children: 3 children (47 [name], 2 Dianna, 40 April); 8 grandchildren; 0 gg     Lives: with husband/Steve, April, 2 granddaughters     Employment:  Unemployed; quit working 2015 with work related injury; awaiting disability in 2018; disability approved in 02/2017.       Tobacco: quit smoking in 2016; smoked x 2 years.      Alcohol: none      Drugs: none  Exercise: rarely in 2018         Social Determinants of Health   Financial Resource Strain:   . Difficulty of Paying Living Expenses: Not on file  Food Insecurity:   . Worried About Charity fundraiser in the Last Year: Not on file  . Ran Out of Food in the Last Year: Not on file  Transportation Needs:   . Lack of Transportation (Medical): Not on file  . Lack of Transportation (Non-Medical): Not on file  Physical Activity:   . Days of Exercise per Week: Not on file  . Minutes of Exercise per Session: Not on file  Stress:   . Feeling of Stress : Not on file  Social Connections:   . Frequency of Communication with Friends and Family: Not on file  . Frequency of Social Gatherings with Friends and Family: Not on file  . Attends Religious Services: Not on file  . Active Member of Clubs or Organizations: Not on file  . Attends Archivist Meetings: Not on file  . Marital Status: Not on file  Intimate Partner Violence:   . Fear of Current or Ex-Partner: Not on file  . Emotionally Abused: Not on file  . Physically Abused: Not on file  . Sexually Abused: Not on file    Family History  Problem Relation Age of Onset  . Heart disease Mother 63       AMI/CAD/CHF as cause of death  . Hypertension Mother   . Hyperlipidemia Mother   . Hypothyroidism Mother   . Depression Mother   . Gout Mother   . Heart disease Father 68       AMI  . Hypertension  Father   . Stroke Father 42       mild CVA  . Prostate cancer Father   . Hyperlipidemia Father   . Cancer Father 54       prostate cancer  . Hypertension Brother   . Hyperlipidemia Brother   . Cancer Brother        prostate cancer  . Cancer Maternal Grandmother        type unknown  . Cancer Maternal Grandfather        type unknown  . Heart disease Paternal Grandmother   . Heart defect Paternal Grandfather   . Melanoma Brother   . Hyperlipidemia Brother   . Hypertension Brother   . Cancer Brother        melanoma  . Hypertension Brother   . Hyperlipidemia Brother   . Melanoma Brother   . Cancer Brother 25       melanoma  . Cancer Sister        Basal cell carcinoma scalp  . Depression Sister   . Hypertension Brother   . Depression Brother   . Hypertension Brother   . Gout Brother   . Arthritis Sister   . Depression Sister   . Colon cancer Neg Hx   . Esophageal cancer Neg Hx   . Stomach cancer Neg Hx   . Rectal cancer Neg Hx      Review of Systems  Constitutional: Negative.  Negative for chills and fever.  HENT: Negative.  Negative for congestion and sore throat.   Respiratory: Negative.  Negative for cough and shortness of breath.   Cardiovascular: Negative.  Negative for chest pain and palpitations.  Gastrointestinal: Negative.  Negative for abdominal pain, diarrhea, nausea and vomiting.  Genitourinary: Negative.  Negative for dysuria and hematuria.  Musculoskeletal: Positive for back pain, joint pain and myalgias.  Skin: Negative.  Negative for rash.  Neurological: Negative.  Negative for dizziness and headaches.  Endo/Heme/Allergies: Negative.   All other systems reviewed and are negative.  Vitals:   07/04/20 0958  BP: 123/77  Pulse: 71  Temp: 98.1 F (36.7 C)  SpO2: 95%   Wt Readings from Last 3 Encounters:  07/04/20 257 lb (116.6 kg)  04/03/20 253 lb (114.8 kg)  03/13/20 260 lb 9.6 oz (118.2 kg)     Physical Exam Vitals reviewed.    Constitutional:      Appearance: She is well-developed. She is obese.  HENT:     Head: Normocephalic.  Eyes:     Extraocular Movements: Extraocular movements intact.     Conjunctiva/sclera: Conjunctivae normal.     Pupils: Pupils are equal, round, and reactive to light.  Cardiovascular:     Rate and Rhythm: Normal rate and regular rhythm.     Pulses: Normal pulses.     Heart sounds: Normal heart sounds.  Pulmonary:     Effort: Pulmonary effort is normal.     Breath sounds: Normal breath sounds.  Musculoskeletal:        General: Normal range of motion.     Cervical back: Normal range of motion and neck supple.  Skin:    General: Skin is warm and dry.     Capillary Refill: Capillary refill takes less than 2 seconds.  Neurological:     General: No focal deficit present.     Mental Status: She is alert and oriented to person, place, and time.  Psychiatric:        Mood and Affect: Mood normal.        Behavior: Behavior normal.    A total of 30 minutes was spent with the patient, greater than 50% of which was in counseling/coordination of care regarding establishing care with me, review of multiple chronic medical problems, review of all medications, review of most recent upper and lower endoscopies, review of most recent echocardiogram, review of most recent CT of the chest, review of most recent office visit notes, review of most recent blood work results, education on nutrition, health maintenance items, need for referral to urology for evaluation of renal cysts, prognosis documentation and need for follow-up.   ASSESSMENT & PLAN: Kealey was seen today for abdominal pain, transitions of care and maybe low iorn.  Diagnoses and all orders for this visit:  Encounter to establish care with new doctor  Nausea without vomiting -     ondansetron (ZOFRAN-ODT) 4 MG disintegrating tablet; Take 1 tablet (4 mg total) by mouth every 8 (eight) hours as needed for nausea or  vomiting.  Fibromyalgia -     gabapentin (NEURONTIN) 300 MG capsule; TAKE 1 TO 2 CAPSULES BY MOUTH 4 TIMES A DAY -     DULoxetine (CYMBALTA) 60 MG capsule; Take 1 capsule (60 mg total) by mouth daily.  Nerve pain -     gabapentin (NEURONTIN) 300 MG capsule; TAKE 1 TO 2 CAPSULES BY MOUTH 4 TIMES A DAY  Essential hypertension -     lisinopril (ZESTRIL) 20 MG tablet; Take 1 tablet (20 mg total) by mouth daily.  Hyperlipidemia, unspecified hyperlipidemia type -     atorvastatin (LIPITOR) 10 MG tablet; Take 1 tablet (10 mg total) by mouth daily.  Depression, unspecified depression type -     DULoxetine (CYMBALTA) 60 MG capsule; Take 1 capsule (60 mg total) by mouth  daily.  Gastroesophageal reflux disease -     dexlansoprazole (DEXILANT) 60 MG capsule; Take 1 capsule (60 mg total) by mouth daily.  Pulmonary arterial hypertension ([place])  Kidney cysts -     Ambulatory referral to Urology  Other orders -     sertraline (ZOLOFT) 100 MG tablet; Take 1 tablet (100 mg total) by mouth daily.    Patient Instructions       If you have lab work done today you will be contacted with your lab results within the next 2 weeks.  If you have not heard from [place] then please contact us. The fastest way to get your results is to register for My Chart.   IF you received an x-ray today, you will receive an invoice from [facility]. Please contact [facility] at [phone] with questions or concerns regarding your invoice.   IF you received labwork today, you will receive an invoice from [facility]. Please contact [facility] at [phone] with questions or concerns regarding your invoice.   Our billing staff will not be able to assist you with questions regarding bills from these companies.  You will be contacted with the lab results as soon as they are available. The fastest way to get your results is to activate your My Chart account. Instructions are located on the last page of  this paperwork. If you have not heard from [place] regarding the results in 2 weeks, please contact this office.      Health Maintenance, Female Adopting a healthy lifestyle and getting preventive care are important in promoting health and wellness. Ask your health care provider about:  The right schedule for you to have regular tests and exams.  Things you can do on your own to prevent diseases and keep yourself healthy. What should I know about diet, weight, and exercise? Eat a healthy diet   Eat a diet that includes plenty of vegetables, fruits, low-fat dairy products, and lean protein.  Do not eat a lot of foods that are high in solid fats, added sugars, or sodium. Maintain a healthy weight Body mass index (BMI) is used to identify weight problems. It estimates body fat based on height and weight. Your health care provider can help determine your BMI and help you achieve or maintain a healthy weight. Get regular exercise Get regular exercise. This is one of the most important things you can do for your health. Most adults should:  Exercise for at least 150 minutes each week. The exercise should increase your heart rate and make you sweat (moderate-intensity exercise).  Do strengthening exercises at least twice a week. This is in addition to the moderate-intensity exercise.  Spend less time sitting. Even light physical activity can be beneficial. Watch cholesterol and blood lipids Have your blood tested for lipids and cholesterol at 64 years of age, then have this test every 5 years. Have your cholesterol levels checked more often if:  Your lipid or cholesterol levels are high.  You are older than 64 years of age.  You are at high risk for heart disease. What should I know about cancer screening? Depending on your health history and family history, you may need to have cancer screening at various ages. This may include screening for:  Breast cancer.  Cervical  cancer.  Colorectal cancer.  Skin cancer.  Lung cancer. What should I know about heart disease, diabetes, and high blood pressure? Blood pressure and heart disease  High blood pressure causes heart disease and increases  the risk of stroke. This is more likely to develop in people who have high blood pressure readings, are of African descent, or are overweight.  Have your blood pressure checked: ? Every 3-5 years if you are 34-31 years of age. ? Every year if you are 63 years old or older. Diabetes Have regular diabetes screenings. This checks your fasting blood sugar level. Have the screening done:  Once every three years after age 71 if you are at a normal weight and have a low risk for diabetes.  More often and at a younger age if you are overweight or have a high risk for diabetes. What should I know about preventing infection? Hepatitis B If you have a higher risk for hepatitis B, you should be screened for this virus. Talk with your health care provider to find out if you are at risk for hepatitis B infection. Hepatitis C Testing is recommended for:  Everyone born from [birth year] through 1965.  Anyone with known risk factors for hepatitis C. Sexually transmitted infections (STIs)  Get screened for STIs, including gonorrhea and chlamydia, if: ? You are sexually active and are younger than 64 years of age. ? You are older than 64 years of age and your health care provider tells you that you are at risk for this type of infection. ? Your sexual activity has changed since you were last screened, and you are at increased risk for chlamydia or gonorrhea. Ask your health care provider if you are at risk.  Ask your health care provider about whether you are at high risk for HIV. Your health care provider may recommend a prescription medicine to help prevent HIV infection. If you choose to take medicine to prevent HIV, you should first get tested for HIV. You should then be tested every 3  months for as long as you are taking the medicine. Pregnancy  If you are about to stop having your period (premenopausal) and you may become pregnant, seek counseling before you get pregnant.  Take 400 to 800 micrograms (mcg) of folic acid every day if you become pregnant.  Ask for birth control (contraception) if you want to prevent pregnancy. Osteoporosis and menopause Osteoporosis is a disease in which the bones lose minerals and strength with aging. This can result in bone fractures. If you are 73 years old or older, or if you are at risk for osteoporosis and fractures, ask your health care provider if you should:  Be screened for bone loss.  Take a calcium or vitamin D supplement to lower your risk of fractures.  Be given hormone replacement therapy (HRT) to treat symptoms of menopause. Follow these instructions at home: Lifestyle  Do not use any products that contain nicotine or tobacco, such as cigarettes, e-cigarettes, and chewing tobacco. If you need help quitting, ask your health care provider.  Do not use street drugs.  Do not share needles.  Ask your health care provider for help if you need support or information about quitting drugs. Alcohol use  Do not drink alcohol if: ? Your health care provider tells you not to drink. ? You are pregnant, may be pregnant, or are planning to become pregnant.  If you drink alcohol: ? Limit how much you use to 0-1 drink a day. ? Limit intake if you are breastfeeding.  Be aware of how much alcohol is in your drink. In the [place] one drink equals one 12 oz bottle of beer (355 mL), one 5 oz glass of  wine (148 mL), or one 1 oz glass of hard liquor (44 mL). General instructions  Schedule regular health, dental, and eye exams.  Stay current with your vaccines.  Tell your health care provider if: ? You often feel depressed. ? You have ever been abused or do not feel safe at home. Summary  Adopting a healthy lifestyle and getting  preventive care are important in promoting health and wellness.  Follow your health care provider's instructions about healthy diet, exercising, and getting tested or screened for diseases.  Follow your health care provider's instructions on monitoring your cholesterol and blood pressure. This information is not intended to replace advice given to you by your health care provider. Make sure you discuss any questions you have with your health care provider. Document Revised: 07/14/2018 Document Reviewed: 07/14/2018 Elsevier Patient Education  2020 Elsevier Inc.      Agustina Caroli, MD [facility]

## 2020-08-02 MED FILL — LISINOPRIL 20 MG TABLET: 20 | 90 days supply | Qty: 90 | Fill #0

## 2020-08-02 MED FILL — DEXILANT DR 60 MG CAPSULE: 60 | 90 days supply | Qty: 90 | Fill #0

## 2020-08-02 MED FILL — CYCLOBENZAPRINE HCL 10 MG T: 10 | 60 days supply | Qty: 180 | Fill #1

## 2020-08-02 MED FILL — DULoxetine HCL 60 MG CPEP: 60 | 90 days supply | Qty: 90 | Fill #0

## 2020-08-02 MED FILL — TOPIRAMATE 50 MG TABLET: 50 | 90 days supply | Qty: 180 | Fill #1

## 2020-08-02 MED FILL — ATORVASTATIN CALCIUM 10 MG: 10 | 90 days supply | Qty: 90 | Fill #0

## 2020-08-02 MED FILL — PROPRANOLOL 60 MG TABLET: 60 | 90 days supply | Qty: 270 | Fill #1

## 2020-09-01 IMAGING — DX DG CHEST 2V
2 series · 2 of 2 positions shown · non-contrast
Comparison: Chest CTA dated 02/22/2018 and chest radiographs dated
12/10/2017.

CLINICAL DATA: Cough, fatigue.  Ex-smoker.

EXAM:
CHEST - 2 VIEW

[chest pa]
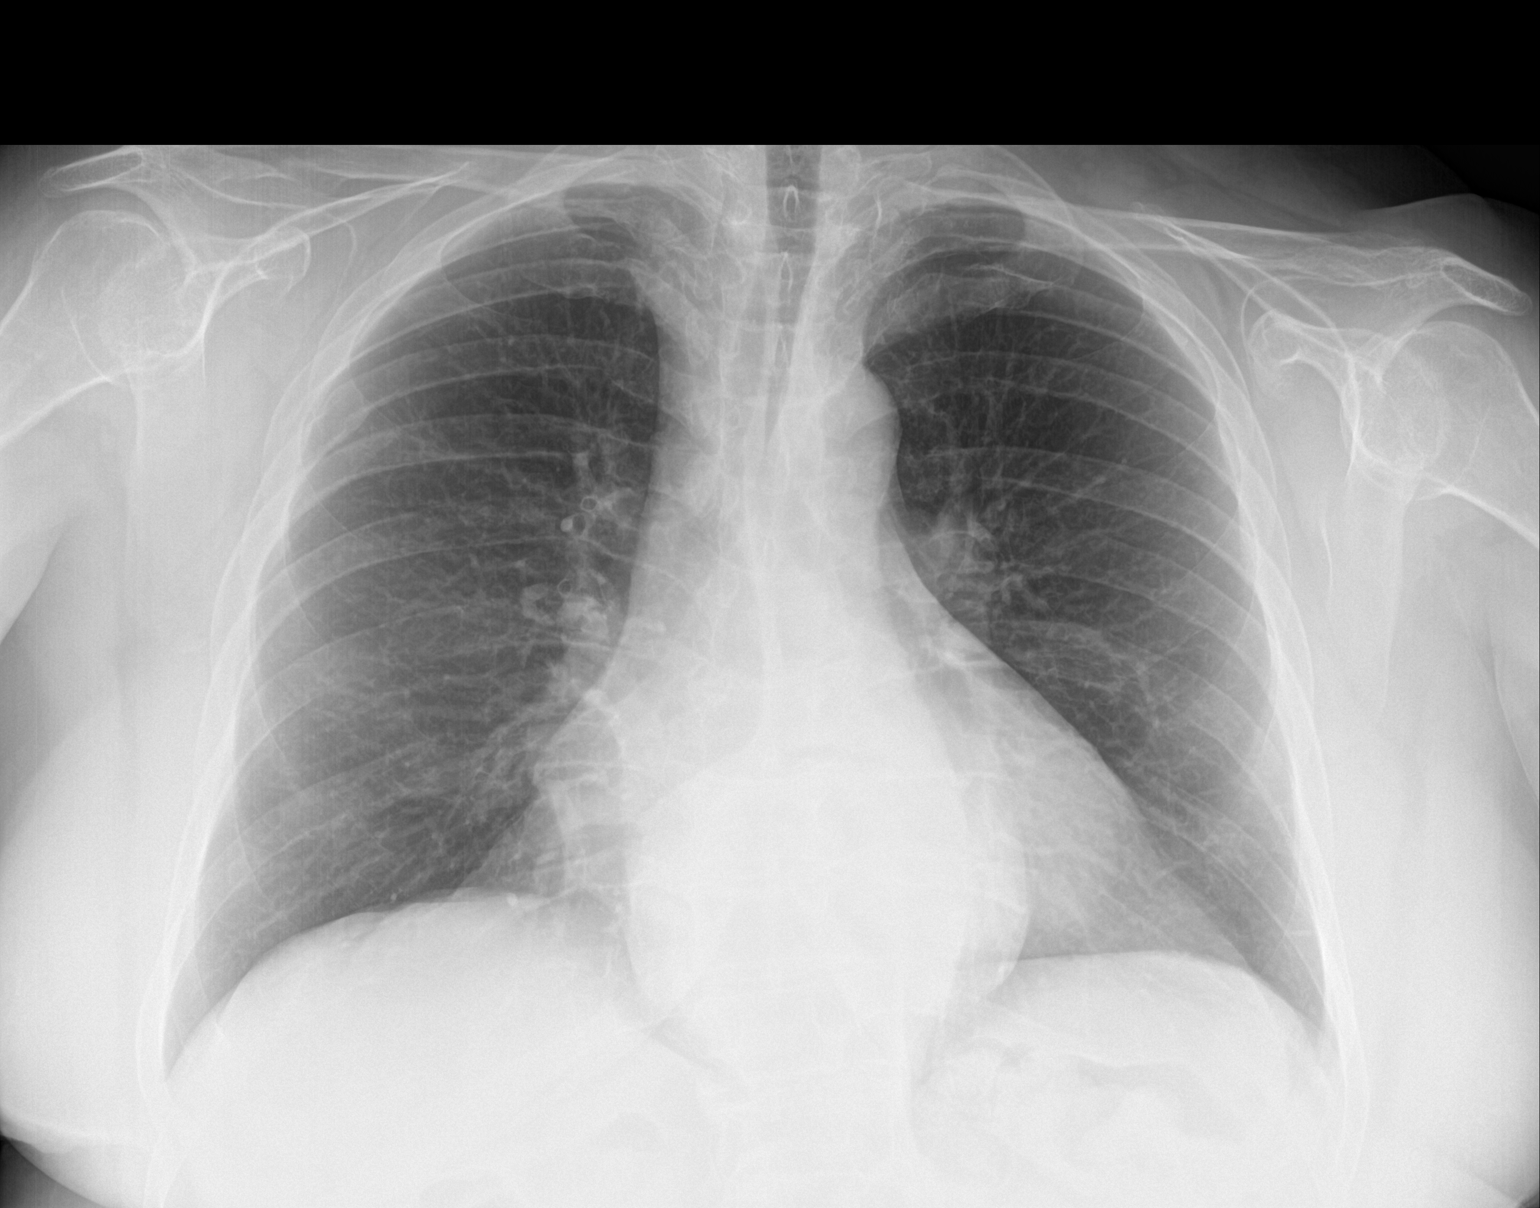

[chest lat]
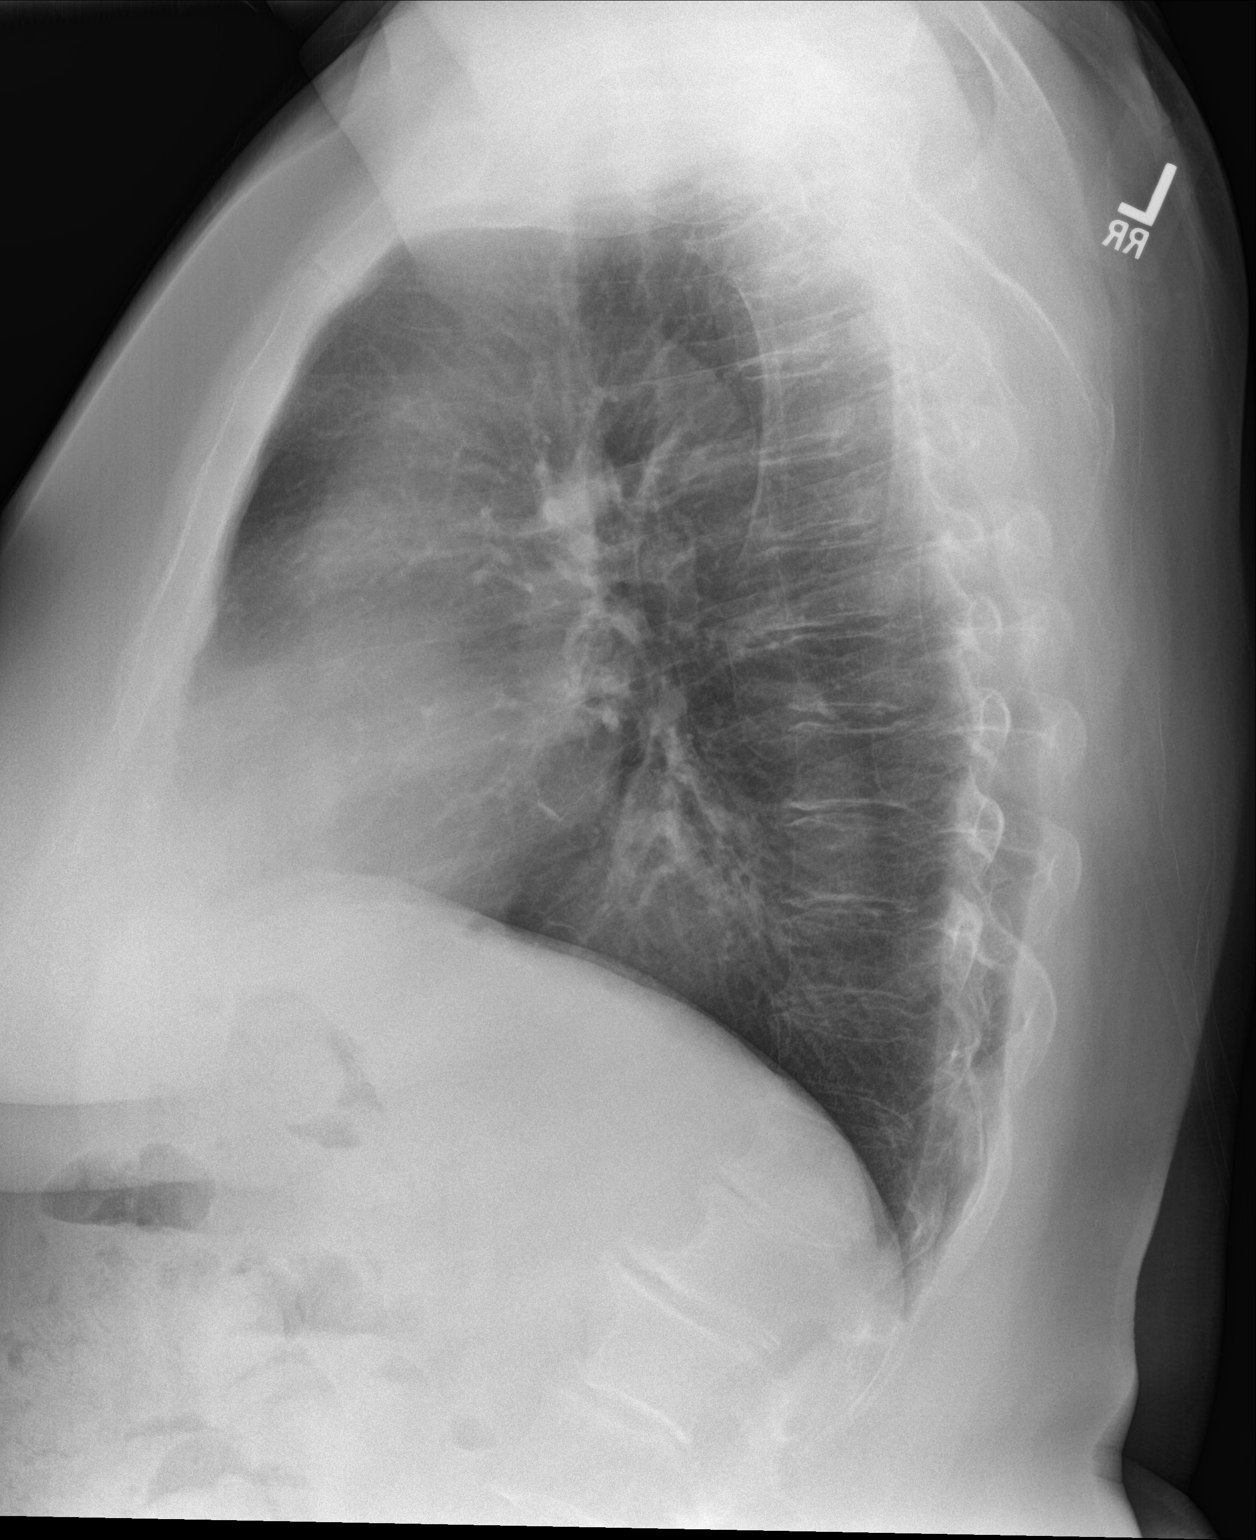

[2 of 2 positions shown; findings below may reference images not displayed]

FINDINGS: The cardiac silhouette remains near the upper limit of normal in
size. Moderately large hiatal hernia with an increase in size since
12/10/2017. Small linear scar at the left lung base with slight
improvement. Otherwise, clear lungs. Mild peribronchial thickening.
Old, healed right 5th rib fracture. Thoracic and thoracolumbar spine
degenerative changes.
IMPRESSION: 1. Mild bronchitic changes with mild progression.
2. Moderately large hiatal hernia.

## 2020-09-12 DIAGNOSIS — M545 Low back pain, unspecified: Secondary | ICD-10-CM | POA: Diagnosis not present

## 2020-09-12 DIAGNOSIS — N281 Cyst of kidney, acquired: Secondary | ICD-10-CM | POA: Diagnosis not present

## 2020-09-26 ENCOUNTER — Ambulatory Visit (INDEPENDENT_AMBULATORY_CARE_PROVIDER_SITE_OTHER): Payer: 59 | Admitting: Emergency Medicine

## 2020-09-26 ENCOUNTER — Other Ambulatory Visit: Payer: Self-pay

## 2020-09-26 ENCOUNTER — Other Ambulatory Visit: Payer: Self-pay | Admitting: Emergency Medicine

## 2020-09-26 ENCOUNTER — Encounter: Payer: Self-pay | Admitting: Emergency Medicine

## 2020-09-26 VITALS — BP 112/69 | HR 78 | Temp 97.4°F | Resp 16 | Ht 64.0 in | Wt 252.0 lb

## 2020-09-26 DIAGNOSIS — G4733 Obstructive sleep apnea (adult) (pediatric): Secondary | ICD-10-CM | POA: Diagnosis not present

## 2020-09-26 DIAGNOSIS — E785 Hyperlipidemia, unspecified: Secondary | ICD-10-CM | POA: Diagnosis not present

## 2020-09-26 DIAGNOSIS — F411 Generalized anxiety disorder: Secondary | ICD-10-CM

## 2020-09-26 DIAGNOSIS — R7303 Prediabetes: Secondary | ICD-10-CM

## 2020-09-26 DIAGNOSIS — Z862 Personal history of diseases of the blood and blood-forming organs and certain disorders involving the immune mechanism: Secondary | ICD-10-CM

## 2020-09-26 DIAGNOSIS — M797 Fibromyalgia: Secondary | ICD-10-CM | POA: Diagnosis not present

## 2020-09-26 DIAGNOSIS — I2721 Secondary pulmonary arterial hypertension: Secondary | ICD-10-CM

## 2020-09-26 DIAGNOSIS — I10 Essential (primary) hypertension: Secondary | ICD-10-CM | POA: Diagnosis not present

## 2020-09-26 DIAGNOSIS — Z789 Other specified health status: Secondary | ICD-10-CM

## 2020-09-26 MED ORDER — BUSPIRONE HCL 7.5 MG PO TABS: 7.5000 mg | ORAL_TABLET | Freq: Two times a day (BID) | ORAL | 1 refills | Status: DC

## 2020-09-26 MED FILL — busPIRone HCL 7.5 MG TABS: 7.5 | 30 days supply | Qty: 60 | Fill #0

## 2020-09-26 NOTE — Patient Instructions (Addendum)
If you have lab work done today you will be contacted with your lab results within the next 2 weeks.  If you have not heard from [place] then please contact us. The fastest way to get your results is to register for My Chart.   IF you received an x-ray today, you will receive an invoice from [facility]. Please contact [facility] at [phone] with questions or concerns regarding your invoice.   IF you received labwork today, you will receive an invoice from [facility]. Please contact [facility] at [phone] with questions or concerns regarding your invoice.   Our billing staff will not be able to assist you with questions regarding bills from these companies.  You will be contacted with the lab results as soon as they are available. The fastest way to get your results is to activate your My Chart account. Instructions are located on the last page of this paperwork. If you have not heard from [place] regarding the results in 2 weeks, please contact this office.     [email or web address]  Generalized Anxiety Disorder, Adult Generalized anxiety disorder (GAD) is a mental health condition. Unlike normal worries, anxiety related to GAD is not triggered by a specific event. These worries do not fade or get better with time. GAD interferes with relationships, work, and school. GAD symptoms can vary from mild to severe. People with severe GAD can have intense waves of anxiety with physical symptoms that are similar to panic attacks. What are the causes? The exact cause of GAD is not known, but the following are believed to have an impact:  Differences in natural brain chemicals.  Genes passed down from parents to children.  Differences in the way threats are perceived.  Development during childhood.  Personality. What increases the risk? The following factors may make you more likely to develop this condition:  Being female.  Having a family history of anxiety  disorders.  Being very shy.  Experiencing very stressful life events, such as the death of a loved one.  Having a very stressful family environment. What are the signs or symptoms? People with GAD often worry excessively about many things in their lives, such as their health and family. Symptoms may also include:  Mental and emotional symptoms: ? Worrying excessively about natural disasters. ? Fear of being late. ? Difficulty concentrating. ? Fears that others are judging your performance.  Physical symptoms: ? Fatigue. ? Headaches, muscle tension, muscle twitches, trembling, or feeling shaky. ? Feeling like your heart is pounding or beating very fast. ? Feeling out of breath or like you cannot take a deep breath. ? Having trouble falling asleep or staying asleep, or experiencing restlessness. ? Sweating. ? Nausea, diarrhea, or irritable bowel syndrome (IBS).  Behavioral symptoms: ? Experiencing erratic moods or irritability. ? Avoidance of new situations. ? Avoidance of people. ? Extreme difficulty making decisions. How is this diagnosed? This condition is diagnosed based on your symptoms and medical history. You will also have a physical exam. Your health care provider may perform tests to rule out other possible causes of your symptoms. To be diagnosed with GAD, a person must have anxiety that:  Is out of his or her control.  Affects several different aspects of his or her life, such as work and relationships.  Causes distress that makes him or her unable to take part in normal activities.  Includes at least three symptoms of GAD, such as restlessness, fatigue, trouble concentrating, irritability, muscle tension, or  sleep problems. Before your health care provider can confirm a diagnosis of GAD, these symptoms must be present more days than they are not, and they must last for 6 months or longer. How is this treated? This condition may be treated with:  Medicine.  Antidepressant medicine is usually prescribed for long-term daily control. Anti-anxiety medicines may be added in severe cases, especially when panic attacks occur.  Talk therapy (psychotherapy). Certain types of talk therapy can be helpful in treating GAD by providing support, education, and guidance. Options include: ? Cognitive behavioral therapy (CBT). People learn coping skills and self-calming techniques to ease their physical symptoms. They learn to identify unrealistic thoughts and behaviors and to replace them with more appropriate thoughts and behaviors. ? Acceptance and commitment therapy (ACT). This treatment teaches people how to be mindful as a way to cope with unwanted thoughts and feelings. ? Biofeedback. This process trains you to manage your body's response (physiological response) through breathing techniques and relaxation methods. You will work with a therapist while machines are used to monitor your physical symptoms.  Stress management techniques. These include yoga, meditation, and exercise. A mental health specialist can help determine which treatment is best for you. Some people see improvement with one type of therapy. However, other people require a combination of therapies.   Follow these instructions at home: Lifestyle  Maintain a consistent routine and schedule.  Anticipate stressful situations. Create a plan, and allow extra time to work with your plan.  Practice stress management or self-calming techniques that you have learned from your therapist or your health care provider. General instructions  Take over-the-counter and prescription medicines only as told by your health care provider.  Understand that you are likely to have setbacks. Accept this and be kind to yourself as you persist to take better care of yourself.  Recognize and accept your accomplishments, even if you judge them as small.  Keep all follow-up visits as told by your health care provider.  This is important. Contact a health care provider if:  Your symptoms do not get better.  Your symptoms get worse.  You have signs of depression, such as: ? A persistently sad or irritable mood. ? Loss of enjoyment in activities that used to bring you joy. ? Change in weight or eating. ? Changes in sleeping habits. ? Avoiding friends or family members. ? Loss of energy for normal tasks. ? Feelings of guilt or worthlessness. Get help right away if:  You have serious thoughts about hurting yourself or others. If you ever feel like you may hurt yourself or others, or have thoughts about taking your own life, get help right away. Go to your nearest emergency department or:  Call your local emergency services (911 in the [place]  Call a suicide crisis helpline, such as the [facility] at (907)489-7606. This is open 24 hours a day in the [place]  Text the Crisis Text Line at 859-535-3493 (in the [place].). Summary  Generalized anxiety disorder (GAD) is a mental health condition that involves worry that is not triggered by a specific event.  People with GAD often worry excessively about many things in their lives, such as their health and family.  GAD may cause symptoms such as restlessness, trouble concentrating, sleep problems, frequent sweating, nausea, diarrhea, headaches, and trembling or muscle twitching.  A mental health specialist can help determine which treatment is best for you. Some people see improvement with one type of therapy. However, other people require  a combination of therapies. This information is not intended to replace advice given to you by your health care provider. Make sure you discuss any questions you have with your health care provider. Document Revised: 05/11/2019 Document Reviewed: 05/11/2019 Elsevier Patient Education  [place].

## 2020-09-26 NOTE — Progress Notes (Signed)
AHNA KONKLE 65 y.o.   Chief Complaint  Patient presents with  . Transitions Of Care  . Anxiety    Per patient when she gets ready to go somewhere she anxious not sure why.    HISTORY OF PRESENT ILLNESS: This is a 65 y.o. female complaining of generalized anxiety and depression for the past several weeks. Lots of family issues and problems at present time. GAD 7 : Generalized Anxiety Score 09/26/2020 09/13/2019 04/21/2017  Nervous, Anxious, on Edge 2 3 3   Control/stop worrying 1 3 3   Worry too much - different things 2 3 3   Trouble relaxing 2 3 2   Restless 1 2 2   Easily annoyed or irritable 1 2 2   Afraid - awful might happen 1 2 3   Total GAD 7 Score 10 18 18   Anxiety Difficulty Somewhat difficult - Somewhat difficult   Has multiple chronic medical problems including the following: 1.  Chronic pain syndrome secondary to fibromyalgia.  Takes gabapentin several times a day with good results. 2.  Chronic abdominal pain.  Recent upper and lower endoscopies results reviewed with patient.  Has polyps and hiatal hernia. 3.  Moderate hiatal hernia with occasional reflux symptoms. 4.  Dyspnea on exertion with history of mild diastolic heart failure.  Also has history of pulmonary arterial hypertension. 5.  Chronic depression and anxiety 6.  Essential hypertension 7.  Obstructive sleep apnea on CPAP therapy 8.  Iron deficiency anemia   HPI   Prior to Admission medications   Medication Sig Start Date End Date Taking? Authorizing Provider  albuterol (PROAIR HFA) 108 (90 Base) MCG/ACT inhaler Inhale 2 puffs into the lungs every 6 (six) hours as needed for wheezing or shortness of breath. 04/05/20   Jacelyn Pi, Lilia Argue, MD  atorvastatin (LIPITOR) 10 MG tablet Take 1 tablet (10 mg total) by mouth daily. 07/04/20   Horald Pollen, MD  cyclobenzaprine (FLEXERIL) 10 MG tablet TAKE 1/2-1 TABLET BY MOUTH 3 TIMES DAILY AS NEEDED FOR MUSCLE SPASMS. 04/13/20   Daleen Squibb, MD   dexlansoprazole (DEXILANT) 60 MG capsule Take 1 capsule (60 mg total) by mouth daily. 07/04/20   Horald Pollen, MD  DULoxetine (CYMBALTA) 60 MG capsule Take 1 capsule (60 mg total) by mouth daily. 07/04/20   Horald Pollen, MD  fluticasone [facility]) 50 MCG/ACT nasal spray Place 1 spray into both nostrils daily. 12/13/19   Daleen Squibb, MD  furosemide (LASIX) 20 MG tablet Take 1 tablet (20 mg total) by mouth 2 (two) times daily as needed. 09/13/19   Daleen Squibb, MD  gabapentin (NEURONTIN) 300 MG capsule TAKE 1 TO 2 CAPSULES BY MOUTH 4 TIMES A DAY 07/04/20   Horald Pollen, MD  lisinopril (ZESTRIL) 20 MG tablet Take 1 tablet (20 mg total) by mouth daily. 07/04/20   Horald Pollen, MD  ondansetron (ZOFRAN-ODT) 4 MG disintegrating tablet Take 1 tablet (4 mg total) by mouth every 8 (eight) hours as needed for nausea or vomiting. 07/04/20   Horald Pollen, MD  propranolol (INDERAL) 60 MG tablet TAKE 1 TABLET (60 MG TOTAL) BY MOUTH 3 (THREE) TIMES DAILY. 04/26/20   Lendon Colonel, NP  sertraline (ZOLOFT) 100 MG tablet Take 1 tablet (100 mg total) by mouth daily. 07/04/20   Horald Pollen, MD  topiramate (TOPAMAX) 50 MG tablet TAKE 1 TABLET BY MOUTH TWICE DAILY 04/26/20   Wendie Agreste, MD    Allergies  Allergen Reactions  .  Sulfa Antibiotics Itching    Patient Active Problem List   Diagnosis Date Noted  . Pulmonary arterial hypertension ([place]) 07/04/2020  . Kidney cysts 07/04/2020  . Hyperlipidemia 07/04/2020  . Depression 07/04/2020  . Iron deficiency anemia 10/13/2018  . DOE (dyspnea on exertion) 03/23/2018  . Obesity, morbid, BMI 40.0-49.9 ([place]) 03/23/2018  . Chronic diastolic heart failure ([place]) 03/23/2018  . Grade I diastolic dysfunction 54/04/8118  . Migraine without aura and without status migrainosus, not intractable 03/02/2017  . Class 3 severe obesity due to excess calories with serious comorbidity and body mass index (BMI)  of 40.0 to 44.9 in adult ([place]) 01/13/2017  . Hiatal hernia 10/28/2016  . Pure hypercholesterolemia 10/28/2016  . Chronic kidney disease 06/01/2015  . Gastroesophageal reflux disease 06/01/2015  . Goiter 06/01/2015  . Essential hypertension 06/01/2015  . Chronic pain syndrome 05/28/2015  . Fibromyalgia 05/28/2015  . OSA (obstructive sleep apnea) 05/28/2015  . Adjustment disorder with mixed anxiety and depressed mood 05/28/2015    Past Medical History:  Diagnosis Date  . Allergy    [name], Flonase  . Anemia   . Anxiety   . Arthritis   . Chronic kidney disease   . Colon polyps   . Depression   . Fibromyalgia   . Gastritis   . GERD (gastroesophageal reflux disease)   . Hernia, hiatal   . High risk for colon cancer   . HLD (hyperlipidemia)   . Hypertension   . Migraine   . Neuropathy   . Osteoporosis   . Pneumonia   . Sleep apnea    c-pap nightly  . Sleep apnea with use of continuous positive airway pressure (CPAP)   . Thyroid goiter     Past Surgical History:  Procedure Laterality Date  . 48-Hour Holter Monitor  06/2017   Mostly normal sinus rhythm.  Average heart rate 71 bpm.  Minimum 54 bpm.  Maximum sinus rate 114 bpm.  Rare PACs and PVCs.  No A. fib, atrial flutter, SVT or VT.  One brief run of 8 beat PAT.  Marland Kitchen ABDOMINAL HYSTERECTOMY     ovaries intact; DUB.  No dysplasia.  Marland Kitchen FRACTURE SURGERY N/A    Phreesia 09/25/2020  . JOINT REPLACEMENT    . OPEN REDUCTION INTERNAL FIXATION (ORIF) DISTAL RADIAL FRACTURE Right 07/09/2017   Procedure: RIGHT WRIST OPEN REDUCTION INTERNAL FIXATION (ORIF) DISTAL RADIAL FRACTURE AND REPAIR AS INDICATED;  Surgeon: Iran Planas, MD;  Location: [place];  Service: Orthopedics;  Laterality: Right;  . PARTIAL HYSTERECTOMY    . ROTATOR CUFF REPAIR Right 2009  . SHOULDER SURGERY Right    shoulder dislocation, fall, and elbow unla nerve  . SHOULDER SURGERY Right 2015   labral tear  . TRANSTHORACIC ECHOCARDIOGRAM  06/2017    EF 55-60%.   grade 1 diastolic dysfunction.  Otherwise normal  . TUBAL LIGATION    . ULNAR NERVE REPAIR Right 08/19/2014    Social History   Socioeconomic History  . Marital status: Married    Spouse name: Not on file  . Number of children: 3  . Years of education: Not on file  . Highest education level: Not on file  Occupational History  . Occupation: unemployed  Tobacco Use  . Smoking status: Former Smoker    Quit date: 08/04/1998    Years since quitting: 22.1  . Smokeless tobacco: Never Used  Vaping Use  . Vaping Use: Never used  Substance and Sexual Activity  . Alcohol use: No  . Drug  use: No  . Sexual activity: Yes    Birth control/protection: Surgical, Post-menopausal  Other Topics Concern  . Not on file  Social History Narrative   Marital status: married x 20 years; moderately happy     Children: 3 children (47 [name], 72 Dianna, 40 April); 8 grandchildren; 0 gg     Lives: with husband/Steve, April, 2 granddaughters     Employment:  Unemployed; quit working 2015 with work related injury; awaiting disability in 2018; disability approved in 02/2017.       Tobacco: quit smoking in 2016; smoked x 2 years.      Alcohol: none      Drugs: none      Exercise: rarely in 2018         Social Determinants of Health   Financial Resource Strain: Not on file  Food Insecurity: Not on file  Transportation Needs: Not on file  Physical Activity: Not on file  Stress: Not on file  Social Connections: Not on file  Intimate Partner Violence: Not on file    Family History  Problem Relation Age of Onset  . Heart disease Mother 63       AMI/CAD/CHF as cause of death  . Hypertension Mother   . Hyperlipidemia Mother   . Hypothyroidism Mother   . Depression Mother   . Gout Mother   . Heart disease Father 65       AMI  . Hypertension Father   . Stroke Father 34       mild CVA  . Prostate cancer Father   . Hyperlipidemia Father   . Cancer Father 61       prostate cancer  . Hypertension  Brother   . Hyperlipidemia Brother   . Cancer Brother        prostate cancer  . Cancer Maternal Grandmother        type unknown  . Cancer Maternal Grandfather        type unknown  . Heart disease Paternal Grandmother   . Heart defect Paternal Grandfather   . Melanoma Brother   . Hyperlipidemia Brother   . Hypertension Brother   . Cancer Brother        melanoma  . Hypertension Brother   . Hyperlipidemia Brother   . Melanoma Brother   . Cancer Brother 25       melanoma  . Cancer Sister        Basal cell carcinoma scalp  . Depression Sister   . Hypertension Brother   . Depression Brother   . Hypertension Brother   . Gout Brother   . Arthritis Sister   . Depression Sister   . Colon cancer Neg Hx   . Esophageal cancer Neg Hx   . Stomach cancer Neg Hx   . Rectal cancer Neg Hx      Review of Systems  Constitutional: Negative.  Negative for chills and fever.  HENT: Negative.  Negative for congestion and sore throat.   Respiratory: Negative.  Negative for cough and shortness of breath.   Cardiovascular: Negative.  Negative for chest pain and palpitations.  Gastrointestinal: Negative.  Negative for abdominal pain, diarrhea, nausea and vomiting.  Genitourinary: Negative.  Negative for dysuria and hematuria.  Musculoskeletal: Negative.  Negative for myalgias.  Skin: Negative.  Negative for rash.  Neurological: Negative.  Negative for dizziness and headaches.  All other systems reviewed and are negative.    Today's Vitals   09/26/20 1324  BP: 112/69  Pulse: 78  Resp: 16  Temp: (!) 97.4 F (36.3 C)  TempSrc: Temporal  SpO2: 95%  Weight: 252 lb (114.3 kg)  Height: 5\' 4"  (1.626 m)   Body mass index is 43.26 kg/m. Wt Readings from Last 3 Encounters:  09/26/20 252 lb (114.3 kg)  07/04/20 257 lb (116.6 kg)  04/03/20 253 lb (114.8 kg)     Physical Exam Vitals reviewed.  Constitutional:      Appearance: Normal appearance. She is obese.  HENT:     Head:  Normocephalic.  Eyes:     Extraocular Movements: Extraocular movements intact.     Conjunctiva/sclera: Conjunctivae normal.     Pupils: Pupils are equal, round, and reactive to light.  Cardiovascular:     Rate and Rhythm: Normal rate and regular rhythm.     Pulses: Normal pulses.     Heart sounds: Normal heart sounds.  Pulmonary:     Effort: Pulmonary effort is normal.     Breath sounds: Normal breath sounds.  Musculoskeletal:     Cervical back: Normal range of motion and neck supple.  Skin:    General: Skin is warm and dry.     Capillary Refill: Capillary refill takes less than 2 seconds.  Neurological:     General: No focal deficit present.     Mental Status: She is alert and oriented to person, place, and time.  Psychiatric:        Mood and Affect: Mood normal.        Behavior: Behavior normal.      ASSESSMENT & PLAN: Amandamarie was seen today for transitions of care and anxiety.  Diagnoses and all orders for this visit:  Generalized anxiety disorder -     busPIRone (BUSPAR) 7.5 MG tablet; Take 1 tablet (7.5 mg total) by mouth 2 (two) times daily. -     CBC with Differential/Platelet -     TSH  Pulmonary arterial hypertension (HCC)  Obesity, morbid, BMI 40.0-49.9 (HCC)  Fibromyalgia -     CBC with Differential/Platelet  Essential hypertension -     Comprehensive metabolic panel -     PTH, intact and calcium  Hyperlipidemia, unspecified hyperlipidemia type -     Lipid panel  OSA (obstructive sleep apnea)  Prediabetes -     Hemoglobin A1c  History of anemia -     Anemia panel    Patient Instructions       If you have lab work done today you will be contacted with your lab results within the next 2 weeks.  If you have not heard from [place] then please contact us. The fastest way to get your results is to register for My Chart.   IF you received an x-ray today, you will receive an invoice from [facility]. Please contact [facility] at  [phone] with questions or concerns regarding your invoice.   IF you received labwork today, you will receive an invoice from [facility]. Please contact [facility] at [phone] with questions or concerns regarding your invoice.   Our billing staff will not be able to assist you with questions regarding bills from these companies.  You will be contacted with the lab results as soon as they are available. The fastest way to get your results is to activate your My Chart account. Instructions are located on the last page of this paperwork. If you have not heard from [place] regarding the results in 2 weeks, please contact this office.     [email or web address]">  Generalized Anxiety Disorder, Adult Generalized anxiety disorder (GAD) is a mental health condition. Unlike normal worries, anxiety related to GAD is not triggered by a specific event. These worries do not fade or get better with time. GAD interferes with relationships, work, and school. GAD symptoms can vary from mild to severe. People with severe GAD can have intense waves of anxiety with physical symptoms that are similar to panic attacks. What are the causes? The exact cause of GAD is not known, but the following are believed to have an impact:  Differences in natural brain chemicals.  Genes passed down from parents to children.  Differences in the way threats are perceived.  Development during childhood.  Personality. What increases the risk? The following factors may make you more likely to develop this condition:  Being female.  Having a family history of anxiety disorders.  Being very shy.  Experiencing very stressful life events, such as the death of a loved one.  Having a very stressful family environment. What are the signs or symptoms? People with GAD often worry excessively about many things in their lives, such as their health and family. Symptoms may also include:  Mental and emotional symptoms: ? Worrying  excessively about natural disasters. ? Fear of being late. ? Difficulty concentrating. ? Fears that others are judging your performance.  Physical symptoms: ? Fatigue. ? Headaches, muscle tension, muscle twitches, trembling, or feeling shaky. ? Feeling like your heart is pounding or beating very fast. ? Feeling out of breath or like you cannot take a deep breath. ? Having trouble falling asleep or staying asleep, or experiencing restlessness. ? Sweating. ? Nausea, diarrhea, or irritable bowel syndrome (IBS).  Behavioral symptoms: ? Experiencing erratic moods or irritability. ? Avoidance of new situations. ? Avoidance of people. ? Extreme difficulty making decisions. How is this diagnosed? This condition is diagnosed based on your symptoms and medical history. You will also have a physical exam. Your health care provider may perform tests to rule out other possible causes of your symptoms. To be diagnosed with GAD, a person must have anxiety that:  Is out of his or her control.  Affects several different aspects of his or her life, such as work and relationships.  Causes distress that makes him or her unable to take part in normal activities.  Includes at least three symptoms of GAD, such as restlessness, fatigue, trouble concentrating, irritability, muscle tension, or sleep problems. Before your health care provider can confirm a diagnosis of GAD, these symptoms must be present more days than they are not, and they must last for 6 months or longer. How is this treated? This condition may be treated with:  Medicine. Antidepressant medicine is usually prescribed for long-term daily control. Anti-anxiety medicines may be added in severe cases, especially when panic attacks occur.  Talk therapy (psychotherapy). Certain types of talk therapy can be helpful in treating GAD by providing support, education, and guidance. Options include: ? Cognitive behavioral therapy (CBT). People learn  coping skills and self-calming techniques to ease their physical symptoms. They learn to identify unrealistic thoughts and behaviors and to replace them with more appropriate thoughts and behaviors. ? Acceptance and commitment therapy (ACT). This treatment teaches people how to be mindful as a way to cope with unwanted thoughts and feelings. ? Biofeedback. This process trains you to manage your body's response (physiological response) through breathing techniques and relaxation methods. You will work with a therapist while machines are used to monitor your physical symptoms.  Stress management techniques. These include yoga, meditation, and exercise. A mental health specialist can help determine which treatment is best for you. Some people see improvement with one type of therapy. However, other people require a combination of therapies.   Follow these instructions at home: Lifestyle  Maintain a consistent routine and schedule.  Anticipate stressful situations. Create a plan, and allow extra time to work with your plan.  Practice stress management or self-calming techniques that you have learned from your therapist or your health care provider. General instructions  Take over-the-counter and prescription medicines only as told by your health care provider.  Understand that you are likely to have setbacks. Accept this and be kind to yourself as you persist to take better care of yourself.  Recognize and accept your accomplishments, even if you judge them as small.  Keep all follow-up visits as told by your health care provider. This is important. Contact a health care provider if:  Your symptoms do not get better.  Your symptoms get worse.  You have signs of depression, such as: ? A persistently sad or irritable mood. ? Loss of enjoyment in activities that used to bring you joy. ? Change in weight or eating. ? Changes in sleeping habits. ? Avoiding friends or family members. ? Loss  of energy for normal tasks. ? Feelings of guilt or worthlessness. Get help right away if:  You have serious thoughts about hurting yourself or others. If you ever feel like you may hurt yourself or others, or have thoughts about taking your own life, get help right away. Go to your nearest emergency department or:  Call your local emergency services (911 in the [place]  Call a suicide crisis helpline, such as the [facility] at (229)107-2624. This is open 24 hours a day in the [place]  Text the Crisis Text Line at 724-535-1333 (in the [place].). Summary  Generalized anxiety disorder (GAD) is a mental health condition that involves worry that is not triggered by a specific event.  People with GAD often worry excessively about many things in their lives, such as their health and family.  GAD may cause symptoms such as restlessness, trouble concentrating, sleep problems, frequent sweating, nausea, diarrhea, headaches, and trembling or muscle twitching.  A mental health specialist can help determine which treatment is best for you. Some people see improvement with one type of therapy. However, other people require a combination of therapies. This information is not intended to replace advice given to you by your health care provider. Make sure you discuss any questions you have with your health care provider. Document Revised: 05/11/2019 Document Reviewed: 05/11/2019 Elsevier Patient Education  2021 Elsevier Inc.      Agustina Caroli, MD [facility]

## 2020-09-26 NOTE — Addendum Note (Signed)
Addended by: Davina Poke on: 09/26/2020 02:37 PM   Modules accepted: Orders

## 2020-09-27 LAB — CBC WITH DIFFERENTIAL/PLATELET
Basophils Absolute: 0 10*3/uL (ref 0.0–0.2)
Basos: 0 %
EOS (ABSOLUTE): 0.1 10*3/uL (ref 0.0–0.4)
Eos: 2 %
Hemoglobin: 13.1 g/dL (ref 11.1–15.9)
Immature Grans (Abs): 0 10*3/uL (ref 0.0–0.1)
Immature Granulocytes: 0 %
Lymphocytes Absolute: 1.8 10*3/uL (ref 0.7–3.1)
Lymphs: 30 %
MCH: 29.2 pg (ref 26.6–33.0)
MCHC: 31.5 g/dL (ref 31.5–35.7)
MCV: 93 fL (ref 79–97)
Monocytes Absolute: 0.4 10*3/uL (ref 0.1–0.9)
Monocytes: 7 %
Neutrophils Absolute: 3.6 10*3/uL (ref 1.4–7.0)
Neutrophils: 61 %
Platelets: 210 10*3/uL (ref 150–450)
RBC: 4.49 x10E6/uL (ref 3.77–5.28)
RDW: 12.9 % (ref 11.7–15.4)
WBC: 5.9 10*3/uL (ref 3.4–10.8)

## 2020-09-27 LAB — LIPID PANEL
Chol/HDL Ratio: 2.7 ratio (ref 0.0–4.4)
Cholesterol, Total: 167 mg/dL (ref 100–199)
HDL: 63 mg/dL (ref >39)
LDL Chol Calc (NIH): 84 mg/dL (ref 0–99)
Triglycerides: 110 mg/dL (ref 0–149)
VLDL Cholesterol Cal: 20 mg/dL (ref 5–40)

## 2020-09-27 LAB — COMPREHENSIVE METABOLIC PANEL
ALT: 10 IU/L (ref 0–32)
AST: 21 IU/L (ref 0–40)
Albumin/Globulin Ratio: 1.9 (ref 1.2–2.2)
Albumin: 4.4 g/dL (ref 3.8–4.8)
Alkaline Phosphatase: 94 IU/L (ref 44–121)
BUN/Creatinine Ratio: 10 — ABNORMAL LOW (ref 12–28)
BUN: 10 mg/dL (ref 8–27)
Bilirubin Total: <0.2 mg/dL (ref 0.0–1.2)
CO2: 20 mmol/L (ref 20–29)
Calcium: 9.2 mg/dL (ref 8.7–10.3)
Chloride: 104 mmol/L (ref 96–106)
Creatinine, Ser: 0.98 mg/dL (ref 0.57–1.00)
GFR calc Af Amer: 71 mL/min/{1.73_m2} (ref >59)
GFR calc non Af Amer: 61 mL/min/{1.73_m2} (ref >59)
Globulin, Total: 2.3 g/dL (ref 1.5–4.5)
Glucose: 99 mg/dL (ref 65–99)
Potassium: 4.4 mmol/L (ref 3.5–5.2)
Sodium: 139 mmol/L (ref 134–144)
Total Protein: 6.7 g/dL (ref 6.0–8.5)

## 2020-09-27 LAB — ANEMIA PANEL
Ferritin: 17 ng/mL (ref 15–150)
Folate, Hemolysate: 349 ng/mL
Folate, RBC: 839 ng/mL (ref >498)
Hematocrit: 41.6 % (ref 34.0–46.6)
Iron Saturation: 16 % (ref 15–55)
Iron: 52 ug/dL (ref 27–139)
Retic Ct Pct: 1.4 % (ref 0.6–2.6)
Total Iron Binding Capacity: 333 ug/dL (ref 250–450)
UIBC: 281 ug/dL (ref 118–369)
Vitamin B-12: 207 pg/mL — ABNORMAL LOW (ref 232–1245)

## 2020-09-27 LAB — TSH: TSH: 2.95 u[IU]/mL (ref 0.450–4.500)

## 2020-09-27 LAB — SAR COV2 SEROLOGY (COVID19)AB(IGG),IA
SARS-CoV-2 Semi-Quant IgG Ab: 470 AU/mL (ref <13.0)
SARS-CoV-2 Spike Ab Interp: POSITIVE

## 2020-09-27 LAB — HEMOGLOBIN A1C
Est. average glucose Bld gHb Est-mCnc: 111 mg/dL
Hgb A1c MFr Bld: 5.5 % (ref 4.8–5.6)

## 2020-09-27 LAB — PTH, INTACT AND CALCIUM: PTH: 51 pg/mL (ref 15–65)

## 2020-10-24 ENCOUNTER — Other Ambulatory Visit: Payer: Self-pay | Admitting: Family Medicine

## 2020-10-24 DIAGNOSIS — G43909 Migraine, unspecified, not intractable, without status migrainosus: Secondary | ICD-10-CM

## 2020-10-24 MED FILL — PROPRANOLOL 60 MG TABLET: 60 | 90 days supply | Qty: 270 | Fill #2

## 2020-10-24 MED FILL — ATORVASTATIN CALCIUM 10 MG: 10 | 90 days supply | Qty: 90 | Fill #1

## 2020-10-24 MED FILL — SERTRALINE HCL 100 MG TAB: 100 | 90 days supply | Qty: 90 | Fill #1

## 2020-10-24 MED FILL — DULoxetine HCL 60 MG CPEP: 60 | 90 days supply | Qty: 90 | Fill #1

## 2020-10-24 MED FILL — DEXLANSOPRAZOLE 60 MG CPDR: 60 | 90 days supply | Qty: 90 | Fill #1

## 2020-10-24 MED FILL — FLUTICASONE PROP 50 MCG SPR: 50 | 60 days supply | Qty: 16 | Fill #2

## 2020-10-24 MED FILL — GABAPENTIN 300 MG CAPSULE: 300 | 68 days supply | Qty: 540 | Fill #1

## 2020-10-24 MED FILL — LISINOPRIL 20 MG TABLET: 20 | 90 days supply | Qty: 90 | Fill #1

## 2020-10-24 MED FILL — busPIRone HCL 7.5 MG TABS: 7.5 | 30 days supply | Qty: 60 | Fill #1

## 2020-10-24 NOTE — Telephone Encounter (Signed)
Requested medication (s) are due for refill today: yes  Requested medication (s) are on the active medication list: yes  Last refill: 08/02/20  Future visit scheduled: yes  Notes to clinic:  not delegated   Requested Prescriptions  Pending Prescriptions Disp Refills   topiramate (TOPAMAX) 50 MG tablet [Pharmacy Med Name: TOPIRAMATE 50 MG TABLET 50 Tablet] 180 tablet 1    Sig: TAKE 1 TABLET BY MOUTH TWICE DAILY      Not Delegated - Neurology: Anticonvulsants - topiramate & zonisamide Failed - 10/24/2020  8:16 AM      Failed - This refill cannot be delegated      Passed - Cr in normal range and within 360 days    Creat  Date Value Ref Range Status  05/21/2016 1.01 (H) 0.50 - 0.99 mg/dL Final    Comment:      For patients > or = 65 years of age: The upper reference limit for Creatinine is approximately 13% higher for people identified as African-American.      Creatinine, Ser  Date Value Ref Range Status  09/26/2020 0.98 0.57 - 1.00 mg/dL Final    Comment:                   **Effective October 01, 2020 [facility] will begin**                  reporting the 2021 CKD-EPI creatinine equation that                  estimates kidney function without a race variable.           Passed - CO2 in normal range and within 360 days    CO2  Date Value Ref Range Status  09/26/2020 20 20 - 29 mmol/L Final          Passed - Valid encounter within last 12 months    Recent Outpatient Visits           4 weeks ago Generalized anxiety disorder   [facility] [place], Ines Bloomer, MD   3 months ago Encounter to establish care with new doctor   [facility] [place], [place], MD   5 months ago Chronic right-sided thoracic back pain   [facility] at [facility], Lilia Argue, MD   6 months ago Chronic right-sided thoracic back pain   [facility] at [facility], Lilia Argue, MD   7 months ago Acute cystitis with hematuria   [facility] at [facility], Lilia Argue, MD       Future Appointments             In 2 months [place], Ines Bloomer, MD [facility] at [facility]

## 2020-10-25 ENCOUNTER — Other Ambulatory Visit: Payer: Self-pay | Admitting: Emergency Medicine

## 2020-10-25 MED FILL — TOPIRAMATE 50 MG TABLET: 50 | 90 days supply | Qty: 180 | Fill #0

## 2020-12-24 ENCOUNTER — Other Ambulatory Visit: Payer: Self-pay

## 2020-12-25 ENCOUNTER — Other Ambulatory Visit (HOSPITAL_COMMUNITY): Payer: Self-pay

## 2020-12-25 ENCOUNTER — Ambulatory Visit (INDEPENDENT_AMBULATORY_CARE_PROVIDER_SITE_OTHER): Payer: 59 | Admitting: Emergency Medicine

## 2020-12-25 ENCOUNTER — Other Ambulatory Visit: Payer: Self-pay | Admitting: Emergency Medicine

## 2020-12-25 ENCOUNTER — Encounter: Payer: Self-pay | Admitting: Oncology

## 2020-12-25 ENCOUNTER — Encounter: Payer: Self-pay | Admitting: Emergency Medicine

## 2020-12-25 ENCOUNTER — Other Ambulatory Visit: Payer: Self-pay

## 2020-12-25 VITALS — BP 124/82 | HR 68 | Temp 98.3°F | Ht 64.0 in | Wt 244.0 lb

## 2020-12-25 DIAGNOSIS — M62838 Other muscle spasm: Secondary | ICD-10-CM | POA: Diagnosis not present

## 2020-12-25 DIAGNOSIS — I2721 Secondary pulmonary arterial hypertension: Secondary | ICD-10-CM

## 2020-12-25 DIAGNOSIS — E785 Hyperlipidemia, unspecified: Secondary | ICD-10-CM | POA: Diagnosis not present

## 2020-12-25 DIAGNOSIS — K219 Gastro-esophageal reflux disease without esophagitis: Secondary | ICD-10-CM

## 2020-12-25 DIAGNOSIS — G894 Chronic pain syndrome: Secondary | ICD-10-CM

## 2020-12-25 DIAGNOSIS — F4323 Adjustment disorder with mixed anxiety and depressed mood: Secondary | ICD-10-CM | POA: Diagnosis not present

## 2020-12-25 DIAGNOSIS — J309 Allergic rhinitis, unspecified: Secondary | ICD-10-CM

## 2020-12-25 DIAGNOSIS — I5032 Chronic diastolic (congestive) heart failure: Secondary | ICD-10-CM

## 2020-12-25 DIAGNOSIS — I10 Essential (primary) hypertension: Secondary | ICD-10-CM | POA: Diagnosis not present

## 2020-12-25 DIAGNOSIS — M797 Fibromyalgia: Secondary | ICD-10-CM

## 2020-12-25 DIAGNOSIS — F411 Generalized anxiety disorder: Secondary | ICD-10-CM | POA: Diagnosis not present

## 2020-12-25 DIAGNOSIS — R110 Nausea: Secondary | ICD-10-CM

## 2020-12-25 MED ORDER — BUSPIRONE HCL 7.5 MG PO TABS
7.5000 mg | ORAL_TABLET | Freq: Two times a day (BID) | ORAL | 1 refills | Status: DC
  Filled 2020-12-25: qty 180, 90d supply, fill #0
  Filled 2021-04-02: qty 180, 90d supply, fill #1

## 2020-12-25 MED ORDER — ALBUTEROL SULFATE HFA 108 (90 BASE) MCG/ACT IN AERS
1.0000 | INHALATION_SPRAY | RESPIRATORY_TRACT | 3 refills | Status: DC | PRN
  Filled 2020-12-25 – 2021-02-20 (×2): qty 18, 17d supply, fill #0

## 2020-12-25 MED ORDER — FLUTICASONE PROPIONATE 50 MCG/ACT NA SUSP
1.0000 | Freq: Every day | NASAL | 6 refills | Status: DC
  Filled 2020-12-25: qty 16, 60d supply, fill #0
  Filled 2021-10-07: qty 16, 60d supply, fill #1
  Filled 2021-11-21: qty 16, 60d supply, fill #2

## 2020-12-25 MED ORDER — CYCLOBENZAPRINE HCL 10 MG PO TABS
10.0000 mg | ORAL_TABLET | Freq: Three times a day (TID) | ORAL | 1 refills | Status: DC | PRN
  Filled 2020-12-25: qty 180, 60d supply, fill #0
  Filled 2021-04-02: qty 180, 60d supply, fill #1

## 2020-12-25 MED ORDER — ONDANSETRON 4 MG PO TBDP
4.0000 mg | ORAL_TABLET | Freq: Three times a day (TID) | ORAL | 4 refills | Status: DC | PRN
  Filled 2020-12-25: qty 20, 7d supply, fill #0
  Filled 2021-02-05: qty 20, 7d supply, fill #1
  Filled 2021-04-02: qty 20, 7d supply, fill #2
  Filled 2021-06-05: qty 20, 7d supply, fill #3
  Filled 2021-07-08: qty 20, 7d supply, fill #4

## 2020-12-25 MED ORDER — ALBUTEROL SULFATE 108 (90 BASE) MCG/ACT IN AEPB
2.0000 | INHALATION_SPRAY | Freq: Four times a day (QID) | RESPIRATORY_TRACT | 3 refills | Status: DC | PRN
  Filled 2020-12-25: qty 1, 25d supply, fill #0
  Filled 2020-12-25: qty 8, fill #0

## 2020-12-25 MED ORDER — BUTALBITAL-APAP-CAFFEINE 50-325-40 MG PO TABS
1.0000 | ORAL_TABLET | Freq: Four times a day (QID) | ORAL | 1 refills | Status: DC | PRN
  Filled 2020-12-25: qty 20, 5d supply, fill #0

## 2020-12-25 NOTE — Assessment & Plan Note (Signed)
Diet and nutrition discussed.  Continue atorvastatin 10 mg daily. 

## 2020-12-25 NOTE — Assessment & Plan Note (Signed)
Well-controlled.  Continue gabapentin and Cymbalta.

## 2020-12-25 NOTE — Progress Notes (Signed)
[name] 65 y.o.   Chief Complaint  Patient presents with  . Medication Refill    Pt would like a refill of flexeril, fluticasone, albuterol, and zofran.     HISTORY OF PRESENT ILLNESS: This is a 65 y.o. female here for follow-up and medication refill. Has multiple chronic medical problems including the following: 1.  Chronic pain syndrome secondary to fibromyalgia.  Takes gabapentin several times a day with good results. 2.  Chronic abdominal pain.  Recent upper and lower endoscopies results reviewed with patient.  Has polyps and hiatal hernia. 3.  Moderate hiatal hernia with occasional reflux symptoms. 4.  Dyspnea on exertion with history of mild diastolic heart failure.  Also has history of pulmonary arterial hypertension. 5.  Chronic depression and anxiety 6.  Essential hypertension 7.  Obstructive sleep apnea on CPAP therapy 8.  Iron deficiency anemia Today has no complaints or medical concerns.  HPI   Prior to Admission medications   Medication Sig Start Date End Date Taking? Authorizing Provider  albuterol (PROAIR HFA) 108 (90 Base) MCG/ACT inhaler Inhale 2 puffs into the lungs every 6 (six) hours as needed for wheezing or shortness of breath. 04/05/20  Yes Jacelyn Pi, Irma M, MD  atorvastatin (LIPITOR) 10 MG tablet TAKE 1 TABLET (10 MG TOTAL) BY MOUTH DAILY. 07/04/20 07/04/21 Yes Giovana Faciane, Ines Bloomer, MD  busPIRone (BUSPAR) 7.5 MG tablet TAKE 1 TABLET (7.5 MG TOTAL) BY MOUTH 2 (TWO) TIMES DAILY. 09/26/20 09/26/21 Yes Kirby, Ines Bloomer, MD  COVID-19 mRNA vaccine, [place], 30 MCG/0.3ML injection INJECT AS DIRECTED 05/21/20 05/21/21 Yes Carlyle Basques, MD  cyclobenzaprine (FLEXERIL) 10 MG tablet TAKE 1/2-1 TABLET BY MOUTH 3 TIMES DAILY AS NEEDED FOR MUSCLE SPASMS. 04/13/20 04/13/21 Yes Daleen Squibb, MD  dexlansoprazole (DEXILANT) 60 MG capsule TAKE 1 CAPSULE (60 MG TOTAL) BY MOUTH DAILY. 07/04/20 07/04/21 Yes Dejour Vos, Ines Bloomer, MD  DULoxetine (CYMBALTA) 60 MG  capsule TAKE 1 CAPSULE (60 MG TOTAL) BY MOUTH DAILY. 07/04/20 07/04/21 Yes Haziel Molner, Ines Bloomer, MD  furosemide (LASIX) 20 MG tablet Take 1 tablet (20 mg total) by mouth 2 (two) times daily as needed. 09/13/19  Yes Jacelyn Pi, Lilia Argue, MD  gabapentin (NEURONTIN) 300 MG capsule TAKE 1 TO 2 CAPSULES BY MOUTH 4 TIMES A DAY 07/04/20 07/04/21 Yes Jamicah Anstead, Ines Bloomer, MD  lisinopril (ZESTRIL) 20 MG tablet TAKE 1 TABLET (20 MG TOTAL) BY MOUTH DAILY. 07/04/20 07/04/21 Yes Keshawn Sundberg, Ines Bloomer, MD  ondansetron (ZOFRAN-ODT) 4 MG disintegrating tablet TAKE 1 TABLET (4 MG TOTAL) BY MOUTH EVERY 8 (EIGHT) HOURS AS NEEDED FOR NAUSEA OR VOMITING. 07/04/20 07/04/21 Yes Kvon Mcilhenny, Ines Bloomer, MD  propranolol (INDERAL) 60 MG tablet TAKE 1 TABLET (60 MG TOTAL) BY MOUTH 3 (THREE) TIMES DAILY. 04/26/20 04/26/21 Yes Lendon Colonel, NP  sertraline (ZOLOFT) 100 MG tablet TAKE 1 TABLET (100 MG TOTAL) BY MOUTH DAILY. 07/04/20 07/04/21 Yes Horald Pollen, MD  topiramate (TOPAMAX) 50 MG tablet TAKE 1 TABLET BY MOUTH TWICE DAILY 10/25/20 10/25/21 Yes Zenia Guest, Ines Bloomer, MD  fluticasone (FLONASE) 50 MCG/ACT nasal spray PLACE 1 SPRAY INTO BOTH NOSTRILS DAILY. 12/13/19 12/12/20  Daleen Squibb, MD    Allergies  Allergen Reactions  . Sulfa Antibiotics Itching    Patient Active Problem List   Diagnosis Date Noted  . Pulmonary arterial hypertension ([place]) 07/04/2020  . Kidney cysts 07/04/2020  . Hyperlipidemia 07/04/2020  . Depression 07/04/2020  . Iron deficiency anemia 10/13/2018  . DOE (dyspnea on exertion) 03/23/2018  .  Obesity, morbid, BMI 40.0-49.9 ([place]) 03/23/2018  . Chronic diastolic heart failure ([place]) 03/23/2018  . Grade I diastolic dysfunction 74/07/8785  . Migraine without aura and without status migrainosus, not intractable 03/02/2017  . Class 3 severe obesity due to excess calories with serious comorbidity and body mass index (BMI) of 40.0 to 44.9 in adult ([place]) 01/13/2017  . Hiatal hernia 10/28/2016   . Pure hypercholesterolemia 10/28/2016  . Chronic kidney disease 06/01/2015  . Gastroesophageal reflux disease 06/01/2015  . Goiter 06/01/2015  . Essential hypertension 06/01/2015  . Chronic pain syndrome 05/28/2015  . Fibromyalgia 05/28/2015  . OSA (obstructive sleep apnea) 05/28/2015  . Adjustment disorder with mixed anxiety and depressed mood 05/28/2015    Past Medical History:  Diagnosis Date  . Allergy    [name], Flonase  . Anemia   . Anxiety   . Arthritis   . Chronic kidney disease   . Colon polyps   . Depression   . Fibromyalgia   . Gastritis   . GERD (gastroesophageal reflux disease)   . Hernia, hiatal   . High risk for colon cancer   . HLD (hyperlipidemia)   . Hypertension   . Migraine   . Neuropathy   . Osteoporosis   . Pneumonia   . Sleep apnea    c-pap nightly  . Sleep apnea with use of continuous positive airway pressure (CPAP)   . Thyroid goiter     Past Surgical History:  Procedure Laterality Date  . 48-Hour Holter Monitor  06/2017   Mostly normal sinus rhythm.  Average heart rate 71 bpm.  Minimum 54 bpm.  Maximum sinus rate 114 bpm.  Rare PACs and PVCs.  No A. fib, atrial flutter, SVT or VT.  One brief run of 8 beat PAT.  Marland Kitchen ABDOMINAL HYSTERECTOMY     ovaries intact; DUB.  No dysplasia.  Marland Kitchen FRACTURE SURGERY N/A    Phreesia 09/25/2020  . JOINT REPLACEMENT    . OPEN REDUCTION INTERNAL FIXATION (ORIF) DISTAL RADIAL FRACTURE Right 07/09/2017   Procedure: RIGHT WRIST OPEN REDUCTION INTERNAL FIXATION (ORIF) DISTAL RADIAL FRACTURE AND REPAIR AS INDICATED;  Surgeon: Iran Planas, MD;  Location: [facility];  Service: Orthopedics;  Laterality: Right;  . PARTIAL HYSTERECTOMY    . ROTATOR CUFF REPAIR Right 2009  . SHOULDER SURGERY Right    shoulder dislocation, fall, and elbow unla nerve  . SHOULDER SURGERY Right 2015   labral tear  . TRANSTHORACIC ECHOCARDIOGRAM  06/2017    EF 55-60%.  grade 1 diastolic dysfunction.  Otherwise normal  . TUBAL LIGATION    .  ULNAR NERVE REPAIR Right 08/19/2014    Social History   Socioeconomic History  . Marital status: Married    Spouse name: Not on file  . Number of children: 3  . Years of education: Not on file  . Highest education level: Not on file  Occupational History  . Occupation: unemployed  Tobacco Use  . Smoking status: Former Smoker    Quit date: 08/04/1998    Years since quitting: 22.4  . Smokeless tobacco: Never Used  Vaping Use  . Vaping Use: Never used  Substance and Sexual Activity  . Alcohol use: No  . Drug use: No  . Sexual activity: Yes    Birth control/protection: Surgical, Post-menopausal  Other Topics Concern  . Not on file  Social History Narrative   Marital status: married x 20 years; moderately happy     Children: 3 children ([place], [place], 40 April);  8 grandchildren; 0 gg     Lives: with husband/Steve, April, 2 granddaughters     Employment:  Unemployed; quit working 2015 with work related injury; awaiting disability in 2018; disability approved in 02/2017.       Tobacco: quit smoking in 2016; smoked x 2 years.      Alcohol: none      Drugs: none      Exercise: rarely in 2018         Social Determinants of Health   Financial Resource Strain: Not on file  Food Insecurity: Not on file  Transportation Needs: Not on file  Physical Activity: Not on file  Stress: Not on file  Social Connections: Not on file  Intimate Partner Violence: Not on file    Family History  Problem Relation Age of Onset  . Heart disease Mother 52       AMI/CAD/CHF as cause of death  . Hypertension Mother   . Hyperlipidemia Mother   . Hypothyroidism Mother   . Depression Mother   . Gout Mother   . Heart disease Father 16       AMI  . Hypertension Father   . Stroke Father 42       mild CVA  . Prostate cancer Father   . Hyperlipidemia Father   . Cancer Father 2       prostate cancer  . Hypertension Brother   . Hyperlipidemia Brother   . Cancer Brother        prostate  cancer  . Cancer Maternal Grandmother        type unknown  . Cancer Maternal Grandfather        type unknown  . Heart disease Paternal Grandmother   . Heart defect Paternal Grandfather   . Melanoma Brother   . Hyperlipidemia Brother   . Hypertension Brother   . Cancer Brother        melanoma  . Hypertension Brother   . Hyperlipidemia Brother   . Melanoma Brother   . Cancer Brother 25       melanoma  . Cancer Sister        Basal cell carcinoma scalp  . Depression Sister   . Hypertension Brother   . Depression Brother   . Hypertension Brother   . Gout Brother   . Arthritis Sister   . Depression Sister   . Colon cancer Neg Hx   . Esophageal cancer Neg Hx   . Stomach cancer Neg Hx   . Rectal cancer Neg Hx      Review of Systems  Constitutional: Negative.  Negative for chills and fever.  HENT: Negative.  Negative for congestion and sore throat.   Respiratory: Negative.  Negative for cough and shortness of breath.   Cardiovascular: Negative.  Negative for chest pain and palpitations.  Gastrointestinal: Negative.  Negative for abdominal pain, diarrhea, nausea and vomiting.  Genitourinary: Negative.  Negative for dysuria and hematuria.  Musculoskeletal: Positive for back pain (Chronic right-sided lumbar pain).  Skin: Negative.  Negative for rash.  Neurological: Negative.  Negative for dizziness and headaches.  All other systems reviewed and are negative.   Today's Vitals   12/25/20 1308  BP: 124/82  Pulse: 68  Temp: 98.3 F (36.8 C)  TempSrc: Oral  SpO2: 96%  Weight: 244 lb (110.7 kg)  Height: 5\' 4"  (1.626 m)   Body mass index is 41.88 kg/m. Wt Readings from Last 3 Encounters:  12/25/20 244 lb (110.7 kg)  09/26/20  252 lb (114.3 kg)  07/04/20 257 lb (116.6 kg)    Physical Exam Vitals reviewed.  Constitutional:      Appearance: Normal appearance.  HENT:     Head: Normocephalic.  Eyes:     Extraocular Movements: Extraocular movements intact.     Pupils:  Pupils are equal, round, and reactive to light.  Cardiovascular:     Rate and Rhythm: Normal rate.     Pulses: Normal pulses.     Heart sounds: Normal heart sounds.  Abdominal:     General: Bowel sounds are normal. There is no distension.     Palpations: Abdomen is soft.     Tenderness: There is no abdominal tenderness.  Musculoskeletal:        General: Normal range of motion.  Skin:    General: Skin is warm and dry.     Capillary Refill: Capillary refill takes less than 2 seconds.  Neurological:     General: No focal deficit present.     Mental Status: She is alert and oriented to person, place, and time.  Psychiatric:        Mood and Affect: Mood normal.        Behavior: Behavior normal.      ASSESSMENT & PLAN: A total of 30 minutes was spent with the patient and counseling/coordination of care regarding multiple chronic medical problems, review of all medications, cardiovascular risks associated with hypertension and dyslipidemia, education on nutrition, review of most recent office visit notes, review of most recent blood work results, prognosis, documentation, health maintenance items, and need for follow-up.  Essential hypertension Well-controlled hypertension.  Continue lisinopril 20 mg daily and Inderal 60 mg 3 times daily.  Gastroesophageal reflux disease Well-controlled.  Continue with Dexilant 60 mg daily.  Adjustment disorder with mixed anxiety and depressed mood BuSpar 7.5 mg twice a day working well for her.  Also continue Cymbalta 60 mg daily and Zoloft 100 mg daily.  Chronic pain syndrome Well-controlled.  Continue gabapentin and Cymbalta.  Hyperlipidemia Diet and nutrition discussed.  Continue atorvastatin 10 mg daily.  Obesity, morbid, BMI 40.0-49.9 ([place]) Losing weight and eating better.  Diet and nutrition discussed.  Sherol was seen today for medication refill.  Diagnoses and all orders for this visit:  Essential hypertension  Pulmonary arterial  hypertension (HCC)  Fibromyalgia  Hyperlipidemia, unspecified hyperlipidemia type  Chronic diastolic heart failure (HCC)  Chronic pain syndrome  Adjustment disorder with mixed anxiety and depressed mood  Generalized anxiety disorder -     busPIRone (BUSPAR) 7.5 MG tablet; Take 1 tablet (7.5 mg total) by mouth 2 (two) times daily.  Neck muscle spasm -     cyclobenzaprine (FLEXERIL) 10 MG tablet; Take 1 tablet (10 mg total) by mouth 3 (three) times daily as needed for muscle spasms.  Allergic rhinitis, unspecified seasonality, unspecified trigger -     fluticasone (FLONASE) 50 MCG/ACT nasal spray; PLACE 1 SPRAY INTO BOTH NOSTRILS DAILY.  Nausea without vomiting -     ondansetron (ZOFRAN-ODT) 4 MG disintegrating tablet; TAKE 1 TABLET (4 MG TOTAL) BY MOUTH EVERY 8 (EIGHT) HOURS AS NEEDED FOR NAUSEA OR VOMITING.  Gastroesophageal reflux disease without esophagitis  Obesity, morbid, BMI 40.0-49.9 (HCC)  Other orders -     Albuterol Sulfate (PROAIR RESPICLICK) 220 (90 Base) MCG/ACT AEPB; Inhale 2 puffs into the lungs every 6 (six) hours as needed for wheezing or  shortness of breath. -     butalbital-acetaminophen-caffeine (FIORICET) 50-325-40 MG tablet; Take 1 tablet by  mouth every 6 (six) hours as needed for headache.    Patient Instructions   Health Maintenance, Female Adopting a healthy lifestyle and getting preventive care are important in promoting health and wellness. Ask your health care provider about:  The right schedule for you to have regular tests and exams.  Things you can do on your own to prevent diseases and keep yourself healthy. What should I know about diet, weight, and exercise? Eat a healthy diet  Eat a diet that includes plenty of vegetables, fruits, low-fat dairy products, and lean protein.  Do not eat a lot of foods that are high in solid fats, added sugars, or sodium.   Maintain a healthy weight Body mass index (BMI) is used to identify weight  problems. It estimates body fat based on height and weight. Your health care provider can help determine your BMI and help you achieve or maintain a healthy weight. Get regular exercise Get regular exercise. This is one of the most important things you can do for your health. Most adults should:  Exercise for at least 150 minutes each week. The exercise should increase your heart rate and make you sweat (moderate-intensity exercise).  Do strengthening exercises at least twice a week. This is in addition to the moderate-intensity exercise.  Spend less time sitting. Even light physical activity can be beneficial. Watch cholesterol and blood lipids Have your blood tested for lipids and cholesterol at 65 years of age, then have this test every 5 years. Have your cholesterol levels checked more often if:  Your lipid or cholesterol levels are high.  You are older than 65 years of age.  You are at high risk for heart disease. What should I know about cancer screening? Depending on your health history and family history, you may need to have cancer screening at various ages. This may include screening for:  Breast cancer.  Cervical cancer.  Colorectal cancer.  Skin cancer.  Lung cancer. What should I know about heart disease, diabetes, and high blood pressure? Blood pressure and heart disease  High blood pressure causes heart disease and increases the risk of stroke. This is more likely to develop in people who have high blood pressure readings, are of African descent, or are overweight.  Have your blood pressure checked: ? Every 3-5 years if you are 42-34 years of age. ? Every year if you are 101 years old or older. Diabetes Have regular diabetes screenings. This checks your fasting blood sugar level. Have the screening done:  Once every three years after age 47 if you are at a normal weight and have a low risk for diabetes.  More often and at a younger age if you are overweight or  have a high risk for diabetes. What should I know about preventing infection? Hepatitis B If you have a higher risk for hepatitis B, you should be screened for this virus. Talk with your health care provider to find out if you are at risk for hepatitis B infection. Hepatitis C Testing is recommended for:  Everyone born from [birth year] through 1965.  Anyone with known risk factors for hepatitis C. Sexually transmitted infections (STIs)  Get screened for STIs, including gonorrhea and chlamydia, if: ? You are sexually active and are younger than 65 years of age. ? You are older than 65 years of age and your health care provider tells you that you are at risk for this type of infection. ? Your sexual activity has changed since you were  last screened, and you are at increased risk for chlamydia or gonorrhea. Ask your health care provider if you are at risk.  Ask your health care provider about whether you are at high risk for HIV. Your health care provider may recommend a prescription medicine to help prevent HIV infection. If you choose to take medicine to prevent HIV, you should first get tested for HIV. You should then be tested every 3 months for as long as you are taking the medicine. Pregnancy  If you are about to stop having your period (premenopausal) and you may become pregnant, seek counseling before you get pregnant.  Take 400 to 800 micrograms (mcg) of folic acid every day if you become pregnant.  Ask for birth control (contraception) if you want to prevent pregnancy. Osteoporosis and menopause Osteoporosis is a disease in which the bones lose minerals and strength with aging. This can result in bone fractures. If you are 85 years old or older, or if you are at risk for osteoporosis and fractures, ask your health care provider if you should:  Be screened for bone loss.  Take a calcium or vitamin D supplement to lower your risk of fractures.  Be given hormone replacement therapy (HRT)  to treat symptoms of menopause. Follow these instructions at home: Lifestyle  Do not use any products that contain nicotine or tobacco, such as cigarettes, e-cigarettes, and chewing tobacco. If you need help quitting, ask your health care provider.  Do not use street drugs.  Do not share needles.  Ask your health care provider for help if you need support or information about quitting drugs. Alcohol use  Do not drink alcohol if: ? Your health care provider tells you not to drink. ? You are pregnant, may be pregnant, or are planning to become pregnant.  If you drink alcohol: ? Limit how much you use to 0-1 drink a day. ? Limit intake if you are breastfeeding.  Be aware of how much alcohol is in your drink. In the [place] one drink equals one 12 oz bottle of beer (355 mL), one 5 oz glass of wine (148 mL), or one 1 oz glass of hard liquor (44 mL). General instructions  Schedule regular health, dental, and eye exams.  Stay current with your vaccines.  Tell your health care provider if: ? You often feel depressed. ? You have ever been abused or do not feel safe at home. Summary  Adopting a healthy lifestyle and getting preventive care are important in promoting health and wellness.  Follow your health care provider's instructions about healthy diet, exercising, and getting tested or screened for diseases.  Follow your health care provider's instructions on monitoring your cholesterol and blood pressure. This information is not intended to replace advice given to you by your health care provider. Make sure you discuss any questions you have with your health care provider. Document Revised: 07/14/2018 Document Reviewed: 07/14/2018 Elsevier Patient Education  2021 [place], [place] [facility]

## 2020-12-25 NOTE — Assessment & Plan Note (Signed)
BuSpar 7.5 mg twice a day working well for her.  Also continue Cymbalta 60 mg daily and Zoloft 100 mg daily.

## 2020-12-25 NOTE — Assessment & Plan Note (Signed)
Losing weight and eating better.  Diet and nutrition discussed.

## 2020-12-25 NOTE — Assessment & Plan Note (Signed)
Well-controlled.  Continue with Dexilant 60 mg daily.

## 2020-12-25 NOTE — Assessment & Plan Note (Signed)
Well-controlled hypertension.  Continue lisinopril 20 mg daily and Inderal 60 mg 3 times daily.

## 2020-12-25 NOTE — Patient Instructions (Signed)
Health Maintenance, Female Adopting a healthy lifestyle and getting preventive care are important in promoting health and wellness. Ask your health care provider about:  The right schedule for you to have regular tests and exams.  Things you can do on your own to prevent diseases and keep yourself healthy. What should I know about diet, weight, and exercise? Eat a healthy diet  Eat a diet that includes plenty of vegetables, fruits, low-fat dairy products, and lean protein.  Do not eat a lot of foods that are high in solid fats, added sugars, or sodium.   Maintain a healthy weight Body mass index (BMI) is used to identify weight problems. It estimates body fat based on height and weight. Your health care provider can help determine your BMI and help you achieve or maintain a healthy weight. Get regular exercise Get regular exercise. This is one of the most important things you can do for your health. Most adults should:  Exercise for at least 150 minutes each week. The exercise should increase your heart rate and make you sweat (moderate-intensity exercise).  Do strengthening exercises at least twice a week. This is in addition to the moderate-intensity exercise.  Spend less time sitting. Even light physical activity can be beneficial. Watch cholesterol and blood lipids Have your blood tested for lipids and cholesterol at 65 years of age, then have this test every 5 years. Have your cholesterol levels checked more often if:  Your lipid or cholesterol levels are high.  You are older than 65 years of age.  You are at high risk for heart disease. What should I know about cancer screening? Depending on your health history and family history, you may need to have cancer screening at various ages. This may include screening for:  Breast cancer.  Cervical cancer.  Colorectal cancer.  Skin cancer.  Lung cancer. What should I know about heart disease, diabetes, and high blood  pressure? Blood pressure and heart disease  High blood pressure causes heart disease and increases the risk of stroke. This is more likely to develop in people who have high blood pressure readings, are of African descent, or are overweight.  Have your blood pressure checked: ? Every 3-5 years if you are 18-39 years of age. ? Every year if you are 40 years old or older. Diabetes Have regular diabetes screenings. This checks your fasting blood sugar level. Have the screening done:  Once every three years after age 40 if you are at a normal weight and have a low risk for diabetes.  More often and at a younger age if you are overweight or have a high risk for diabetes. What should I know about preventing infection? Hepatitis B If you have a higher risk for hepatitis B, you should be screened for this virus. Talk with your health care provider to find out if you are at risk for hepatitis B infection. Hepatitis C Testing is recommended for:  Everyone born from 1945 through 1965.  Anyone with known risk factors for hepatitis C. Sexually transmitted infections (STIs)  Get screened for STIs, including gonorrhea and chlamydia, if: ? You are sexually active and are younger than 65 years of age. ? You are older than 65 years of age and your health care provider tells you that you are at risk for this type of infection. ? Your sexual activity has changed since you were last screened, and you are at increased risk for chlamydia or gonorrhea. Ask your health care provider   if you are at risk.  Ask your health care provider about whether you are at high risk for HIV. Your health care provider may recommend a prescription medicine to help prevent HIV infection. If you choose to take medicine to prevent HIV, you should first get tested for HIV. You should then be tested every 3 months for as long as you are taking the medicine. Pregnancy  If you are about to stop having your period (premenopausal) and  you may become pregnant, seek counseling before you get pregnant.  Take 400 to 800 micrograms (mcg) of folic acid every day if you become pregnant.  Ask for birth control (contraception) if you want to prevent pregnancy. Osteoporosis and menopause Osteoporosis is a disease in which the bones lose minerals and strength with aging. This can result in bone fractures. If you are 65 years old or older, or if you are at risk for osteoporosis and fractures, ask your health care provider if you should:  Be screened for bone loss.  Take a calcium or vitamin D supplement to lower your risk of fractures.  Be given hormone replacement therapy (HRT) to treat symptoms of menopause. Follow these instructions at home: Lifestyle  Do not use any products that contain nicotine or tobacco, such as cigarettes, e-cigarettes, and chewing tobacco. If you need help quitting, ask your health care provider.  Do not use street drugs.  Do not share needles.  Ask your health care provider for help if you need support or information about quitting drugs. Alcohol use  Do not drink alcohol if: ? Your health care provider tells you not to drink. ? You are pregnant, may be pregnant, or are planning to become pregnant.  If you drink alcohol: ? Limit how much you use to 0-1 drink a day. ? Limit intake if you are breastfeeding.  Be aware of how much alcohol is in your drink. In the [place] one drink equals one 12 oz bottle of beer (355 mL), one 5 oz glass of wine (148 mL), or one 1 oz glass of hard liquor (44 mL). General instructions  Schedule regular health, dental, and eye exams.  Stay current with your vaccines.  Tell your health care provider if: ? You often feel depressed. ? You have ever been abused or do not feel safe at home. Summary  Adopting a healthy lifestyle and getting preventive care are important in promoting health and wellness.  Follow your health care provider's instructions about healthy  diet, exercising, and getting tested or screened for diseases.  Follow your health care provider's instructions on monitoring your cholesterol and blood pressure. This information is not intended to replace advice given to you by your health care provider. Make sure you discuss any questions you have with your health care provider. Document Revised: 07/14/2018 Document Reviewed: 07/14/2018 Elsevier Patient Education  2021 Elsevier Inc.  

## 2021-01-02 ENCOUNTER — Ambulatory Visit (INDEPENDENT_AMBULATORY_CARE_PROVIDER_SITE_OTHER): Payer: 59 | Admitting: Physician Assistant

## 2021-01-02 ENCOUNTER — Encounter: Payer: Self-pay | Admitting: Physician Assistant

## 2021-01-02 ENCOUNTER — Other Ambulatory Visit (HOSPITAL_COMMUNITY): Payer: Self-pay

## 2021-01-02 VITALS — BP 110/80 | HR 63 | Ht 64.0 in | Wt 244.0 lb

## 2021-01-02 DIAGNOSIS — Z86010 Personal history of colonic polyps: Secondary | ICD-10-CM | POA: Diagnosis not present

## 2021-01-02 DIAGNOSIS — K635 Polyp of colon: Secondary | ICD-10-CM

## 2021-01-02 DIAGNOSIS — K625 Hemorrhage of anus and rectum: Secondary | ICD-10-CM | POA: Diagnosis not present

## 2021-01-02 MED ORDER — NA SULFATE-K SULFATE-MG SULF 17.5-3.13-1.6 GM/177ML PO SOLN
1.0000 | Freq: Once | ORAL | 0 refills | Status: AC
  Filled 2021-01-02: qty 354, 1d supply, fill #0

## 2021-01-02 NOTE — Progress Notes (Signed)
Subjective:    Patient ID: [name], female    DOB: 1955/12/20, 65 y.o.   MRN: [id number]  HPI [name] is a pleasant 65 year old white female, established with Dr. Ardis Hughs who comes in today with complaints of rectal bleeding. Patient describes an episode of bleeding that occurred on the weekend of May 20.  She says she had a normal bowel movement with no associated abdominal pain cramping or rectal pain, and passed a lot of of red blood mixed with her bowel movement. This has not recurred again over the last couple of weeks. She says she tends towards constipation which has been lifelong and usually has 2-3 bowel movements per week which is her norm.  She has history of iron deficiency anemia and does stay on oral iron supplement and a stool softener. Again she does not remember having any pain or discomfort suggestive of tear or fissure with this episode. Last colonoscopy was done in May 2020 for the iron deficiency with finding of 4 polyps the largest was 16 mm in size, no internal hemorrhoids noted. 3 of the polyps were tubular adenomas and 1 was a sessile serrated adenoma with high-grade dysplasia in the rectum.  She underwent sigmoidoscopy in June 2020 for reassessment the site was removed and tattooed and all path showed no residual adenoma.  She had repeat flex in December 2020 with no evidence of any residual adenomatous mucosa, the area was biopsied with finding of a benign hyperplastic polyp tissue.  She is indicated for repeat colonoscopy in May 2023.  Other medical problems include GERD, chronic kidney disease, obesity, fibromyalgia, pulmonary artery hypertension, sleep apnea no oxygen use and congestive heart failure, most recent echo 2020 normal.  Review of Systems Pertinent positive and negative review of systems were noted in the above HPI section.  All other review of systems was otherwise negative.  Outpatient Encounter Medications as of 01/02/2021  Medication Sig  .  albuterol (PROAIR HFA) 108 (90 Base) MCG/ACT inhaler Inhale 1-2 puffs into the lungs every 4 (four) hours as needed for wheezing or shortness of breath.  Marland Kitchen atorvastatin (LIPITOR) 10 MG tablet TAKE 1 TABLET (10 MG TOTAL) BY MOUTH DAILY.  . busPIRone (BUSPAR) 7.5 MG tablet Take 1 tablet (7.5 mg total) by mouth 2 (two) times daily.  . butalbital-acetaminophen-caffeine (FIORICET) 50-325-40 MG tablet Take 1 tablet by mouth every 6 (six) hours as needed for headache.  Marland Kitchen COVID-19 mRNA vaccine, Pfizer, 30 MCG/0.3ML injection INJECT AS DIRECTED  . cyclobenzaprine (FLEXERIL) 10 MG tablet Take 1 tablet (10 mg total) by mouth 3 (three) times daily as needed for muscle spasms.  Marland Kitchen dexlansoprazole (DEXILANT) 60 MG capsule TAKE 1 CAPSULE (60 MG TOTAL) BY MOUTH DAILY.  . DULoxetine (CYMBALTA) 60 MG capsule TAKE 1 CAPSULE (60 MG TOTAL) BY MOUTH DAILY.  . fluticasone (FLONASE) 50 MCG/ACT nasal spray Place 1 spray into both nostrils daily.  . furosemide (LASIX) 20 MG tablet Take 1 tablet (20 mg total) by mouth 2 (two) times daily as needed.  . gabapentin (NEURONTIN) 300 MG capsule TAKE 1 TO 2 CAPSULES BY MOUTH 4 TIMES A DAY  . lisinopril (ZESTRIL) 20 MG tablet TAKE 1 TABLET (20 MG TOTAL) BY MOUTH DAILY.  . Na Sulfate-K Sulfate-Mg Sulf 17.5-3.13-1.6 GM/177ML SOLN Take 1 kit by mouth once for 1 dose.  . ondansetron (ZOFRAN-ODT) 4 MG disintegrating tablet Take 1 tablet (4 mg total) by mouth every 8 (eight) hours as needed for nausea or vomiting.  . propranolol (  INDERAL) 60 MG tablet TAKE 1 TABLET (60 MG TOTAL) BY MOUTH 3 (THREE) TIMES DAILY.  Marland Kitchen sertraline (ZOLOFT) 100 MG tablet TAKE 1 TABLET (100 MG TOTAL) BY MOUTH DAILY.  Marland Kitchen topiramate (TOPAMAX) 50 MG tablet TAKE 1 TABLET BY MOUTH TWICE DAILY   No facility-administered encounter medications on file as of 01/02/2021.   Allergies  Allergen Reactions  . Sulfa Antibiotics Itching   Patient Active Problem List   Diagnosis Date Noted  . Hx of adenomatous colonic polyps  01/02/2021  . Pulmonary arterial hypertension ([place]) 07/04/2020  . Kidney cysts 07/04/2020  . Hyperlipidemia 07/04/2020  . Depression 07/04/2020  . Iron deficiency anemia 10/13/2018  . DOE (dyspnea on exertion) 03/23/2018  . Obesity, morbid, BMI 40.0-49.9 ([place]) 03/23/2018  . Chronic diastolic heart failure ([place]) 03/23/2018  . Grade I diastolic dysfunction 04/54/0981  . Migraine without aura and without status migrainosus, not intractable 03/02/2017  . Class 3 severe obesity due to excess calories with serious comorbidity and body mass index (BMI) of 40.0 to 44.9 in adult ([place]) 01/13/2017  . Hiatal hernia 10/28/2016  . Pure hypercholesterolemia 10/28/2016  . Chronic kidney disease 06/01/2015  . Gastroesophageal reflux disease 06/01/2015  . Goiter 06/01/2015  . Essential hypertension 06/01/2015  . Chronic pain syndrome 05/28/2015  . Fibromyalgia 05/28/2015  . OSA (obstructive sleep apnea) 05/28/2015  . Adjustment disorder with mixed anxiety and depressed mood 05/28/2015   Social History   Socioeconomic History  . Marital status: Married    Spouse name: Not on file  . Number of children: 3  . Years of education: Not on file  . Highest education level: Not on file  Occupational History  . Occupation: unemployed  Tobacco Use  . Smoking status: Former Smoker    Quit date: 08/04/1998    Years since quitting: 22.4  . Smokeless tobacco: Never Used  Vaping Use  . Vaping Use: Never used  Substance and Sexual Activity  . Alcohol use: No  . Drug use: No  . Sexual activity: Yes    Birth control/protection: Surgical, Post-menopausal  Other Topics Concern  . Not on file  Social History Narrative   Marital status: married x 20 years; moderately happy     Children: 3 children (47 [name], 22 [name], 40 [name]); 8 grandchildren; 0 gg     Lives: with husband/[name], [name], 2 granddaughters     Employment:  Unemployed; quit working 2015 with work related injury; awaiting disability in 2018;  disability approved in 02/2017.       Tobacco: quit smoking in 2016; smoked x 2 years.      Alcohol: none      Drugs: none      Exercise: rarely in 2018         Social Determinants of Health   Financial Resource Strain: Not on file  Food Insecurity: Not on file  Transportation Needs: Not on file  Physical Activity: Not on file  Stress: Not on file  Social Connections: Not on file  Intimate Partner Violence: Not on file    Ms. Sample's family history includes Arthritis in her sister; Cancer in her brother, brother, maternal grandfather, maternal grandmother, and sister; Cancer (age of onset: 54) in her brother; Cancer (age of onset: 63) in her father; Depression in her brother, mother, sister, and sister; Gout in her brother and mother; Heart defect in her paternal grandfather; Heart disease in her paternal grandmother; Heart disease (age of onset: 57) in her father; Heart disease (age of  onset: 61) in her mother; Hyperlipidemia in her brother, brother, brother, father, and mother; Hypertension in her brother, brother, brother, brother, brother, father, and mother; Hypothyroidism in her mother; Melanoma in her brother and brother; Prostate cancer in her father; Stroke (age of onset: 76) in her father.      Objective:    Vitals:   01/02/21 1107  BP: 110/80  Pulse: 63    Physical Exam Well-developed well-nourished older white female in no acute distress.  Height, Weight, 244 BMI 41.8  HEENT; nontraumatic normocephalic, EOMI, PE R LA, sclera anicteric. Oropharynx; not examined today Neck; supple, no JVD Cardiovascular; regular rate and rhythm with S1-S2, no murmur rub or gallop Pulmonary; Clear bilaterally Abdomen; soft, there is some mild tenderness in the right mid quadrant,, nondistended, no palpable mass or hepatosplenomegaly, bowel sounds are active Rectal; not done Skin; benign exam, no jaundice rash or appreciable lesions Extremities; no clubbing cyanosis or edema skin  warm and dry Neuro/Psych; alert and oriented x4, grossly nonfocal mood and affect appropriate       Assessment & Plan:   #28 65 year old white female with recent episode of bright red blood per rectum with blood noted mixed in with a normal bowel movement, patient relates significant amount of blood with no associated anal rectal discomfort or abdominal discomfort.  Etiology of bleeding is not clear, no internal hemorrhoids documented on colonoscopy 2020.  Rule out occult neoplasm, rule out development of internal hemorrhoids, rule out other inflammatory process. Patient does have history of tubular adenomatous polyps and a sessile serrated polyp with high-grade dysplasia in the rectum found at colonoscopy May 2020. She had 2 subsequent sigmoidoscopies, June 2020 and December 2020.  At most recent exam there was no residual adenomatous mucosa and she was indicated for follow-up colonoscopy May 2023.  #2 history of iron deficiency, on chronic iron #3 chronic kidney disease 4.  Fibromyalgia 5.  GERD-stable on Dexilant 6.  History of pulmonary artery hypertension, mild CHF #7 hypertension ; Plan; Patient will be scheduled for follow-up colonoscopy with Dr. Ardis Hughs.  Procedure was discussed in detail with the patient including indications risks and benefits and she is agreeable to proceed.    Iniya Matzek S Arlanda Shiplett PA-C 01/02/2021   Cc: [place], [place]

## 2021-01-02 NOTE — Patient Instructions (Signed)
You have been scheduled for a colonoscopy. Please follow written instructions given to you at your visit today.  Please pick up your prep supplies at the pharmacy within the next 1-3 days. If you use inhalers (even only as needed), please bring them with you on the day of your procedure.  If you are age 65 or younger, your body mass index should be between 19-25. Your Body mass index is 41.88 kg/m. If this is out of the aformentioned range listed, please consider follow up with your Primary Care Provider.  _______________________________________________________  The  GI providers would like to encourage you to use [facility] to communicate with providers for non-urgent requests or questions.  Due to long hold times on the telephone, sending your provider a message by [facility] may be a faster and more efficient way to get a response.  Please allow 48 business hours for a response.  Please remember that this is for non-urgent requests.   Due to recent changes in healthcare laws, you may see the results of your imaging and laboratory studies on MyChart before your provider has had a chance to review them.  We understand that in some cases there may be results that are confusing or concerning to you. Not all laboratory results come back in the same time frame and the provider may be waiting for multiple results in order to interpret others.  Please give [place] 48 hours in order for your provider to thoroughly review all the results before contacting the office for clarification of your results.

## 2021-01-03 ENCOUNTER — Encounter: Payer: Self-pay | Admitting: Oncology

## 2021-01-03 NOTE — Progress Notes (Signed)
I agree with the above note, plan 

## 2021-01-04 ENCOUNTER — Encounter: Payer: Self-pay | Admitting: Gastroenterology

## 2021-01-04 ENCOUNTER — Other Ambulatory Visit: Payer: Self-pay

## 2021-01-04 ENCOUNTER — Ambulatory Visit (AMBULATORY_SURGERY_CENTER): Payer: 59 | Admitting: Gastroenterology

## 2021-01-04 VITALS — BP 126/68 | HR 73 | Temp 97.8°F | Resp 16 | Ht 64.0 in | Wt 244.0 lb

## 2021-01-04 DIAGNOSIS — K5730 Diverticulosis of large intestine without perforation or abscess without bleeding: Secondary | ICD-10-CM | POA: Diagnosis not present

## 2021-01-04 DIAGNOSIS — G4733 Obstructive sleep apnea (adult) (pediatric): Secondary | ICD-10-CM | POA: Diagnosis not present

## 2021-01-04 DIAGNOSIS — K625 Hemorrhage of anus and rectum: Secondary | ICD-10-CM | POA: Diagnosis not present

## 2021-01-04 DIAGNOSIS — D124 Benign neoplasm of descending colon: Secondary | ICD-10-CM | POA: Diagnosis not present

## 2021-01-04 DIAGNOSIS — I10 Essential (primary) hypertension: Secondary | ICD-10-CM | POA: Diagnosis not present

## 2021-01-04 DIAGNOSIS — F411 Generalized anxiety disorder: Secondary | ICD-10-CM | POA: Diagnosis not present

## 2021-01-04 DIAGNOSIS — Z86010 Personal history of colonic polyps: Secondary | ICD-10-CM | POA: Diagnosis not present

## 2021-01-04 DIAGNOSIS — M797 Fibromyalgia: Secondary | ICD-10-CM | POA: Diagnosis not present

## 2021-01-04 MED ORDER — SODIUM CHLORIDE 0.9 % IV SOLN: 500.0000 mL | Freq: Once | INTRAVENOUS | Status: DC

## 2021-01-04 NOTE — Patient Instructions (Signed)
YOU HAD AN ENDOSCOPIC PROCEDURE TODAY AT THE [facility]:   Refer to the procedure report that was given to you for any specific questions about what was found during the examination.  If the procedure report does not answer your questions, please call your gastroenterologist to clarify.  If you requested that your care partner not be given the details of your procedure findings, then the procedure report has been included in a sealed envelope for you to review at your convenience later.  YOU SHOULD EXPECT: Some feelings of bloating in the abdomen. Passage of more gas than usual.  Walking can help get rid of the air that was put into your GI tract during the procedure and reduce the bloating. If you had a lower endoscopy (such as a colonoscopy or flexible sigmoidoscopy) you may notice spotting of blood in your stool or on the toilet paper. If you underwent a bowel prep for your procedure, you may not have a normal bowel movement for a few days.  Please Note:  You might notice some irritation and congestion in your nose or some drainage.  This is from the oxygen used during your procedure.  There is no need for concern and it should clear up in a day or so.  SYMPTOMS TO REPORT IMMEDIATELY:   Following lower endoscopy (colonoscopy or flexible sigmoidoscopy):  Excessive amounts of blood in the stool  Significant tenderness or worsening of abdominal pains  Swelling of the abdomen that is new, acute  Fever of 100F or higher   Following upper endoscopy (EGD)  Vomiting of blood or coffee ground material  New chest pain or pain under the shoulder blades  Painful or persistently difficult swallowing  New shortness of breath  Fever of 100F or higher  Black, tarry-looking stools  For urgent or emergent issues, a gastroenterologist can be reached at any hour by calling [phone]. Do not use MyChart messaging for urgent concerns.    DIET:  We do recommend a small meal at first, but  then you may proceed to your regular diet.  Drink plenty of fluids but you should avoid alcoholic beverages for 24 hours.  ACTIVITY:  You should plan to take it easy for the rest of today and you should NOT DRIVE or use heavy machinery until tomorrow (because of the sedation medicines used during the test).    FOLLOW UP: Our staff will call the number listed on your records 48-72 hours following your procedure to check on you and address any questions or concerns that you may have regarding the information given to you following your procedure. If we do not reach you, we will leave a message.  We will attempt to reach you two times.  During this call, we will ask if you have developed any symptoms of COVID 19. If you develop any symptoms (ie: fever, flu-like symptoms, shortness of breath, cough etc.) before then, please call [phone].  If you test positive for Covid 19 in the 2 weeks post procedure, please call and report this information to us.    If any biopsies were taken you will be contacted by phone or by letter within the next 1-3 weeks.  Please call us at [phone] if you have not heard about the biopsies in 3 weeks.    SIGNATURES/CONFIDENTIALITY: You and/or your care partner have signed paperwork which will be entered into your electronic medical record.  These signatures attest to the fact that that the information above on   your After Visit Summary has been reviewed and is understood.  Full responsibility of the confidentiality of this discharge information lies with you and/or your care-partner. 

## 2021-01-04 NOTE — Op Note (Signed)
[place] Patient Name: [name] Procedure Date: 01/04/2021 10:45 AM MRN: [id number] Endoscopist: Milus Banister , MD Age: 65 Referring MD:  Date of Birth: [birth year] Gender: Female Account #: [id number] Procedure:                Colonoscopy Indications:              High risk colon cancer surveillance: Personal                            history of colonic polyps; Colonoscopy 12/2018 1.6cm                            semi pedunculated polyp removed from rectum with                            snare/cautery, fragmented sample showed TA with HGD                            at cautery margin. Flex sigmoidoscopy 01/2019 site                            removed with snare/cautery, edges biopsied, site                            tattoo'd, all path showed no residual adenoma. Flex                            sig 07/2019 site located, biopsies of                            non-neoplastic appearing scar tissue were normal,                            no residual adenoma. Recent rectal bleeding. Medicines:                Monitored Anesthesia Care Procedure:                Pre-Anesthesia Assessment:                           - Prior to the procedure, a History and Physical                            was performed, and patient medications and                            allergies were reviewed. The patient's tolerance of                            previous anesthesia was also reviewed. The risks                            and benefits of the procedure and the sedation  options and risks were discussed with the patient.                            All questions were answered, and informed consent                            was obtained. Prior Anticoagulants: The patient has                            taken no previous anticoagulant or antiplatelet                            agents. ASA Grade Assessment: III - A patient with                            severe  systemic disease. After reviewing the risks                            and benefits, the patient was deemed in                            satisfactory condition to undergo the procedure.                           After obtaining informed consent, the colonoscope                            was passed under direct vision. Throughout the                            procedure, the patient's blood pressure, pulse, and                            oxygen saturations were monitored continuously. The                            Olympus CF-HQ190 [id number]) Colonoscope was                            introduced through the anus and advanced to the the                            cecum, identified by appendiceal orifice and                            ileocecal valve. The colonoscopy was performed                            without difficulty. The patient tolerated the                            procedure well. The quality of the bowel  preparation was good. The ileocecal valve,                            appendiceal orifice, and rectum were photographed. Scope In: 10:49:18 AM Scope Out: 11:02:13 AM Scope Withdrawal Time: 0 hours 9 minutes 14 seconds  Total Procedure Duration: 0 hours 12 minutes 55 seconds  Findings:                 A 7 mm polyp was found in the descending colon. The                            polyp was sessile. The polyp was removed with a                            cold snare. Resection and retrieval were complete.                           A few small and large-mouthed diverticula were                            found in the left colon.                           The site of 2020 rectal polypectomy was easily                            located. No sign of recurrent neoplastic mucosa.                           The exam was otherwise without abnormality on                            direct and retroflexion views. Complications:            No immediate  complications. Estimated blood loss:                            None. Estimated Blood Loss:     Estimated blood loss: none. Impression:               - One 7 mm polyp in the descending colon, removed                            with a cold snare. Resected and retrieved.                           - Diverticulosis in the left colon.                           - The site of 2020 rectal polypectomy was easily                            located. No sign of recurrent neoplastic mucosa.                           -  The examination was otherwise normal on direct                            and retroflexion views.                           The minor rectal bleeding was most likely from                            hemorrhoids which can come and go, currently no                            hemorrhoids are present. Recommendation:           - Patient has a contact number available for                            emergencies. The signs and symptoms of potential                            delayed complications were discussed with the                            patient. Return to normal activities tomorrow.                            Written discharge instructions were provided to the                            patient.                           - Resume previous diet.                           - Continue present medications.                           - Await pathology results. Milus Banister, MD 01/04/2021 11:07:59 AM This report has been signed electronically.

## 2021-01-04 NOTE — Progress Notes (Signed)
Medical history reviewed with no changes noted. VS assessed by C.W 

## 2021-01-04 NOTE — Progress Notes (Signed)
Report to PACU, RN, vss, BBS= Clear.  

## 2021-01-08 ENCOUNTER — Telehealth: Payer: Self-pay | Admitting: *Deleted

## 2021-01-08 NOTE — Telephone Encounter (Signed)
  Follow up Call-  Call back number 01/04/2021 07/25/2019 01/04/2019 12/21/2018  Post procedure Call Back phone  # [phone]  Permission to leave phone message Yes Yes Yes Yes  Some recent data might be hidden     Patient questions:  Do you have a fever, pain , or abdominal swelling? No. Pain Score  0 *  Have you tolerated food without any problems? Yes.    Have you been able to return to your normal activities? Yes.    Do you have any questions about your discharge instructions: Diet   No. Medications  No. Follow up visit  No.  Do you have questions or concerns about your Care? No.  Actions: * If pain score is 4 or above: No action needed, pain <4.  1. Have you developed a fever since your procedure? no  2.   Have you had an respiratory symptoms (SOB or cough) since your procedure? no  3.   Have you tested positive for COVID 19 since your procedure no  4.   Have you had any family members/close contacts diagnosed with the COVID 19 since your procedure?  no   If yes to any of these questions please route to Joylene John, RN and Joella Prince, RN

## 2021-01-09 ENCOUNTER — Encounter: Payer: Self-pay | Admitting: Gastroenterology

## 2021-01-11 ENCOUNTER — Other Ambulatory Visit (HOSPITAL_COMMUNITY): Payer: Self-pay

## 2021-01-11 ENCOUNTER — Telehealth: Payer: 59 | Admitting: Physician Assistant

## 2021-01-11 DIAGNOSIS — B001 Herpesviral vesicular dermatitis: Secondary | ICD-10-CM

## 2021-01-11 MED ORDER — VALACYCLOVIR HCL 1 G PO TABS: ORAL_TABLET | ORAL | 0 refills | Status: DC

## 2021-01-11 MED ORDER — VALACYCLOVIR HCL 1 G PO TABS
ORAL_TABLET | ORAL | 0 refills | Status: DC
  Filled 2021-01-11 (×2): qty 20, 10d supply, fill #0

## 2021-01-11 NOTE — Progress Notes (Signed)
We are sorry that you are not feeling well.  Here is how we plan to help!  Based on what you have shared with me it does look like you have a viral infection.    Most cold sores or fever blisters are small fluid filled blisters around the mouth caused by herpes simplex virus.  The most common strain of the virus causing cold sores is herpes simplex virus 1.  It can be spread by skin contact, sharing eating utensils, or even sharing towels.  Cold sores are contagious to other people until dry. (Approximately 5-7 days).  Wash your hands. You can spread the virus to your eyes through handling your contact lenses after touching the lesions.  Most people experience pain at the sight or tingling sensations in their lips that may begin before the ulcers erupt.  Herpes simplex is treatable but not curable.  It may lie dormant for a long time and then reappear due to stress or prolonged sun exposure.  Many patients have success in treating their cold sores with an over the counter topical called Abreva.  You may apply the cream up to 5 times daily (maximum 10 days) until healing occurs.  If you would like to use an oral antiviral medication to speed the healing of your cold sore, I have sent a prescription to your local pharmacy Valacyclovir 1gram Take 2 tablets initially, then 1 tablet every 12 hours until resolved.    HOME CARE:  Wash your hands frequently. Do not pick at or rub the sore. Don't open the blisters. Avoid kissing other people during this time. Avoid sharing drinking glasses, eating utensils, or razors. Do not handle contact lenses unless you have thoroughly washed your hands with soap and warm water! Avoid oral sex during this time.  Herpes from sores on your mouth can spread to your partner's genital area. Avoid contact with anyone who has eczema or a weakened immune system. Cold sores are often triggered by exposure to intense sunlight, use a lip balm containing a sunscreen (SPF 30 or  higher).  GET HELP RIGHT AWAY IF:  Blisters look infected. Blisters occur near or in the eye. Symptoms last longer than 10 days. Your symptoms become worse.  MAKE SURE YOU:  Understand these instructions. Will watch your condition. Will get help right away if you are not doing well or get worse.    Your e-visit answers were reviewed by a board certified advanced clinical practitioner to complete your personal care plan.  Depending upon the condition, your plan could have  Included both over the counter or prescription medications.    Please review your pharmacy choice.  Be sure that the pharmacy you have chosen is open so that you can pick up your prescription now.  If there is a problem you can message your provider in [place] to have the prescription routed to another pharmacy.    Your safety is important to [place].  If you have drug allergies check our prescription carefully.  For the next 24 hours you can use MyChart to ask questions about today's visit, request a non-urgent call back, or ask for a work or school excuse from your e-visit provider.  You will get an email in the next two days asking about your experience.  I hope that your e-visit has been valuable and will speed your recovery.  I provided 5 minutes of non face-to-face time during this encounter for chart review and documentation.

## 2021-01-11 NOTE — Addendum Note (Signed)
Addended by: Mar Daring on: 01/11/2021 02:16 PM   Modules accepted: Orders

## 2021-01-17 ENCOUNTER — Ambulatory Visit: Payer: 59 | Attending: Critical Care Medicine

## 2021-01-17 DIAGNOSIS — Z20822 Contact with and (suspected) exposure to covid-19: Secondary | ICD-10-CM

## 2021-01-18 ENCOUNTER — Telehealth: Payer: 59 | Admitting: Emergency Medicine

## 2021-01-18 DIAGNOSIS — U071 COVID-19: Secondary | ICD-10-CM | POA: Diagnosis not present

## 2021-01-18 LAB — NOVEL CORONAVIRUS, NAA: SARS-CoV-2, NAA: DETECTED — AB

## 2021-01-18 LAB — SARS-COV-2, NAA 2 DAY TAT

## 2021-01-18 MED ORDER — GUAIFENESIN 100 MG/5ML PO SOLN: 5.0000 mL | ORAL | 0 refills | Status: DC | PRN

## 2021-01-18 NOTE — Patient Instructions (Signed)
    You have tested positive for COVID-19, meaning that you were infected with the novel coronavirus and could give the virus to others.  It is vitally important that you stay home so you do not spread it to others.      Please continue isolation at home, for at least 10 days since the start of your symptoms and until you have had 24 hours with no fever (without taking a fever reducer) and with improving of symptoms.  If you have no symptoms but tested positive (or all symptoms resolve after 5 days and you have no fever) you can leave your house but continue to wear a mask around others for an additional 5 days. If you have a fever,continue to stay home until you have had 24 hours of no fever. Most cases improve 5-10 days from onset but we have seen a small number of patients who have gotten worse after the 10 days.  Please be sure to watch for worsening symptoms and remain taking the proper precautions.   Go to the nearest hospital ED for assessment if fever/cough/breathlessness are severe or illness seems like a threat to life.    The following symptoms may appear 2-14 days after exposure: Fever Cough Shortness of breath or difficulty breathing Chills Repeated shaking with chills Muscle pain Headache Sore throat New loss of taste or smell Fatigue Congestion or runny nose Nausea or vomiting Diarrhea   You may also take acetaminophen (Tylenol) as needed for fever.  HOME CARE: Only take medications as instructed by your medical team. Drink plenty of fluids and get plenty of rest. A steam or ultrasonic humidifier can help if you have congestion.   GET HELP RIGHT AWAY IF YOU HAVE EMERGENCY WARNING SIGNS.  Call 911 or proceed to your closest emergency facility if: You develop worsening high fever. Trouble breathing Bluish lips or face Persistent pain or pressure in the chest New confusion Inability to wake or stay awake You cough up blood. Your symptoms become more  severe Inability to hold down food or fluids  This list is not all possible symptoms. Contact your medical provider for any symptoms that are severe or concerning to you.

## 2021-01-18 NOTE — Progress Notes (Signed)
Virtual Visit Consent   [name], you are scheduled for a virtual visit with a [facility] provider today.     Just as with appointments in the office, your consent must be obtained to participate.  Your consent will be active for this visit and any virtual visit you may have with one of our providers in the next 365 days.     If you have a MyChart account, a copy of this consent can be sent to you electronically.  All virtual visits are billed to your insurance company just like a traditional visit in the office.    As this is a virtual visit, video technology does not allow for your provider to perform a traditional examination.  This may limit your provider's ability to fully assess your condition.  If your provider identifies any concerns that need to be evaluated in person or the need to arrange testing (such as labs, EKG, etc.), we will make arrangements to do so.     Although advances in technology are sophisticated, we cannot ensure that it will always work on either your end or our end.  If the connection with a video visit is poor, the visit may have to be switched to a telephone visit.  With either a video or telephone visit, we are not always able to ensure that we have a secure connection.     I need to obtain your verbal consent now.   Are you willing to proceed with your visit today?    [name] has provided verbal consent on 01/18/2021 for a virtual visit telephone.   Montine Circle, PA-C   Date: 01/18/2021 10:28 AM   Virtual Visit via Video Note   I, Montine Circle, connected [id number]@ ([id number], 1956/07/25) on 01/18/21 at 10:30 AM EDT by telephone and verified that I am speaking with the correct person using two identifiers.  Location: Patient: Virtual Visit Location Patient: Home Provider: Virtual Visit Location Provider: Home Office   I discussed the limitations of evaluation and management by telemedicine and the availability of in person  appointments. The patient expressed understanding and agreed to proceed.    History of Present Illness: [name] is a 65 y.o. who identifies as a female who was assigned female at birth, and is being seen today for COVID 3.  Has bee feeling sick since last weekend >5 days.  Has been feeling achy.  Has had cough.  Has been having loss of voice.  Has been trying ibuprofen with mild relief. She is vaccinated and boosted against COVID.  State she has mild SOB, but doesn't think it's bad enough to be seen.  HPI: HPI  Problems:  Patient Active Problem List   Diagnosis Date Noted   Hx of adenomatous colonic polyps 01/02/2021   Pulmonary arterial hypertension ([place]) 07/04/2020   Kidney cysts 07/04/2020   Hyperlipidemia 07/04/2020   Depression 07/04/2020   Iron deficiency anemia 10/13/2018   DOE (dyspnea on exertion) 03/23/2018   Obesity, morbid, BMI 40.0-49.9 ([place]) 03/23/2018   Chronic diastolic heart failure ([place]) 03/23/2018   Grade I diastolic dysfunction 99/24/2683   Migraine without aura and without status migrainosus, not intractable 03/02/2017   Class 3 severe obesity due to excess calories with serious comorbidity and body mass index (BMI) of 40.0 to 44.9 in adult [facility]) 01/13/2017   Hiatal hernia 10/28/2016   Pure hypercholesterolemia 10/28/2016   Chronic kidney disease 06/01/2015   Gastroesophageal reflux disease 06/01/2015   Goiter 06/01/2015  Essential hypertension 06/01/2015   Chronic pain syndrome 05/28/2015   Fibromyalgia 05/28/2015   OSA (obstructive sleep apnea) 05/28/2015   Adjustment disorder with mixed anxiety and depressed mood 05/28/2015    Allergies:  Allergies  Allergen Reactions   Sulfa Antibiotics Itching   Medications:  Current Outpatient Medications:    albuterol (PROAIR HFA) 108 (90 Base) MCG/ACT inhaler, Inhale 1-2 puffs into the lungs every 4 (four) hours as needed for wheezing or shortness of breath. (Patient not taking: Reported on 01/04/2021),  Disp: 18 g, Rfl: 3   atorvastatin (LIPITOR) 10 MG tablet, TAKE 1 TABLET (10 MG TOTAL) BY MOUTH DAILY., Disp: 90 tablet, Rfl: 1   busPIRone (BUSPAR) 7.5 MG tablet, Take 1 tablet (7.5 mg total) by mouth 2 (two) times daily., Disp: 180 tablet, Rfl: 1   butalbital-acetaminophen-caffeine (FIORICET) 50-325-40 MG tablet, Take 1 tablet by mouth every 6 (six) hours as needed for headache., Disp: 20 tablet, Rfl: 1   cyclobenzaprine (FLEXERIL) 10 MG tablet, Take 1 tablet (10 mg total) by mouth 3 (three) times daily as needed for muscle spasms., Disp: 180 tablet, Rfl: 1   dexlansoprazole (DEXILANT) 60 MG capsule, TAKE 1 CAPSULE (60 MG TOTAL) BY MOUTH DAILY., Disp: 90 capsule, Rfl: 1   DULoxetine (CYMBALTA) 60 MG capsule, TAKE 1 CAPSULE (60 MG TOTAL) BY MOUTH DAILY., Disp: 90 capsule, Rfl: 1   fluticasone (FLONASE) 50 MCG/ACT nasal spray, Place 1 spray into both nostrils daily., Disp: 16 g, Rfl: 6   furosemide (LASIX) 20 MG tablet, Take 1 tablet (20 mg total) by mouth 2 (two) times daily as needed. (Patient not taking: Reported on 01/04/2021), Disp: 135 tablet, Rfl: 4   gabapentin (NEURONTIN) 300 MG capsule, TAKE 1 TO 2 CAPSULES BY MOUTH 4 TIMES A DAY, Disp: 540 capsule, Rfl: 1   lisinopril (ZESTRIL) 20 MG tablet, TAKE 1 TABLET (20 MG TOTAL) BY MOUTH DAILY., Disp: 90 tablet, Rfl: 1   ondansetron (ZOFRAN-ODT) 4 MG disintegrating tablet, Take 1 tablet (4 mg total) by mouth every 8 (eight) hours as needed for nausea or vomiting., Disp: 20 tablet, Rfl: 4   propranolol (INDERAL) 60 MG tablet, TAKE 1 TABLET (60 MG TOTAL) BY MOUTH 3 (THREE) TIMES DAILY., Disp: 270 tablet, Rfl: 3   sertraline (ZOLOFT) 100 MG tablet, TAKE 1 TABLET (100 MG TOTAL) BY MOUTH DAILY., Disp: 90 tablet, Rfl: 1   topiramate (TOPAMAX) 50 MG tablet, TAKE 1 TABLET BY MOUTH TWICE DAILY, Disp: 180 tablet, Rfl: 1   valACYclovir (VALTREX) 1000 MG tablet, Take 2 tablets (2000 mg) initally, then 1 tablet (1000 mg) every 12 hours until resolved, no more than  10 days, Disp: 20 tablet, Rfl: 0  Observations/Objective: Patient is well-developed, well-nourished in no acute distress.  Resting comfortably at home.  Head is normocephalic, atraumatic.  No labored breathing. Sounds a bit hoarse. Speech is clear and coherent with logical content.  Patient is alert and oriented at baseline.    Assessment and Plan: 1. COVID-19 -Outside of treatment window for antivirals -Robitussin for cough -F/u if worsening  Follow Up Instructions: I discussed the assessment and treatment plan with the patient. The patient was provided an opportunity to ask questions and all were answered. The patient agreed with the plan and demonstrated an understanding of the instructions.  A copy of instructions were sent to the patient via MyChart.  The patient was advised to call back or seek an in-person evaluation if the symptoms worsen or if the condition fails to improve as  anticipated.  Time:  I spent 18 minutes with the patient via telehealth technology discussing the above problems/concerns.    Montine Circle, PA-C

## 2021-01-29 ENCOUNTER — Other Ambulatory Visit: Payer: Self-pay | Admitting: Oncology

## 2021-01-29 DIAGNOSIS — Z1231 Encounter for screening mammogram for malignant neoplasm of breast: Secondary | ICD-10-CM

## 2021-02-05 ENCOUNTER — Other Ambulatory Visit (HOSPITAL_COMMUNITY): Payer: Self-pay

## 2021-02-05 ENCOUNTER — Other Ambulatory Visit: Payer: Self-pay | Admitting: Emergency Medicine

## 2021-02-05 DIAGNOSIS — M797 Fibromyalgia: Secondary | ICD-10-CM

## 2021-02-05 DIAGNOSIS — K219 Gastro-esophageal reflux disease without esophagitis: Secondary | ICD-10-CM

## 2021-02-05 DIAGNOSIS — F32A Depression, unspecified: Secondary | ICD-10-CM

## 2021-02-05 MED ORDER — DEXLANSOPRAZOLE 60 MG PO CPDR
1.0000 | DELAYED_RELEASE_CAPSULE | Freq: Every day | ORAL | 1 refills | Status: DC
  Filled 2021-02-05: qty 90, 90d supply, fill #0
  Filled 2021-05-05: qty 90, 90d supply, fill #1

## 2021-02-05 MED ORDER — DULOXETINE HCL 60 MG PO CPEP
60.0000 mg | ORAL_CAPSULE | Freq: Every day | ORAL | 1 refills | Status: DC
  Filled 2021-02-05: qty 90, 90d supply, fill #0
  Filled 2021-05-05: qty 90, 90d supply, fill #1

## 2021-02-05 MED ORDER — SERTRALINE HCL 100 MG PO TABS
100.0000 mg | ORAL_TABLET | Freq: Every day | ORAL | 1 refills | Status: DC
  Filled 2021-02-05: qty 90, 90d supply, fill #0
  Filled 2021-05-05: qty 90, 90d supply, fill #1

## 2021-02-05 MED FILL — Topiramate Tab 50 MG: ORAL | 90 days supply | Qty: 180 | Fill #0 | Status: AC

## 2021-02-18 ENCOUNTER — Encounter: Payer: Self-pay | Admitting: Oncology

## 2021-02-20 ENCOUNTER — Other Ambulatory Visit: Payer: Self-pay

## 2021-02-20 ENCOUNTER — Encounter: Payer: Self-pay | Admitting: Emergency Medicine

## 2021-02-20 ENCOUNTER — Other Ambulatory Visit (HOSPITAL_COMMUNITY): Payer: Self-pay

## 2021-02-20 ENCOUNTER — Encounter: Payer: Self-pay | Admitting: Oncology

## 2021-02-20 ENCOUNTER — Ambulatory Visit: Payer: 59 | Admitting: Emergency Medicine

## 2021-02-20 VITALS — BP 130/80 | HR 75 | Temp 98.3°F | Resp 18 | Ht 64.0 in | Wt 242.4 lb

## 2021-02-20 DIAGNOSIS — E785 Hyperlipidemia, unspecified: Secondary | ICD-10-CM

## 2021-02-20 DIAGNOSIS — J0100 Acute maxillary sinusitis, unspecified: Secondary | ICD-10-CM

## 2021-02-20 DIAGNOSIS — R0981 Nasal congestion: Secondary | ICD-10-CM

## 2021-02-20 DIAGNOSIS — M797 Fibromyalgia: Secondary | ICD-10-CM | POA: Diagnosis not present

## 2021-02-20 DIAGNOSIS — I10 Essential (primary) hypertension: Secondary | ICD-10-CM

## 2021-02-20 DIAGNOSIS — H9201 Otalgia, right ear: Secondary | ICD-10-CM

## 2021-02-20 DIAGNOSIS — M792 Neuralgia and neuritis, unspecified: Secondary | ICD-10-CM | POA: Diagnosis not present

## 2021-02-20 MED ORDER — AMOXICILLIN-POT CLAVULANATE 875-125 MG PO TABS
1.0000 | ORAL_TABLET | Freq: Two times a day (BID) | ORAL | 0 refills | Status: AC
Start: 2021-02-20 — End: 2021-02-27
  Filled 2021-02-20: qty 14, 7d supply, fill #0

## 2021-02-20 MED ORDER — LISINOPRIL 20 MG PO TABS
20.0000 mg | ORAL_TABLET | Freq: Every day | ORAL | 1 refills | Status: DC
  Filled 2021-02-20: qty 90, 90d supply, fill #0
  Filled 2021-06-05: qty 90, 90d supply, fill #1

## 2021-02-20 MED ORDER — GABAPENTIN 300 MG PO CAPS
300.0000 mg | ORAL_CAPSULE | Freq: Four times a day (QID) | ORAL | 1 refills | Status: DC
  Filled 2021-02-20: qty 540, 68d supply, fill #0
  Filled 2021-07-08: qty 540, 68d supply, fill #1

## 2021-02-20 MED ORDER — ATORVASTATIN CALCIUM 10 MG PO TABS
10.0000 mg | ORAL_TABLET | Freq: Every day | ORAL | 1 refills | Status: DC
  Filled 2021-02-20: qty 90, 90d supply, fill #0

## 2021-02-20 NOTE — Patient Instructions (Signed)
Earache, Adult An earache, or ear pain, can be caused by many things, including: An infection. Ear wax buildup. Ear pressure. Something in the ear that should not be there (foreign body). A sore throat. Tooth problems. Jaw problems. Treatment of the earache will depend on the cause. If the cause is not clear or cannot be determined, you may need to watch your symptoms until your earache goes away or until a cause is found. Follow these instructions at home: Medicines Take or apply over-the-counter and prescription medicines only as told by your health care provider. If you were prescribed an antibiotic medicine, use it as told by your health care provider. Do not stop using the antibiotic even if you start to feel better. Do not put anything in your ear other than medicine that is prescribed by your health care provider. Managing pain If directed, apply heat to the affected area as often as told by your health care provider. Use the heat source that your health care provider recommends, such as a moist heat pack or a heating pad. Place a towel between your skin and the heat source. Leave the heat on for 20-30 minutes. Remove the heat if your skin turns bright red. This is especially important if you are unable to feel pain, heat, or cold. You may have a greater risk of getting burned. If directed, put ice on the affected area as often as told by your health care provider. To do this:   Put ice in a plastic bag. Place a towel between your skin and the bag. Leave the ice on for 20 minutes, 2-3 times a day. General instructions Pay attention to any changes in your symptoms. Try resting in an upright position instead of lying down. This may help to reduce pressure in your ear and relieve pain. Chew gum if it helps to relieve your ear pain. Treat any allergies as told by your health care provider. Drink enough fluid to keep your urine pale yellow. It is up to you to get the results of any  tests that were done. Ask your health care provider, or the department that is doing the tests, when your results will be ready. Keep all follow-up visits as told by your health care provider. This is important. Contact a health care provider if: Your pain does not improve within 2 days. Your earache gets worse. You have new symptoms. You have a fever. Get help right away if you: Have a severe headache. Have a stiff neck. Have trouble swallowing. Have redness or swelling behind your ear. Have fluid or blood coming from your ear. Have hearing loss. Feel dizzy. Summary An earache, or ear pain, can be caused by many things. Treatment of the earache will depend on the cause. Follow recommendations from your health care provider to treat your ear pain. If the cause is not clear or cannot be determined, you may need to watch your symptoms until your earache goes away or until a cause is found. Keep all follow-up visits as told by your health care provider. This is important. This information is not intended to replace advice given to you by your health care provider. Make sure you discuss any questions you have with your health care provider. Document Revised: 02/26/2019 Document Reviewed: 02/26/2019 Elsevier Patient Education  2022 [employer]  

## 2021-02-20 NOTE — Progress Notes (Signed)
[name] 65 y.o.   Chief Complaint  Patient presents with   Dizziness    Denies passing out. Started a week ago.    Right Ear Conern    Feels like she has water behind her eardrum. She hears some buzzing sound in her ear. Muffled hearing.     HISTORY OF PRESENT ILLNESS: This is a 65 y.o. female complaining of 1 week history of right ear symptoms, like "water in my ear", "humming sound in my right ear", slightly dizzy, sinus drainage, laryngitis and hoarseness.  Had COVID infection last June but recovered well. Denies fever or chills.  Has mild headache.  Denies difficulty breathing or chest pain.  Slightly nauseous but no vomiting.  Denies abdominal pain or diarrhea.  Symptoms progressively getting worse. No other complaints or medical concerns today  Dizziness Associated symptoms include congestion and nausea. Pertinent negatives include no abdominal pain, chest pain, chills, coughing, fever, rash, sore throat or vomiting.    Prior to Admission medications   Medication Sig Start Date End Date Taking? Authorizing Provider  albuterol (PROAIR HFA) 108 (90 Base) MCG/ACT inhaler Inhale 1-2 puffs into the lungs every 4 (four) hours as needed for wheezing or shortness of breath. Patient not taking: No sig reported 12/25/20   Horald Pollen, MD  atorvastatin (LIPITOR) 10 MG tablet TAKE 1 TABLET (10 MG TOTAL) BY MOUTH DAILY. 07/04/20 07/04/21  Horald Pollen, MD  busPIRone (BUSPAR) 7.5 MG tablet Take 1 tablet (7.5 mg total) by mouth 2 (two) times daily. 12/25/20   Horald Pollen, MD  butalbital-acetaminophen-caffeine [facility]) [id number] MG tablet Take 1 tablet by mouth every 6 (six) hours as needed for headache. 12/25/20   Horald Pollen, MD  cyclobenzaprine (FLEXERIL) 10 MG tablet Take 1 tablet (10 mg total) by mouth 3 (three) times daily as needed for muscle spasms. 12/25/20   Horald Pollen, MD  dexlansoprazole (DEXILANT) 60 MG capsule Take 1 capsule (60  mg total) by mouth daily. 02/05/21   Horald Pollen, MD  DULoxetine (CYMBALTA) 60 MG capsule Take 1 capsule (60 mg total) by mouth daily. 02/05/21   Horald Pollen, MD  fluticasone [facility]) 50 MCG/ACT nasal spray Place 1 spray into both nostrils daily. 12/25/20   Horald Pollen, MD  furosemide (LASIX) 20 MG tablet Take 1 tablet (20 mg total) by mouth 2 (two) times daily as needed. Patient not taking: Reported on 01/04/2021 09/13/19   Jacelyn Pi, Lilia Argue, MD  gabapentin (NEURONTIN) 300 MG capsule TAKE 1 TO 2 CAPSULES BY MOUTH 4 TIMES A DAY 07/04/20 07/04/21  Horald Pollen, MD  guaiFENesin (ROBITUSSIN) 100 MG/5ML SOLN Take 5 mLs (100 mg total) by mouth every 4 (four) hours as needed for cough or to loosen phlegm. 01/18/21   Montine Circle, PA-C  lisinopril (ZESTRIL) 20 MG tablet TAKE 1 TABLET (20 MG TOTAL) BY MOUTH DAILY. 07/04/20 07/04/21  Horald Pollen, MD  ondansetron (ZOFRAN-ODT) 4 MG disintegrating tablet Take 1 tablet (4 mg total) by mouth every 8 (eight) hours as needed for nausea or vomiting. 12/25/20   Horald Pollen, MD  propranolol (INDERAL) 60 MG tablet TAKE 1 TABLET (60 MG TOTAL) BY MOUTH 3 (THREE) TIMES DAILY. 04/26/20 04/26/21  Lendon Colonel, NP  sertraline (ZOLOFT) 100 MG tablet Take 1 tablet (100 mg total) by mouth daily. 02/05/21   Horald Pollen, MD  topiramate (TOPAMAX) 50 MG tablet TAKE 1 TABLET BY MOUTH TWICE DAILY 10/25/20 10/25/21  Horald Pollen, MD  valACYclovir (VALTREX) 1000 MG tablet Take 2 tablets (2000 mg) initally, then 1 tablet (1000 mg) every 12 hours until resolved, no more than 10 days 01/11/21   Mar Daring, PA-C    Allergies  Allergen Reactions   Sulfa Antibiotics Itching    Patient Active Problem List   Diagnosis Date Noted   Hx of adenomatous colonic polyps 01/02/2021   Pulmonary arterial hypertension ([name]) 07/04/2020   Kidney cysts 07/04/2020   Hyperlipidemia 07/04/2020   Depression 07/04/2020    Iron deficiency anemia 10/13/2018   DOE (dyspnea on exertion) 03/23/2018   Obesity, morbid, BMI 40.0-49.9 ([name]) 03/23/2018   Chronic diastolic heart failure ([name]) 03/23/2018   Grade I diastolic dysfunction 78/24/2353   Migraine without aura and without status migrainosus, not intractable 03/02/2017   Class 3 severe obesity due to excess calories with serious comorbidity and body mass index (BMI) of 40.0 to 44.9 in adult ([name]) 01/13/2017   Hiatal hernia 10/28/2016   Pure hypercholesterolemia 10/28/2016   Chronic kidney disease 06/01/2015   Gastroesophageal reflux disease 06/01/2015   Goiter 06/01/2015   Essential hypertension 06/01/2015   Chronic pain syndrome 05/28/2015   Fibromyalgia 05/28/2015   OSA (obstructive sleep apnea) 05/28/2015   Adjustment disorder with mixed anxiety and depressed mood 05/28/2015    Past Medical History:  Diagnosis Date   Allergy    [name], Flonase   Anemia    Anxiety    Arthritis    Chronic kidney disease    Colon polyps    Depression    Fibromyalgia    Gastritis    GERD (gastroesophageal reflux disease)    Hernia, hiatal    High risk for colon cancer    HLD (hyperlipidemia)    Hypertension    Migraine    Neuropathy    Osteoporosis    Pneumonia    Sleep apnea    c-pap nightly   Sleep apnea with use of continuous positive airway pressure (CPAP)    Thyroid goiter     Past Surgical History:  Procedure Laterality Date   48-Hour Holter Monitor  06/2017   Mostly normal sinus rhythm.  Average heart rate 71 bpm.  Minimum 54 bpm.  Maximum sinus rate 114 bpm.  Rare PACs and PVCs.  No A. fib, atrial flutter, SVT or VT.  One brief run of 8 beat PAT.   ABDOMINAL HYSTERECTOMY     ovaries intact; DUB.  No dysplasia.   FRACTURE SURGERY N/A    [employer] 09/25/2020   JOINT REPLACEMENT     OPEN REDUCTION INTERNAL FIXATION (ORIF) DISTAL RADIAL FRACTURE Right 07/09/2017   Procedure: RIGHT WRIST OPEN REDUCTION INTERNAL FIXATION (ORIF) DISTAL RADIAL  FRACTURE AND REPAIR AS INDICATED;  Surgeon: Iran Planas, MD;  Location: [facility];  Service: Orthopedics;  Laterality: Right;   PARTIAL HYSTERECTOMY     ROTATOR CUFF REPAIR Right 2009   SHOULDER SURGERY Right    shoulder dislocation, fall, and elbow unla nerve   SHOULDER SURGERY Right 2015   labral tear   TRANSTHORACIC ECHOCARDIOGRAM  06/2017    EF 55-60%.  grade 1 diastolic dysfunction.  Otherwise normal   TUBAL LIGATION     ULNAR NERVE REPAIR Right 08/19/2014    Social History   Socioeconomic History   Marital status: Married    Spouse name: Not on file   Number of children: 3   Years of education: Not on file   Highest education level: Not on file  Occupational  History   Occupation: unemployed  Tobacco Use   Smoking status: Former    Types: Cigarettes    Quit date: 08/04/1998    Years since quitting: 22.5   Smokeless tobacco: Never  Vaping Use   Vaping Use: Never used  Substance and Sexual Activity   Alcohol use: No   Drug use: No   Sexual activity: Yes    Birth control/protection: Surgical, Post-menopausal  Other Topics Concern   Not on file  Social History Narrative   Marital status: married x 20 years; moderately happy     Children: 3 children (47 [name], 58 Dianna, 40 April); 8 grandchildren; 0 gg     Lives: with husband/Steve, April, 2 granddaughters     Employment:  Unemployed; quit working 2015 with work related injury; awaiting disability in 2018; disability approved in 02/2017.       Tobacco: quit smoking in 2016; smoked x 2 years.      Alcohol: none      Drugs: none      Exercise: rarely in 2018         Social Determinants of Health   Financial Resource Strain: Not on file  Food Insecurity: Not on file  Transportation Needs: Not on file  Physical Activity: Not on file  Stress: Not on file  Social Connections: Not on file  Intimate Partner Violence: Not on file    Family History  Problem Relation Age of Onset   Heart disease Mother 33        AMI/CAD/CHF as cause of death   Hypertension Mother    Hyperlipidemia Mother    Hypothyroidism Mother    Depression Mother    Gout Mother    Heart disease Father 56       AMI   Hypertension Father    Stroke Father 55       mild CVA   Prostate cancer Father    Hyperlipidemia Father    Cancer Father 10       prostate cancer   Hypertension Brother    Hyperlipidemia Brother    Cancer Brother        prostate cancer   Cancer Maternal Grandmother        type unknown   Cancer Maternal Grandfather        type unknown   Heart disease Paternal Grandmother    Heart defect Paternal Grandfather    Melanoma Brother    Hyperlipidemia Brother    Hypertension Brother    Cancer Brother        melanoma   Hypertension Brother    Hyperlipidemia Brother    Melanoma Brother    Cancer Brother 41       melanoma   Cancer Sister        Basal cell carcinoma scalp   Depression Sister    Hypertension Brother    Depression Brother    Hypertension Brother    Gout Brother    Arthritis Sister    Depression Sister    Colon cancer Neg Hx    Esophageal cancer Neg Hx    Stomach cancer Neg Hx    Rectal cancer Neg Hx      Review of Systems  Constitutional: Negative.  Negative for chills and fever.  HENT:  Positive for congestion and ear pain. Negative for sore throat.   Respiratory:  Negative for cough and shortness of breath.   Cardiovascular:  Negative for chest pain and palpitations.  Gastrointestinal:  Positive for nausea.  Negative for abdominal pain, diarrhea and vomiting.  Genitourinary: Negative.  Negative for dysuria and hematuria.  Skin: Negative.  Negative for rash.  Neurological:  Positive for dizziness.  All other systems reviewed and are negative.  Today's Vitals   02/20/21 1451  BP: 130/80  Pulse: 75  Resp: 18  Temp: 98.3 F (36.8 C)  TempSrc: Oral  SpO2: 94%  Weight: 242 lb 6.4 oz (110 kg)  Height: 5\' 4"  (1.626 m)   Body mass index is 41.61 kg/m.  Physical  Exam Vitals reviewed.  Constitutional:      Appearance: Normal appearance.  HENT:     Head: Normocephalic.     Right Ear: Tympanic membrane, ear canal and external ear normal.     Left Ear: Tympanic membrane, ear canal and external ear normal.     Mouth/Throat:     Mouth: Mucous membranes are moist.     Pharynx: Oropharynx is clear.  Eyes:     Extraocular Movements: Extraocular movements intact.     Conjunctiva/sclera: Conjunctivae normal.     Pupils: Pupils are equal, round, and reactive to light.  Neck:     Vascular: No carotid bruit.  Cardiovascular:     Rate and Rhythm: Normal rate and regular rhythm.     Pulses: Normal pulses.     Heart sounds: Normal heart sounds.  Pulmonary:     Effort: Pulmonary effort is normal.     Breath sounds: Normal breath sounds.  Musculoskeletal:        General: Normal range of motion.     Cervical back: Normal range of motion and neck supple. No tenderness.  Lymphadenopathy:     Cervical: No cervical adenopathy.  Skin:    General: Skin is warm and dry.     Capillary Refill: Capillary refill takes less than 2 seconds.  Neurological:     General: No focal deficit present.     Mental Status: She is alert and oriented to person, place, and time.     Sensory: No sensory deficit.     Gait: Gait normal.  Psychiatric:        Mood and Affect: Mood normal.        Behavior: Behavior normal.     ASSESSMENT & PLAN: Shekera was seen today for dizziness and right ear conern.  Diagnoses and all orders for this visit:  Discomfort of right ear  Fibromyalgia -     gabapentin (NEURONTIN) 300 MG capsule; TAKE 1 TO 2 CAPSULES BY MOUTH 4 TIMES A DAY  Nerve pain -     gabapentin (NEURONTIN) 300 MG capsule; TAKE 1 TO 2 CAPSULES BY MOUTH 4 TIMES A DAY  Essential hypertension -     lisinopril (ZESTRIL) 20 MG tablet; TAKE 1 TABLET (20 MG TOTAL) BY MOUTH DAILY.  Hyperlipidemia, unspecified hyperlipidemia type -     atorvastatin (LIPITOR) 10 MG tablet;  TAKE 1 TABLET (10 MG TOTAL) BY MOUTH DAILY.  Sinus congestion  Acute non-recurrent maxillary sinusitis -     amoxicillin-clavulanate (AUGMENTIN) 875-125 MG tablet; Take 1 tablet by mouth 2 (two) times daily for 7 days.  Patient Instructions  Earache, Adult An earache, or ear pain, can be caused by many things, including: An infection. Ear wax buildup. Ear pressure. Something in the ear that should not be there (foreign body). A sore throat. Tooth problems. Jaw problems. Treatment of the earache will depend on the cause. If the cause is not clear or cannot be determined, you may need  to watch your symptoms until your earachegoes away or until a cause is found. Follow these instructions at home: Medicines Take or apply over-the-counter and prescription medicines only as told by your health care provider. If you were prescribed an antibiotic medicine, use it as told by your health care provider. Do not stop using the antibiotic even if you start to feel better. Do not put anything in your ear other than medicine that is prescribed by your health care provider. Managing pain If directed, apply heat to the affected area as often as told by your health care provider. Use the heat source that your health care provider recommends, such as a moist heat pack or a heating pad. Place a towel between your skin and the heat source. Leave the heat on for 20-30 minutes. Remove the heat if your skin turns bright red. This is especially important if you are unable to feel pain, heat, or cold. You may have a greater risk of getting burned. If directed, put ice on the affected area as often as told by your health care provider. To do this:     Put ice in a plastic bag. Place a towel between your skin and the bag. Leave the ice on for 20 minutes, 2-3 times a day.  General instructions Pay attention to any changes in your symptoms. Try resting in an upright position instead of lying down. This may  help to reduce pressure in your ear and relieve pain. Chew gum if it helps to relieve your ear pain. Treat any allergies as told by your health care provider. Drink enough fluid to keep your urine pale yellow. It is up to you to get the results of any tests that were done. Ask your health care provider, or the department that is doing the tests, when your results will be ready. Keep all follow-up visits as told by your health care provider. This is important. Contact a health care provider if: Your pain does not improve within 2 days. Your earache gets worse. You have new symptoms. You have a fever. Get help right away if you: Have a severe headache. Have a stiff neck. Have trouble swallowing. Have redness or swelling behind your ear. Have fluid or blood coming from your ear. Have hearing loss. Feel dizzy. Summary An earache, or ear pain, can be caused by many things. Treatment of the earache will depend on the cause. Follow recommendations from your health care provider to treat your ear pain. If the cause is not clear or cannot be determined, you may need to watch your symptoms until your earache goes away or until a cause is found. Keep all follow-up visits as told by your health care provider. This is important. This information is not intended to replace advice given to you by your health care provider. Make sure you discuss any questions you have with your healthcare provider. Document Revised: 02/26/2019 Document Reviewed: 02/26/2019 Elsevier Patient Education  2022 [place], MD [facility]

## 2021-02-27 ENCOUNTER — Other Ambulatory Visit (HOSPITAL_COMMUNITY): Payer: Self-pay

## 2021-02-27 MED FILL — Propranolol HCl Tab 60 MG: ORAL | 90 days supply | Qty: 270 | Fill #0 | Status: AC

## 2021-03-12 ENCOUNTER — Ambulatory Visit: Payer: Medicare Other | Admitting: Emergency Medicine

## 2021-03-26 ENCOUNTER — Other Ambulatory Visit: Payer: Self-pay

## 2021-03-26 ENCOUNTER — Ambulatory Visit
Admission: RE | Admit: 2021-03-26 | Discharge: 2021-03-26 | Disposition: A | Discharge status: Home / Self Care | Payer: Medicare Other | Source: Ambulatory Visit | Attending: Oncology | Admitting: Oncology

## 2021-03-26 DIAGNOSIS — Z1231 Encounter for screening mammogram for malignant neoplasm of breast: Secondary | ICD-10-CM | POA: Diagnosis not present

## 2021-04-02 ENCOUNTER — Other Ambulatory Visit (HOSPITAL_COMMUNITY): Payer: Self-pay

## 2021-04-09 NOTE — Progress Notes (Signed)
Cardiology Office Note   Date:  04/10/2021   ID:  Melissa, [name] 03/23/1956, MRN [id number]  PCP:  Horald Pollen, MD  Cardiologist:  Glenetta Hew, MD EP: None  Chief Complaint  Patient presents with   Follow-up    12 months.   Headache   Shortness of Breath   Chest Pain   Edema      History of Present Illness: [name] is a 65 y.o. female with a PMH of non-obstructive CAD, HTN, HLD, fibromyalgia, iron deficiency anemia, OSA, and obesity, who presents for routine follow-up  She was last evaluated by cardiology at an outpatient visit with Dr. Ellyn Hack 08/2019 at which time she still reported DOE but no complaints of CP or SOB at rest. Her DOE was felt to be multifactorial with weight likely contributing as well as possible microvascular disease or pulmHTN playing a roll. She was recommended to lose weight and consider addition of amlodipine if BP uptrends. She was recommended to consider pulmonology evaluation if symptoms do not improve.   Her last echocardiogram 10/2018 showed EF 60-65%, normal LV diastolic function, no RWMA, normal RV size/function, mild biatrial enlargement, and no significant valvular abnormalities. She had a coronary CTA 06/2019 which showed calcium score of 0 with mild pRCA stenosis, otherwise normal coronary arteries, and possible pulmonary arterial hypertension.   She presents today for routine follow-up. She has been doing fairly well over the past 1.5 years.  She continues to have DOE which is unchanged recently.  She describes occasional atypical chest discomfort oftentimes in the evenings when she is winding down lasting for less than 1 minute.  No exertional chest pain complaints.  She has occasional lower extremity edema which is also unchanged recently.  She has chronic dizziness but no complaints of presyncope or syncope.  She has occasional orthopnea and is mostly compliant with her CPAP.  She continues to struggle with iron deficiency  anemia and had a colonoscopy 01/2021 with polyp removal source of bleeding.  She has had some increased personal stress as her daughter and 2 granddaughters have been living with her over the summer.  She has been enjoying spending time with her aunt which has been a source of comfort and calm.    Past Medical History:  Diagnosis Date   Allergy    [name], Flonase   Anemia    Anxiety    Arthritis    Chronic kidney disease    Colon polyps    Depression    Fibromyalgia    Gastritis    GERD (gastroesophageal reflux disease)    Hernia, hiatal    High risk for colon cancer    HLD (hyperlipidemia)    Hypertension    Migraine    Neuropathy    Osteoporosis    Pneumonia    Sleep apnea    c-pap nightly   Sleep apnea with use of continuous positive airway pressure (CPAP)    Thyroid goiter     Past Surgical History:  Procedure Laterality Date   48-Hour Holter Monitor  06/2017   Mostly normal sinus rhythm.  Average heart rate 71 bpm.  Minimum 54 bpm.  Maximum sinus rate 114 bpm.  Rare PACs and PVCs.  No A. fib, atrial flutter, SVT or VT.  One brief run of 8 beat PAT.   ABDOMINAL HYSTERECTOMY     ovaries intact; DUB.  No dysplasia.   FRACTURE SURGERY N/A    [employer]reesia 09/25/2020   JOINT  REPLACEMENT     OPEN REDUCTION INTERNAL FIXATION (ORIF) DISTAL RADIAL FRACTURE Right 07/09/2017   Procedure: RIGHT WRIST OPEN REDUCTION INTERNAL FIXATION (ORIF) DISTAL RADIAL FRACTURE AND REPAIR AS INDICATED;  Surgeon: Iran Planas, MD;  Location: [facility];  Service: Orthopedics;  Laterality: Right;   PARTIAL HYSTERECTOMY     ROTATOR CUFF REPAIR Right 2009   SHOULDER SURGERY Right    shoulder dislocation, fall, and elbow unla nerve   SHOULDER SURGERY Right 2015   labral tear   TRANSTHORACIC ECHOCARDIOGRAM  06/2017    EF 55-60%.  grade 1 diastolic dysfunction.  Otherwise normal   TUBAL LIGATION     ULNAR NERVE REPAIR Right 08/19/2014     Current Outpatient Medications  Medication Sig Dispense Refill    albuterol (PROAIR HFA) 108 (90 Base) MCG/ACT inhaler Inhale 1-2 puffs into the lungs every 4 (four) hours as needed for wheezing or shortness of breath. 18 g 3   atorvastatin (LIPITOR) 20 MG tablet Take 1 tablet (20 mg total) by mouth daily. 90 tablet 3   busPIRone (BUSPAR) 7.5 MG tablet Take 1 tablet (7.5 mg total) by mouth 2 (two) times daily. 180 tablet 1   butalbital-acetaminophen-caffeine (FIORICET) 50-325-40 MG tablet Take 1 tablet by mouth every 6 (six) hours as needed for headache. 20 tablet 1   cyclobenzaprine (FLEXERIL) 10 MG tablet Take 1 tablet (10 mg total) by mouth 3 (three) times daily as needed for muscle spasms. 180 tablet 1   dexlansoprazole (DEXILANT) 60 MG capsule Take 1 capsule (60 mg total) by mouth daily. 90 capsule 1   DULoxetine (CYMBALTA) 60 MG capsule Take 1 capsule (60 mg total) by mouth daily. 90 capsule 1   fluticasone (FLONASE) 50 MCG/ACT nasal spray Place 1 spray into both nostrils daily. 16 g 6   furosemide (LASIX) 20 MG tablet Take 1 tablet (20 mg total) by mouth 2 (two) times daily as needed. 135 tablet 4   gabapentin (NEURONTIN) 300 MG capsule Take 1-2 capsules (300-600 mg total) by mouth 4 (four) times daily. 540 capsule 1   guaiFENesin (ROBITUSSIN) 100 MG/5ML SOLN Take 5 mLs (100 mg total) by mouth every 4 (four) hours as needed for cough or to loosen phlegm. 236 mL 0   lisinopril (ZESTRIL) 20 MG tablet Take 1 tablet (20 mg total) by mouth daily. 90 tablet 1   ondansetron (ZOFRAN-ODT) 4 MG disintegrating tablet Take 1 tablet (4 mg total) by mouth every 8 (eight) hours as needed for nausea or vomiting. 20 tablet 4   propranolol (INDERAL) 60 MG tablet TAKE 1 TABLET (60 MG TOTAL) BY MOUTH 3 (THREE) TIMES DAILY. 270 tablet 3   sertraline (ZOLOFT) 100 MG tablet Take 1 tablet (100 mg total) by mouth daily. 90 tablet 1   topiramate (TOPAMAX) 50 MG tablet TAKE 1 TABLET BY MOUTH TWICE DAILY 180 tablet 1   valACYclovir (VALTREX) 1000 MG tablet Take 2 tablets (2000 mg)  initally, then 1 tablet (1000 mg) every 12 hours until resolved, no more than 10 days 20 tablet 0   No current facility-administered medications for this visit.    Allergies:   Sulfa antibiotics    Social History:  The patient  reports that she quit smoking about 22 years ago. She has never used smokeless tobacco. She reports that she does not drink alcohol and does not use drugs.   Family History:  The patient's family history includes Arthritis in her sister; Cancer in her brother, brother, maternal grandfather, maternal grandmother, and  sister; Cancer (age of onset: 22) in her brother; Cancer (age of onset: 74) in her father; Depression in her brother, mother, sister, and sister; Gout in her brother and mother; Heart defect in her paternal grandfather; Heart disease in her paternal grandmother; Heart disease (age of onset: 7) in her father; Heart disease (age of onset: 51) in her mother; Hyperlipidemia in her brother, brother, brother, father, and mother; Hypertension in her brother, brother, brother, brother, brother, father, and mother; Hypothyroidism in her mother; Melanoma in her brother and brother; Prostate cancer in her father; Stroke (age of onset: 50) in her father.    ROS:  Please see the history of present illness.   Otherwise, review of systems are positive for none.   All other systems are reviewed and negative.    PHYSICAL EXAM: VS:  BP 126/84 (BP Location: Left Arm, Patient Position: Sitting, Cuff Size: Large)   Pulse 63   Ht '5\' 4"'$  (1.626 m)   Wt 240 lb (108.9 kg)   BMI 41.20 kg/m  , BMI Body mass index is 41.2 kg/m. GEN: Well nourished, well developed, in no acute distress HEENT: Sclera anicteric Neck: no JVD, carotid bruits, or masses Cardiac: RRR; no murmurs, rubs, or gallops, trace LE edema  Respiratory:  clear to auscultation bilaterally, normal work of breathing GI: soft, obese, nontender, nondistended, + BS MS: no deformity or atrophy Skin: warm and dry, no  rash Neuro:  Strength and sensation are intact Psych: euthymic mood, full affect   EKG:  EKG is ordered today. The ekg ordered today demonstrates sinus bradycardia, rate 63 bpm, nonspecific ST/T wave abnormalities, no significant ST E/D, no change from previous.   Recent Labs: 09/26/2020: ALT 10; BUN 10; Creatinine, Ser 0.98; Hemoglobin 13.1; Platelets 210; Potassium 4.4; Sodium 139; TSH 2.950    Lipid Panel    Component Value Date/Time   CHOL 167 09/26/2020 1427   TRIG 110 09/26/2020 1427   HDL 63 09/26/2020 1427   CHOLHDL 2.7 09/26/2020 1427   CHOLHDL 3.4 05/21/2016 1219   VLDL 13 05/21/2016 1219   LDLCALC 84 09/26/2020 1427      Wt Readings from Last 3 Encounters:  04/10/21 240 lb (108.9 kg)  02/20/21 242 lb 6.4 oz (110 kg)  01/04/21 244 lb (110.7 kg)      Other studies Reviewed: Additional studies/ records that were reviewed today include:   Echocardiogram 10/2018: 1. The left ventricle has normal systolic function with an ejection  fraction of 60-65%. The cavity size was normal. Left ventricular diastolic  parameters were normal No evidence of left ventricular regional wall  motion abnormalities.   2. The right ventricle has normal systolic function. The cavity was  normal. There is no increase in right ventricular wall thickness.   3. Left atrial size was mildly dilated.   4. Right atrial size was mildly dilated.   5. The mitral valve is normal in structure.   6. The tricuspid valve is normal in structure.   7. The aortic valve is normal in structure.   8. The pulmonic valve was normal in structure.   9. No evidence of left ventricular regional wall motion abnormalities.   Coronary CTA 06/2019: IMPRESSION: 1. Coronary calcium score of 10. This was 35 percentile for age and sex matched control.   2. Normal coronary origin with right dominance.   3. There is proximal focal calcification of the RCA with associated 0-24% stenosis. Non flow limiting disease.  CAD-RADS 1.   4.  Mildly dilated right ventricle (44 mm) and mildly dilated right atrium.  Extracardiac IMPRESSION: 1. Enlarged pulmonic trunk, indicative of pulmonary arterial hypertension. 2. Large hiatal hernia.   ASSESSMENT AND PLAN:  1. Non-obstructive CAD: mild pRCA stenosis noted on coronary CTA in 2020 - Continue aggressive risk factor modifications to promote goal BP <130/80, LDL <70, A1C <7  2. HTN: BP 126/84 today - Continue lisinopril, propranolol, and lasix  3. HLD: LDL 84 09/2020; goal <70 - Will increase atorvastatin to 20 mg daily - Continue annual monitoring for PCP  4. OSA: on CPAP - Continue CPAP   5. Obesity: BMI 41 today - Continue to encourage healthy dietary/lifestyle modifications to promote weight loss.    Current medicines are reviewed at length with the patient today.  The patient does not have concerns regarding medicines.  The following changes have been made:  As above  Labs/ tests ordered today include:   Orders Placed This Encounter  Procedures   EKG 12-Lead     Disposition:   FU with Dr. Ellyn Hack in 1 year  Signed, Abigail Butts, PA-C  04/10/2021 11:37 AM

## 2021-04-10 ENCOUNTER — Encounter: Payer: Self-pay | Admitting: Oncology

## 2021-04-10 ENCOUNTER — Other Ambulatory Visit: Payer: Self-pay

## 2021-04-10 ENCOUNTER — Encounter: Payer: Self-pay | Admitting: Medical

## 2021-04-10 ENCOUNTER — Other Ambulatory Visit (HOSPITAL_COMMUNITY): Payer: Self-pay

## 2021-04-10 ENCOUNTER — Ambulatory Visit (INDEPENDENT_AMBULATORY_CARE_PROVIDER_SITE_OTHER): Payer: 59 | Admitting: Medical

## 2021-04-10 VITALS — BP 126/84 | HR 63 | Ht 64.0 in | Wt 240.0 lb

## 2021-04-10 DIAGNOSIS — E7800 Pure hypercholesterolemia, unspecified: Secondary | ICD-10-CM | POA: Diagnosis not present

## 2021-04-10 DIAGNOSIS — Z9989 Dependence on other enabling machines and devices: Secondary | ICD-10-CM | POA: Diagnosis not present

## 2021-04-10 DIAGNOSIS — I2510 Atherosclerotic heart disease of native coronary artery without angina pectoris: Secondary | ICD-10-CM

## 2021-04-10 DIAGNOSIS — I10 Essential (primary) hypertension: Secondary | ICD-10-CM

## 2021-04-10 DIAGNOSIS — G4733 Obstructive sleep apnea (adult) (pediatric): Secondary | ICD-10-CM | POA: Diagnosis not present

## 2021-04-10 MED ORDER — ATORVASTATIN CALCIUM 20 MG PO TABS
20.0000 mg | ORAL_TABLET | Freq: Every day | ORAL | 3 refills | Status: DC
  Filled 2021-04-10: qty 90, 90d supply, fill #0

## 2021-04-10 NOTE — Patient Instructions (Signed)
Medication Instructions:  Start Atorvastatin 20 mg (1 Tablet Daily) *If you need a refill on your cardiac medications before your next appointment, please call your pharmacy*   Lab Work: No Labs If you have labs (blood work) drawn today and your tests are completely normal, you will receive your results only by: [facility] (if you have MyChart) OR A paper copy in the mail If you have any lab test that is abnormal or we need to change your treatment, we will call you to review the results.   Testing/Procedures: No Testing   Follow-Up: At [facility] you and your health needs are our priority.  As part of our continuing mission to provide you with exceptional heart care, we have created designated Provider Care Teams.  These Care Teams include your primary Cardiologist (physician) and Advanced Practice Providers (APPs -  Physician Assistants and Nurse Practitioners) who all work together to provide you with the care you need, when you need it.  We recommend signing up for the patient portal called "MyChart".  Sign up information is provided on this After Visit Summary.  MyChart is used to connect with patients for Virtual Visits (Telemedicine).  Patients are able to view lab/test results, encounter notes, upcoming appointments, etc.  Non-urgent messages can be sent to your provider as well.   To learn more about what you can do with MyChart, go to [email or web address].    Your next appointment:   1 year(s)  The format for your next appointment:   In Person  Provider:   Glenetta Hew, MD   Physical Activity With Heart Disease Being active has many benefits, especially if you have heart disease. Physical activity can help you do more and feel healthier. Start slowly, and increase the amount of time you spend being active. Most adults should aim for physical activity that: Makes you breathe harder and raises your heart rate (aerobic activity). Try to get at least 150 minutes  of aerobic activity each week. This is about 30 minutes each day, 5 days a week. Helps build muscle strength (strengthening activity). Do this at least 2 times a week. Always talk with your health care provider before starting any new activity program or if you have any changes in your condition. What are the benefits of physical activity? When you have heart disease, physical activity can help: Lower your blood pressure. Lower your cholesterol. Control your weight. Improve your sleep. Help control your blood sugar. Improve your heart and lung function. Reduce your risk for blood clots (thrombophlebitis). Improve your energy level. Reduce stress. What are some types of physical activity I could try? There are many ways to be active. Talk with your health care provider about what types and intensity of activity is right for you. Aerobic activity Aerobic (cardiovascular) activity can be moderate or vigorous intensity, depending on how hard you are working. Moderate-intensity activity includes: Walking. Slow bicycling. Water aerobics. Dancing. Light gardening or house work. Vigorous-intensity activity includes: Jogging or running. Stair climbing. Swimming laps. Hiking uphill. Heavy gardening, such as digging trenches.  Strengthening activity Strengthening activities work your muscles to build strength. Some examples include: Doing push-ups, sit-ups, or pull-ups. Lifting small weights. Using resistance bands.  Flexibility Flexibility activities lengthen your muscles to keep them flexible and less tight and improve your balance. Some examples include: Stretching. Yoga. Tai chi. [place] barre.  Follow these instructions at home: How to get started Talk with your health care provider about: What types of activities are safe  for you. If you should check your pulse or take other precautions during physical activity. Get a calendar. Write down a schedule and plan for your new  routine. Take time to find out what works for you. Consider: Joining a Camera operator, such as a biking group, yoga class, local gym, or swimming pool membership. Be active on your own by downloading free workout applications on a smartphone or other devices, or by purchasing workout DVDs. If you have not been active, begin with sessions that last 10-15 minutes. Gradually work up to sessions that last 20-30 minutes, 5 times a week. Follow all of your health care provider's recommendations. Be patient with yourself. It takes time to build up strength and lung capacity. Safety Exercise in an indoor, climate-controlled facility, as told by your health care provider. You may need to do this if: There are extreme outdoor conditions, such as heat, humidity, or cold. There is an air pollution advisory. Your local news, board of health, or hospital can provide information on air quality. Take extra precautions as told by your health care provider. This may include: Monitoring your heart rate. Avoiding heavy lifting. Understanding how your medicines can affect you during physical activity. Certain medicines may cause heat intolerance or changes in blood sugar. Slowing down to rest when you need to. Keeping nitroglycerin spray and tablets with you at all times if you have angina. Use them as told to prevent and treat symptoms. Drink plenty of water before, during, and after physical activity. Know what symptoms may be signs of a problem. Stop physical activity right away if you have any of these symptoms. Get help right away if you have any of the following during exercise: Chest pain, shortness of breath, or feel very tired. Pain in the arm, shoulder, neck, or jaw. Feel weak, dizzy, or light-headed. An irregular heart rate, or your heart rate is greater than 100 beats per minute (bpm) before exercise. These symptoms may represent a serious problem that is an emergency. Do not wait to see if the  symptoms go away. Get medical help right away. Call your local emergency services (911 in the [place] Do not drive yourself to the hospital.  Summary Physical activity has many benefits, especially if you have heart disease. Before starting an activity program, talk with your health care provider about how often to be active and what type of activity is safe for you. Your physical activity plan may include moderate or vigorous aerobic activity, strengthening activities, and flexibility. Know what symptoms may be signs of a problem. Stop physical activity right away and call emergency services (911 in the [place] if you have any of these symptoms. This information is not intended to replace advice given to you by your health care provider. Make sure you discuss any questions you have with your health care provider. Document Revised: 08/12/2017 Document Reviewed: 08/12/2017 Elsevier Patient Education  2022 [employer].

## 2021-04-11 DIAGNOSIS — H524 Presbyopia: Secondary | ICD-10-CM | POA: Diagnosis not present

## 2021-05-05 ENCOUNTER — Other Ambulatory Visit: Payer: Self-pay | Admitting: Emergency Medicine

## 2021-05-05 DIAGNOSIS — G43909 Migraine, unspecified, not intractable, without status migrainosus: Secondary | ICD-10-CM

## 2021-05-06 ENCOUNTER — Other Ambulatory Visit (HOSPITAL_COMMUNITY): Payer: Self-pay

## 2021-05-06 MED ORDER — TOPIRAMATE 50 MG PO TABS
50.0000 mg | ORAL_TABLET | Freq: Two times a day (BID) | ORAL | 1 refills | Status: DC
  Filled 2021-05-06: qty 180, 90d supply, fill #0
  Filled 2021-08-22: qty 180, 90d supply, fill #1

## 2021-05-07 ENCOUNTER — Other Ambulatory Visit (HOSPITAL_COMMUNITY): Payer: Self-pay

## 2021-05-30 ENCOUNTER — Other Ambulatory Visit (HOSPITAL_BASED_OUTPATIENT_CLINIC_OR_DEPARTMENT_OTHER): Payer: Self-pay

## 2021-05-30 ENCOUNTER — Ambulatory Visit: Payer: 59 | Attending: Internal Medicine

## 2021-05-30 DIAGNOSIS — Z23 Encounter for immunization: Secondary | ICD-10-CM

## 2021-05-30 MED ORDER — INFLUENZA VAC A&B SA ADJ QUAD 0.5 ML IM PRSY
PREFILLED_SYRINGE | INTRAMUSCULAR | 0 refills | Status: DC
Start: 2021-05-30 — End: 2022-03-26
  Filled 2021-05-30: qty 0.5, 1d supply, fill #0

## 2021-05-30 NOTE — Progress Notes (Signed)
   Covid-19 Vaccination Clinic  Name:  [name]    MRN: [id number] DOB: 01-Dec-1955  05/30/2021  [name] was observed post Covid-19 immunization for 15 minutes without incident. She was provided with Vaccine Information Sheet and instruction to access the V-Safe system.   [name] was instructed to call 911 with any severe reactions post vaccine: Difficulty breathing  Swelling of face and throat  A fast heartbeat  A bad rash all over body  Dizziness and weakness   Immunizations Administered     Name Date Dose VIS Date Route   Pfizer Covid-19 Vaccine Bivalent Booster 05/30/2021  1:57 PM 0.3 mL 04/03/2021 Intramuscular   Manufacturer: [place]   Lot: [id number]   [place]: [phone]

## 2021-06-05 ENCOUNTER — Other Ambulatory Visit: Payer: Self-pay | Admitting: Adult Health

## 2021-06-05 ENCOUNTER — Other Ambulatory Visit (HOSPITAL_COMMUNITY): Payer: Self-pay

## 2021-06-05 MED ORDER — PROPRANOLOL HCL 60 MG PO TABS
60.0000 mg | ORAL_TABLET | Freq: Three times a day (TID) | ORAL | 3 refills | Status: DC
  Filled 2021-06-05: qty 270, 90d supply, fill #0
  Filled 2021-09-13: qty 270, 90d supply, fill #1
  Filled 2021-12-16: qty 270, 90d supply, fill #2
  Filled 2022-03-31: qty 50, 16d supply, fill #3
  Filled 2022-03-31: qty 220, 74d supply, fill #3

## 2021-06-24 ENCOUNTER — Other Ambulatory Visit (HOSPITAL_BASED_OUTPATIENT_CLINIC_OR_DEPARTMENT_OTHER): Payer: Self-pay

## 2021-06-24 MED ORDER — PFIZER COVID-19 VAC BIVALENT 30 MCG/0.3ML IM SUSP
INTRAMUSCULAR | 0 refills | Status: DC
  Filled 2021-06-24: qty 0.3, 1d supply, fill #0

## 2021-07-01 ENCOUNTER — Ambulatory Visit (INDEPENDENT_AMBULATORY_CARE_PROVIDER_SITE_OTHER): Payer: 59 | Admitting: Emergency Medicine

## 2021-07-01 ENCOUNTER — Other Ambulatory Visit: Payer: Self-pay

## 2021-07-01 ENCOUNTER — Other Ambulatory Visit (HOSPITAL_COMMUNITY): Payer: Self-pay

## 2021-07-01 ENCOUNTER — Encounter: Payer: Self-pay | Admitting: Emergency Medicine

## 2021-07-01 VITALS — BP 124/80 | HR 66 | Temp 98.5°F | Ht 64.0 in | Wt 243.0 lb

## 2021-07-01 DIAGNOSIS — G4733 Obstructive sleep apnea (adult) (pediatric): Secondary | ICD-10-CM

## 2021-07-01 DIAGNOSIS — Z23 Encounter for immunization: Secondary | ICD-10-CM

## 2021-07-01 DIAGNOSIS — H9311 Tinnitus, right ear: Secondary | ICD-10-CM

## 2021-07-01 DIAGNOSIS — Z8719 Personal history of other diseases of the digestive system: Secondary | ICD-10-CM | POA: Diagnosis not present

## 2021-07-01 DIAGNOSIS — F339 Major depressive disorder, recurrent, unspecified: Secondary | ICD-10-CM | POA: Diagnosis not present

## 2021-07-01 DIAGNOSIS — G894 Chronic pain syndrome: Secondary | ICD-10-CM

## 2021-07-01 DIAGNOSIS — M797 Fibromyalgia: Secondary | ICD-10-CM | POA: Diagnosis not present

## 2021-07-01 DIAGNOSIS — N189 Chronic kidney disease, unspecified: Secondary | ICD-10-CM

## 2021-07-01 DIAGNOSIS — I2721 Secondary pulmonary arterial hypertension: Secondary | ICD-10-CM | POA: Diagnosis not present

## 2021-07-01 DIAGNOSIS — E785 Hyperlipidemia, unspecified: Secondary | ICD-10-CM

## 2021-07-01 DIAGNOSIS — I5032 Chronic diastolic (congestive) heart failure: Secondary | ICD-10-CM | POA: Diagnosis not present

## 2021-07-01 DIAGNOSIS — K219 Gastro-esophageal reflux disease without esophagitis: Secondary | ICD-10-CM

## 2021-07-01 DIAGNOSIS — R7303 Prediabetes: Secondary | ICD-10-CM

## 2021-07-01 DIAGNOSIS — I10 Essential (primary) hypertension: Secondary | ICD-10-CM | POA: Diagnosis not present

## 2021-07-01 DIAGNOSIS — F331 Major depressive disorder, recurrent, moderate: Secondary | ICD-10-CM

## 2021-07-01 LAB — COMPREHENSIVE METABOLIC PANEL
ALT: 11 U/L (ref 0–35)
AST: 16 U/L (ref 0–37)
Albumin: 4 g/dL (ref 3.5–5.2)
Alkaline Phosphatase: 70 U/L (ref 39–117)
BUN: 9 mg/dL (ref 6–23)
CO2: 25 mEq/L (ref 19–32)
Calcium: 8.9 mg/dL (ref 8.4–10.5)
Chloride: 109 mEq/L (ref 96–112)
Creatinine, Ser: 1.06 mg/dL (ref 0.40–1.20)
GFR: 55.19 mL/min — ABNORMAL LOW (ref >60.00)
Glucose, Bld: 90 mg/dL (ref 70–99)
Potassium: 4 mEq/L (ref 3.5–5.1)
Sodium: 141 mEq/L (ref 135–145)
Total Bilirubin: 0.3 mg/dL (ref 0.2–1.2)
Total Protein: 6.6 g/dL (ref 6.0–8.3)

## 2021-07-01 LAB — TSH: TSH: 0.95 u[IU]/mL (ref 0.35–5.50)

## 2021-07-01 LAB — CBC WITH DIFFERENTIAL/PLATELET
Basophils Absolute: 0 10*3/uL (ref 0.0–0.1)
Basophils Relative: 0.3 % (ref 0.0–3.0)
Eosinophils Absolute: 0.1 10*3/uL (ref 0.0–0.7)
Eosinophils Relative: 2.3 % (ref 0.0–5.0)
HCT: 37.3 % (ref 36.0–46.0)
Hemoglobin: 12 g/dL (ref 12.0–15.0)
Lymphocytes Relative: 25.8 % (ref 12.0–46.0)
Lymphs Abs: 1.2 10*3/uL (ref 0.7–4.0)
MCHC: 32.2 g/dL (ref 30.0–36.0)
MCV: 90.8 fl (ref 78.0–100.0)
Monocytes Absolute: 0.3 10*3/uL (ref 0.1–1.0)
Monocytes Relative: 7.3 % (ref 3.0–12.0)
Neutro Abs: 2.9 10*3/uL (ref 1.4–7.7)
Neutrophils Relative %: 64.3 % (ref 43.0–77.0)
Platelets: 197 10*3/uL (ref 150.0–400.0)
RBC: 4.11 Mil/uL (ref 3.87–5.11)
RDW: 13.2 % (ref 11.5–15.5)
WBC: 4.5 10*3/uL (ref 4.0–10.5)

## 2021-07-01 LAB — LIPID PANEL
Cholesterol: 146 mg/dL (ref 0–200)
HDL: 56.2 mg/dL (ref >39.00)
LDL Cholesterol: 68 mg/dL (ref 0–99)
NonHDL: 89.38
Total CHOL/HDL Ratio: 3
Triglycerides: 107 mg/dL (ref 0.0–149.0)
VLDL: 21.4 mg/dL (ref 0.0–40.0)

## 2021-07-01 LAB — HEMOGLOBIN A1C: Hgb A1c MFr Bld: 6 % (ref 4.6–6.5)

## 2021-07-01 MED ORDER — ATORVASTATIN CALCIUM 20 MG PO TABS
20.0000 mg | ORAL_TABLET | Freq: Every day | ORAL | 3 refills | Status: DC
  Filled 2021-07-01: qty 90, 90d supply, fill #0
  Filled 2021-10-07: qty 90, 90d supply, fill #1
  Filled 2022-02-01 – 2022-02-26 (×2): qty 90, 90d supply, fill #2
  Filled 2022-06-02: qty 90, 90d supply, fill #3

## 2021-07-01 MED ORDER — PANTOPRAZOLE SODIUM 40 MG PO TBEC
40.0000 mg | DELAYED_RELEASE_TABLET | Freq: Every day | ORAL | 1 refills | Status: DC
  Filled 2021-07-01: qty 90, 90d supply, fill #0
  Filled 2021-10-07: qty 90, 90d supply, fill #1

## 2021-07-01 MED ORDER — OZEMPIC (0.25 OR 0.5 MG/DOSE) 2 MG/1.5ML ~~LOC~~ SOPN
0.5000 mg | PEN_INJECTOR | SUBCUTANEOUS | 5 refills | Status: DC
  Filled 2021-07-01: qty 1.5, 28d supply, fill #0
  Filled 2021-07-24: qty 1.5, 28d supply, fill #1
  Filled 2021-08-22: qty 1.5, 28d supply, fill #2
  Filled 2021-09-20: qty 1.5, 28d supply, fill #3
  Filled 2021-10-22: qty 1.5, 28d supply, fill #4

## 2021-07-01 NOTE — Assessment & Plan Note (Signed)
Stable.  On Cymbalta and gabapentin.

## 2021-07-01 NOTE — Assessment & Plan Note (Signed)
Lab Results  Component Value Date   CREATININE 0.98 09/26/2020   BUN 10 09/26/2020   NA 139 09/26/2020   K 4.4 09/26/2020   CL 104 09/26/2020   CO2 20 09/26/2020  CMP done today.

## 2021-07-01 NOTE — Assessment & Plan Note (Signed)
Diet and nutrition discussed.  As requested by patient, will start weekly Ozempic at 0.5 mg and titrate as tolerated.

## 2021-07-01 NOTE — Assessment & Plan Note (Signed)
Stable.  On CPAP treatment nightly.

## 2021-07-01 NOTE — Assessment & Plan Note (Signed)
Well-controlled hypertension. BP Readings from Last 3 Encounters:  07/01/21 124/80  04/10/21 126/84  02/20/21 130/80  Continue propranolol 60 mg, lisinopril 20 mg, and furosemide 20 mg daily. Dietary approaches to stop hypertension discussed.

## 2021-07-01 NOTE — Assessment & Plan Note (Signed)
Stable.  Continue Cymbalta. 

## 2021-07-01 NOTE — Progress Notes (Signed)
[name] 65 y.o.   Chief Complaint  Patient presents with   Hypertension     6 Month f/u. Pt states she still has ear pain from last visit   Anxiety    Pt states she has been feeling more depressed than usual    HISTORY OF PRESENT ILLNESS: This is a 65 y.o. female here for follow-up, has the following complaints: 1.  Chronic ringing to right ear.  Requesting ENT referral 2.  At recent cardiology appointment cholesterol medication was raised.  Needs new prescription for atorvastatin 20 mg daily 3.  History of GERD.  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  Needs new PPI medication 4.  Chronic depression, currently active.  On Cymbalta and Zoloft.  Needs psychiatric referral 5.  Requesting medication for obesity, Ozempic. 6.  History of hypertension.  Stable and well-controlled. No other complaints or medical concerns today. Most recent cardiologist office visit notes as follows: ASSESSMENT AND PLAN:   1. Non-obstructive CAD: mild pRCA stenosis noted on coronary CTA in 2020 - Continue aggressive risk factor modifications to promote goal BP <130/80, LDL <70, A1C <7   2. HTN: BP 126/84 today - Continue lisinopril, propranolol, and lasix   3. HLD: LDL 84 09/2020; goal <70 - Will increase atorvastatin to 20 mg daily - Continue annual monitoring for PCP   4. OSA: on CPAP - Continue CPAP    5. Obesity: BMI 41 today - Continue to encourage healthy dietary/lifestyle modifications to promote weight loss.      Current medicines are reviewed at length with the patient today.  The patient does not have concerns regarding medicines.   The following changes have been made:  As above   Labs/ tests ordered today include:       Orders Placed This Encounter  Procedures   EKG 12-Lead        Disposition:   FU with Dr. Ellyn Hack in 1 year   Signed, Abigail Butts, PA-C  04/10/2021 11:37 AM    BP Readings from Last 3 Encounters:  07/01/21 124/80  04/10/21 126/84  02/20/21 130/80      Hypertension Associated symptoms include anxiety. Pertinent negatives include no chest pain, headaches or palpitations.  Anxiety Patient reports no chest pain, dizziness, nausea, palpitations or suicidal ideas.      Prior to Admission medications   Medication Sig Start Date End Date Taking? Authorizing Provider  albuterol (PROAIR HFA) 108 (90 Base) MCG/ACT inhaler Inhale 1-2 puffs into the lungs every 4 (four) hours as needed for wheezing or shortness of breath. 12/25/20  Yes Bassel Gaskill, Ines Bloomer, MD  atorvastatin (LIPITOR) 20 MG tablet Take 1 tablet (20 mg total) by mouth daily. 04/10/21  Yes Kroeger, Daleen Snook M., PA-C  busPIRone (BUSPAR) 7.5 MG tablet Take 1 tablet (7.5 mg total) by mouth 2 (two) times daily. 12/25/20  Yes Porchia Sinkler, Ines Bloomer, MD  butalbital-acetaminophen-caffeine [facility]) [phone] MG tablet Take 1 tablet by mouth every 6 (six) hours as needed for headache. 12/25/20  Yes Jaelen Gellerman, Ines Bloomer, MD  COVID-19 mRNA bivalent vaccine, Pfizer, (PFIZER COVID-19 VAC BIVALENT) injection Inject into the muscle. 05/30/21  Yes Carlyle Basques, MD  cyclobenzaprine (FLEXERIL) 10 MG tablet Take 1 tablet (10 mg total) by mouth 3 (three) times daily as needed for muscle spasms. 12/25/20  Yes Fe Okubo, Ines Bloomer, MD  dexlansoprazole (DEXILANT) 60 MG capsule Take 1 capsule (60 mg total) by mouth daily. 02/05/21  Yes Horald Pollen, MD  DULoxetine (CYMBALTA) 60 MG capsule Take 1  capsule (60 mg total) by mouth daily. 02/05/21  Yes Gregary Blackard, Ines Bloomer, MD  fluticasone [facility]) 50 MCG/ACT nasal spray Place 1 spray into both nostrils daily. 12/25/20  Yes Aleza Pew, Ines Bloomer, MD  furosemide (LASIX) 20 MG tablet Take 1 tablet (20 mg total) by mouth 2 (two) times daily as needed. 09/13/19  Yes Jacelyn Pi, Lilia Argue, MD  gabapentin (NEURONTIN) 300 MG capsule Take 1-2 capsules (300-600 mg total) by mouth 4 (four) times daily. 02/20/21  Yes Isaish Alemu, Ines Bloomer, MD  guaiFENesin (ROBITUSSIN) 100  MG/5ML SOLN Take 5 mLs (100 mg total) by mouth every 4 (four) hours as needed for cough or to loosen phlegm. 01/18/21  Yes Montine Circle, PA-C  lisinopril (ZESTRIL) 20 MG tablet Take 1 tablet (20 mg total) by mouth daily. 02/20/21  Yes Millianna Szymborski, Ines Bloomer, MD  ondansetron (ZOFRAN-ODT) 4 MG disintegrating tablet Take 1 tablet (4 mg total) by mouth every 8 (eight) hours as needed for nausea or vomiting. 12/25/20  Yes Jenika Chiem, Ines Bloomer, MD  propranolol (INDERAL) 60 MG tablet Take 1 tablet (60 mg total) by mouth 3 (three) times daily. 06/05/21  Yes Leonie Man, MD  sertraline (ZOLOFT) 100 MG tablet Take 1 tablet (100 mg total) by mouth daily. 02/05/21  Yes Aiko Belko, Ines Bloomer, MD  topiramate (TOPAMAX) 50 MG tablet Take 1 tablet (50 mg total) by mouth 2 (two) times daily. 05/06/21  Yes Alisson Rozell, Ines Bloomer, MD  valACYclovir (VALTREX) 1000 MG tablet Take 2 tablets (2000 mg) initally, then 1 tablet (1000 mg) every 12 hours until resolved, no more than 10 days 01/11/21  Yes Mar Daring, PA-C  influenza vaccine adjuvanted (FLUAD) 0.5 ML injection Inject into the muscle. Patient not taking: Reported on 07/01/2021 05/30/21   Carlyle Basques, MD    Allergies  Allergen Reactions   Sulfa Antibiotics Itching    Patient Active Problem List   Diagnosis Date Noted   Hx of adenomatous colonic polyps 01/02/2021   Pulmonary arterial hypertension ([place]) 07/04/2020   Kidney cysts 07/04/2020   Hyperlipidemia 07/04/2020   Depression 07/04/2020   Iron deficiency anemia 10/13/2018   DOE (dyspnea on exertion) 03/23/2018   Obesity, morbid, BMI 40.0-49.9 ([place]) 03/23/2018   Chronic diastolic heart failure ([place]) 03/23/2018   Grade I diastolic dysfunction 10/96/0454   Migraine without aura and without status migrainosus, not intractable 03/02/2017   Class 3 severe obesity due to excess calories with serious comorbidity and body mass index (BMI) of 40.0 to 44.9 in adult ([place]) 01/13/2017   Hiatal hernia  10/28/2016   Pure hypercholesterolemia 10/28/2016   Chronic kidney disease 06/01/2015   Gastroesophageal reflux disease 06/01/2015   Goiter 06/01/2015   Essential hypertension 06/01/2015   Chronic pain syndrome 05/28/2015   Fibromyalgia 05/28/2015   OSA (obstructive sleep apnea) 05/28/2015   Adjustment disorder with mixed anxiety and depressed mood 05/28/2015    Past Medical History:  Diagnosis Date   Allergy    [name], Flonase   Anemia    Anxiety    Arthritis    Chronic kidney disease    Colon polyps    Depression    Fibromyalgia    Gastritis    GERD (gastroesophageal reflux disease)    Hernia, hiatal    High risk for colon cancer    HLD (hyperlipidemia)    Hypertension    Migraine    Neuropathy    Osteoporosis    Pneumonia    Sleep apnea    c-pap nightly   Sleep  apnea with use of continuous positive airway pressure (CPAP)    Thyroid goiter     Past Surgical History:  Procedure Laterality Date   48-Hour Holter Monitor  06/2017   Mostly normal sinus rhythm.  Average heart rate 71 bpm.  Minimum 54 bpm.  Maximum sinus rate 114 bpm.  Rare PACs and PVCs.  No A. fib, atrial flutter, SVT or VT.  One brief run of 8 beat PAT.   ABDOMINAL HYSTERECTOMY     ovaries intact; DUB.  No dysplasia.   FRACTURE SURGERY N/A    Phreesia 09/25/2020   JOINT REPLACEMENT     OPEN REDUCTION INTERNAL FIXATION (ORIF) DISTAL RADIAL FRACTURE Right 07/09/2017   Procedure: RIGHT WRIST OPEN REDUCTION INTERNAL FIXATION (ORIF) DISTAL RADIAL FRACTURE AND REPAIR AS INDICATED;  Surgeon: Iran Planas, MD;  Location: [place];  Service: Orthopedics;  Laterality: Right;   PARTIAL HYSTERECTOMY     ROTATOR CUFF REPAIR Right 2009   SHOULDER SURGERY Right    shoulder dislocation, fall, and elbow unla nerve   SHOULDER SURGERY Right 2015   labral tear   TRANSTHORACIC ECHOCARDIOGRAM  06/2017    EF 55-60%.  grade 1 diastolic dysfunction.  Otherwise normal   TUBAL LIGATION     ULNAR NERVE REPAIR Right  08/19/2014    Social History   Socioeconomic History   Marital status: Married    Spouse name: Not on file   Number of children: 3   Years of education: Not on file   Highest education level: Not on file  Occupational History   Occupation: unemployed  Tobacco Use   Smoking status: Former    Types: Cigarettes    Quit date: 08/04/1998    Years since quitting: 22.9   Smokeless tobacco: Never  Vaping Use   Vaping Use: Never used  Substance and Sexual Activity   Alcohol use: No   Drug use: No   Sexual activity: Yes    Birth control/protection: Surgical, Post-menopausal  Other Topics Concern   Not on file  Social History Narrative   Marital status: married x 20 years; moderately happy     Children: 3 children (47 [name], [place], 40 April); 8 grandchildren; 0 gg     Lives: with husband/Steve, April, 2 granddaughters     Employment:  Unemployed; quit working 2015 with work related injury; awaiting disability in 2018; disability approved in 02/2017.       Tobacco: quit smoking in 2016; smoked x 2 years.      Alcohol: none      Drugs: none      Exercise: rarely in 2018         Social Determinants of Health   Financial Resource Strain: Not on file  Food Insecurity: Not on file  Transportation Needs: Not on file  Physical Activity: Not on file  Stress: Not on file  Social Connections: Not on file  Intimate Partner Violence: Not on file    Family History  Problem Relation Age of Onset   Heart disease Mother 42       AMI/CAD/CHF as cause of death   Hypertension Mother    Hyperlipidemia Mother    Hypothyroidism Mother    Depression Mother    Gout Mother    Heart disease Father 55       AMI   Hypertension Father    Stroke Father 88       mild CVA   Prostate cancer Father    Hyperlipidemia Father  Cancer Father 34       prostate cancer   Hypertension Brother    Hyperlipidemia Brother    Cancer Brother        prostate cancer   Cancer Maternal Grandmother         type unknown   Cancer Maternal Grandfather        type unknown   Heart disease Paternal Grandmother    Heart defect Paternal Grandfather    Melanoma Brother    Hyperlipidemia Brother    Hypertension Brother    Cancer Brother        melanoma   Hypertension Brother    Hyperlipidemia Brother    Melanoma Brother    Cancer Brother 57       melanoma   Cancer Sister        Basal cell carcinoma scalp   Depression Sister    Hypertension Brother    Depression Brother    Hypertension Brother    Gout Brother    Arthritis Sister    Depression Sister    Colon cancer Neg Hx    Esophageal cancer Neg Hx    Stomach cancer Neg Hx    Rectal cancer Neg Hx      Review of Systems  Constitutional: Negative.  Negative for fever.  HENT:  Positive for tinnitus (Right ear).   Respiratory: Negative.  Negative for cough.   Cardiovascular: Negative.  Negative for chest pain and palpitations.  Gastrointestinal:  Positive for heartburn. Negative for abdominal pain, nausea and vomiting.  Genitourinary: Negative.   Musculoskeletal:  Positive for back pain and joint pain.  Skin: Negative.  Negative for rash.  Neurological: Negative.  Negative for dizziness and headaches.  Psychiatric/Behavioral:  Positive for depression. Negative for suicidal ideas.   All other systems reviewed and are negative.  Today's Vitals   07/01/21 1316  BP: 124/80  Pulse: 66  Temp: 98.5 F (36.9 C)  TempSrc: Oral  SpO2: 99%  Weight: 243 lb (110.2 kg)  Height: 5\' 4"  (1.626 m)   Body mass index is 41.71 kg/m. Wt Readings from Last 3 Encounters:  07/01/21 243 lb (110.2 kg)  04/10/21 240 lb (108.9 kg)  02/20/21 242 lb 6.4 oz (110 kg)    Physical Exam Vitals reviewed.  Constitutional:      Appearance: Normal appearance. She is obese.  HENT:     Head: Normocephalic.     Right Ear: Tympanic membrane, ear canal and external ear normal.     Mouth/Throat:     Mouth: Mucous membranes are moist.     Pharynx:  Oropharynx is clear.  Eyes:     Extraocular Movements: Extraocular movements intact.     Conjunctiva/sclera: Conjunctivae normal.     Pupils: Pupils are equal, round, and reactive to light.  Cardiovascular:     Rate and Rhythm: Normal rate and regular rhythm.     Pulses: Normal pulses.     Heart sounds: Normal heart sounds.  Pulmonary:     Effort: Pulmonary effort is normal.     Breath sounds: Normal breath sounds.  Musculoskeletal:     Cervical back: Normal range of motion and neck supple.  Skin:    General: Skin is warm and dry.     Capillary Refill: Capillary refill takes less than 2 seconds.  Neurological:     General: No focal deficit present.     Mental Status: She is alert and oriented to person, place, and time.  Psychiatric:  Mood and Affect: Mood normal.        Behavior: Behavior normal.     ASSESSMENT & PLAN: A total of 45 minutes was spent with the patient and counseling/coordination of care regarding preparing for this visit, review of most recent office visit notes, review of most recent blood work results, review of all medications and changes made, review of multiple chronic medical problems under management, discussion of morbid obesity and trial of Ozempic, diagnosis of chronic depression and need for psychiatric evaluation, education on nutrition, health maintenance items, cardiovascular risks associated with hypertension and obesity, management of chronic pain, chronic tinnitus of right ear and need for ENT evaluation, prognosis, documentation and need for follow-up.  Problem List Items Addressed This Visit       Cardiovascular and Mediastinum   Essential hypertension - Primary (Chronic)    Well-controlled hypertension. BP Readings from Last 3 Encounters:  07/01/21 124/80  04/10/21 126/84  02/20/21 130/80  Continue propranolol 60 mg, lisinopril 20 mg, and furosemide 20 mg daily. Dietary approaches to stop hypertension discussed.       Relevant  Medications   atorvastatin (LIPITOR) 20 MG tablet   Other Relevant Orders   Comprehensive metabolic panel   Chronic diastolic heart failure (HCC) (Chronic)    Stable.  Continue Lasix 20 mg daily.      Relevant Medications   atorvastatin (LIPITOR) 20 MG tablet   Pulmonary arterial hypertension (HCC)   Relevant Medications   atorvastatin (LIPITOR) 20 MG tablet     Respiratory   OSA (obstructive sleep apnea)    Stable.  On CPAP treatment nightly.        Digestive   Gastroesophageal reflux disease    Clinically stable.  Asymptomatic.  Dexilant not covered by insurance.  Will use pantoprazole 40 mg daily.      Relevant Medications   pantoprazole (PROTONIX) 40 MG tablet     Genitourinary   Chronic kidney disease    Lab Results  Component Value Date   CREATININE 0.98 09/26/2020   BUN 10 09/26/2020   NA 139 09/26/2020   K 4.4 09/26/2020   CL 104 09/26/2020   CO2 20 09/26/2020  CMP done today.         Other   Chronic pain syndrome    Stable.  On Cymbalta and gabapentin.      Fibromyalgia    Stable.  Continue Cymbalta.      Obesity, morbid, BMI 40.0-49.9 ([place])    Diet and nutrition discussed.  As requested by patient, will start weekly Ozempic at 0.5 mg and titrate as tolerated.      Relevant Medications   Semaglutide,0.25 or 0.5MG /DOS, (OZEMPIC, 0.25 OR 0.5 MG/DOSE,) 2 MG/1.5ML SOPN   Hyperlipidemia    Stable.  Continue atorvastatin 20 mg daily. The 10-year ASCVD risk score (Arnett DK, et al., 2019) is: 6%   Values used to calculate the score:     Age: 20 years     Sex: Female     Is Non-Hispanic African American: No     Diabetic: No     Tobacco smoker: No     Systolic Blood Pressure: 527 mmHg     Is BP treated: Yes     HDL Cholesterol: 63 mg/dL     Total Cholesterol: 167 mg/dL       Relevant Medications   atorvastatin (LIPITOR) 20 MG tablet   Depression    Chronic major depression, currently active.  Patient on Cymbalta and Zoloft.  Needs urgent  evaluation by psychiatrist.  Questionable past history of bipolar disorder.      Other Visit Diagnoses     Need for pneumococcal vaccination       Relevant Orders   Pneumococcal conjugate vaccine 20-valent (Prevnar 20)   Dyslipidemia       Relevant Medications   atorvastatin (LIPITOR) 20 MG tablet   Other Relevant Orders   Lipid panel   History of gastroesophageal reflux (GERD)       Relevant Medications   pantoprazole (PROTONIX) 40 MG tablet   Major depression, recurrent, chronic (HCC)       Relevant Orders   Ambulatory referral to Psychiatry   Morbid obesity ([place])       Relevant Medications   Semaglutide,0.25 or 0.5MG /DOS, (OZEMPIC, 0.25 OR 0.5 MG/DOSE,) 2 MG/1.5ML SOPN   Other Relevant Orders   CBC with Differential/Platelet   TSH   Prediabetes       Relevant Medications   Semaglutide,0.25 or 0.5MG /DOS, (OZEMPIC, 0.25 OR 0.5 MG/DOSE,) 2 MG/1.5ML SOPN   Other Relevant Orders   Hemoglobin A1c   Tinnitus of right ear       Relevant Orders   Ambulatory referral to ENT      Patient Instructions  Health Maintenance After Age 29 After age 50, you are at a higher risk for certain long-term diseases and infections as well as injuries from falls. Falls are a major cause of broken bones and head injuries in people who are older than age 4. Getting regular preventive care can help to keep you healthy and well. Preventive care includes getting regular testing and making lifestyle changes as recommended by your health care provider. Talk with your health care provider about: Which screenings and tests you should have. A screening is a test that checks for a disease when you have no symptoms. A diet and exercise plan that is right for you. What should I know about screenings and tests to prevent falls? Screening and testing are the best ways to find a health problem early. Early diagnosis and treatment give you the best chance of managing medical conditions that are common after age  84. Certain conditions and lifestyle choices may make you more likely to have a fall. Your health care provider may recommend: Regular vision checks. Poor vision and conditions such as cataracts can make you more likely to have a fall. If you wear glasses, make sure to get your prescription updated if your vision changes. Medicine review. Work with your health care provider to regularly review all of the medicines you are taking, including over-the-counter medicines. Ask your health care provider about any side effects that may make you more likely to have a fall. Tell your health care provider if any medicines that you take make you feel dizzy or sleepy. Strength and balance checks. Your health care provider may recommend certain tests to check your strength and balance while standing, walking, or changing positions. Foot health exam. Foot pain and numbness, as well as not wearing proper footwear, can make you more likely to have a fall. Screenings, including: Osteoporosis screening. Osteoporosis is a condition that causes the bones to get weaker and break more easily. Blood pressure screening. Blood pressure changes and medicines to control blood pressure can make you feel dizzy. Depression screening. You may be more likely to have a fall if you have a fear of falling, feel depressed, or feel unable to do activities that you used to do. Alcohol use screening.  Using too much alcohol can affect your balance and may make you more likely to have a fall. Follow these instructions at home: Lifestyle Do not drink alcohol if: Your health care provider tells you not to drink. If you drink alcohol: Limit how much you have to: 0-1 drink a day for women. 0-2 drinks a day for men. Know how much alcohol is in your drink. In the [place] one drink equals one 12 oz bottle of beer (355 mL), one 5 oz glass of wine (148 mL), or one 1 oz glass of hard liquor (44 mL). Do not use any products that contain nicotine or  tobacco. These products include cigarettes, chewing tobacco, and vaping devices, such as e-cigarettes. If you need help quitting, ask your health care provider. Activity  Follow a regular exercise program to stay fit. This will help you maintain your balance. Ask your health care provider what types of exercise are appropriate for you. If you need a cane or walker, use it as recommended by your health care provider. Wear supportive shoes that have nonskid soles. Safety  Remove any tripping hazards, such as rugs, cords, and clutter. Install safety equipment such as grab bars in bathrooms and safety rails on stairs. Keep rooms and walkways well-lit. General instructions Talk with your health care provider about your risks for falling. Tell your health care provider if: You fall. Be sure to tell your health care provider about all falls, even ones that seem minor. You feel dizzy, tiredness (fatigue), or off-balance. Take over-the-counter and prescription medicines only as told by your health care provider. These include supplements. Eat a healthy diet and maintain a healthy weight. A healthy diet includes low-fat dairy products, low-fat (lean) meats, and fiber from whole grains, beans, and lots of fruits and vegetables. Stay current with your vaccines. Schedule regular health, dental, and eye exams. Summary Having a healthy lifestyle and getting preventive care can help to protect your health and wellness after age 26. Screening and testing are the best way to find a health problem early and help you avoid having a fall. Early diagnosis and treatment give you the best chance for managing medical conditions that are more common for people who are older than age 43. Falls are a major cause of broken bones and head injuries in people who are older than age 7. Take precautions to prevent a fall at home. Work with your health care provider to learn what changes you can make to improve your health and  wellness and to prevent falls. This information is not intended to replace advice given to you by your health care provider. Make sure you discuss any questions you have with your health care provider. Document Revised: 12/10/2020 Document Reviewed: 12/10/2020 Elsevier Patient Education  2022 [place], MD [facility], [place]

## 2021-07-01 NOTE — Assessment & Plan Note (Signed)
Clinically stable.  Asymptomatic.  Dexilant not covered by insurance.  Will use pantoprazole 40 mg daily.

## 2021-07-01 NOTE — Assessment & Plan Note (Signed)
Stable.  Continue atorvastatin 20 mg daily. The 10-year ASCVD risk score (Arnett DK, et al., 2019) is: 6%   Values used to calculate the score:     Age: 65 years     Sex: Female     Is Non-Hispanic African American: No     Diabetic: No     Tobacco smoker: No     Systolic Blood Pressure: 794 mmHg     Is BP treated: Yes     HDL Cholesterol: 63 mg/dL     Total Cholesterol: 167 mg/dL

## 2021-07-01 NOTE — Patient Instructions (Signed)
Health Maintenance After Age 65 After age 65, you are at a higher risk for certain long-term diseases and infections as well as injuries from falls. Falls are a major cause of broken bones and head injuries in people who are older than age 65. Getting regular preventive care can help to keep you healthy and well. Preventive care includes getting regular testing and making lifestyle changes as recommended by your health care provider. Talk with your health care provider about: Which screenings and tests you should have. A screening is a test that checks for a disease when you have no symptoms. A diet and exercise plan that is right for you. What should I know about screenings and tests to prevent falls? Screening and testing are the best ways to find a health problem early. Early diagnosis and treatment give you the best chance of managing medical conditions that are common after age 65. Certain conditions and lifestyle choices may make you more likely to have a fall. Your health care provider may recommend: Regular vision checks. Poor vision and conditions such as cataracts can make you more likely to have a fall. If you wear glasses, make sure to get your prescription updated if your vision changes. Medicine review. Work with your health care provider to regularly review all of the medicines you are taking, including over-the-counter medicines. Ask your health care provider about any side effects that may make you more likely to have a fall. Tell your health care provider if any medicines that you take make you feel dizzy or sleepy. Strength and balance checks. Your health care provider may recommend certain tests to check your strength and balance while standing, walking, or changing positions. Foot health exam. Foot pain and numbness, as well as not wearing proper footwear, can make you more likely to have a fall. Screenings, including: Osteoporosis screening. Osteoporosis is a condition that causes  the bones to get weaker and break more easily. Blood pressure screening. Blood pressure changes and medicines to control blood pressure can make you feel dizzy. Depression screening. You may be more likely to have a fall if you have a fear of falling, feel depressed, or feel unable to do activities that you used to do. Alcohol use screening. Using too much alcohol can affect your balance and may make you more likely to have a fall. Follow these instructions at home: Lifestyle Do not drink alcohol if: Your health care provider tells you not to drink. If you drink alcohol: Limit how much you have to: 0-1 drink a day for women. 0-2 drinks a day for men. Know how much alcohol is in your drink. In the [place] one drink equals one 12 oz bottle of beer (355 mL), one 5 oz glass of wine (148 mL), or one 1 oz glass of hard liquor (44 mL). Do not use any products that contain nicotine or tobacco. These products include cigarettes, chewing tobacco, and vaping devices, such as e-cigarettes. If you need help quitting, ask your health care provider. Activity  Follow a regular exercise program to stay fit. This will help you maintain your balance. Ask your health care provider what types of exercise are appropriate for you. If you need a cane or walker, use it as recommended by your health care provider. Wear supportive shoes that have nonskid soles. Safety  Remove any tripping hazards, such as rugs, cords, and clutter. Install safety equipment such as grab bars in bathrooms and safety rails on stairs. Keep rooms and walkways   well-lit. General instructions Talk with your health care provider about your risks for falling. Tell your health care provider if: You fall. Be sure to tell your health care provider about all falls, even ones that seem minor. You feel dizzy, tiredness (fatigue), or off-balance. Take over-the-counter and prescription medicines only as told by your health care provider. These include  supplements. Eat a healthy diet and maintain a healthy weight. A healthy diet includes low-fat dairy products, low-fat (lean) meats, and fiber from whole grains, beans, and lots of fruits and vegetables. Stay current with your vaccines. Schedule regular health, dental, and eye exams. Summary Having a healthy lifestyle and getting preventive care can help to protect your health and wellness after age 65. Screening and testing are the best way to find a health problem early and help you avoid having a fall. Early diagnosis and treatment give you the best chance for managing medical conditions that are more common for people who are older than age 65. Falls are a major cause of broken bones and head injuries in people who are older than age 65. Take precautions to prevent a fall at home. Work with your health care provider to learn what changes you can make to improve your health and wellness and to prevent falls. This information is not intended to replace advice given to you by your health care provider. Make sure you discuss any questions you have with your health care provider. Document Revised: 12/10/2020 Document Reviewed: 12/10/2020 Elsevier Patient Education  2022 Elsevier Inc.  

## 2021-07-01 NOTE — Assessment & Plan Note (Signed)
Chronic major depression, currently active.  Patient on Cymbalta and Zoloft.  Needs urgent evaluation by psychiatrist.  Questionable past history of bipolar disorder.

## 2021-07-01 NOTE — Assessment & Plan Note (Signed)
Stable.  Continue Lasix 20 mg daily.

## 2021-07-08 ENCOUNTER — Other Ambulatory Visit (HOSPITAL_COMMUNITY): Payer: Self-pay

## 2021-07-08 ENCOUNTER — Other Ambulatory Visit: Payer: Self-pay | Admitting: Emergency Medicine

## 2021-07-08 DIAGNOSIS — M62838 Other muscle spasm: Secondary | ICD-10-CM

## 2021-07-08 DIAGNOSIS — F411 Generalized anxiety disorder: Secondary | ICD-10-CM

## 2021-07-08 MED ORDER — CYCLOBENZAPRINE HCL 10 MG PO TABS
10.0000 mg | ORAL_TABLET | Freq: Three times a day (TID) | ORAL | 1 refills | Status: DC | PRN
  Filled 2021-07-08: qty 180, 60d supply, fill #0
  Filled 2021-10-07: qty 180, 60d supply, fill #1

## 2021-07-08 MED ORDER — BUSPIRONE HCL 7.5 MG PO TABS
7.5000 mg | ORAL_TABLET | Freq: Two times a day (BID) | ORAL | 1 refills | Status: DC
  Filled 2021-07-08: qty 180, 90d supply, fill #0
  Filled 2021-10-22: qty 180, 90d supply, fill #1

## 2021-07-18 LAB — HM HEPATITIS C SCREENING LAB: HM Hepatitis Screen: NEGATIVE

## 2021-07-24 ENCOUNTER — Other Ambulatory Visit (HOSPITAL_COMMUNITY): Payer: Self-pay

## 2021-08-02 ENCOUNTER — Encounter: Payer: Self-pay | Admitting: Emergency Medicine

## 2021-08-22 ENCOUNTER — Other Ambulatory Visit: Payer: Self-pay | Admitting: Emergency Medicine

## 2021-08-22 ENCOUNTER — Other Ambulatory Visit (HOSPITAL_COMMUNITY): Payer: Self-pay

## 2021-08-22 DIAGNOSIS — F32A Depression, unspecified: Secondary | ICD-10-CM

## 2021-08-22 DIAGNOSIS — I10 Essential (primary) hypertension: Secondary | ICD-10-CM

## 2021-08-22 DIAGNOSIS — R110 Nausea: Secondary | ICD-10-CM

## 2021-08-22 DIAGNOSIS — M797 Fibromyalgia: Secondary | ICD-10-CM

## 2021-08-22 MED ORDER — SERTRALINE HCL 100 MG PO TABS
100.0000 mg | ORAL_TABLET | Freq: Every day | ORAL | 1 refills | Status: DC
  Filled 2021-08-22: qty 90, 90d supply, fill #0

## 2021-08-22 MED ORDER — DULOXETINE HCL 60 MG PO CPEP
60.0000 mg | ORAL_CAPSULE | Freq: Every day | ORAL | 1 refills | Status: DC
  Filled 2021-08-22: qty 90, 90d supply, fill #0
  Filled 2021-11-21: qty 90, 90d supply, fill #1

## 2021-08-22 MED ORDER — LISINOPRIL 20 MG PO TABS
20.0000 mg | ORAL_TABLET | Freq: Every day | ORAL | 1 refills | Status: DC
Start: 2021-08-22 — End: 2022-03-26
  Filled 2021-08-22: qty 90, 90d supply, fill #0
  Filled 2021-12-16: qty 90, 90d supply, fill #1

## 2021-08-22 MED ORDER — ONDANSETRON 4 MG PO TBDP
4.0000 mg | ORAL_TABLET | Freq: Three times a day (TID) | ORAL | 4 refills | Status: DC | PRN
  Filled 2021-08-22: qty 20, 7d supply, fill #0
  Filled 2021-10-07: qty 20, 7d supply, fill #1
  Filled 2021-11-21: qty 20, 7d supply, fill #2
  Filled 2021-12-16: qty 20, 7d supply, fill #3
  Filled 2022-01-28: qty 20, 7d supply, fill #4

## 2021-09-02 ENCOUNTER — Ambulatory Visit: Payer: 59 | Admitting: Emergency Medicine

## 2021-09-13 ENCOUNTER — Other Ambulatory Visit (HOSPITAL_COMMUNITY): Payer: Self-pay

## 2021-09-20 ENCOUNTER — Other Ambulatory Visit (HOSPITAL_COMMUNITY): Payer: Self-pay

## 2021-09-23 DIAGNOSIS — H9313 Tinnitus, bilateral: Secondary | ICD-10-CM | POA: Diagnosis not present

## 2021-09-23 DIAGNOSIS — H903 Sensorineural hearing loss, bilateral: Secondary | ICD-10-CM | POA: Diagnosis not present

## 2021-10-01 ENCOUNTER — Encounter: Payer: Self-pay | Admitting: Emergency Medicine

## 2021-10-01 ENCOUNTER — Other Ambulatory Visit: Payer: Self-pay

## 2021-10-01 ENCOUNTER — Ambulatory Visit (INDEPENDENT_AMBULATORY_CARE_PROVIDER_SITE_OTHER): Payer: 59 | Admitting: Emergency Medicine

## 2021-10-01 ENCOUNTER — Other Ambulatory Visit (HOSPITAL_COMMUNITY): Payer: Self-pay

## 2021-10-01 ENCOUNTER — Encounter: Payer: Self-pay | Admitting: Oncology

## 2021-10-01 VITALS — BP 132/82 | HR 69 | Temp 98.5°F | Ht 64.0 in | Wt 226.0 lb

## 2021-10-01 DIAGNOSIS — I5032 Chronic diastolic (congestive) heart failure: Secondary | ICD-10-CM

## 2021-10-01 DIAGNOSIS — F339 Major depressive disorder, recurrent, unspecified: Secondary | ICD-10-CM | POA: Diagnosis not present

## 2021-10-01 DIAGNOSIS — I10 Essential (primary) hypertension: Secondary | ICD-10-CM

## 2021-10-01 DIAGNOSIS — F331 Major depressive disorder, recurrent, moderate: Secondary | ICD-10-CM | POA: Insufficient documentation

## 2021-10-01 DIAGNOSIS — K219 Gastro-esophageal reflux disease without esophagitis: Secondary | ICD-10-CM

## 2021-10-01 MED ORDER — VORTIOXETINE HBR 10 MG PO TABS
10.0000 mg | ORAL_TABLET | Freq: Every day | ORAL | 1 refills | Status: DC
  Filled 2021-10-01: qty 90, 90d supply, fill #0
  Filled 2022-01-03: qty 90, 90d supply, fill #1

## 2021-10-01 NOTE — Assessment & Plan Note (Signed)
Well-controlled hypertension. BP Readings from Last 3 Encounters:  10/01/21 132/82  07/01/21 124/80  04/10/21 126/84  Continue lisinopril 20 mg and propranolol 60 mg 3 times a day.

## 2021-10-01 NOTE — Assessment & Plan Note (Signed)
Stable.  No signs or symptoms of acute CHF.

## 2021-10-01 NOTE — Assessment & Plan Note (Signed)
History of chronic depression.  Currently active and affecting quality of life. We will continue BuSpar and Cymbalta.  Discontinue Zoloft and start Trintellix 10 mg daily.  New psychiatric referral placed today.

## 2021-10-01 NOTE — Progress Notes (Signed)
[name] 66 y.o.   Chief Complaint  Patient presents with   Follow-up    Chronic medication conditions    HISTORY OF PRESENT ILLNESS: This is a 66 y.o. female here for follow-up of chronic medical conditions. Mostly complaining of depression.  Feels she needs something else.  Has been unable to schedule appointment with psychiatrist. Presently taking Zoloft, Cymbalta, and BuSpar. No other acute complaints or medical concerns today.  HPI   Prior to Admission medications   Medication Sig Start Date End Date Taking? Authorizing Provider  albuterol (PROAIR HFA) 108 (90 Base) MCG/ACT inhaler Inhale 1-2 puffs into the lungs every 4 (four) hours as needed for wheezing or shortness of breath. 12/25/20  Yes Harrol Novello, Ines Bloomer, MD  atorvastatin (LIPITOR) 20 MG tablet Take 1 tablet (20 mg total) by mouth daily. 07/01/21  Yes Dorsey Authement, Ines Bloomer, MD  busPIRone (BUSPAR) 7.5 MG tablet Take 1 tablet (7.5 mg total) by mouth 2 (two) times daily. 07/08/21  Yes Sharnell Knight, Ines Bloomer, MD  butalbital-acetaminophen-caffeine (FIORICET) [phone] MG tablet Take 1 tablet by mouth every 6 (six) hours as needed for headache. 12/25/20  Yes Matai Carpenito, Ines Bloomer, MD  COVID-19 mRNA bivalent vaccine, Pfizer, (PFIZER COVID-19 VAC BIVALENT) injection Inject into the muscle. 05/30/21  Yes Carlyle Basques, MD  cyclobenzaprine (FLEXERIL) 10 MG tablet Take 1 tablet (10 mg total) by mouth 3 (three) times daily as needed for muscle spasms. 07/08/21  Yes Kadi Hession, Ines Bloomer, MD  DULoxetine (CYMBALTA) 60 MG capsule Take 1 capsule (60 mg total) by mouth daily. 08/22/21  Yes Donise Woodle, Ines Bloomer, MD  fluticasone [facility]) 50 MCG/ACT nasal spray Place 1 spray into both nostrils daily. 12/25/20  Yes Olivette Beckmann, Ines Bloomer, MD  gabapentin (NEURONTIN) 300 MG capsule Take 1-2 capsules (300-600 mg total) by mouth 4 (four) times daily. 02/20/21  Yes Darwyn Ponzo, Ines Bloomer, MD  guaiFENesin (ROBITUSSIN) 100 MG/5ML SOLN Take 5 mLs  (100 mg total) by mouth every 4 (four) hours as needed for cough or to loosen phlegm. 01/18/21  Yes Montine Circle, PA-C  influenza vaccine adjuvanted (FLUAD) 0.5 ML injection Inject into the muscle. 05/30/21  Yes Carlyle Basques, MD  lisinopril (ZESTRIL) 20 MG tablet Take 1 tablet (20 mg total) by mouth daily. 08/22/21  Yes Emanuel Dowson, Ines Bloomer, MD  ondansetron (ZOFRAN-ODT) 4 MG disintegrating tablet Dissolve 1 tablet (4 mg total) by mouth every 8 (eight) hours as needed for nausea or vomiting. 08/22/21  Yes Graysin Luczynski, Ines Bloomer, MD  propranolol (INDERAL) 60 MG tablet Take 1 tablet (60 mg total) by mouth 3 (three) times daily. 06/05/21  Yes Leonie Man, MD  Semaglutide,0.25 or 0.5MG /DOS, (OZEMPIC, 0.25 OR 0.5 MG/DOSE,) 2 MG/1.5ML SOPN Inject 0.5 mg into the skin once a week. 07/01/21  Yes Christiann Hagerty, Ines Bloomer, MD  sertraline (ZOLOFT) 100 MG tablet Take 1 tablet (100 mg total) by mouth daily. 08/22/21  Yes Lavaughn Bisig, Ines Bloomer, MD  topiramate (TOPAMAX) 50 MG tablet Take 1 tablet (50 mg total) by mouth 2 (two) times daily. 05/06/21  Yes Tuwanna Krausz, Ines Bloomer, MD  furosemide (LASIX) 20 MG tablet Take 1 tablet (20 mg total) by mouth 2 (two) times daily as needed. Patient not taking: Reported on 10/01/2021 09/13/19   Jacelyn Pi, Lilia Argue, MD  pantoprazole (PROTONIX) 40 MG tablet Take 1 tablet (40 mg total) by mouth daily. 07/01/21 09/29/21  Horald Pollen, MD  valACYclovir (VALTREX) 1000 MG tablet Take 2 tablets (2000 mg) initally, then 1 tablet (1000 mg) every 12  hours until resolved, no more than 10 days Patient not taking: Reported on 10/01/2021 01/11/21   Mar Daring, PA-C    Allergies  Allergen Reactions   Sulfa Antibiotics Itching    Patient Active Problem List   Diagnosis Date Noted   Hx of adenomatous colonic polyps 01/02/2021   Pulmonary arterial hypertension ([place]) 07/04/2020   Kidney cysts 07/04/2020   Hyperlipidemia 07/04/2020   Depression 07/04/2020   Iron  deficiency anemia 10/13/2018   DOE (dyspnea on exertion) 03/23/2018   Obesity, morbid, BMI 40.0-49.9 ([place]) 03/23/2018   Chronic diastolic heart failure ([place]) 03/23/2018   Grade I diastolic dysfunction 08/06/7251   Migraine without aura and without status migrainosus, not intractable 03/02/2017   Class 3 severe obesity due to excess calories with serious comorbidity and body mass index (BMI) of 40.0 to 44.9 in adult ([place]) 01/13/2017   Hiatal hernia 10/28/2016   Pure hypercholesterolemia 10/28/2016   Chronic kidney disease 06/01/2015   Gastroesophageal reflux disease 06/01/2015   Goiter 06/01/2015   Essential hypertension 06/01/2015   Chronic pain syndrome 05/28/2015   Fibromyalgia 05/28/2015   OSA (obstructive sleep apnea) 05/28/2015   Adjustment disorder with mixed anxiety and depressed mood 05/28/2015    Past Medical History:  Diagnosis Date   Allergy    [name], Flonase   Anemia    Anxiety    Arthritis    Chronic kidney disease    Colon polyps    Depression    Fibromyalgia    Gastritis    GERD (gastroesophageal reflux disease)    Hernia, hiatal    High risk for colon cancer    HLD (hyperlipidemia)    Hypertension    Migraine    Neuropathy    Osteoporosis    Pneumonia    Sleep apnea    c-pap nightly   Sleep apnea with use of continuous positive airway pressure (CPAP)    Thyroid goiter     Past Surgical History:  Procedure Laterality Date   48-Hour Holter Monitor  06/2017   Mostly normal sinus rhythm.  Average heart rate 71 bpm.  Minimum 54 bpm.  Maximum sinus rate 114 bpm.  Rare PACs and PVCs.  No A. fib, atrial flutter, SVT or VT.  One brief run of 8 beat PAT.   ABDOMINAL HYSTERECTOMY     ovaries intact; DUB.  No dysplasia.   FRACTURE SURGERY N/A    Phreesia 09/25/2020   JOINT REPLACEMENT     OPEN REDUCTION INTERNAL FIXATION (ORIF) DISTAL RADIAL FRACTURE Right 07/09/2017   Procedure: RIGHT WRIST OPEN REDUCTION INTERNAL FIXATION (ORIF) DISTAL RADIAL FRACTURE  AND REPAIR AS INDICATED;  Surgeon: Iran Planas, MD;  Location: [place];  Service: Orthopedics;  Laterality: Right;   PARTIAL HYSTERECTOMY     ROTATOR CUFF REPAIR Right 2009   SHOULDER SURGERY Right    shoulder dislocation, fall, and elbow unla nerve   SHOULDER SURGERY Right 2015   labral tear   TRANSTHORACIC ECHOCARDIOGRAM  06/2017    EF 55-60%.  grade 1 diastolic dysfunction.  Otherwise normal   TUBAL LIGATION     ULNAR NERVE REPAIR Right 08/19/2014    Social History   Socioeconomic History   Marital status: Married    Spouse name: Not on file   Number of children: 3   Years of education: Not on file   Highest education level: Not on file  Occupational History   Occupation: unemployed  Tobacco Use   Smoking status: Former    Types:  Cigarettes    Quit date: 08/04/1998    Years since quitting: 23.1   Smokeless tobacco: Never  Vaping Use   Vaping Use: Never used  Substance and Sexual Activity   Alcohol use: No   Drug use: No   Sexual activity: Yes    Birth control/protection: Surgical, Post-menopausal  Other Topics Concern   Not on file  Social History Narrative   Marital status: married x 20 years; moderately happy     Children: 3 children (47 [name], [place], 40 April); 8 grandchildren; 0 gg     Lives: with husband/Steve, April, 2 granddaughters     Employment:  Unemployed; quit working 2015 with work related injury; awaiting disability in 2018; disability approved in 02/2017.       Tobacco: quit smoking in 2016; smoked x 2 years.      Alcohol: none      Drugs: none      Exercise: rarely in 2018         Social Determinants of Health   Financial Resource Strain: Not on file  Food Insecurity: Not on file  Transportation Needs: Not on file  Physical Activity: Not on file  Stress: Not on file  Social Connections: Not on file  Intimate Partner Violence: Not on file    Family History  Problem Relation Age of Onset   Heart disease Mother 69       AMI/CAD/CHF as  cause of death   Hypertension Mother    Hyperlipidemia Mother    Hypothyroidism Mother    Depression Mother    Gout Mother    Heart disease Father 26       AMI   Hypertension Father    Stroke Father 53       mild CVA   Prostate cancer Father    Hyperlipidemia Father    Cancer Father 36       prostate cancer   Hypertension Brother    Hyperlipidemia Brother    Cancer Brother        prostate cancer   Cancer Maternal Grandmother        type unknown   Cancer Maternal Grandfather        type unknown   Heart disease Paternal Grandmother    Heart defect Paternal Grandfather    Melanoma Brother    Hyperlipidemia Brother    Hypertension Brother    Cancer Brother        melanoma   Hypertension Brother    Hyperlipidemia Brother    Melanoma Brother    Cancer Brother 39       melanoma   Cancer Sister        Basal cell carcinoma scalp   Depression Sister    Hypertension Brother    Depression Brother    Hypertension Brother    Gout Brother    Arthritis Sister    Depression Sister    Colon cancer Neg Hx    Esophageal cancer Neg Hx    Stomach cancer Neg Hx    Rectal cancer Neg Hx      Review of Systems  Constitutional: Negative.  Negative for chills and fever.  HENT:  Negative for congestion and sore throat.   Respiratory: Negative.  Negative for cough and shortness of breath.   Cardiovascular: Negative.  Negative for chest pain and palpitations.  Gastrointestinal:  Negative for abdominal pain, nausea and vomiting.  Genitourinary: Negative.  Negative for dysuria and hematuria.  Skin: Negative.  Negative for  rash.  Neurological:  Negative for dizziness and headaches.  Psychiatric/Behavioral:  Positive for depression.   All other systems reviewed and are negative.  Today's Vitals   10/01/21 1324  BP: 132/82  Pulse: 69  Temp: 98.5 F (36.9 C)  TempSrc: Oral  SpO2: 94%  Weight: 226 lb (102.5 kg)  Height: 5\' 4"  (1.626 m)   Body mass index is 38.79 kg/m. Wt  Readings from Last 3 Encounters:  10/01/21 226 lb (102.5 kg)  07/01/21 243 lb (110.2 kg)  04/10/21 240 lb (108.9 kg)    Physical Exam Vitals reviewed.  Constitutional:      Appearance: Normal appearance.  HENT:     Head: Normocephalic.  Eyes:     Extraocular Movements: Extraocular movements intact.     Pupils: Pupils are equal, round, and reactive to light.  Cardiovascular:     Rate and Rhythm: Normal rate and regular rhythm.     Pulses: Normal pulses.     Heart sounds: Normal heart sounds.  Pulmonary:     Effort: Pulmonary effort is normal.     Breath sounds: Normal breath sounds.  Musculoskeletal:     Cervical back: No tenderness.     Right lower leg: No edema.     Left lower leg: No edema.  Lymphadenopathy:     Cervical: No cervical adenopathy.  Skin:    General: Skin is warm and dry.     Capillary Refill: Capillary refill takes less than 2 seconds.  Neurological:     General: No focal deficit present.     Mental Status: She is alert and oriented to person, place, and time.  Psychiatric:        Mood and Affect: Mood normal.        Behavior: Behavior normal.     ASSESSMENT & PLAN: A total of 47 minutes was spent with the patient and counseling/coordination of care regarding preparing for this visit, review of most recent office visit notes, review of all medications and changes made, diagnosis of active depression and management, reviewed most recent blood work results, review of multiple chronic medical problems and their management, prognosis, documentation and need for follow-up.  Problem List Items Addressed This Visit       Cardiovascular and Mediastinum   Essential hypertension (Chronic)    Well-controlled hypertension. BP Readings from Last 3 Encounters:  10/01/21 132/82  07/01/21 124/80  04/10/21 126/84  Continue lisinopril 20 mg and propranolol 60 mg 3 times a day.       Chronic diastolic heart failure (HCC) (Chronic)    Stable.  No signs or  symptoms of acute CHF.        Digestive   Gastroesophageal reflux disease    Stable and asymptomatic.  Continue Protonix 40 mg daily.        Other   Obesity, morbid, BMI 40.0-49.9 ([place])    Eating better and losing weight.  Continue Ozempic 0.5 mg weekly.      Major depression, recurrent, chronic (HCC) - Primary    History of chronic depression.  Currently active and affecting quality of life. We will continue BuSpar and Cymbalta.  Discontinue Zoloft and start Trintellix 10 mg daily.  New psychiatric referral placed today.      Relevant Medications   vortioxetine HBr (TRINTELLIX) 10 MG TABS tablet   Other Relevant Orders   Ambulatory referral to Psychiatry   Patient Instructions  Major Depressive Disorder, Adult Major depressive disorder is a mental health condition. This disorder  affects feelings. It can also affect the body. Symptoms of this condition last most of the day, almost every day, for 2 weeks. This disorder can affect: Relationships. Daily activities, such as work and school. Activities that you normally like to do. What are the causes? The cause of this condition is not known. The disorder is likely caused by a mix of things, including: Your personality, such as being a shy person. Your behavior, or how you act toward others. Your thoughts and feelings. Too much alcohol or drugs. How you react to stress. Health and mental problems that you have had for a long time. Things that hurt you in the past (trauma). Big changes in your life, such as divorce. What increases the risk? The following factors may make you more likely to develop this condition: Having family members with depression. Being a woman. Problems in the family. Low levels of some brain chemicals. Things that caused you pain as a child, especially if you lost a parent or were abused. A lot of stress in your life, such as from: Living without basic needs of life, such as food and shelter. Being  treated poorly because of race, sex, or religion (discrimination). Health and mental problems that you have had for a long time. What are the signs or symptoms? The main symptoms of this condition are: Being sad all the time. Being grouchy all the time. Loss of interest in things and activities. Other symptoms include: Sleeping too much or too little. Eating too much or too little. Gaining or losing weight, without knowing why. Feeling tired or having low energy. Being restless and weak. Feeling hopeless, worthless, or guilty. Trouble thinking clearly or making decisions. Thoughts of hurting yourself or others, or thoughts of ending your life. Spending a lot of time alone. Inability to complete common tasks of daily life. If you have very bad MDD, you may: Believe things that are not true. Hear, see, taste, or feel things that are not there. Have mild depression that lasts for at least 2 years. Feel very sad and hopeless. Have trouble speaking or moving. How is this treated? This condition may be treated with: Talk therapy. This teaches you to know bad thoughts, feelings, and actions and how to change them. This can also help you to communicate with others. This can be done with members of your family. Medicines. These can be used to treat worry (anxiety), depression, or low levels of chemicals in the brain. Lifestyle changes. You may need to: Limit alcohol use. Limit drug use. Get regular exercise. Get plenty of sleep. Make healthy eating choices. Spend more time outdoors. Brain stimulation. This treatment excites the brain. This is done when symptoms are very bad or have not gotten better with other treatments. Follow these instructions at home: Activity Get regular exercise as told. Spend time outdoors as told. Make time to do the things you enjoy. Find ways to deal with stress. Try to: Meditate. Do deep breathing. Spend time in nature. Keep a journal. Return to  your normal activities as told by your doctor. Ask your doctor what activities are safe for you. Alcohol and drug use If you drink alcohol: Limit how much you use to: 0-1 drink a day for women. 0-2 drinks a day for men. Be aware of how much alcohol is in your drink. In the [place] one drink equals one 12 oz bottle of beer (355 mL), one 5 oz glass of wine (148 mL), or one 1 oz glass  of hard liquor (44 mL). Talk to your doctor about: Alcohol use. Alcohol can affect some medicines. Any drug use. General instructions  Take over-the-counter and prescription medicines and herbal preparations only as told by your doctor. Eat a healthy diet. Get a lot of sleep. Think about joining a support group. Your doctor may be able to suggest one. Keep all follow-up visits as told by your doctor. This is important. Where to find more information: [employer] on Mental Illness: www.nami.[place]: https://carter.com/ American Psychiatric Association: www.psychiatry.org/patients-families/ Contact a doctor if: Your symptoms get worse. You get new symptoms. Get help right away if: You hurt yourself. You have serious thoughts about hurting yourself or others. You see, hear, taste, smell, or feel things that are not there. If you ever feel like you may hurt yourself or others, or have thoughts about taking your own life, get help right away. Go to your nearest emergency department or: Call your local emergency services (911 in the [place] Call a suicide crisis helpline, such as the [facility] at 607-061-4655 or 988 in the [place]. This is open 24 hours a day in the [place] Text the Crisis Text Line at 559-841-4076 (in the [place].). Summary Major depressive disorder is a mental health condition. This disorder affects feelings. Symptoms of this condition last most of the day, almost every day, for 2 weeks. The symptoms of this disorder can cause problems with  relationships and with daily activities. There are treatments and support for people who get this disorder. You may need more than one type of treatment. Get help right away if you have serious thoughts about hurting yourself or others. This information is not intended to replace advice given to you by your health care provider. Make sure you discuss any questions you have with your health care provider. Document Revised: 02/13/2021 Document Reviewed: 07/02/2019 Elsevier Patient Education  2022 [place], MD [facility]

## 2021-10-01 NOTE — Assessment & Plan Note (Signed)
Stable and asymptomatic. Continue Protonix 40 mg daily. 

## 2021-10-01 NOTE — Assessment & Plan Note (Signed)
Eating better and losing weight.  Continue Ozempic 0.5 mg weekly.

## 2021-10-01 NOTE — Patient Instructions (Signed)
Major Depressive Disorder, Adult Major depressive disorder is a mental health condition. This disorder affects feelings. It can also affect the body. Symptoms of this condition last most of the day, almost every day, for 2 weeks. This disorder can affect: Relationships. Daily activities, such as work and school. Activities that you normally like to do. What are the causes? The cause of this condition is not known. The disorder is likely caused by a mix of things, including: Your personality, such as being a shy person. Your behavior, or how you act toward others. Your thoughts and feelings. Too much alcohol or drugs. How you react to stress. Health and mental problems that you have had for a long time. Things that hurt you in the past (trauma). Big changes in your life, such as divorce. What increases the risk? The following factors may make you more likely to develop this condition: Having family members with depression. Being a woman. Problems in the family. Low levels of some brain chemicals. Things that caused you pain as a child, especially if you lost a parent or were abused. A lot of stress in your life, such as from: Living without basic needs of life, such as food and shelter. Being treated poorly because of race, sex, or religion (discrimination). Health and mental problems that you have had for a long time. What are the signs or symptoms? The main symptoms of this condition are: Being sad all the time. Being grouchy all the time. Loss of interest in things and activities. Other symptoms include: Sleeping too much or too little. Eating too much or too little. Gaining or losing weight, without knowing why. Feeling tired or having low energy. Being restless and weak. Feeling hopeless, worthless, or guilty. Trouble thinking clearly or making decisions. Thoughts of hurting yourself or others, or thoughts of ending your life. Spending a lot of time alone. Inability to  complete common tasks of daily life. If you have very bad MDD, you may: Believe things that are not true. Hear, see, taste, or feel things that are not there. Have mild depression that lasts for at least 2 years. Feel very sad and hopeless. Have trouble speaking or moving. How is this treated? This condition may be treated with: Talk therapy. This teaches you to know bad thoughts, feelings, and actions and how to change them. This can also help you to communicate with others. This can be done with members of your family. Medicines. These can be used to treat worry (anxiety), depression, or low levels of chemicals in the brain. Lifestyle changes. You may need to: Limit alcohol use. Limit drug use. Get regular exercise. Get plenty of sleep. Make healthy eating choices. Spend more time outdoors. Brain stimulation. This treatment excites the brain. This is done when symptoms are very bad or have not gotten better with other treatments. Follow these instructions at home: Activity Get regular exercise as told. Spend time outdoors as told. Make time to do the things you enjoy. Find ways to deal with stress. Try to: Meditate. Do deep breathing. Spend time in nature. Keep a journal. Return to your normal activities as told by your doctor. Ask your doctor what activities are safe for you. Alcohol and drug use If you drink alcohol: Limit how much you use to: 0-1 drink a day for women. 0-2 drinks a day for men. Be aware of how much alcohol is in your drink. In the [place] one drink equals one 12 oz bottle of beer (355 mL),  one 5 oz glass of wine (148 mL), or one 1 oz glass of hard liquor (44 mL). Talk to your doctor about: Alcohol use. Alcohol can affect some medicines. Any drug use. General instructions  Take over-the-counter and prescription medicines and herbal preparations only as told by your doctor. Eat a healthy diet. Get a lot of sleep. Think about joining a support group.  Your doctor may be able to suggest one. Keep all follow-up visits as told by your doctor. This is important. Where to find more information: [employer] on Mental Illness: www.nami.[place]: https://carter.com/ American Psychiatric Association: www.psychiatry.org/patients-families/ Contact a doctor if: Your symptoms get worse. You get new symptoms. Get help right away if: You hurt yourself. You have serious thoughts about hurting yourself or others. You see, hear, taste, smell, or feel things that are not there. If you ever feel like you may hurt yourself or others, or have thoughts about taking your own life, get help right away. Go to your nearest emergency department or: Call your local emergency services (911 in the [place] Call a suicide crisis helpline, such as the [facility] at 956-029-6871 or 988 in the [place]. This is open 24 hours a day in the [place] Text the Crisis Text Line at 867 725 3908 (in the [place].). Summary Major depressive disorder is a mental health condition. This disorder affects feelings. Symptoms of this condition last most of the day, almost every day, for 2 weeks. The symptoms of this disorder can cause problems with relationships and with daily activities. There are treatments and support for people who get this disorder. You may need more than one type of treatment. Get help right away if you have serious thoughts about hurting yourself or others. This information is not intended to replace advice given to you by your health care provider. Make sure you discuss any questions you have with your health care provider. Document Revised: 02/13/2021 Document Reviewed: 07/02/2019 Elsevier Patient Education  2022 [employer].

## 2021-10-07 ENCOUNTER — Other Ambulatory Visit (HOSPITAL_COMMUNITY): Payer: Self-pay

## 2021-10-07 ENCOUNTER — Other Ambulatory Visit: Payer: Self-pay | Admitting: Emergency Medicine

## 2021-10-07 DIAGNOSIS — M797 Fibromyalgia: Secondary | ICD-10-CM

## 2021-10-07 DIAGNOSIS — M792 Neuralgia and neuritis, unspecified: Secondary | ICD-10-CM

## 2021-10-07 MED ORDER — GABAPENTIN 300 MG PO CAPS
300.0000 mg | ORAL_CAPSULE | Freq: Four times a day (QID) | ORAL | 1 refills | Status: DC
  Filled 2021-10-07: qty 540, 68d supply, fill #0
  Filled 2022-01-03: qty 540, 68d supply, fill #1

## 2021-10-14 ENCOUNTER — Encounter: Payer: Self-pay | Admitting: Emergency Medicine

## 2021-10-22 ENCOUNTER — Other Ambulatory Visit (HOSPITAL_COMMUNITY): Payer: Self-pay

## 2021-10-30 DIAGNOSIS — H903 Sensorineural hearing loss, bilateral: Secondary | ICD-10-CM | POA: Diagnosis not present

## 2021-11-18 ENCOUNTER — Other Ambulatory Visit (HOSPITAL_COMMUNITY): Payer: Self-pay

## 2021-11-18 ENCOUNTER — Ambulatory Visit (INDEPENDENT_AMBULATORY_CARE_PROVIDER_SITE_OTHER): Payer: 59 | Admitting: Emergency Medicine

## 2021-11-18 ENCOUNTER — Encounter: Payer: Self-pay | Admitting: Oncology

## 2021-11-18 ENCOUNTER — Encounter: Payer: Self-pay | Admitting: Emergency Medicine

## 2021-11-18 DIAGNOSIS — N1831 Chronic kidney disease, stage 3a: Secondary | ICD-10-CM

## 2021-11-18 DIAGNOSIS — E785 Hyperlipidemia, unspecified: Secondary | ICD-10-CM | POA: Diagnosis not present

## 2021-11-18 DIAGNOSIS — R7303 Prediabetes: Secondary | ICD-10-CM | POA: Diagnosis not present

## 2021-11-18 DIAGNOSIS — I10 Essential (primary) hypertension: Secondary | ICD-10-CM | POA: Diagnosis not present

## 2021-11-18 MED ORDER — WEGOVY 1 MG/0.5ML ~~LOC~~ SOAJ
1.0000 mg | SUBCUTANEOUS | 7 refills | Status: DC
  Filled 2021-11-18 – 2021-11-21 (×2): qty 2, 28d supply, fill #0
  Filled 2021-12-16: qty 2, 28d supply, fill #1
  Filled 2022-01-28: qty 2, 28d supply, fill #2

## 2021-11-18 NOTE — Assessment & Plan Note (Signed)
Well-controlled hypertension.  Continue lisinopril 20 mg daily and propranolol 60 mg 3 times a day. ?BP Readings from Last 3 Encounters:  ?11/18/21 108/74  ?10/01/21 132/82  ?07/01/21 124/80  ? ? ?

## 2021-11-18 NOTE — Assessment & Plan Note (Signed)
Diet and nutrition discussed.  Advised to decrease amount of daily carbohydrate intake. 

## 2021-11-18 NOTE — Assessment & Plan Note (Signed)
Stable.  Continue atorvastatin 20 mg daily. ?The 10-year ASCVD risk score (Arnett DK, et al., 2019) is: 4.5% ?  Values used to calculate the score: ?    Age: 66 years ?    Sex: Female ?    Is Non-Hispanic African American: No ?    Diabetic: No ?    Tobacco smoker: No ?    Systolic Blood Pressure: 301 mmHg ?    Is BP treated: Yes ?    HDL Cholesterol: 56.2 mg/dL ?    Total Cholesterol: 146 mg/dL ? ?

## 2021-11-18 NOTE — Patient Instructions (Signed)
Health Maintenance After Age 65 After age 65, you are at a higher risk for certain long-term diseases and infections as well as injuries from falls. Falls are a major cause of broken bones and head injuries in people who are older than age 65. Getting regular preventive care can help to keep you healthy and well. Preventive care includes getting regular testing and making lifestyle changes as recommended by your health care provider. Talk with your health care provider about: Which screenings and tests you should have. A screening is a test that checks for a disease when you have no symptoms. A diet and exercise plan that is right for you. What should I know about screenings and tests to prevent falls? Screening and testing are the best ways to find a health problem early. Early diagnosis and treatment give you the best chance of managing medical conditions that are common after age 65. Certain conditions and lifestyle choices may make you more likely to have a fall. Your health care provider may recommend: Regular vision checks. Poor vision and conditions such as cataracts can make you more likely to have a fall. If you wear glasses, make sure to get your prescription updated if your vision changes. Medicine review. Work with your health care provider to regularly review all of the medicines you are taking, including over-the-counter medicines. Ask your health care provider about any side effects that may make you more likely to have a fall. Tell your health care provider if any medicines that you take make you feel dizzy or sleepy. Strength and balance checks. Your health care provider may recommend certain tests to check your strength and balance while standing, walking, or changing positions. Foot health exam. Foot pain and numbness, as well as not wearing proper footwear, can make you more likely to have a fall. Screenings, including: Osteoporosis screening. Osteoporosis is a condition that causes  the bones to get weaker and break more easily. Blood pressure screening. Blood pressure changes and medicines to control blood pressure can make you feel dizzy. Depression screening. You may be more likely to have a fall if you have a fear of falling, feel depressed, or feel unable to do activities that you used to do. Alcohol use screening. Using too much alcohol can affect your balance and may make you more likely to have a fall. Follow these instructions at home: Lifestyle Do not drink alcohol if: Your health care provider tells you not to drink. If you drink alcohol: Limit how much you have to: 0-1 drink a day for women. 0-2 drinks a day for men. Know how much alcohol is in your drink. In the [place] one drink equals one 12 oz bottle of beer (355 mL), one 5 oz glass of wine (148 mL), or one 1 oz glass of hard liquor (44 mL). Do not use any products that contain nicotine or tobacco. These products include cigarettes, chewing tobacco, and vaping devices, such as e-cigarettes. If you need help quitting, ask your health care provider. Activity  Follow a regular exercise program to stay fit. This will help you maintain your balance. Ask your health care provider what types of exercise are appropriate for you. If you need a cane or walker, use it as recommended by your health care provider. Wear supportive shoes that have nonskid soles. Safety  Remove any tripping hazards, such as rugs, cords, and clutter. Install safety equipment such as grab bars in bathrooms and safety rails on stairs. Keep rooms and walkways   well-lit. General instructions Talk with your health care provider about your risks for falling. Tell your health care provider if: You fall. Be sure to tell your health care provider about all falls, even ones that seem minor. You feel dizzy, tiredness (fatigue), or off-balance. Take over-the-counter and prescription medicines only as told by your health care provider. These include  supplements. Eat a healthy diet and maintain a healthy weight. A healthy diet includes low-fat dairy products, low-fat (lean) meats, and fiber from whole grains, beans, and lots of fruits and vegetables. Stay current with your vaccines. Schedule regular health, dental, and eye exams. Summary Having a healthy lifestyle and getting preventive care can help to protect your health and wellness after age 65. Screening and testing are the best way to find a health problem early and help you avoid having a fall. Early diagnosis and treatment give you the best chance for managing medical conditions that are more common for people who are older than age 65. Falls are a major cause of broken bones and head injuries in people who are older than age 65. Take precautions to prevent a fall at home. Work with your health care provider to learn what changes you can make to improve your health and wellness and to prevent falls. This information is not intended to replace advice given to you by your health care provider. Make sure you discuss any questions you have with your health care provider. Document Revised: 12/10/2020 Document Reviewed: 12/10/2020 Elsevier Patient Education  2023 Elsevier Inc.  

## 2021-11-18 NOTE — Progress Notes (Signed)
[name] ?66 y.o. ? ? ?Chief Complaint  ?Patient presents with  ? Follow-up  ? Medication Management  ?  Alternative to [employer] not covering   ? ? ?HISTORY OF PRESENT ILLNESS: ?This is a 66 y.o. female with history of prediabetes and morbid obesity had been taking Ozempic with good results but recently got a letter from her insurance stating Ozempic was not no longer be covered by her insurance.  Looking for alternative. ?No other complaint or medical concerns today. ? ?HPI ? ? ?Prior to Admission medications   ?Medication Sig Start Date End Date Taking? Authorizing Provider  ?albuterol (PROAIR HFA) 108 (90 Base) MCG/ACT inhaler Inhale 1-2 puffs into the lungs every 4 (four) hours as needed for wheezing or shortness of breath. 12/25/20  Yes Gracee Ratterree, Ines Bloomer, MD  ?atorvastatin (LIPITOR) 20 MG tablet Take 1 tablet (20 mg total) by mouth daily. 07/01/21  Yes Jasmine Mcbeth, Ines Bloomer, MD  ?busPIRone (BUSPAR) 7.5 MG tablet Take 1 tablet (7.5 mg total) by mouth 2 (two) times daily. 07/08/21  Yes Horald Pollen, MD  ?butalbital-acetaminophen-caffeine [facility]) [phone] MG tablet Take 1 tablet by mouth every 6 (six) hours as needed for headache. 12/25/20  Yes Rashiya Lofland, Ines Bloomer, MD  ?COVID-19 mRNA bivalent vaccine, Pfizer, (PFIZER COVID-19 VAC BIVALENT) injection Inject into the muscle. 05/30/21  Yes Carlyle Basques, MD  ?cyclobenzaprine (FLEXERIL) 10 MG tablet Take 1 tablet (10 mg total) by mouth 3 (three) times daily as needed for muscle spasms. 07/08/21  Yes Jerrine Urschel, Ines Bloomer, MD  ?DULoxetine (CYMBALTA) 60 MG capsule Take 1 capsule (60 mg total) by mouth daily. 08/22/21  Yes Khaleesi Gruel, Ines Bloomer, MD  ?fluticasone [facility]) 50 MCG/ACT nasal spray Place 1 spray into both nostrils daily. 12/25/20  Yes Aleighna Wojtas, Ines Bloomer, MD  ?gabapentin (NEURONTIN) 300 MG capsule Take 1-2 capsules (300-600 mg total) by mouth 4 (four) times daily. 10/07/21  Yes Shawana Knoch, Ines Bloomer, MD  ?guaiFENesin  (ROBITUSSIN) 100 MG/5ML SOLN Take 5 mLs (100 mg total) by mouth every 4 (four) hours as needed for cough or to loosen phlegm. 01/18/21  Yes Montine Circle, PA-C  ?influenza vaccine adjuvanted (FLUAD) 0.5 ML injection Inject into the muscle. 05/30/21  Yes Carlyle Basques, MD  ?lisinopril (ZESTRIL) 20 MG tablet Take 1 tablet (20 mg total) by mouth daily. 08/22/21  Yes Catcher Dehoyos, Ines Bloomer, MD  ?ondansetron (ZOFRAN-ODT) 4 MG disintegrating tablet Dissolve 1 tablet (4 mg total) by mouth every 8 (eight) hours as needed for nausea or vomiting. 08/22/21  Yes Jahking Lesser, Ines Bloomer, MD  ?pantoprazole (PROTONIX) 40 MG tablet Take 1 tablet (40 mg total) by mouth daily. 07/01/21 01/05/22 Yes Daquarius Dubeau, Ines Bloomer, MD  ?propranolol (INDERAL) 60 MG tablet Take 1 tablet (60 mg total) by mouth 3 (three) times daily. 06/05/21  Yes Leonie Man, MD  ?Semaglutide-Weight Management [facility]) 1 MG/0.5ML SOAJ Inject 1 mg into the skin once a week. 11/18/21  Yes October Peery, Ines Bloomer, MD  ?topiramate (TOPAMAX) 50 MG tablet Take 1 tablet (50 mg total) by mouth 2 (two) times daily. 05/06/21  Yes Khalie Wince, Ines Bloomer, MD  ?vortioxetine HBr (TRINTELLIX) 10 MG TABS tablet Take 1 tablet (10 mg total) by mouth daily. 10/01/21  Yes Jakhia Buxton, Ines Bloomer, MD  ?furosemide (LASIX) 20 MG tablet Take 1 tablet (20 mg total) by mouth 2 (two) times daily as needed. ?Patient not taking: Reported on 10/01/2021 09/13/19   Jacelyn Pi, Lilia Argue, MD  ?valACYclovir (VALTREX) 1000 MG tablet Take 2 tablets (2000  mg) initally, then 1 tablet (1000 mg) every 12 hours until resolved, no more than 10 days ?Patient not taking: Reported on 10/01/2021 01/11/21   Mar Daring, PA-C  ? ? ?Allergies  ?Allergen Reactions  ? Sulfa Antibiotics Itching  ? ? ?Patient Active Problem List  ? Diagnosis Date Noted  ? Major depression, recurrent, chronic ([place]) 10/01/2021  ? Hx of adenomatous colonic polyps 01/02/2021  ? Pulmonary arterial hypertension ([place]) 07/04/2020  ? Kidney  cysts 07/04/2020  ? Hyperlipidemia 07/04/2020  ? Depression 07/04/2020  ? Iron deficiency anemia 10/13/2018  ? DOE (dyspnea on exertion) 03/23/2018  ? Obesity, morbid, BMI 40.0-49.9 ([place]) 03/23/2018  ? Chronic diastolic heart failure ([place]) 03/23/2018  ? Grade I diastolic dysfunction 16/05/9603  ? Migraine without aura and without status migrainosus, not intractable 03/02/2017  ? Class 3 severe obesity due to excess calories with serious comorbidity and body mass index (BMI) of 40.0 to 44.9 in adult [facility]) 01/13/2017  ? Hiatal hernia 10/28/2016  ? Pure hypercholesterolemia 10/28/2016  ? Chronic kidney disease 06/01/2015  ? Gastroesophageal reflux disease 06/01/2015  ? Goiter 06/01/2015  ? Essential hypertension 06/01/2015  ? Chronic pain syndrome 05/28/2015  ? Fibromyalgia 05/28/2015  ? OSA (obstructive sleep apnea) 05/28/2015  ? Adjustment disorder with mixed anxiety and depressed mood 05/28/2015  ? ? ?Past Medical History:  ?Diagnosis Date  ? Allergy   ? [name], Flonase  ? Anemia   ? Anxiety   ? Arthritis   ? Chronic kidney disease   ? Colon polyps   ? Depression   ? Fibromyalgia   ? Gastritis   ? GERD (gastroesophageal reflux disease)   ? Hernia, hiatal   ? High risk for colon cancer   ? HLD (hyperlipidemia)   ? Hypertension   ? Migraine   ? Neuropathy   ? Osteoporosis   ? Pneumonia   ? Sleep apnea   ? c-pap nightly  ? Sleep apnea with use of continuous positive airway pressure (CPAP)   ? Thyroid goiter   ? ? ?Past Surgical History:  ?Procedure Laterality Date  ? 48-Hour Holter Monitor  06/2017  ? Mostly normal sinus rhythm.  Average heart rate 71 bpm.  Minimum 54 bpm.  Maximum sinus rate 114 bpm.  Rare PACs and PVCs.  No A. fib, atrial flutter, SVT or VT.  One brief run of 8 beat PAT.  ? ABDOMINAL HYSTERECTOMY    ? ovaries intact; DUB.  No dysplasia.  ? FRACTURE SURGERY N/A   ? Phreesia 09/25/2020  ? JOINT REPLACEMENT    ? OPEN REDUCTION INTERNAL FIXATION (ORIF) DISTAL RADIAL FRACTURE Right 07/09/2017  ?  Procedure: RIGHT WRIST OPEN REDUCTION INTERNAL FIXATION (ORIF) DISTAL RADIAL FRACTURE AND REPAIR AS INDICATED;  Surgeon: Iran Planas, MD;  Location: [place];  Service: Orthopedics;  Laterality: Right;  ? PARTIAL HYSTERECTOMY    ? ROTATOR CUFF REPAIR Right 2009  ? SHOULDER SURGERY Right   ? shoulder dislocation, fall, and elbow unla nerve  ? SHOULDER SURGERY Right 2015  ? labral tear  ? TRANSTHORACIC ECHOCARDIOGRAM  06/2017  ?  EF 55-60%.  grade 1 diastolic dysfunction.  Otherwise normal  ? TUBAL LIGATION    ? ULNAR NERVE REPAIR Right 08/19/2014  ? ? ?Social History  ? ?Socioeconomic History  ? Marital status: Married  ?  Spouse name: Not on file  ? Number of children: 3  ? Years of education: Not on file  ? Highest education level: Not on file  ?Occupational  History  ? Occupation: unemployed  ?Tobacco Use  ? Smoking status: Former  ?  Types: Cigarettes  ?  Quit date: 08/04/1998  ?  Years since quitting: 23.3  ? Smokeless tobacco: Never  ?Vaping Use  ? Vaping Use: Never used  ?Substance and Sexual Activity  ? Alcohol use: No  ? Drug use: No  ? Sexual activity: Yes  ?  Birth control/protection: Surgical, Post-menopausal  ?Other Topics Concern  ? Not on file  ?Social History Narrative  ? Marital status: married x 20 years; moderately happy  ?   Children: 3 children ([place], [place], 40 April); 8 grandchildren; 0 gg  ?   Lives: with husband/Steve, April, 2 granddaughters  ?   Employment:  Unemployed; quit working 2015 with work related injury; awaiting disability in 2018; disability approved in 02/2017.    ?   Tobacco: quit smoking in 2016; smoked x 2 years.  ?    Alcohol: none  ?    Drugs: none  ?    Exercise: rarely in 2018  ?      ? ?Social Determinants of Health  ? ?Financial Resource Strain: Not on file  ?Food Insecurity: Not on file  ?Transportation Needs: Not on file  ?Physical Activity: Not on file  ?Stress: Not on file  ?Social Connections: Not on file  ?Intimate Partner Violence: Not on file  ? ? ?Family  History  ?Problem Relation Age of Onset  ? Heart disease Mother 32  ?     AMI/CAD/CHF as cause of death  ? Hypertension Mother   ? Hyperlipidemia Mother   ? Hypothyroidism Mother   ? Depression Mother   ? Gout Mother   ? Hear

## 2021-11-18 NOTE — Assessment & Plan Note (Signed)
Stable.  Advised to stay well-hydrated and avoid NSAIDs. 

## 2021-11-18 NOTE — Assessment & Plan Note (Signed)
Diet and nutrition discussed.  Ozempic was working well for her but will no longer be covered by her insurance.  We will try Wegovy if covered by insurance. ?Wt Readings from Last 3 Encounters:  ?11/18/21 219 lb 6 oz (99.5 kg)  ?10/01/21 226 lb (102.5 kg)  ?07/01/21 243 lb (110.2 kg)  ? ? ?

## 2021-11-21 ENCOUNTER — Other Ambulatory Visit (HOSPITAL_COMMUNITY): Payer: Self-pay

## 2021-11-21 ENCOUNTER — Telehealth: Payer: Self-pay

## 2021-11-21 NOTE — Telephone Encounter (Signed)
PA for wegovy has been approved ? ?Pharmacy updated - copay $24.99 ? ?Patient updated, to call with any questions or concerns  ? ?Tomasa Blase, PharmD ?Clinical Pharmacist, [place]  ? ? ?

## 2021-11-25 ENCOUNTER — Other Ambulatory Visit (HOSPITAL_COMMUNITY): Payer: Self-pay

## 2021-12-16 ENCOUNTER — Other Ambulatory Visit: Payer: Self-pay | Admitting: Emergency Medicine

## 2021-12-16 DIAGNOSIS — G43909 Migraine, unspecified, not intractable, without status migrainosus: Secondary | ICD-10-CM

## 2021-12-17 ENCOUNTER — Other Ambulatory Visit: Payer: Self-pay | Admitting: Emergency Medicine

## 2021-12-17 ENCOUNTER — Other Ambulatory Visit (HOSPITAL_COMMUNITY): Payer: Self-pay

## 2021-12-17 MED ORDER — TOPIRAMATE 50 MG PO TABS
50.0000 mg | ORAL_TABLET | Freq: Two times a day (BID) | ORAL | 1 refills | Status: DC
  Filled 2021-12-17: qty 180, 90d supply, fill #0
  Filled 2022-03-31: qty 180, 90d supply, fill #1

## 2022-01-02 ENCOUNTER — Telehealth (HOSPITAL_COMMUNITY): Payer: Self-pay | Admitting: Psychiatry

## 2022-01-03 ENCOUNTER — Other Ambulatory Visit: Payer: Self-pay | Admitting: Emergency Medicine

## 2022-01-03 ENCOUNTER — Other Ambulatory Visit (HOSPITAL_COMMUNITY): Payer: Self-pay

## 2022-01-03 DIAGNOSIS — Z8719 Personal history of other diseases of the digestive system: Secondary | ICD-10-CM

## 2022-01-03 DIAGNOSIS — M62838 Other muscle spasm: Secondary | ICD-10-CM

## 2022-01-03 MED ORDER — PANTOPRAZOLE SODIUM 40 MG PO TBEC
40.0000 mg | DELAYED_RELEASE_TABLET | Freq: Every day | ORAL | 0 refills | Status: DC
Start: 2022-01-03 — End: 2022-04-30
  Filled 2022-01-03: qty 90, 90d supply, fill #0

## 2022-01-03 MED ORDER — CYCLOBENZAPRINE HCL 10 MG PO TABS
10.0000 mg | ORAL_TABLET | Freq: Three times a day (TID) | ORAL | 1 refills | Status: DC | PRN
  Filled 2022-01-03: qty 180, 60d supply, fill #0
  Filled 2022-04-30: qty 180, 60d supply, fill #1

## 2022-01-10 ENCOUNTER — Telehealth (HOSPITAL_BASED_OUTPATIENT_CLINIC_OR_DEPARTMENT_OTHER): Payer: 59 | Admitting: Psychiatry

## 2022-01-10 ENCOUNTER — Encounter (HOSPITAL_COMMUNITY): Payer: Self-pay | Admitting: Psychiatry

## 2022-01-10 ENCOUNTER — Encounter: Payer: Self-pay | Admitting: Oncology

## 2022-01-10 DIAGNOSIS — F411 Generalized anxiety disorder: Secondary | ICD-10-CM
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
## 2022-01-28 ENCOUNTER — Other Ambulatory Visit: Payer: Self-pay | Admitting: Emergency Medicine

## 2022-01-28 ENCOUNTER — Other Ambulatory Visit (HOSPITAL_COMMUNITY): Payer: Self-pay

## 2022-01-28 DIAGNOSIS — F411 Generalized anxiety disorder: Secondary | ICD-10-CM

## 2022-01-28 MED ORDER — BUSPIRONE HCL 7.5 MG PO TABS
7.5000 mg | ORAL_TABLET | Freq: Two times a day (BID) | ORAL | 1 refills | Status: DC
  Filled 2022-01-28: qty 180, 90d supply, fill #0
  Filled 2022-02-26 – 2022-05-13 (×2): qty 180, 90d supply, fill #1

## 2022-01-29 ENCOUNTER — Other Ambulatory Visit (HOSPITAL_COMMUNITY): Payer: Self-pay

## 2022-01-30 ENCOUNTER — Other Ambulatory Visit (HOSPITAL_COMMUNITY): Payer: Self-pay

## 2022-01-30 ENCOUNTER — Other Ambulatory Visit: Payer: Self-pay | Admitting: Emergency Medicine

## 2022-01-30 MED ORDER — WEGOVY 1.7 MG/0.75ML ~~LOC~~ SOAJ
1.7000 mg | SUBCUTANEOUS | 5 refills | Status: DC
  Filled 2022-01-30 (×2): qty 3, 28d supply, fill #0
  Filled 2022-02-26: qty 3, 28d supply, fill #1
  Filled 2022-03-31: qty 3, 28d supply, fill #2
  Filled 2022-04-30: qty 3, 28d supply, fill #3

## 2022-02-01 ENCOUNTER — Other Ambulatory Visit: Payer: Self-pay | Admitting: Emergency Medicine

## 2022-02-01 DIAGNOSIS — J309 Allergic rhinitis, unspecified: Secondary | ICD-10-CM

## 2022-02-02 MED ORDER — FLUTICASONE PROPIONATE 50 MCG/ACT NA SUSP
1.0000 | Freq: Every day | NASAL | 6 refills | Status: DC
  Filled 2022-02-02 – 2022-06-02 (×2): qty 16, 60d supply, fill #0
  Filled 2022-10-16: qty 16, 60d supply, fill #1
  Filled 2022-12-20: qty 16, 60d supply, fill #2

## 2022-02-03 ENCOUNTER — Other Ambulatory Visit (HOSPITAL_COMMUNITY): Payer: Self-pay

## 2022-02-10 ENCOUNTER — Encounter: Payer: Self-pay | Admitting: Oncology

## 2022-02-11 ENCOUNTER — Other Ambulatory Visit (HOSPITAL_COMMUNITY): Payer: Self-pay

## 2022-02-26 ENCOUNTER — Other Ambulatory Visit: Payer: Self-pay | Admitting: Emergency Medicine

## 2022-02-26 ENCOUNTER — Other Ambulatory Visit (HOSPITAL_COMMUNITY): Payer: Self-pay

## 2022-02-26 DIAGNOSIS — M797 Fibromyalgia: Secondary | ICD-10-CM

## 2022-02-26 DIAGNOSIS — F32A Depression, unspecified: Secondary | ICD-10-CM

## 2022-02-26 DIAGNOSIS — R110 Nausea: Secondary | ICD-10-CM

## 2022-02-26 MED ORDER — DULOXETINE HCL 60 MG PO CPEP
60.0000 mg | ORAL_CAPSULE | Freq: Every day | ORAL | 1 refills | Status: DC
  Filled 2022-02-26: qty 90, 90d supply, fill #0
  Filled 2022-06-02: qty 90, 90d supply, fill #1

## 2022-02-26 MED ORDER — ONDANSETRON 4 MG PO TBDP
4.0000 mg | ORAL_TABLET | Freq: Three times a day (TID) | ORAL | 4 refills | Status: DC | PRN
  Filled 2022-02-26: qty 20, 7d supply, fill #0

## 2022-03-18 ENCOUNTER — Telehealth: Payer: 59 | Admitting: Physician Assistant

## 2022-03-18 ENCOUNTER — Other Ambulatory Visit (HOSPITAL_COMMUNITY): Payer: Self-pay

## 2022-03-18 DIAGNOSIS — J019 Acute sinusitis, unspecified: Secondary | ICD-10-CM

## 2022-03-18 MED ORDER — FLUCONAZOLE 150 MG PO TABS
ORAL_TABLET | ORAL | 0 refills | Status: DC
  Filled 2022-03-18: qty 2, 4d supply, fill #0

## 2022-03-18 MED ORDER — AMOXICILLIN-POT CLAVULANATE 875-125 MG PO TABS
1.0000 | ORAL_TABLET | Freq: Two times a day (BID) | ORAL | 0 refills | Status: AC
Start: 2022-03-18 — End: 2022-03-25
  Filled 2022-03-18: qty 14, 7d supply, fill #0

## 2022-03-18 MED ORDER — BENZONATATE 100 MG PO CAPS
100.0000 mg | ORAL_CAPSULE | Freq: Three times a day (TID) | ORAL | 0 refills | Status: AC
  Filled 2022-03-18: qty 15, 5d supply, fill #0

## 2022-03-18 NOTE — Progress Notes (Signed)
E-Visit for Sinus Problems  We are sorry that you are not feeling well.  Here is how we plan to help!  Based on what you have shared with me it looks like you have sinusitis.  Sinusitis is inflammation and infection in the sinus cavities of the head.  Based on your presentation I believe you most likely have Acute Bacterial Sinusitis.  This is an infection caused by bacteria and is treated with antibiotics. I have prescribed Augmentin '875mg'$ /'125mg'$  one tablet twice daily with food, for 7 days. I have also prescribed tessalon for your cough. You may use an oral decongestant such as Mucinex D or if you have glaucoma or high blood pressure use plain Mucinex. Saline nasal spray help and can safely be used as often as needed for congestion.  If you develop worsening sinus pain, fever or notice severe headache and vision changes, or if symptoms are not better after completion of antibiotic, please schedule an appointment with a health care provider.    Sinus infections are not as easily transmitted as other respiratory infection, however we still recommend that you avoid close contact with loved ones, especially the very young and elderly.  Remember to wash your hands thoroughly throughout the day as this is the number one way to prevent the spread of infection!  Home Care: Only take medications as instructed by your medical team. Complete the entire course of an antibiotic. Do not take these medications with alcohol. A steam or ultrasonic humidifier can help congestion.  You can place a towel over your head and breathe in the steam from hot water coming from a faucet. Avoid close contacts especially the very young and the elderly. Cover your mouth when you cough or sneeze. Always remember to wash your hands.  Get Help Right Away If: You develop worsening fever or sinus pain. You develop a severe head ache or visual changes. Your symptoms persist after you have completed your treatment plan.  Make sure  you Understand these instructions. Will watch your condition. Will get help right away if you are not doing well or get worse.  Thank you for choosing an e-visit.  Your e-visit answers were reviewed by a board certified advanced clinical practitioner to complete your personal care plan. Depending upon the condition, your plan could have included both over the counter or prescription medications.  Please review your pharmacy choice. Make sure the pharmacy is open so you can pick up prescription now. If there is a problem, you may contact your provider through [employer] and have the prescription routed to another pharmacy.  Your safety is important to [place]. If you have drug allergies check your prescription carefully.   For the next 24 hours you can use MyChart to ask questions about today's visit, request a non-urgent call back, or ask for a work or school excuse. You will get an email in the next two days asking about your experience. I hope that your e-visit has been valuable and will speed your recovery.  Approximately 5 minutes was spent documenting and reviewing patient's chart.

## 2022-03-18 NOTE — Addendum Note (Signed)
Addended by: Rodney Booze on: 03/18/2022 02:48 PM   Modules accepted: Orders

## 2022-03-26 ENCOUNTER — Ambulatory Visit (INDEPENDENT_AMBULATORY_CARE_PROVIDER_SITE_OTHER): Payer: 59

## 2022-03-26 ENCOUNTER — Other Ambulatory Visit (HOSPITAL_COMMUNITY): Payer: Self-pay

## 2022-03-26 ENCOUNTER — Encounter: Payer: Self-pay | Admitting: Emergency Medicine

## 2022-03-26 ENCOUNTER — Ambulatory Visit (INDEPENDENT_AMBULATORY_CARE_PROVIDER_SITE_OTHER): Payer: 59 | Admitting: Emergency Medicine

## 2022-03-26 VITALS — BP 120/84 | HR 72 | Temp 98.2°F | Ht 64.0 in | Wt 200.4 lb

## 2022-03-26 DIAGNOSIS — R079 Chest pain, unspecified: Secondary | ICD-10-CM | POA: Diagnosis not present

## 2022-03-26 DIAGNOSIS — K449 Diaphragmatic hernia without obstruction or gangrene: Secondary | ICD-10-CM | POA: Diagnosis not present

## 2022-03-26 DIAGNOSIS — R110 Nausea: Secondary | ICD-10-CM | POA: Diagnosis not present

## 2022-03-26 DIAGNOSIS — R7303 Prediabetes: Secondary | ICD-10-CM | POA: Diagnosis not present

## 2022-03-26 DIAGNOSIS — R059 Cough, unspecified: Secondary | ICD-10-CM | POA: Diagnosis not present

## 2022-03-26 DIAGNOSIS — R051 Acute cough: Secondary | ICD-10-CM | POA: Insufficient documentation

## 2022-03-26 DIAGNOSIS — J22 Unspecified acute lower respiratory infection: Secondary | ICD-10-CM | POA: Insufficient documentation

## 2022-03-26 DIAGNOSIS — I10 Essential (primary) hypertension: Secondary | ICD-10-CM

## 2022-03-26 LAB — CBC WITH DIFFERENTIAL/PLATELET
Basophils Absolute: 0 10*3/uL (ref 0.0–0.1)
Basophils Relative: 0.3 % (ref 0.0–3.0)
Eosinophils Absolute: 0.1 10*3/uL (ref 0.0–0.7)
Eosinophils Relative: 1.9 % (ref 0.0–5.0)
HCT: 38.4 % (ref 36.0–46.0)
Hemoglobin: 12.8 g/dL (ref 12.0–15.0)
Lymphocytes Relative: 16.1 % (ref 12.0–46.0)
Lymphs Abs: 1.3 10*3/uL (ref 0.7–4.0)
MCHC: 33.3 g/dL (ref 30.0–36.0)
MCV: 90.4 fl (ref 78.0–100.0)
Monocytes Absolute: 0.5 10*3/uL (ref 0.1–1.0)
Monocytes Relative: 6.7 % (ref 3.0–12.0)
Neutro Abs: 5.8 10*3/uL (ref 1.4–7.7)
Neutrophils Relative %: 75 % (ref 43.0–77.0)
Platelets: 200 10*3/uL (ref 150.0–400.0)
RBC: 4.25 Mil/uL (ref 3.87–5.11)
RDW: 13.9 % (ref 11.5–15.5)
WBC: 7.8 10*3/uL (ref 4.0–10.5)

## 2022-03-26 LAB — HEMOGLOBIN A1C: Hgb A1c MFr Bld: 5.8 % (ref 4.6–6.5)

## 2022-03-26 LAB — COMPREHENSIVE METABOLIC PANEL
ALT: 8 U/L (ref 0–35)
AST: 14 U/L (ref 0–37)
Albumin: 3.9 g/dL (ref 3.5–5.2)
Alkaline Phosphatase: 60 U/L (ref 39–117)
BUN: 11 mg/dL (ref 6–23)
CO2: 27 mEq/L (ref 19–32)
Calcium: 8.9 mg/dL (ref 8.4–10.5)
Chloride: 106 mEq/L (ref 96–112)
Creatinine, Ser: 1.05 mg/dL (ref 0.40–1.20)
GFR: 55.53 mL/min — ABNORMAL LOW (ref >60.00)
Glucose, Bld: 86 mg/dL (ref 70–99)
Potassium: 3.7 mEq/L (ref 3.5–5.1)
Sodium: 137 mEq/L (ref 135–145)
Total Bilirubin: 0.4 mg/dL (ref 0.2–1.2)
Total Protein: 6.7 g/dL (ref 6.0–8.3)

## 2022-03-26 MED ORDER — LISINOPRIL 20 MG PO TABS
20.0000 mg | ORAL_TABLET | Freq: Every day | ORAL | 1 refills | Status: DC
  Filled 2022-03-26: qty 90, 90d supply, fill #0
  Filled 2022-07-04: qty 90, 90d supply, fill #1

## 2022-03-26 MED ORDER — AZITHROMYCIN 250 MG PO TABS
ORAL_TABLET | ORAL | 0 refills | Status: DC
  Filled 2022-03-26: qty 6, 5d supply, fill #0

## 2022-03-26 MED ORDER — ONDANSETRON 4 MG PO TBDP
4.0000 mg | ORAL_TABLET | Freq: Three times a day (TID) | ORAL | 4 refills | Status: DC | PRN
  Filled 2022-03-26: qty 20, 7d supply, fill #0
  Filled 2022-04-30: qty 20, 7d supply, fill #1
  Filled 2022-06-02: qty 20, 7d supply, fill #2
  Filled 2022-07-04: qty 20, 7d supply, fill #3
  Filled 2022-08-13: qty 20, 7d supply, fill #4

## 2022-03-26 MED ORDER — HYDROCODONE BIT-HOMATROP MBR 5-1.5 MG/5ML PO SOLN
5.0000 mL | Freq: Every evening | ORAL | 0 refills | Status: DC | PRN
  Filled 2022-03-26: qty 120, 24d supply, fill #0

## 2022-03-26 NOTE — Progress Notes (Signed)
[name] 66 y.o.   Chief Complaint  Patient presents with   Cough    Cough , congestion x 2 weeks, chest pain radiating in her back x 3 days     HISTORY OF PRESENT ILLNESS: This is a 66 y.o. female complaining of 2-week history of flulike symptoms mostly cough and lack of energy.  Had a ED visit about 10 days ago and was prescribed Amoxil for 7 days for sinus infection.  Was also prescribed Tessalon.  Has been taking Mucinex DM over-the-counter.  Not feeling better, progressively getting worse.  Chest congestion and cough causing chest pain that sometimes goes into her back. No other associated symptoms. No other complaints or medical concerns today.  Cough Associated symptoms include chest pain. Pertinent negatives include no chills, fever, headaches, hemoptysis, rash, sore throat, shortness of breath or wheezing.     Prior to Admission medications   Medication Sig Start Date End Date Taking? Authorizing Provider  albuterol (PROAIR HFA) 108 (90 Base) MCG/ACT inhaler Inhale 1-2 puffs into the lungs every 4 (four) hours as needed for wheezing or shortness of breath. 12/25/20  Yes Shimon Trowbridge, Ines Bloomer, MD  atorvastatin (LIPITOR) 20 MG tablet Take 1 tablet (20 mg total) by mouth daily. 07/01/21  Yes Barth Trella, Ines Bloomer, MD  azithromycin (ZITHROMAX) 250 MG tablet Take 2 tablets (500 mg total) by mouth daily for 1 day, THEN 1 tablet (250 mg total) daily for 4 days. 03/26/22 03/31/22 Yes Zymire Turnbo, Ines Bloomer, MD  busPIRone (BUSPAR) 7.5 MG tablet Take 1 tablet (7.5 mg total) by mouth 2 (two) times daily. 01/28/22  Yes Babita Amaker, Ines Bloomer, MD  butalbital-acetaminophen-caffeine [facility]) [phone] MG tablet Take 1 tablet by mouth every 6 (six) hours as needed for headache. 12/25/20  Yes Alzina Golda, Ines Bloomer, MD  cyclobenzaprine (FLEXERIL) 10 MG tablet Take 1 tablet (10 mg total) by mouth 3 (three) times daily as needed for muscle spasms. 01/03/22  Yes Finch Costanzo, Ines Bloomer, MD  DULoxetine  (CYMBALTA) 60 MG capsule Take 1 capsule (60 mg total) by mouth daily. 02/26/22  Yes Lavonne Cass, Ines Bloomer, MD  fluconazole (DIFLUCAN) 150 MG tablet Take one tablet on the day you fill the prescription. If you continue to have symptoms then take the second tablet in 3 days. 03/18/22  Yes Couture, Cortni S, PA-C  fluticasone (FLONASE) 50 MCG/ACT nasal spray Place 1 spray into both nostrils daily. 02/02/22  Yes Markies Mowatt, Ines Bloomer, MD  furosemide (LASIX) 20 MG tablet Take 1 tablet (20 mg total) by mouth 2 (two) times daily as needed. 09/13/19  Yes Jacelyn Pi, Lilia Argue, MD  gabapentin (NEURONTIN) 300 MG capsule Take 1-2 capsules (300-600 mg total) by mouth 4 (four) times daily. 10/07/21  Yes Demi Trieu, Ines Bloomer, MD  HYDROcodone bit-homatropine [facility]) 5-1.5 MG/5ML syrup Take 5 mLs by mouth at bedtime as needed for cough. 03/26/22  Yes Khamya Topp, Ines Bloomer, MD  pantoprazole (PROTONIX) 40 MG tablet Take 1 tablet (40 mg total) by mouth daily. 01/03/22 04/03/22 Yes Dashawn Bartnick, Ines Bloomer, MD  propranolol (INDERAL) 60 MG tablet Take 1 tablet (60 mg total) by mouth 3 (three) times daily. 06/05/21  Yes Leonie Man, MD  Semaglutide-Weight Management (WEGOVY) 1.7 MG/0.75ML SOAJ Inject 1.7 mg into the skin once a week. 01/30/22  Yes Oliana Gowens, Ines Bloomer, MD  topiramate (TOPAMAX) 50 MG tablet Take 1 tablet (50 mg total) by mouth 2 (two) times daily. 12/17/21  Yes Larrisha Babineau, Ines Bloomer, MD  vortioxetine HBr (TRINTELLIX) 10 MG TABS tablet  Take 1 tablet (10 mg total) by mouth daily. 10/01/21  Yes Marketta Valadez, Ines Bloomer, MD  lisinopril (ZESTRIL) 20 MG tablet Take 1 tablet (20 mg total) by mouth daily. 03/26/22   Horald Pollen, MD  ondansetron (ZOFRAN-ODT) 4 MG disintegrating tablet Dissolve 1 tablet (4 mg total) by mouth every 8 (eight) hours as needed for nausea or vomiting. 03/26/22   Horald Pollen, MD  valACYclovir (VALTREX) 1000 MG tablet Take 2 tablets (2000 mg) initally, then 1 tablet (1000 mg) every 12  hours until resolved, no more than 10 days Patient not taking: Reported on 10/01/2021 01/11/21   Mar Daring, PA-C    Allergies  Allergen Reactions   Sulfa Antibiotics Itching    Patient Active Problem List   Diagnosis Date Noted   Prediabetes 11/18/2021   Major depression, recurrent, chronic ([place]) 10/01/2021   Hx of adenomatous colonic polyps 01/02/2021   Pulmonary arterial hypertension ([place]) 07/04/2020   Kidney cysts 07/04/2020   Hyperlipidemia 07/04/2020   Depression 07/04/2020   Iron deficiency anemia 10/13/2018   DOE (dyspnea on exertion) 03/23/2018   Obesity, morbid, BMI 40.0-49.9 ([place]) 03/23/2018   Chronic diastolic heart failure ([place]) 03/23/2018   Grade I diastolic dysfunction 68/07/7516   Migraine without aura and without status migrainosus, not intractable 03/02/2017   Class 3 severe obesity due to excess calories with serious comorbidity and body mass index (BMI) of 40.0 to 44.9 in adult ([place]) 01/13/2017   Hiatal hernia 10/28/2016   Pure hypercholesterolemia 10/28/2016   Chronic kidney disease 06/01/2015   Gastroesophageal reflux disease 06/01/2015   Goiter 06/01/2015   Essential hypertension 06/01/2015   Chronic pain syndrome 05/28/2015   Fibromyalgia 05/28/2015   OSA (obstructive sleep apnea) 05/28/2015   Adjustment disorder with mixed anxiety and depressed mood 05/28/2015    Past Medical History:  Diagnosis Date   Allergy    [name], Flonase   Anemia    Anxiety    Arthritis    Chronic kidney disease    Colon polyps    Depression    Fibromyalgia    Gastritis    GERD (gastroesophageal reflux disease)    Hernia, hiatal    High risk for colon cancer    HLD (hyperlipidemia)    Hypertension    Migraine    Neuropathy    Osteoporosis    Pneumonia    Sleep apnea    c-pap nightly   Sleep apnea with use of continuous positive airway pressure (CPAP)    Thyroid goiter     Past Surgical History:  Procedure Laterality Date   48-Hour Holter  Monitor  06/2017   Mostly normal sinus rhythm.  Average heart rate 71 bpm.  Minimum 54 bpm.  Maximum sinus rate 114 bpm.  Rare PACs and PVCs.  No A. fib, atrial flutter, SVT or VT.  One brief run of 8 beat PAT.   ABDOMINAL HYSTERECTOMY     ovaries intact; DUB.  No dysplasia.   FRACTURE SURGERY N/A    [employer] 09/25/2020   JOINT REPLACEMENT     OPEN REDUCTION INTERNAL FIXATION (ORIF) DISTAL RADIAL FRACTURE Right 07/09/2017   Procedure: RIGHT WRIST OPEN REDUCTION INTERNAL FIXATION (ORIF) DISTAL RADIAL FRACTURE AND REPAIR AS INDICATED;  Surgeon: Iran Planas, MD;  Location: [facility];  Service: Orthopedics;  Laterality: Right;   PARTIAL HYSTERECTOMY     ROTATOR CUFF REPAIR Right 2009   SHOULDER SURGERY Right    shoulder dislocation, fall, and elbow unla nerve   SHOULDER SURGERY  Right 2015   labral tear   TRANSTHORACIC ECHOCARDIOGRAM  06/2017    EF 55-60%.  grade 1 diastolic dysfunction.  Otherwise normal   TUBAL LIGATION     ULNAR NERVE REPAIR Right 08/19/2014    Social History   Socioeconomic History   Marital status: Married    Spouse name: Not on file   Number of children: 3   Years of education: Not on file   Highest education level: Not on file  Occupational History   Occupation: unemployed  Tobacco Use   Smoking status: Former    Types: Cigarettes    Quit date: 08/04/1998    Years since quitting: 23.6   Smokeless tobacco: Never  Vaping Use   Vaping Use: Never used  Substance and Sexual Activity   Alcohol use: No   Drug use: No   Sexual activity: Yes    Birth control/protection: Surgical, Post-menopausal  Other Topics Concern   Not on file  Social History Narrative   Marital status: married x 20 years; moderately happy     Children: 3 children (47 [name], [place], 40 April); 8 grandchildren; 0 gg     Lives: with husband/Steve, April, 2 granddaughters     Employment:  Unemployed; quit working 2015 with work related injury; awaiting disability in 2018; disability  approved in 02/2017.       Tobacco: quit smoking in 2016; smoked x 2 years.      Alcohol: none      Drugs: none      Exercise: rarely in 2018         Social Determinants of Health   Financial Resource Strain: Not on file  Food Insecurity: Not on file  Transportation Needs: Not on file  Physical Activity: Not on file  Stress: Not on file  Social Connections: Not on file  Intimate Partner Violence: Not on file    Family History  Problem Relation Age of Onset   Heart disease Mother 9       AMI/CAD/CHF as cause of death   Hypertension Mother    Hyperlipidemia Mother    Hypothyroidism Mother    Depression Mother    Gout Mother    Heart disease Father 48       AMI   Hypertension Father    Stroke Father 70       mild CVA   Prostate cancer Father    Hyperlipidemia Father    Cancer Father 14       prostate cancer   Hypertension Brother    Hyperlipidemia Brother    Cancer Brother        prostate cancer   Cancer Maternal Grandmother        type unknown   Cancer Maternal Grandfather        type unknown   Heart disease Paternal Grandmother    Heart defect Paternal Grandfather    Melanoma Brother    Hyperlipidemia Brother    Hypertension Brother    Cancer Brother        melanoma   Hypertension Brother    Hyperlipidemia Brother    Melanoma Brother    Cancer Brother 67       melanoma   Cancer Sister        Basal cell carcinoma scalp   Depression Sister    Hypertension Brother    Depression Brother    Hypertension Brother    Gout Brother    Arthritis Sister    Depression Sister  Colon cancer Neg Hx    Esophageal cancer Neg Hx    Stomach cancer Neg Hx    Rectal cancer Neg Hx      Review of Systems  Constitutional: Negative.  Negative for chills and fever.  HENT:  Positive for congestion. Negative for sore throat.   Respiratory:  Positive for cough and sputum production. Negative for hemoptysis, shortness of breath and wheezing.   Cardiovascular:  Positive  for chest pain. Negative for palpitations.  Gastrointestinal:  Negative for abdominal pain, diarrhea, nausea and vomiting.  Genitourinary: Negative.   Skin: Negative.  Negative for rash.  Neurological: Negative.  Negative for dizziness and headaches.  All other systems reviewed and are negative.  Today's Vitals   03/26/22 1058  BP: 120/84  Pulse: 72  Temp: 98.2 F (36.8 C)  TempSrc: Oral  SpO2: 97%  Weight: 200 lb 6 oz (90.9 kg)  Height: '5\' 4"'$  (1.626 m)   Body mass index is 34.39 kg/m. Wt Readings from Last 3 Encounters:  03/26/22 200 lb 6 oz (90.9 kg)  11/18/21 219 lb 6 oz (99.5 kg)  10/01/21 226 lb (102.5 kg)     Physical Exam Vitals reviewed.  Constitutional:      Appearance: Normal appearance.  HENT:     Head: Normocephalic.     Right Ear: Tympanic membrane, ear canal and external ear normal.     Left Ear: Tympanic membrane, ear canal and external ear normal.     Ears:     Comments: Bilateral hearing aids removed    Mouth/Throat:     Mouth: Mucous membranes are moist.     Pharynx: Oropharynx is clear.  Eyes:     Extraocular Movements: Extraocular movements intact.     Conjunctiva/sclera: Conjunctivae normal.     Pupils: Pupils are equal, round, and reactive to light.  Cardiovascular:     Rate and Rhythm: Normal rate and regular rhythm.     Pulses: Normal pulses.     Heart sounds: Normal heart sounds.  Pulmonary:     Effort: Pulmonary effort is normal.     Breath sounds: Normal breath sounds.  Musculoskeletal:     Cervical back: No tenderness.     Right lower leg: No edema.     Left lower leg: No edema.  Lymphadenopathy:     Cervical: No cervical adenopathy.  Skin:    General: Skin is warm and dry.     Capillary Refill: Capillary refill takes less than 2 seconds.  Neurological:     General: No focal deficit present.     Mental Status: She is alert and oriented to person, place, and time.  Psychiatric:        Mood and Affect: Mood normal.         Behavior: Behavior normal.    DG Chest 2 View  Result Date: 03/26/2022 CLINICAL DATA:  Cough and pleuritic chest pain congestion over the last 2-3 weeks. EXAM: CHEST - 2 VIEW COMPARISON:  10/08/2018 FINDINGS: Heart size is normal. Large hiatal hernia as seen previously. The lungs are clear. No infiltrate, collapse or effusion. Chronic kyphotic curvature and degenerative change at the thoracolumbar junction as seen previously. Old healed rib fractures on the right. IMPRESSION: No active cardiopulmonary disease.  Large hiatal hernia. Electronically Signed   By: Nelson Chimes M.D.   On: 03/26/2022 11:32     ASSESSMENT & PLAN: A total of 48 minutes was spent with the patient and counseling/coordination of care regarding preparing for  this visit, review of most recent office visit notes, review of multiple chronic medical problems and their management, review of all medications, review of chest x-ray done today, diagnosis of acute bronchitis and need for antibiotic treatment, prognosis, documentation, and need for follow-up.  Problem List Items Addressed This Visit       Cardiovascular and Mediastinum   Essential hypertension (Chronic)    Well-controlled hypertension. BP Readings from Last 3 Encounters:  03/26/22 120/84  11/18/21 108/74  10/01/21 132/82  Continue lisinopril 20 mg daily. Losing weight and eating better.       Relevant Medications   lisinopril (ZESTRIL) 20 MG tablet     Respiratory   Lower respiratory infection - Primary    Progressively getting worse over the past 2 weeks.  No pneumonia on chest x-ray. On responsive to 7 days of amoxicillin. We will start azithromycin daily for 5 days. ED precautions given.      Relevant Medications   azithromycin (ZITHROMAX) 250 MG tablet   Other Relevant Orders   DG Chest 2 View (Completed)   CBC with Differential/Platelet   Comprehensive metabolic panel     Other   Prediabetes   Relevant Orders   Hemoglobin A1c   Acute  cough    Continue Tessalon and over-the-counter Mucinex DM. Take Hycodan syrup as needed. Advised to rest and stay well-hydrated.      Relevant Medications   HYDROcodone bit-homatropine (HYCODAN) 5-1.5 MG/5ML syrup   Other Relevant Orders   DG Chest 2 View (Completed)   Nausea without vomiting   Relevant Medications   ondansetron (ZOFRAN-ODT) 4 MG disintegrating tablet   Patient Instructions  Acute Bronchitis, Adult  Acute bronchitis is when air tubes in the lungs (bronchi) suddenly get swollen. The condition can make it hard for you to breathe. In adults, acute bronchitis usually goes away within 2 weeks. A cough caused by bronchitis may last up to 3 weeks. Smoking, allergies, and asthma can make the condition worse. What are the causes? Germs that cause cold and flu (viruses). The most common cause of this condition is the virus that causes the common cold. Bacteria. Substances that bother (irritate) the lungs, including: Smoke from cigarettes and other types of tobacco. Dust and pollen. Fumes from chemicals, gases, or burned fuel. Indoor or outdoor air pollution. What increases the risk? A weak body's defense system. This is also called the immune system. Any condition that affects your lungs and breathing, such as asthma. What are the signs or symptoms? A cough. Coughing up clear, yellow, or green mucus. Making high-pitched whistling sounds when you breathe, most often when you breathe out (wheezing). Runny or stuffy nose. Having too much mucus in your lungs (chest congestion). Shortness of breath. Body aches. A sore throat. How is this treated? Acute bronchitis may go away over time without treatment. Your doctor may tell you to: Drink more fluids. This will help thin your mucus so it is easier to cough up. Use a device that gets medicine into your lungs (inhaler). Use a vaporizer or a humidifier. These are machines that add water to the air. This helps with coughing  and poor breathing. Take a medicine that thins mucus and helps clear it from your lungs. Take a medicine that prevents or stops coughing. It is not common to take an antibiotic medicine for this condition. Follow these instructions at home:  Take over-the-counter and prescription medicines only as told by your doctor. Use an inhaler, vaporizer, or humidifier as told  by your doctor. Take two teaspoons (10 mL) of honey at bedtime. This helps lessen your coughing at night. Drink enough fluid to keep your pee (urine) pale yellow. Do not smoke or use any products that contain nicotine or tobacco. If you need help quitting, ask your doctor. Get a lot of rest. Return to your normal activities when your doctor says that it is safe. Keep all follow-up visits. How is this prevented?  Wash your hands often with soap and water for at least 20 seconds. If you cannot use soap and water, use hand sanitizer. Avoid contact with people who have cold symptoms. Try not to touch your mouth, nose, or eyes with your hands. Avoid breathing in smoke or chemical fumes. Make sure to get the flu shot every year. Contact a doctor if: Your symptoms do not get better in 2 weeks. You have trouble coughing up the mucus. Your cough keeps you awake at night. You have a fever. Get help right away if: You cough up blood. You have chest pain. You have very bad shortness of breath. You faint or keep feeling like you are going to faint. You have a very bad headache. Your fever or chills get worse. These symptoms may be an emergency. Get help right away. Call your local emergency services (911 in the [place] Do not wait to see if the symptoms will go away. Do not drive yourself to the hospital. Summary Acute bronchitis is when air tubes in the lungs (bronchi) suddenly get swollen. In adults, acute bronchitis usually goes away within 2 weeks. Drink more fluids. This will help thin your mucus so it is easier to cough  up. Take over-the-counter and prescription medicines only as told by your doctor. Contact a doctor if your symptoms do not improve after 2 weeks of treatment. This information is not intended to replace advice given to you by your health care provider. Make sure you discuss any questions you have with your health care provider. Document Revised: 11/21/2020 Document Reviewed: 11/21/2020 Elsevier Patient Education  [place], MD [facility]

## 2022-03-26 NOTE — Assessment & Plan Note (Addendum)
Well-controlled hypertension. BP Readings from Last 3 Encounters:  03/26/22 120/84  11/18/21 108/74  10/01/21 132/82  Continue lisinopril 20 mg daily. Losing weight and eating better.

## 2022-03-26 NOTE — Assessment & Plan Note (Signed)
Continue Tessalon and over-the-counter Mucinex DM. Take Hycodan syrup as needed. Advised to rest and stay well-hydrated.

## 2022-03-26 NOTE — Assessment & Plan Note (Signed)
Progressively getting worse over the past 2 weeks.  No pneumonia on chest x-ray. On responsive to 7 days of amoxicillin. We will start azithromycin daily for 5 days. ED precautions given.

## 2022-03-26 NOTE — Patient Instructions (Signed)
  Acute Bronchitis, Adult  Acute bronchitis is when air tubes in the lungs (bronchi) suddenly get swollen. The condition can make it hard for you to breathe. In adults, acute bronchitis usually goes away within 2 weeks. A cough caused by bronchitis may last up to 3 weeks. Smoking, allergies, and asthma can make the condition worse. What are the causes? Germs that cause cold and flu (viruses). The most common cause of this condition is the virus that causes the common cold. Bacteria. Substances that bother (irritate) the lungs, including: Smoke from cigarettes and other types of tobacco. Dust and pollen. Fumes from chemicals, gases, or burned fuel. Indoor or outdoor air pollution. What increases the risk? A weak body's defense system. This is also called the immune system. Any condition that affects your lungs and breathing, such as asthma. What are the signs or symptoms? A cough. Coughing up clear, yellow, or green mucus. Making high-pitched whistling sounds when you breathe, most often when you breathe out (wheezing). Runny or stuffy nose. Having too much mucus in your lungs (chest congestion). Shortness of breath. Body aches. A sore throat. How is this treated? Acute bronchitis may go away over time without treatment. Your doctor may tell you to: Drink more fluids. This will help thin your mucus so it is easier to cough up. Use a device that gets medicine into your lungs (inhaler). Use a vaporizer or a humidifier. These are machines that add water to the air. This helps with coughing and poor breathing. Take a medicine that thins mucus and helps clear it from your lungs. Take a medicine that prevents or stops coughing. It is not common to take an antibiotic medicine for this condition. Follow these instructions at home:  Take over-the-counter and prescription medicines only as told by your doctor. Use an inhaler, vaporizer, or humidifier as told by your doctor. Take two  teaspoons (10 mL) of honey at bedtime. This helps lessen your coughing at night. Drink enough fluid to keep your pee (urine) pale yellow. Do not smoke or use any products that contain nicotine or tobacco. If you need help quitting, ask your doctor. Get a lot of rest. Return to your normal activities when your doctor says that it is safe. Keep all follow-up visits. How is this prevented?  Wash your hands often with soap and water for at least 20 seconds. If you cannot use soap and water, use hand sanitizer. Avoid contact with people who have cold symptoms. Try not to touch your mouth, nose, or eyes with your hands. Avoid breathing in smoke or chemical fumes. Make sure to get the flu shot every year. Contact a doctor if: Your symptoms do not get better in 2 weeks. You have trouble coughing up the mucus. Your cough keeps you awake at night. You have a fever. Get help right away if: You cough up blood. You have chest pain. You have very bad shortness of breath. You faint or keep feeling like you are going to faint. You have a very bad headache. Your fever or chills get worse. These symptoms may be an emergency. Get help right away. Call your local emergency services (911 in the [place] Do not wait to see if the symptoms will go away. Do not drive yourself to the hospital. Summary Acute bronchitis is when air tubes in the lungs (bronchi) suddenly get swollen. In adults, acute bronchitis usually goes away within 2 weeks. Drink more fluids. This will help thin your mucus so it   is easier to cough up. Take over-the-counter and prescription medicines only as told by your doctor. Contact a doctor if your symptoms do not improve after 2 weeks of treatment. This information is not intended to replace advice given to you by your health care provider. Make sure you discuss any questions you have with your health care provider. Document Revised: 11/21/2020 Document Reviewed: 11/21/2020 Elsevier  Patient Education  2023 Elsevier Inc.  

## 2022-03-31 ENCOUNTER — Other Ambulatory Visit: Payer: Self-pay | Admitting: Emergency Medicine

## 2022-03-31 ENCOUNTER — Other Ambulatory Visit (HOSPITAL_COMMUNITY): Payer: Self-pay

## 2022-03-31 DIAGNOSIS — F339 Major depressive disorder, recurrent, unspecified: Secondary | ICD-10-CM

## 2022-03-31 MED ORDER — VORTIOXETINE HBR 10 MG PO TABS
10.0000 mg | ORAL_TABLET | Freq: Every day | ORAL | 1 refills | Status: DC
  Filled 2022-03-31: qty 90, 90d supply, fill #0
  Filled 2022-07-04: qty 90, 90d supply, fill #1

## 2022-04-30 ENCOUNTER — Other Ambulatory Visit (HOSPITAL_COMMUNITY): Payer: Self-pay

## 2022-04-30 ENCOUNTER — Other Ambulatory Visit: Payer: Self-pay | Admitting: Emergency Medicine

## 2022-04-30 DIAGNOSIS — Z8719 Personal history of other diseases of the digestive system: Secondary | ICD-10-CM

## 2022-04-30 MED ORDER — PANTOPRAZOLE SODIUM 40 MG PO TBEC
40.0000 mg | DELAYED_RELEASE_TABLET | Freq: Every day | ORAL | 0 refills | Status: DC
  Filled 2022-04-30: qty 90, 90d supply, fill #0

## 2022-05-04 ENCOUNTER — Encounter: Payer: Self-pay | Admitting: Emergency Medicine

## 2022-05-05 ENCOUNTER — Other Ambulatory Visit: Payer: Self-pay

## 2022-05-05 ENCOUNTER — Other Ambulatory Visit: Payer: Self-pay | Admitting: Emergency Medicine

## 2022-05-05 ENCOUNTER — Other Ambulatory Visit (HOSPITAL_COMMUNITY): Payer: Self-pay

## 2022-05-05 ENCOUNTER — Encounter: Payer: Self-pay | Admitting: Oncology

## 2022-05-05 MED ORDER — WEGOVY 2.4 MG/0.75ML ~~LOC~~ SOAJ
2.4000 mg | SUBCUTANEOUS | 5 refills | Status: DC
  Filled 2022-05-05 – 2022-06-02 (×3): qty 3, 28d supply, fill #0
  Filled 2022-07-04: qty 3, 28d supply, fill #1
  Filled 2022-09-16: qty 3, 28d supply, fill #2
  Filled 2022-10-16: qty 3, 28d supply, fill #3
  Filled 2022-11-20: qty 3, 28d supply, fill #4
  Filled 2023-02-19: qty 3, 28d supply, fill #5

## 2022-05-05 NOTE — Telephone Encounter (Signed)
New prescription with new dosage sent to pharmacy of record today.  Thanks.

## 2022-05-07 ENCOUNTER — Other Ambulatory Visit (HOSPITAL_COMMUNITY): Payer: Self-pay

## 2022-05-07 ENCOUNTER — Telehealth: Payer: Self-pay | Admitting: *Deleted

## 2022-05-07 NOTE — Telephone Encounter (Signed)
Prior auth submitted for medication

## 2022-05-07 NOTE — Telephone Encounter (Signed)
PA for [facility] submitted, awaiting response Key: [facility]

## 2022-05-07 NOTE — Telephone Encounter (Signed)
Patient needs a prior auth through insurance for medication dosage.

## 2022-05-08 NOTE — Telephone Encounter (Signed)
PA for [employer] approved 

## 2022-05-13 ENCOUNTER — Other Ambulatory Visit (HOSPITAL_COMMUNITY): Payer: Self-pay

## 2022-06-02 ENCOUNTER — Other Ambulatory Visit: Payer: Self-pay | Admitting: Emergency Medicine

## 2022-06-02 ENCOUNTER — Other Ambulatory Visit (HOSPITAL_COMMUNITY): Payer: Self-pay

## 2022-06-02 DIAGNOSIS — M797 Fibromyalgia: Secondary | ICD-10-CM

## 2022-06-02 DIAGNOSIS — M792 Neuralgia and neuritis, unspecified: Secondary | ICD-10-CM

## 2022-06-02 MED ORDER — GABAPENTIN 300 MG PO CAPS
300.0000 mg | ORAL_CAPSULE | Freq: Four times a day (QID) | ORAL | 1 refills | Status: DC
  Filled 2022-06-02: qty 540, 68d supply, fill #0
  Filled 2022-10-16: qty 540, 68d supply, fill #1

## 2022-07-04 ENCOUNTER — Other Ambulatory Visit (HOSPITAL_COMMUNITY): Payer: Self-pay

## 2022-07-04 ENCOUNTER — Other Ambulatory Visit: Payer: Self-pay | Admitting: Cardiology

## 2022-07-04 MED ORDER — PROPRANOLOL HCL 60 MG PO TABS
60.0000 mg | ORAL_TABLET | Freq: Three times a day (TID) | ORAL | 3 refills | Status: DC
  Filled 2022-07-04: qty 270, 90d supply, fill #0
  Filled 2022-10-16: qty 270, 90d supply, fill #1
  Filled 2023-01-21: qty 270, 90d supply, fill #2
  Filled 2023-04-27: qty 270, 90d supply, fill #3

## 2022-07-17 ENCOUNTER — Encounter: Payer: Self-pay | Admitting: Oncology

## 2022-07-19 ENCOUNTER — Encounter: Payer: Self-pay | Admitting: Oncology

## 2022-08-13 ENCOUNTER — Other Ambulatory Visit (HOSPITAL_COMMUNITY): Payer: Self-pay

## 2022-08-13 ENCOUNTER — Other Ambulatory Visit: Payer: Self-pay

## 2022-08-13 ENCOUNTER — Other Ambulatory Visit: Payer: Self-pay | Admitting: Emergency Medicine

## 2022-08-13 DIAGNOSIS — Z8719 Personal history of other diseases of the digestive system: Secondary | ICD-10-CM

## 2022-08-13 DIAGNOSIS — F411 Generalized anxiety disorder: Secondary | ICD-10-CM

## 2022-08-13 DIAGNOSIS — G43909 Migraine, unspecified, not intractable, without status migrainosus: Secondary | ICD-10-CM

## 2022-08-13 DIAGNOSIS — M62838 Other muscle spasm: Secondary | ICD-10-CM

## 2022-08-13 MED ORDER — BUSPIRONE HCL 7.5 MG PO TABS
7.5000 mg | ORAL_TABLET | Freq: Two times a day (BID) | ORAL | 1 refills | Status: DC
  Filled 2022-08-13: qty 180, 90d supply, fill #0
  Filled 2022-12-07: qty 180, 90d supply, fill #1

## 2022-08-13 MED ORDER — CYCLOBENZAPRINE HCL 10 MG PO TABS
10.0000 mg | ORAL_TABLET | Freq: Three times a day (TID) | ORAL | 1 refills | Status: DC | PRN
  Filled 2022-08-13: qty 180, 60d supply, fill #0
  Filled 2022-11-20: qty 180, 60d supply, fill #1

## 2022-08-13 MED ORDER — PANTOPRAZOLE SODIUM 40 MG PO TBEC
40.0000 mg | DELAYED_RELEASE_TABLET | Freq: Every day | ORAL | 0 refills | Status: DC
  Filled 2022-08-13: qty 90, 90d supply, fill #0

## 2022-08-13 MED ORDER — TOPIRAMATE 50 MG PO TABS
50.0000 mg | ORAL_TABLET | Freq: Two times a day (BID) | ORAL | 1 refills | Status: DC
  Filled 2022-08-13: qty 180, 90d supply, fill #0
  Filled 2022-11-20: qty 180, 90d supply, fill #1

## 2022-08-21 ENCOUNTER — Telehealth: Payer: Commercial Managed Care - PPO | Admitting: Family Medicine

## 2022-08-21 DIAGNOSIS — U071 COVID-19: Secondary | ICD-10-CM

## 2022-08-21 MED ORDER — MOLNUPIRAVIR EUA 200MG CAPSULE: 4.0000 | ORAL_CAPSULE | Freq: Two times a day (BID) | ORAL | 0 refills | Status: AC

## 2022-08-21 MED ORDER — BENZONATATE 100 MG PO CAPS: 100.0000 mg | ORAL_CAPSULE | Freq: Two times a day (BID) | ORAL | 0 refills | Status: DC | PRN

## 2022-08-21 MED ORDER — ALBUTEROL SULFATE HFA 108 (90 BASE) MCG/ACT IN AERS: 1.0000 | INHALATION_SPRAY | RESPIRATORY_TRACT | 0 refills | Status: AC | PRN

## 2022-08-21 NOTE — Patient Instructions (Signed)
[name], thank you for joining [name], NP for today's virtual visit.  While this provider is not your primary care provider (PCP), if your PCP is located in our provider database this encounter information will be shared with them immediately following your visit.   [facility] account gives you access to today's visit and all your visits, tests, and labs performed at [facility] " click here if you don't have a [facility] account or go to [email or web address]  Consent: (Patient) DELESHA POHLMAN provided verbal consent for this virtual visit at the beginning of the encounter.  Current Medications:  Current Outpatient Medications:    benzonatate (TESSALON) 100 MG capsule, Take 1 capsule (100 mg total) by mouth 2 (two) times daily as needed for cough., Disp: 20 capsule, Rfl: 0   molnupiravir EUA (LAGEVRIO) 200 mg CAPS capsule, Take 4 capsules (800 mg total) by mouth 2 (two) times daily for 5 days., Disp: 40 capsule, Rfl: 0   Semaglutide-Weight Management (WEGOVY) 2.4 MG/0.75ML SOAJ, Inject 2.4 mg into the skin once a week., Disp: 3 mL, Rfl: 5   albuterol (PROAIR HFA) 108 (90 Base) MCG/ACT inhaler, Inhale 1-2 puffs into the lungs every 4 (four) hours as needed for wheezing or shortness of breath., Disp: 18 g, Rfl: 0   atorvastatin (LIPITOR) 20 MG tablet, Take 1 tablet (20 mg total) by mouth daily., Disp: 90 tablet, Rfl: 3   busPIRone (BUSPAR) 7.5 MG tablet, Take 1 tablet (7.5 mg total) by mouth 2 (two) times daily., Disp: 180 tablet, Rfl: 1   butalbital-acetaminophen-caffeine (FIORICET) 50-325-40 MG tablet, Take 1 tablet by mouth every 6 (six) hours as needed for headache., Disp: 20 tablet, Rfl: 1   cyclobenzaprine (FLEXERIL) 10 MG tablet, Take 1 tablet (10 mg total) by mouth 3 (three) times daily as needed for muscle spasms., Disp: 180 tablet, Rfl: 1   DULoxetine (CYMBALTA) 60 MG capsule, Take 1 capsule (60 mg total) by mouth daily., Disp: 90  capsule, Rfl: 1   fluconazole (DIFLUCAN) 150 MG tablet, Take one tablet on the day you fill the prescription. If you continue to have symptoms then take the second tablet in 3 days., Disp: 2 tablet, Rfl: 0   fluticasone (FLONASE) 50 MCG/ACT nasal spray, Place 1 spray into both nostrils daily., Disp: 16 g, Rfl: 6   furosemide (LASIX) 20 MG tablet, Take 1 tablet (20 mg total) by mouth 2 (two) times daily as needed., Disp: 135 tablet, Rfl: 4   gabapentin (NEURONTIN) 300 MG capsule, Take 1-2 capsules (300-600 mg total) by mouth 4 (four) times daily., Disp: 540 capsule, Rfl: 1   HYDROcodone bit-homatropine (HYCODAN) 5-1.5 MG/5ML syrup, Take 5 mLs by mouth at bedtime as needed for cough., Disp: 120 mL, Rfl: 0   lisinopril (ZESTRIL) 20 MG tablet, Take 1 tablet (20 mg total) by mouth daily., Disp: 90 tablet, Rfl: 1   ondansetron (ZOFRAN-ODT) 4 MG disintegrating tablet, Dissolve 1 tablet (4 mg total) by mouth every 8 (eight) hours as needed for nausea or vomiting., Disp: 20 tablet, Rfl: 4   pantoprazole (PROTONIX) 40 MG tablet, Take 1 tablet (40 mg total) by mouth daily., Disp: 90 tablet, Rfl: 0   propranolol (INDERAL) 60 MG tablet, Take 1 tablet (60 mg total) by mouth 3 (three) times daily., Disp: 270 tablet, Rfl: 3   topiramate (TOPAMAX) 50 MG tablet, Take 1 tablet (50 mg total) by mouth 2 (two) times daily., Disp: 180 tablet, Rfl: 1  vortioxetine HBr (TRINTELLIX) 10 MG TABS tablet, Take 1 tablet (10 mg total) by mouth daily., Disp: 90 tablet, Rfl: 1   Medications ordered in this encounter:  Meds ordered this encounter  Medications   molnupiravir EUA (LAGEVRIO) 200 mg CAPS capsule    Sig: Take 4 capsules (800 mg total) by mouth 2 (two) times daily for 5 days.    Dispense:  40 capsule    Refill:  0    Order Specific Question:   Supervising Provider    Answer:   Chase Picket [[id number]]   albuterol (PROAIR HFA) 108 (90 Base) MCG/ACT inhaler    Sig: Inhale 1-2 puffs into the lungs every 4 (four)  hours as needed for wheezing or shortness of breath.    Dispense:  18 g    Refill:  0    Order Specific Question:   Supervising Provider    Answer:   Chase Picket [[id number]]   benzonatate (TESSALON) 100 MG capsule    Sig: Take 1 capsule (100 mg total) by mouth 2 (two) times daily as needed for cough.    Dispense:  20 capsule    Refill:  0    Order Specific Question:   Supervising Provider    Answer:   Chase Picket [id number]     *If you need refills on other medications prior to your next appointment, please contact your pharmacy*  Follow-Up: Call back or seek an in-person evaluation if the symptoms worsen or if the condition fails to improve as anticipated.  [facility] [phone]  Other Instructions  - Continue OTC symptomatic management of choice   Please keep well-hydrated and get plenty of rest. Start a saline nasal rinse to flush out your nasal passages. You can use plain Mucinex to help thin congestion. If you have a humidifier, running in the bedroom at night. I want you to start OTC vitamin D3 1000 units daily, vitamin C 1000 mg daily, and a zinc supplement. Please take prescribed medications as directed.  You have been enrolled in a MyChart symptom monitoring program. Please answer these questions daily so we can keep track of how you are doing.  You were to quarantine for 5 days from onset of your symptoms.  After day 5, if you have had no fever and you are feeling better, you can end quarantine but need to mask for an additional 5 days. After day 5 if you have a fever or are having significant symptoms, please quarantine for full 10 days.  If you note any worsening of symptoms, any significant shortness of breath or any chest pain, please seek ER evaluation ASAP.  Please do not delay care!  COVID-19: What to Do if You Are Sick If you test positive and are an older adult or someone who is at high risk of getting very sick from COVID-19,  treatment may be available. Contact a healthcare provider right away after a positive test to determine if you are eligible, even if your symptoms are mild right now. You can also visit a Test to Treat location and, if eligible, receive a prescription from a provider. Don't delay: Treatment must be started within the first few days to be effective. If you have a fever, cough, or other symptoms, you might have COVID-19. Most people have mild illness and are able to recover at home. If you are sick: Keep track of your symptoms. If you have an emergency warning sign (including trouble breathing), call  911. Steps to help prevent the spread of COVID-19 if you are sick If you are sick with COVID-19 or think you might have COVID-19, follow the steps below to care for yourself and to help protect other people in your home and community. Stay home except to get medical care Stay home. Most people with COVID-19 have mild illness and can recover at home without medical care. Do not leave your home, except to get medical care. Do not visit public areas and do not go to places where you are unable to wear a mask. Take care of yourself. Get rest and stay hydrated. Take over-the-counter medicines, such as acetaminophen, to help you feel better. Stay in touch with your doctor. Call before you get medical care. Be sure to get care if you have trouble breathing, or have any other emergency warning signs, or if you think it is an emergency. Avoid public transportation, ride-sharing, or taxis if possible. Get tested If you have symptoms of COVID-19, get tested. While waiting for test results, stay away from others, including staying apart from those living in your household. Get tested as soon as possible after your symptoms start. Treatments may be available for people with COVID-19 who are at risk for becoming very sick. Don't delay: Treatment must be started early to be effective--some treatments must begin within 5  days of your first symptoms. Contact your healthcare provider right away if your test result is positive to determine if you are eligible. Self-tests are one of several options for testing for the virus that causes COVID-19 and may be more convenient than laboratory-based tests and point-of-care tests. Ask your healthcare provider or your local health department if you need help interpreting your test results. You can visit your state, tribal, local, and territorial health department's website to look for the latest local information on testing sites. Separate yourself from other people As much as possible, stay in a specific room and away from other people and pets in your home. If possible, you should use a separate bathroom. If you need to be around other people or animals in or outside of the home, wear a well-fitting mask. Tell your close contacts that they may have been exposed to COVID-19. An infected person can spread COVID-19 starting 48 hours (or 2 days) before the person has any symptoms or tests positive. By letting your close contacts know they may have been exposed to COVID-19, you are helping to protect everyone. See COVID-19 and Animals if you have questions about pets. If you are diagnosed with COVID-19, someone from the health department may call you. Answer the call to slow the spread. Monitor your symptoms Symptoms of COVID-19 include fever, cough, or other symptoms. Follow care instructions from your healthcare provider and local health department. Your local health authorities may give instructions on checking your symptoms and reporting information. When to seek emergency medical attention Look for emergency warning signs* for COVID-19. If someone is showing any of these signs, seek emergency medical care immediately: Trouble breathing Persistent pain or pressure in the chest New confusion Inability to wake or stay awake Pale, gray, or blue-colored skin, lips, or nail beds,  depending on skin tone *This list is not all possible symptoms. Please call your medical provider for any other symptoms that are severe or concerning to you. Call 911 or call ahead to your local emergency facility: Notify the operator that you are seeking care for someone who has or may have COVID-19. Call ahead before  visiting your doctor Call ahead. Many medical visits for routine care are being postponed or done by phone or telemedicine. If you have a medical appointment that cannot be postponed, call your doctor's office, and tell them you have or may have COVID-19. This will help the office protect themselves and other patients. If you are sick, wear a well-fitting mask You should wear a mask if you must be around other people or animals, including pets (even at home). Wear a mask with the best fit, protection, and comfort for you. You don't need to wear the mask if you are alone. If you can't put on a mask (because of trouble breathing, for example), cover your coughs and sneezes in some other way. Try to stay at least 6 feet away from other people. This will help protect the people around you. Masks should not be placed on young children under age 15 years, anyone who has trouble breathing, or anyone who is not able to remove the mask without help. Cover your coughs and sneezes Cover your mouth and nose with a tissue when you cough or sneeze. Throw away used tissues in a lined trash can. Immediately wash your hands with soap and water for at least 20 seconds. If soap and water are not available, clean your hands with an alcohol-based hand sanitizer that contains at least 60% alcohol. Clean your hands often Wash your hands often with soap and water for at least 20 seconds. This is especially important after blowing your nose, coughing, or sneezing; going to the bathroom; and before eating or preparing food. Use hand sanitizer if soap and water are not available. Use an alcohol-based hand  sanitizer with at least 60% alcohol, covering all surfaces of your hands and rubbing them together until they feel dry. Soap and water are the best option, especially if hands are visibly dirty. Avoid touching your eyes, nose, and mouth with unwashed hands. Handwashing Tips Avoid sharing personal household items Do not share dishes, drinking glasses, cups, eating utensils, towels, or bedding with other people in your home. Wash these items thoroughly after using them with soap and water or put in the dishwasher. Clean surfaces in your home regularly Clean and disinfect high-touch surfaces (for example, doorknobs, tables, handles, light switches, and countertops) in your "sick room" and bathroom. In shared spaces, you should clean and disinfect surfaces and items after each use by the person who is ill. If you are sick and cannot clean, a caregiver or other person should only clean and disinfect the area around you (such as your bedroom and bathroom) on an as needed basis. Your caregiver/other person should wait as long as possible (at least several hours) and wear a mask before entering, cleaning, and disinfecting shared spaces that you use. Clean and disinfect areas that may have blood, stool, or body fluids on them. Use household cleaners and disinfectants. Clean visible dirty surfaces with household cleaners containing soap or detergent. Then, use a household disinfectant. Use a product from [employer] List N: Disinfectants for Coronavirus (BJSEG-31). Be sure to follow the instructions on the label to ensure safe and effective use of the product. Many products recommend keeping the surface wet with a disinfectant for a certain period of time (look at "contact time" on the product label). You may also need to wear personal protective equipment, such as gloves, depending on the directions on the product label. Immediately after disinfecting, wash your hands with soap and water for 20 seconds. For  completed  guidance on cleaning and disinfecting your home, visit Complete Disinfection Guidance. Take steps to improve ventilation at home Improve ventilation (air flow) at home to help prevent from spreading COVID-19 to other people in your household. Clear out COVID-19 virus particles in the air by opening windows, using air filters, and turning on fans in your home. Use this interactive tool to learn how to improve air flow in your home. When you can be around others after being sick with COVID-19 Deciding when you can be around others is different for different situations. Find out when you can safely end home isolation. For any additional questions about your care, contact your healthcare provider or state or local health department. 10/23/2020 Content source: [facility] ([facility]), Division of Viral Diseases This information is not intended to replace advice given to you by your health care provider. Make sure you discuss any questions you have with your health care provider. Document Revised: 12/06/2020 Document Reviewed: 12/06/2020 Elsevier Patient Education  2022 [employer].      If you have been instructed to have an in-person evaluation today at a local Urgent Care facility, please use the link below. It will take you to a list of all of our available [facility], including address, phone number and hours of operation. Please do not delay care.  [facility]  If you or a family member do not have a primary care provider, use the link below to schedule a visit and establish care. When you choose a [facility] primary care physician or advanced practice provider, you gain a long-term partner in health. Find a Primary Care Provider  Learn more about [facility] in-office and virtual care options: [facility]

## 2022-08-21 NOTE — Progress Notes (Signed)
Virtual Visit Consent   [name], you are scheduled for a virtual visit with a [facility] provider today. Just as with appointments in the office, your consent must be obtained to participate. Your consent will be active for this visit and any virtual visit you may have with one of our providers in the next 365 days. If you have a MyChart account, a copy of this consent can be sent to you electronically.  As this is a virtual visit, video technology does not allow for your provider to perform a traditional examination. This may limit your provider's ability to fully assess your condition. If your provider identifies any concerns that need to be evaluated in person or the need to arrange testing (such as labs, EKG, etc.), we will make arrangements to do so. Although advances in technology are sophisticated, we cannot ensure that it will always work on either your end or our end. If the connection with a video visit is poor, the visit may have to be switched to a telephone visit. With either a video or telephone visit, we are not always able to ensure that we have a secure connection.  By engaging in this virtual visit, you consent to the provision of healthcare and authorize for your insurance to be billed (if applicable) for the services provided during this visit. Depending on your insurance coverage, you may receive a charge related to this service.  I need to obtain your verbal consent now. Are you willing to proceed with your visit today? [name] has provided verbal consent on 08/21/2022 for a virtual visit (video or telephone). [name], NP  Date: 08/21/2022 3:09 PM  Virtual Visit via Video Note   I, [name], connected with  [name]  ([id number], Jan 27, 1956) on 08/21/22 at  3:15 PM EST by a video-enabled telemedicine application and verified that I am speaking with the correct person using two identifiers.  Location: Patient: Virtual Visit Location Patient:  Home Provider: Virtual Visit Location Provider: Home Office   I discussed the limitations of evaluation and management by telemedicine and the availability of in person appointments. The patient expressed understanding and agreed to proceed.    History of Present Illness: [name] is a 67 y.o. who identifies as a female who was assigned female at birth, and is being seen today for COVID + on home test. Onset was yesterday started hoarseness and sore throat. Cough, congestion, body aches. Husband was COVID + over weekend. Has had COVID before and symptoms are presenting the same way. Reports chest tightness with taking a deep breath. Reports some shortness of breath- which is common to her even without COVID. Denies fevers, chills, ear pain.  Vaccines up to date- but has not gotten the most recent booster.   Problems:  Patient Active Problem List   Diagnosis Date Noted   Acute cough 03/26/2022   Lower respiratory infection 03/26/2022   Nausea without vomiting 03/26/2022   Prediabetes 11/18/2021   Major depression, recurrent, chronic ([place]) 10/01/2021   Hx of adenomatous colonic polyps 01/02/2021   Pulmonary arterial hypertension ([place]) 07/04/2020   Kidney cysts 07/04/2020   Hyperlipidemia 07/04/2020   Depression 07/04/2020   Iron deficiency anemia 10/13/2018   DOE (dyspnea on exertion) 03/23/2018   Obesity, morbid, BMI 40.0-49.9 ([place]) 03/23/2018   Chronic diastolic heart failure ([place]) 03/23/2018   Grade I diastolic dysfunction 84/13/2440   Migraine without aura and without status migrainosus, not intractable 03/02/2017  Class 3 severe obesity due to excess calories with serious comorbidity and body mass index (BMI) of 40.0 to 44.9 in adult [facility]) 01/13/2017   Hiatal hernia 10/28/2016   Pure hypercholesterolemia 10/28/2016   Chronic kidney disease 06/01/2015   Gastroesophageal reflux disease 06/01/2015   Goiter 06/01/2015   Essential hypertension 06/01/2015   Chronic pain  syndrome 05/28/2015   Fibromyalgia 05/28/2015   OSA (obstructive sleep apnea) 05/28/2015   Adjustment disorder with mixed anxiety and depressed mood 05/28/2015    Allergies:  Allergies  Allergen Reactions   Sulfa Antibiotics Itching   Medications:  Current Outpatient Medications:    Semaglutide-Weight Management (WEGOVY) 2.4 MG/0.75ML SOAJ, Inject 2.4 mg into the skin once a week., Disp: 3 mL, Rfl: 5   albuterol (PROAIR HFA) 108 (90 Base) MCG/ACT inhaler, Inhale 1-2 puffs into the lungs every 4 (four) hours as needed for wheezing or shortness of breath., Disp: 18 g, Rfl: 3   atorvastatin (LIPITOR) 20 MG tablet, Take 1 tablet (20 mg total) by mouth daily., Disp: 90 tablet, Rfl: 3   azithromycin (ZITHROMAX) 250 MG tablet, Take as directed., Disp: 6 tablet, Rfl: 0   busPIRone (BUSPAR) 7.5 MG tablet, Take 1 tablet (7.5 mg total) by mouth 2 (two) times daily., Disp: 180 tablet, Rfl: 1   butalbital-acetaminophen-caffeine (FIORICET) 50-325-40 MG tablet, Take 1 tablet by mouth every 6 (six) hours as needed for headache., Disp: 20 tablet, Rfl: 1   cyclobenzaprine (FLEXERIL) 10 MG tablet, Take 1 tablet (10 mg total) by mouth 3 (three) times daily as needed for muscle spasms., Disp: 180 tablet, Rfl: 1   DULoxetine (CYMBALTA) 60 MG capsule, Take 1 capsule (60 mg total) by mouth daily., Disp: 90 capsule, Rfl: 1   fluconazole (DIFLUCAN) 150 MG tablet, Take one tablet on the day you fill the prescription. If you continue to have symptoms then take the second tablet in 3 days., Disp: 2 tablet, Rfl: 0   fluticasone (FLONASE) 50 MCG/ACT nasal spray, Place 1 spray into both nostrils daily., Disp: 16 g, Rfl: 6   furosemide (LASIX) 20 MG tablet, Take 1 tablet (20 mg total) by mouth 2 (two) times daily as needed., Disp: 135 tablet, Rfl: 4   gabapentin (NEURONTIN) 300 MG capsule, Take 1-2 capsules (300-600 mg total) by mouth 4 (four) times daily., Disp: 540 capsule, Rfl: 1   HYDROcodone bit-homatropine (HYCODAN)  5-1.5 MG/5ML syrup, Take 5 mLs by mouth at bedtime as needed for cough., Disp: 120 mL, Rfl: 0   lisinopril (ZESTRIL) 20 MG tablet, Take 1 tablet (20 mg total) by mouth daily., Disp: 90 tablet, Rfl: 1   ondansetron (ZOFRAN-ODT) 4 MG disintegrating tablet, Dissolve 1 tablet (4 mg total) by mouth every 8 (eight) hours as needed for nausea or vomiting., Disp: 20 tablet, Rfl: 4   pantoprazole (PROTONIX) 40 MG tablet, Take 1 tablet (40 mg total) by mouth daily., Disp: 90 tablet, Rfl: 0   propranolol (INDERAL) 60 MG tablet, Take 1 tablet (60 mg total) by mouth 3 (three) times daily., Disp: 270 tablet, Rfl: 3   topiramate (TOPAMAX) 50 MG tablet, Take 1 tablet (50 mg total) by mouth 2 (two) times daily., Disp: 180 tablet, Rfl: 1   valACYclovir (VALTREX) 1000 MG tablet, Take 2 tablets (2000 mg) initally, then 1 tablet (1000 mg) every 12 hours until resolved, no more than 10 days (Patient not taking: Reported on 10/01/2021), Disp: 20 tablet, Rfl: 0   vortioxetine HBr (TRINTELLIX) 10 MG TABS tablet, Take 1 tablet (10  mg total) by mouth daily., Disp: 90 tablet, Rfl: 1  Observations/Objective: Patient is well-developed, well-nourished in no acute distress.  Resting comfortably  at home.  Head is normocephalic, atraumatic.  No labored breathing.  Speech is clear and coherent with logical content.  Patient is alert and oriented at baseline.    Assessment and Plan:  1. COVID-19  - molnupiravir EUA (LAGEVRIO) 200 mg CAPS capsule; Take 4 capsules (800 mg total) by mouth 2 (two) times daily for 5 days.  Dispense: 40 capsule; Refill: 0 - albuterol (PROAIR HFA) 108 (90 Base) MCG/ACT inhaler; Inhale 1-2 puffs into the lungs every 4 (four) hours as needed for wheezing or shortness of breath.  Dispense: 18 g; Refill: 0 - benzonatate (TESSALON) 100 MG capsule; Take 1 capsule (100 mg total) by mouth 2 (two) times daily as needed for cough.  Dispense: 20 capsule; Refill: 0  - Continue OTC symptomatic management of  choice - Will send OTC vitamins and supplement information through AVS - Take meds as prescribed - Patient enrolled in MyChart symptom monitoring - Push fluids - Rest as needed - Discussed return precautions and when to seek in-person evaluation, sent via AVS as well   Reviewed side effects, risks and benefits of medication.    Patient acknowledged agreement and understanding of the plan.   Past Medical, Surgical, Social History, Allergies, and Medications have been Reviewed.    Follow Up Instructions: I discussed the assessment and treatment plan with the patient. The patient was provided an opportunity to ask questions and all were answered. The patient agreed with the plan and demonstrated an understanding of the instructions.  A copy of instructions were sent to the patient via MyChart unless otherwise noted below.    The patient was advised to call back or seek an in-person evaluation if the symptoms worsen or if the condition fails to improve as anticipated.  Time:  I spent 10 minutes with the patient via telehealth technology discussing the above problems/concerns.    [name], NP

## 2022-09-16 ENCOUNTER — Other Ambulatory Visit: Payer: Self-pay | Admitting: Emergency Medicine

## 2022-09-16 ENCOUNTER — Other Ambulatory Visit (HOSPITAL_COMMUNITY): Payer: Self-pay

## 2022-09-16 DIAGNOSIS — F32A Depression, unspecified: Secondary | ICD-10-CM

## 2022-09-16 DIAGNOSIS — M797 Fibromyalgia: Secondary | ICD-10-CM

## 2022-09-16 DIAGNOSIS — R110 Nausea: Secondary | ICD-10-CM

## 2022-09-16 DIAGNOSIS — E785 Hyperlipidemia, unspecified: Secondary | ICD-10-CM

## 2022-09-16 MED ORDER — DULOXETINE HCL 60 MG PO CPEP
60.0000 mg | ORAL_CAPSULE | Freq: Every day | ORAL | 1 refills | Status: DC
  Filled 2022-09-16: qty 90, 90d supply, fill #0
  Filled 2022-12-20: qty 90, 90d supply, fill #1

## 2022-09-16 MED ORDER — ATORVASTATIN CALCIUM 20 MG PO TABS
20.0000 mg | ORAL_TABLET | Freq: Every day | ORAL | 3 refills | Status: DC
  Filled 2022-09-16: qty 90, 90d supply, fill #0
  Filled 2022-12-20: qty 90, 90d supply, fill #1
  Filled 2023-03-24: qty 90, 90d supply, fill #2
  Filled 2023-07-01: qty 90, 90d supply, fill #3

## 2022-09-16 MED ORDER — ONDANSETRON 4 MG PO TBDP
4.0000 mg | ORAL_TABLET | Freq: Three times a day (TID) | ORAL | 4 refills | Status: DC | PRN
  Filled 2022-09-16: qty 20, 7d supply, fill #0
  Filled 2022-10-16: qty 20, 7d supply, fill #1
  Filled 2022-11-20: qty 20, 7d supply, fill #2
  Filled 2022-12-07: qty 20, 7d supply, fill #3
  Filled 2023-01-21: qty 20, 7d supply, fill #4

## 2022-09-17 ENCOUNTER — Other Ambulatory Visit (HOSPITAL_COMMUNITY): Payer: Self-pay

## 2022-09-17 MED ORDER — FLUAD QUADRIVALENT 0.5 ML IM PRSY
0.5000 mL | PREFILLED_SYRINGE | INTRAMUSCULAR | 0 refills | Status: DC
  Filled 2022-09-17: qty 0.5, 1d supply, fill #0

## 2022-09-18 ENCOUNTER — Encounter: Payer: Self-pay | Admitting: Oncology

## 2022-09-18 ENCOUNTER — Other Ambulatory Visit (HOSPITAL_COMMUNITY): Payer: Self-pay

## 2022-10-16 ENCOUNTER — Other Ambulatory Visit: Payer: Self-pay

## 2022-10-16 ENCOUNTER — Other Ambulatory Visit (HOSPITAL_COMMUNITY): Payer: Self-pay

## 2022-10-16 ENCOUNTER — Other Ambulatory Visit: Payer: Self-pay | Admitting: Emergency Medicine

## 2022-10-16 DIAGNOSIS — I10 Essential (primary) hypertension: Secondary | ICD-10-CM

## 2022-10-16 DIAGNOSIS — F339 Major depressive disorder, recurrent, unspecified: Secondary | ICD-10-CM

## 2022-10-16 MED ORDER — VORTIOXETINE HBR 10 MG PO TABS
10.0000 mg | ORAL_TABLET | Freq: Every day | ORAL | 1 refills | Status: DC
  Filled 2022-10-16: qty 90, 90d supply, fill #0
  Filled 2023-01-21: qty 90, 90d supply, fill #1

## 2022-10-16 MED ORDER — LISINOPRIL 20 MG PO TABS
20.0000 mg | ORAL_TABLET | Freq: Every day | ORAL | 1 refills | Status: DC
  Filled 2022-10-16: qty 90, 90d supply, fill #0
  Filled 2023-01-21: qty 90, 90d supply, fill #1

## 2022-10-17 ENCOUNTER — Other Ambulatory Visit (HOSPITAL_COMMUNITY): Payer: Self-pay

## 2022-10-17 ENCOUNTER — Other Ambulatory Visit: Payer: Self-pay

## 2022-10-22 ENCOUNTER — Ambulatory Visit (INDEPENDENT_AMBULATORY_CARE_PROVIDER_SITE_OTHER): Payer: Commercial Managed Care - PPO | Admitting: Emergency Medicine

## 2022-10-22 ENCOUNTER — Encounter: Payer: Self-pay | Admitting: Emergency Medicine

## 2022-10-22 VITALS — BP 132/76 | HR 70 | Temp 98.2°F | Ht 64.0 in | Wt 177.4 lb

## 2022-10-22 DIAGNOSIS — Z13220 Encounter for screening for lipoid disorders: Secondary | ICD-10-CM

## 2022-10-22 DIAGNOSIS — I5032 Chronic diastolic (congestive) heart failure: Secondary | ICD-10-CM

## 2022-10-22 DIAGNOSIS — K219 Gastro-esophageal reflux disease without esophagitis: Secondary | ICD-10-CM | POA: Diagnosis not present

## 2022-10-22 DIAGNOSIS — R7303 Prediabetes: Secondary | ICD-10-CM

## 2022-10-22 DIAGNOSIS — N1831 Chronic kidney disease, stage 3a: Secondary | ICD-10-CM

## 2022-10-22 DIAGNOSIS — I10 Essential (primary) hypertension: Secondary | ICD-10-CM

## 2022-10-22 DIAGNOSIS — E049 Nontoxic goiter, unspecified: Secondary | ICD-10-CM

## 2022-10-22 DIAGNOSIS — Z130 Encounter for screening for diseases of the blood and blood-forming organs and certain disorders involving the immune mechanism: Secondary | ICD-10-CM

## 2022-10-22 DIAGNOSIS — G9331 Postviral fatigue syndrome: Secondary | ICD-10-CM | POA: Diagnosis not present

## 2022-10-22 DIAGNOSIS — F4323 Adjustment disorder with mixed anxiety and depressed mood: Secondary | ICD-10-CM

## 2022-10-22 DIAGNOSIS — G4733 Obstructive sleep apnea (adult) (pediatric): Secondary | ICD-10-CM | POA: Diagnosis not present

## 2022-10-22 DIAGNOSIS — Z13228 Encounter for screening for other metabolic disorders: Secondary | ICD-10-CM | POA: Diagnosis not present

## 2022-10-22 DIAGNOSIS — Z0000 Encounter for general adult medical examination without abnormal findings: Secondary | ICD-10-CM | POA: Diagnosis not present

## 2022-10-22 DIAGNOSIS — Z13820 Encounter for screening for osteoporosis: Secondary | ICD-10-CM

## 2022-10-22 LAB — LIPID PANEL
Cholesterol: 136 mg/dL (ref 0–200)
HDL: 52.3 mg/dL (ref >39.00)
LDL Cholesterol: 64 mg/dL (ref 0–99)
NonHDL: 83.83
Total CHOL/HDL Ratio: 3
Triglycerides: 101 mg/dL (ref 0.0–149.0)
VLDL: 20.2 mg/dL (ref 0.0–40.0)

## 2022-10-22 LAB — COMPREHENSIVE METABOLIC PANEL
ALT: 9 U/L (ref 0–35)
AST: 13 U/L (ref 0–37)
Albumin: 3.8 g/dL (ref 3.5–5.2)
Alkaline Phosphatase: 51 U/L (ref 39–117)
BUN: 11 mg/dL (ref 6–23)
CO2: 24 mEq/L (ref 19–32)
Calcium: 9.3 mg/dL (ref 8.4–10.5)
Chloride: 109 mEq/L (ref 96–112)
Creatinine, Ser: 1.14 mg/dL (ref 0.40–1.20)
GFR: 50.11 mL/min — ABNORMAL LOW (ref >60.00)
Glucose, Bld: 89 mg/dL (ref 70–99)
Potassium: 3.9 mEq/L (ref 3.5–5.1)
Sodium: 139 mEq/L (ref 135–145)
Total Bilirubin: 0.4 mg/dL (ref 0.2–1.2)
Total Protein: 6.6 g/dL (ref 6.0–8.3)

## 2022-10-22 LAB — CBC WITH DIFFERENTIAL/PLATELET
Basophils Absolute: 0 10*3/uL (ref 0.0–0.1)
Basophils Relative: 0.2 % (ref 0.0–3.0)
Eosinophils Absolute: 0.1 10*3/uL (ref 0.0–0.7)
Eosinophils Relative: 2 % (ref 0.0–5.0)
HCT: 39.4 % (ref 36.0–46.0)
Hemoglobin: 13.2 g/dL (ref 12.0–15.0)
Lymphocytes Relative: 26.8 % (ref 12.0–46.0)
Lymphs Abs: 1.5 10*3/uL (ref 0.7–4.0)
MCHC: 33.4 g/dL (ref 30.0–36.0)
MCV: 91.7 fl (ref 78.0–100.0)
Monocytes Absolute: 0.4 10*3/uL (ref 0.1–1.0)
Monocytes Relative: 7.3 % (ref 3.0–12.0)
Neutro Abs: 3.6 10*3/uL (ref 1.4–7.7)
Neutrophils Relative %: 63.7 % (ref 43.0–77.0)
Platelets: 195 10*3/uL (ref 150.0–400.0)
RBC: 4.3 Mil/uL (ref 3.87–5.11)
RDW: 13.7 % (ref 11.5–15.5)
WBC: 5.7 10*3/uL (ref 4.0–10.5)

## 2022-10-22 LAB — TSH: TSH: 0.76 u[IU]/mL (ref 0.35–5.50)

## 2022-10-22 LAB — HEMOGLOBIN A1C: Hgb A1c MFr Bld: 5.5 % (ref 4.6–6.5)

## 2022-10-22 NOTE — Assessment & Plan Note (Signed)
No signs of congestive heart failure Last echocardiogram report reviewed Still has some dyspnea on exertion Not taking furosemide

## 2022-10-22 NOTE — Assessment & Plan Note (Signed)
Well-controlled hypertension Continue lisinopril 20 mg daily and propranolol 60 mg 3 times a day Cardiovascular risk associated with hypertension discussed

## 2022-10-22 NOTE — Assessment & Plan Note (Signed)
Stable and much improved. 

## 2022-10-22 NOTE — Assessment & Plan Note (Signed)
Diet and nutrition discussed.  Hemoglobin A1c done today. 

## 2022-10-22 NOTE — Patient Instructions (Signed)

## 2022-10-22 NOTE — Progress Notes (Signed)
Wt Readings from Last 3 Encounters:  10/22/22 177 lb 6 oz (80.5 kg)  03/26/22 200 lb 6 oz (90.9 kg)  11/18/21 219 lb 6 oz (99.5 kg)   [name] 67 y.o.   Chief Complaint  Patient presents with   Annual Exam    Patient having some back issues,pain Fatigue/lack of energy     HISTORY OF PRESENT ILLNESS: This is a 67 y.o. female here for annual exam. Has occasional upper back pains Occasional fatigue and lack of energy since having COVID infection late January into February Otherwise doing well.  Continues to lose weight on Wegovy. No other complaints or medical concerns today.  HPI   Prior to Admission medications   Medication Sig Start Date End Date Taking? Authorizing Provider  albuterol (PROAIR HFA) 108 (90 Base) MCG/ACT inhaler Inhale 1-2 puffs into the lungs every 4 (four) hours as needed for wheezing or shortness of breath. 08/21/22  Yes [name], NP  atorvastatin (LIPITOR) 20 MG tablet Take 1 tablet (20 mg total) by mouth daily. 09/16/22  Yes Allyssa Abruzzese, Ines Bloomer, MD  busPIRone (BUSPAR) 7.5 MG tablet Take 1 tablet (7.5 mg total) by mouth 2 (two) times daily. 08/13/22  Yes Danilyn Cocke, Ines Bloomer, MD  butalbital-acetaminophen-caffeine [facility]) [phone] MG tablet Take 1 tablet by mouth every 6 (six) hours as needed for headache. 12/25/20  Yes Elisheba Mcdonnell, Ines Bloomer, MD  cyclobenzaprine (FLEXERIL) 10 MG tablet Take 1 tablet (10 mg total) by mouth 3 (three) times daily as needed for muscle spasms. 08/13/22  Yes Masahiro Iglesia, Ines Bloomer, MD  DULoxetine (CYMBALTA) 60 MG capsule Take 1 capsule (60 mg total) by mouth daily. 09/16/22  Yes Shamara Soza, Ines Bloomer, MD  fluticasone [facility]) 50 MCG/ACT nasal spray Place 1 spray into both nostrils daily. 02/02/22  Yes Diera Wirkkala, Ines Bloomer, MD  gabapentin (NEURONTIN) 300 MG capsule Take 1-2 capsules (300-600 mg total) by mouth 4 (four) times daily. 06/02/22  Yes Saad Buhl, Ines Bloomer, MD  lisinopril (ZESTRIL) 20 MG tablet Take 1 tablet  (20 mg total) by mouth daily. 10/16/22  Yes Tekla Malachowski, Ines Bloomer, MD  ondansetron (ZOFRAN-ODT) 4 MG disintegrating tablet Dissolve 1 tablet (4 mg total) by mouth every 8 (eight) hours as needed for nausea or vomiting. 09/16/22  Yes Yitzhak Awan, Ines Bloomer, MD  pantoprazole (PROTONIX) 40 MG tablet Take 1 tablet (40 mg total) by mouth daily. 08/13/22  Yes Kalman Nylen, Ines Bloomer, MD  propranolol (INDERAL) 60 MG tablet Take 1 tablet (60 mg total) by mouth 3 (three) times daily. 07/04/22  Yes Leonie Man, MD  Semaglutide-Weight Management (WEGOVY) 2.4 MG/0.75ML SOAJ Inject 2.4 mg into the skin once a week. 05/05/22  Yes Perpetua Elling, Ines Bloomer, MD  topiramate (TOPAMAX) 50 MG tablet Take 1 tablet (50 mg total) by mouth 2 (two) times daily. 08/13/22  Yes Luticia Tadros, Ines Bloomer, MD  vortioxetine HBr (TRINTELLIX) 10 MG TABS tablet Take 1 tablet (10 mg total) by mouth daily. 10/16/22  Yes Joylene Wescott, Ines Bloomer, MD  furosemide (LASIX) 20 MG tablet Take 1 tablet (20 mg total) by mouth 2 (two) times daily as needed. Patient not taking: Reported on 10/22/2022 09/13/19   Jacelyn Pi, Lilia Argue, MD    Allergies  Allergen Reactions   Sulfa Antibiotics Itching    Patient Active Problem List   Diagnosis Date Noted   Nausea without vomiting 03/26/2022   Prediabetes 11/18/2021   Major depression, recurrent, chronic ([place]) 10/01/2021   Hx of adenomatous colonic polyps 01/02/2021   Pulmonary arterial  hypertension ([place]) 07/04/2020   Kidney cysts 07/04/2020   Hyperlipidemia 07/04/2020   Depression 07/04/2020   Iron deficiency anemia 10/13/2018   DOE (dyspnea on exertion) 03/23/2018   Obesity, morbid, BMI 40.0-49.9 ([place]) 03/23/2018   Chronic diastolic heart failure ([place]) 03/23/2018   Grade I diastolic dysfunction 0000000   Migraine without aura and without status migrainosus, not intractable 03/02/2017   Class 3 severe obesity due to excess calories with serious comorbidity and body mass index (BMI) of 40.0 to 44.9 in  adult ([place]) 01/13/2017   Hiatal hernia 10/28/2016   Pure hypercholesterolemia 10/28/2016   Chronic kidney disease 06/01/2015   Gastroesophageal reflux disease 06/01/2015   Goiter 06/01/2015   Essential hypertension 06/01/2015   Chronic pain syndrome 05/28/2015   Fibromyalgia 05/28/2015   OSA (obstructive sleep apnea) 05/28/2015   Adjustment disorder with mixed anxiety and depressed mood 05/28/2015    Past Medical History:  Diagnosis Date   Allergy    [name], Flonase   Anemia    Anxiety    Arthritis    Chronic kidney disease    Colon polyps    Depression    Fibromyalgia    Gastritis    GERD (gastroesophageal reflux disease)    Hernia, hiatal    High risk for colon cancer    HLD (hyperlipidemia)    Hypertension    Migraine    Neuropathy    Osteoporosis    Pneumonia    Sleep apnea    c-pap nightly   Sleep apnea with use of continuous positive airway pressure (CPAP)    Thyroid goiter     Past Surgical History:  Procedure Laterality Date   48-Hour Holter Monitor  06/2017   Mostly normal sinus rhythm.  Average heart rate 71 bpm.  Minimum 54 bpm.  Maximum sinus rate 114 bpm.  Rare PACs and PVCs.  No A. fib, atrial flutter, SVT or VT.  One brief run of 8 beat PAT.   ABDOMINAL HYSTERECTOMY     ovaries intact; DUB.  No dysplasia.   FRACTURE SURGERY N/A    [employer]rees[employer] 09/25/2020   JOINT REPLACEMENT     OPEN REDUCTION INTERNAL FIXATION (ORIF) DISTAL RADIAL FRACTURE Right 07/09/2017   Procedure: RIGHT WRIST OPEN REDUCTION INTERNAL FIXATION (ORIF) DISTAL RADIAL FRACTURE AND REPAIR AS INDICATED;  Surgeon: Iran Planas, MD;  Location: [place];  Service: Orthopedics;  Laterality: Right;   PARTIAL HYSTERECTOMY     ROTATOR CUFF REPAIR Right 2009   SHOULDER SURGERY Right    shoulder dislocation, fall, and elbow unla nerve   SHOULDER SURGERY Right 2015   labral tear   TRANSTHORACIC ECHOCARDIOGRAM  06/2017    EF 55-60%.  grade 1 diastolic dysfunction.  Otherwise normal   TUBAL  LIGATION     ULNAR NERVE REPAIR Right 08/19/2014    Social History   Socioeconomic History   Marital status: Married    Spouse name: Not on file   Number of children: 3   Years of education: Not on file   Highest education level: Not on file  Occupational History   Occupation: unemployed  Tobacco Use   Smoking status: Former    Types: Cigarettes    Quit date: 08/04/1998    Years since quitting: 24.2   Smokeless tobacco: Never  Vaping Use   Vaping Use: Never used  Substance and Sexual Activity   Alcohol use: No   Drug use: No   Sexual activity: Yes    Birth control/protection: Surgical, Post-menopausal  Other  Topics Concern   Not on file  Social History Narrative   Marital status: married x 20 years; moderately happy     Children: 3 children (47 [name], 60 Dianna, 40 April); 8 grandchildren; 0 gg     Lives: with husband/Steve, April, 2 granddaughters     Employment:  Unemployed; quit working 2015 with work related injury; awaiting disability in 2018; disability approved in 02/2017.       Tobacco: quit smoking in 2016; smoked x 2 years.      Alcohol: none      Drugs: none      Exercise: rarely in 2018         Social Determinants of Health   Financial Resource Strain: Not on file  Food Insecurity: Not on file  Transportation Needs: Not on file  Physical Activity: Not on file  Stress: Not on file  Social Connections: Not on file  Intimate Partner Violence: Not on file    Family History  Problem Relation Age of Onset   Heart disease Mother 63       AMI/CAD/CHF as cause of death   Hypertension Mother    Hyperlipidemia Mother    Hypothyroidism Mother    Depression Mother    Gout Mother    Heart disease Father 44       AMI   Hypertension Father    Stroke Father 74       mild CVA   Prostate cancer Father    Hyperlipidemia Father    Cancer Father 30       prostate cancer   Hypertension Brother    Hyperlipidemia Brother    Cancer Brother        prostate  cancer   Cancer Maternal Grandmother        type unknown   Cancer Maternal Grandfather        type unknown   Heart disease Paternal Grandmother    Heart defect Paternal Grandfather    Melanoma Brother    Hyperlipidemia Brother    Hypertension Brother    Cancer Brother        melanoma   Hypertension Brother    Hyperlipidemia Brother    Melanoma Brother    Cancer Brother 60       melanoma   Cancer Sister        Basal cell carcinoma scalp   Depression Sister    Hypertension Brother    Depression Brother    Hypertension Brother    Gout Brother    Arthritis Sister    Depression Sister    Colon cancer Neg Hx    Esophageal cancer Neg Hx    Stomach cancer Neg Hx    Rectal cancer Neg Hx      Review of Systems  Constitutional:  Positive for malaise/fatigue. Negative for chills and fever.  HENT: Negative.  Negative for congestion and sore throat.   Respiratory: Negative.  Negative for cough and shortness of breath.   Cardiovascular: Negative.  Negative for chest pain and palpitations.  Gastrointestinal:  Negative for abdominal pain, diarrhea, nausea and vomiting.  Genitourinary: Negative.  Negative for dysuria and hematuria.  Skin: Negative.  Negative for rash.  Neurological: Negative.  Negative for dizziness and headaches.    Vitals:   10/22/22 1403  BP: 132/76  Pulse: 70  Temp: 98.2 F (36.8 C)  SpO2: 95%    Physical Exam Constitutional:      Appearance: Normal appearance.  HENT:  Head: Normocephalic.     Right Ear: Tympanic membrane, ear canal and external ear normal.     Left Ear: Tympanic membrane, ear canal and external ear normal.     Mouth/Throat:     Mouth: Mucous membranes are moist.     Pharynx: Oropharynx is clear.  Eyes:     Extraocular Movements: Extraocular movements intact.     Conjunctiva/sclera: Conjunctivae normal.     Pupils: Pupils are equal, round, and reactive to light.  Cardiovascular:     Rate and Rhythm: Normal rate and regular  rhythm.     Pulses: Normal pulses.     Heart sounds: Normal heart sounds.  Pulmonary:     Effort: Pulmonary effort is normal.     Breath sounds: Normal breath sounds.  Abdominal:     Palpations: Abdomen is soft.     Tenderness: There is no abdominal tenderness.  Musculoskeletal:     Cervical back: No tenderness.     Right lower leg: No edema.     Left lower leg: No edema.  Lymphadenopathy:     Cervical: No cervical adenopathy.  Skin:    General: Skin is warm and dry.  Neurological:     General: No focal deficit present.     Mental Status: She is alert and oriented to person, place, and time.  Psychiatric:        Mood and Affect: Mood normal.        Behavior: Behavior normal.      ASSESSMENT & PLAN: Problem List Items Addressed This Visit       Cardiovascular and Mediastinum   Essential hypertension (Chronic)    Well-controlled hypertension Continue lisinopril 20 mg daily and propranolol 60 mg 3 times a day Cardiovascular risk associated with hypertension discussed      Chronic diastolic heart failure (HCC) (Chronic)    No signs of congestive heart failure Last echocardiogram report reviewed Still has some dyspnea on exertion Not taking furosemide        Respiratory   OSA (obstructive sleep apnea)    Stable.  Not on CPAP treatment for the last year        Digestive   Gastroesophageal reflux disease    Stable and asymptomatic at present time Only takes Protonix 40 mg as needed        Endocrine   Goiter    Not present on physical examination TSH done today Clinically euthyroid        Genitourinary   Chronic kidney disease    Stable.  Advised to stay well-hydrated and avoid NSAIDs         Other   Adjustment disorder with mixed anxiety and depressed mood    Stable and much improved.      Obesity, morbid, BMI 40.0-49.9 (HCC)    Eating better and losing weight Diet and nutrition discussed Continues Wegovy 2.4 mg weekly Will soon no longer be  covered by insurance Wt Readings from Last 3 Encounters:  10/22/22 177 lb 6 oz (80.5 kg)  03/26/22 200 lb 6 oz (90.9 kg)  11/18/21 219 lb 6 oz (99.5 kg)        Prediabetes    Diet and nutrition discussed Hemoglobin A1c done today      Postviral fatigue syndrome    Fatigue differential diagnosis discussed Needs diagnostic blood work Most likely secondary to recent COVID infection Postviral syndrome      Other Visit Diagnoses     Routine general medical examination at  a health care facility    -  Primary   Relevant Orders   CBC with Differential   Comprehensive metabolic panel   Hemoglobin A1c   Lipid panel   Osteoporosis screening       Relevant Orders   DG Bone Density   Screening for deficiency anemia       Relevant Orders   CBC with Differential   Screening for lipoid disorders       Relevant Orders   Lipid panel   Screening for endocrine, metabolic and immunity disorder       Relevant Orders   Comprehensive metabolic panel   Hemoglobin A1c   TSH      Modifiable risk factors discussed with patient. Anticipatory guidance according to age provided. The following topics were also discussed: Social Determinants of Health Smoking.  Non-smoker Diet and nutrition.  Eating better and losing weight Benefits of exercise Cancer screening and review of most recent mammogram and colonoscopy reports Vaccinations reviewed and recommendations Cardiovascular risk assessment The 10-year ASCVD risk score (Arnett DK, et al., 2019) is: 7.5%   Values used to calculate the score:     Age: 11 years     Sex: Female     Is Non-Hispanic African American: No     Diabetic: No     Tobacco smoker: No     Systolic Blood Pressure: [id number] mmHg     Is BP treated: Yes     HDL Cholesterol: 56.2 mg/dL     Total Cholesterol: 146 mg/dL Review of multiple chronic medical conditions Review of all medications Mental health including depression and anxiety Fall and accident  prevention  Patient Instructions  Health Maintenance, Female Adopting a healthy lifestyle and getting preventive care are important in promoting health and wellness. Ask your health care provider about: The right schedule for you to have regular tests and exams. Things you can do on your own to prevent diseases and keep yourself healthy. What should I know about diet, weight, and exercise? Eat a healthy diet  Eat a diet that includes plenty of vegetables, fruits, low-fat dairy products, and lean protein. Do not eat a lot of foods that are high in solid fats, added sugars, or sodium. Maintain a healthy weight Body mass index (BMI) is used to identify weight problems. It estimates body fat based on height and weight. Your health care provider can help determine your BMI and help you achieve or maintain a healthy weight. Get regular exercise Get regular exercise. This is one of the most important things you can do for your health. Most adults should: Exercise for at least 150 minutes each week. The exercise should increase your heart rate and make you sweat (moderate-intensity exercise). Do strengthening exercises at least twice a week. This is in addition to the moderate-intensity exercise. Spend less time sitting. Even light physical activity can be beneficial. Watch cholesterol and blood lipids Have your blood tested for lipids and cholesterol at 67 years of age, then have this test every 5 years. Have your cholesterol levels checked more often if: Your lipid or cholesterol levels are high. You are older than 67 years of age. You are at high risk for heart disease. What should I know about cancer screening? Depending on your health history and family history, you may need to have cancer screening at various ages. This may include screening for: Breast cancer. Cervical cancer. Colorectal cancer. Skin cancer. Lung cancer. What should I know about heart disease, diabetes, and  high blood  pressure? Blood pressure and heart disease High blood pressure causes heart disease and increases the risk of stroke. This is more likely to develop in people who have high blood pressure readings or are overweight. Have your blood pressure checked: Every 3-5 years if you are 63-2 years of age. Every year if you are 59 years old or older. Diabetes Have regular diabetes screenings. This checks your fasting blood sugar level. Have the screening done: Once every three years after age 17 if you are at a normal weight and have a low risk for diabetes. More often and at a younger age if you are overweight or have a high risk for diabetes. What should I know about preventing infection? Hepatitis B If you have a higher risk for hepatitis B, you should be screened for this virus. Talk with your health care provider to find out if you are at risk for hepatitis B infection. Hepatitis C Testing is recommended for: Everyone born from [birth year] through 1965. Anyone with known risk factors for hepatitis C. Sexually transmitted infections (STIs) Get screened for STIs, including gonorrhea and chlamydia, if: You are sexually active and are younger than 67 years of age. You are older than 67 years of age and your health care provider tells you that you are at risk for this type of infection. Your sexual activity has changed since you were last screened, and you are at increased risk for chlamydia or gonorrhea. Ask your health care provider if you are at risk. Ask your health care provider about whether you are at high risk for HIV. Your health care provider may recommend a prescription medicine to help prevent HIV infection. If you choose to take medicine to prevent HIV, you should first get tested for HIV. You should then be tested every 3 months for as long as you are taking the medicine. Pregnancy If you are about to stop having your period (premenopausal) and you may become pregnant, seek counseling before you  get pregnant. Take 400 to 800 micrograms (mcg) of folic acid every day if you become pregnant. Ask for birth control (contraception) if you want to prevent pregnancy. Osteoporosis and menopause Osteoporosis is a disease in which the bones lose minerals and strength with aging. This can result in bone fractures. If you are 76 years old or older, or if you are at risk for osteoporosis and fractures, ask your health care provider if you should: Be screened for bone loss. Take a calcium or vitamin D supplement to lower your risk of fractures. Be given hormone replacement therapy (HRT) to treat symptoms of menopause. Follow these instructions at home: Alcohol use Do not drink alcohol if: Your health care provider tells you not to drink. You are pregnant, may be pregnant, or are planning to become pregnant. If you drink alcohol: Limit how much you have to: 0-1 drink a day. Know how much alcohol is in your drink. In the [place] one drink equals one 12 oz bottle of beer (355 mL), one 5 oz glass of wine (148 mL), or one 1 oz glass of hard liquor (44 mL). Lifestyle Do not use any products that contain nicotine or tobacco. These products include cigarettes, chewing tobacco, and vaping devices, such as e-cigarettes. If you need help quitting, ask your health care provider. Do not use street drugs. Do not share needles. Ask your health care provider for help if you need support or information about quitting drugs. General instructions Schedule regular health, dental,  and eye exams. Stay current with your vaccines. Tell your health care provider if: You often feel depressed. You have ever been abused or do not feel safe at home. Summary Adopting a healthy lifestyle and getting preventive care are important in promoting health and wellness. Follow your health care provider's instructions about healthy diet, exercising, and getting tested or screened for diseases. Follow your health care provider's  instructions on monitoring your cholesterol and blood pressure. This information is not intended to replace advice given to you by your health care provider. Make sure you discuss any questions you have with your health care provider. Document Revised: 12/10/2020 Document Reviewed: 12/10/2020 Elsevier Patient Education  Hardy, MD [facility]

## 2022-10-22 NOTE — Assessment & Plan Note (Signed)
Stable.  Advised to stay well-hydrated and avoid NSAIDs. 

## 2022-10-22 NOTE — Assessment & Plan Note (Signed)
Eating better and losing weight Diet and nutrition discussed Continues Wegovy 2.4 mg weekly Will soon no longer be covered by insurance Wt Readings from Last 3 Encounters:  10/22/22 177 lb 6 oz (80.5 kg)  03/26/22 200 lb 6 oz (90.9 kg)  11/18/21 219 lb 6 oz (99.5 kg)

## 2022-10-22 NOTE — Assessment & Plan Note (Signed)
Not present on physical examination TSH done today Clinically euthyroid

## 2022-10-22 NOTE — Assessment & Plan Note (Signed)
Stable.  Not on CPAP treatment for the last year

## 2022-10-22 NOTE — Assessment & Plan Note (Signed)
Stable and asymptomatic at present time Only takes Protonix 40 mg as needed

## 2022-10-22 NOTE — Assessment & Plan Note (Signed)
Fatigue differential diagnosis discussed Needs diagnostic blood work Most likely secondary to recent COVID infection Postviral syndrome

## 2022-10-23 ENCOUNTER — Ambulatory Visit (INDEPENDENT_AMBULATORY_CARE_PROVIDER_SITE_OTHER)
Admission: RE | Admit: 2022-10-23 | Discharge: 2022-10-23 | Disposition: A | Discharge status: Home / Self Care | Payer: Commercial Managed Care - PPO | Source: Ambulatory Visit

## 2022-10-23 DIAGNOSIS — Z13820 Encounter for screening for osteoporosis: Secondary | ICD-10-CM | POA: Diagnosis not present

## 2022-11-20 ENCOUNTER — Other Ambulatory Visit: Payer: Self-pay | Admitting: Emergency Medicine

## 2022-11-20 DIAGNOSIS — Z8719 Personal history of other diseases of the digestive system: Secondary | ICD-10-CM

## 2022-11-21 ENCOUNTER — Other Ambulatory Visit: Payer: Self-pay | Admitting: *Deleted

## 2022-11-21 ENCOUNTER — Other Ambulatory Visit (HOSPITAL_COMMUNITY): Payer: Self-pay

## 2022-11-21 MED ORDER — BUTALBITAL-APAP-CAFFEINE 50-325-40 MG PO TABS: 1.0000 | ORAL_TABLET | Freq: Four times a day (QID) | ORAL | 1 refills | Status: DC | PRN

## 2022-11-21 MED ORDER — PANTOPRAZOLE SODIUM 40 MG PO TBEC
40.0000 mg | DELAYED_RELEASE_TABLET | Freq: Every day | ORAL | 0 refills | Status: DC
  Filled 2022-11-21: qty 90, 90d supply, fill #0

## 2022-11-23 NOTE — Telephone Encounter (Signed)
Duplicate

## 2023-01-21 ENCOUNTER — Other Ambulatory Visit: Payer: Self-pay

## 2023-01-21 ENCOUNTER — Other Ambulatory Visit (HOSPITAL_COMMUNITY): Payer: Self-pay

## 2023-01-22 ENCOUNTER — Other Ambulatory Visit: Payer: Self-pay | Admitting: Emergency Medicine

## 2023-01-22 ENCOUNTER — Other Ambulatory Visit (HOSPITAL_COMMUNITY): Payer: Self-pay

## 2023-01-22 DIAGNOSIS — M792 Neuralgia and neuritis, unspecified: Secondary | ICD-10-CM

## 2023-01-22 DIAGNOSIS — M797 Fibromyalgia: Secondary | ICD-10-CM

## 2023-01-22 MED ORDER — GABAPENTIN 300 MG PO CAPS
300.0000 mg | ORAL_CAPSULE | Freq: Four times a day (QID) | ORAL | 1 refills | Status: DC
Start: 2023-01-22 — End: 2023-07-01
  Filled 2023-01-22: qty 540, 68d supply, fill #0
  Filled 2023-04-27: qty 540, 68d supply, fill #1

## 2023-01-23 ENCOUNTER — Other Ambulatory Visit (HOSPITAL_COMMUNITY): Payer: Self-pay

## 2023-02-18 ENCOUNTER — Other Ambulatory Visit: Payer: Self-pay | Admitting: Emergency Medicine

## 2023-02-18 DIAGNOSIS — G43909 Migraine, unspecified, not intractable, without status migrainosus: Secondary | ICD-10-CM

## 2023-02-18 DIAGNOSIS — J309 Allergic rhinitis, unspecified: Secondary | ICD-10-CM

## 2023-02-18 DIAGNOSIS — Z8719 Personal history of other diseases of the digestive system: Secondary | ICD-10-CM

## 2023-02-18 DIAGNOSIS — R110 Nausea: Secondary | ICD-10-CM

## 2023-02-18 DIAGNOSIS — M62838 Other muscle spasm: Secondary | ICD-10-CM

## 2023-02-18 IMAGING — MG MM DIGITAL SCREENING BILAT W/ TOMO AND CAD
6 of 12 series · 6 of 36 positions shown · non-contrast
Comparison: Previous exam(s).

CLINICAL DATA: Screening.

EXAM:
DIGITAL SCREENING BILATERAL MAMMOGRAM WITH TOMOSYNTHESIS AND CAD
TECHNIQUE: Bilateral screening digital craniocaudal and mediolateral oblique
mammograms were obtained. Bilateral screening digital breast
tomosynthesis was performed. The images were evaluated with
computer-aided detection.

[L MLO synth-2D]
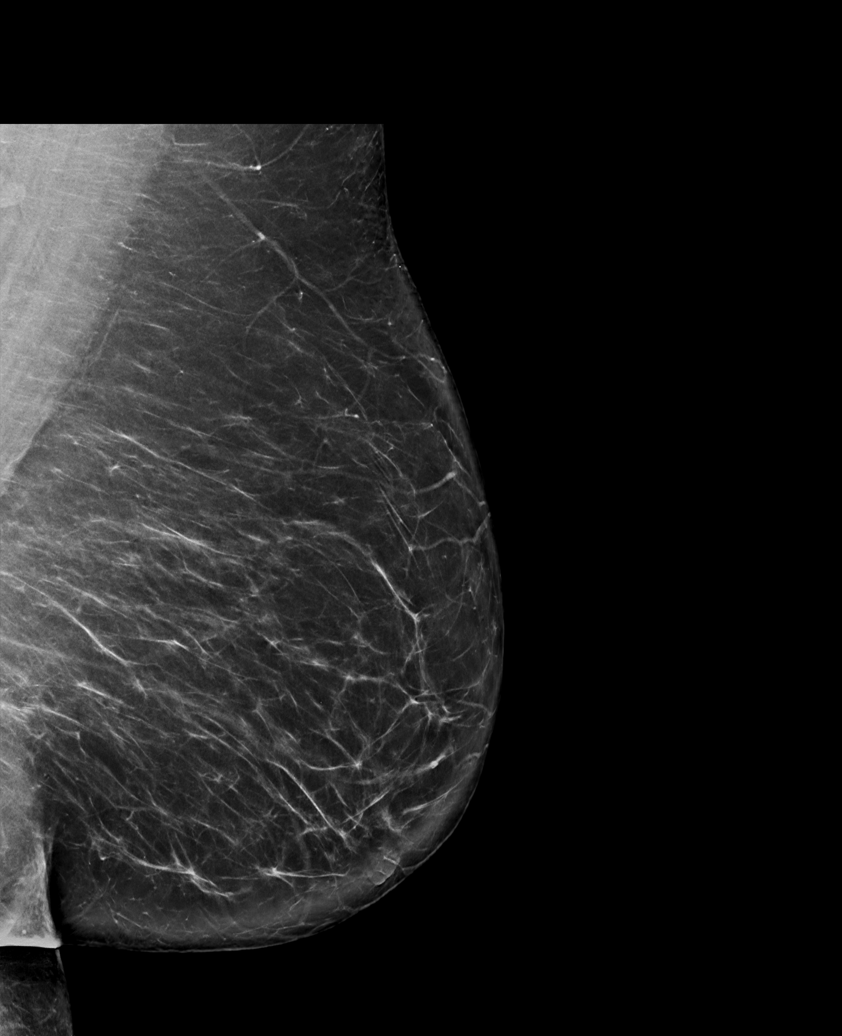

[L CC synth-2D (1 of 2)]
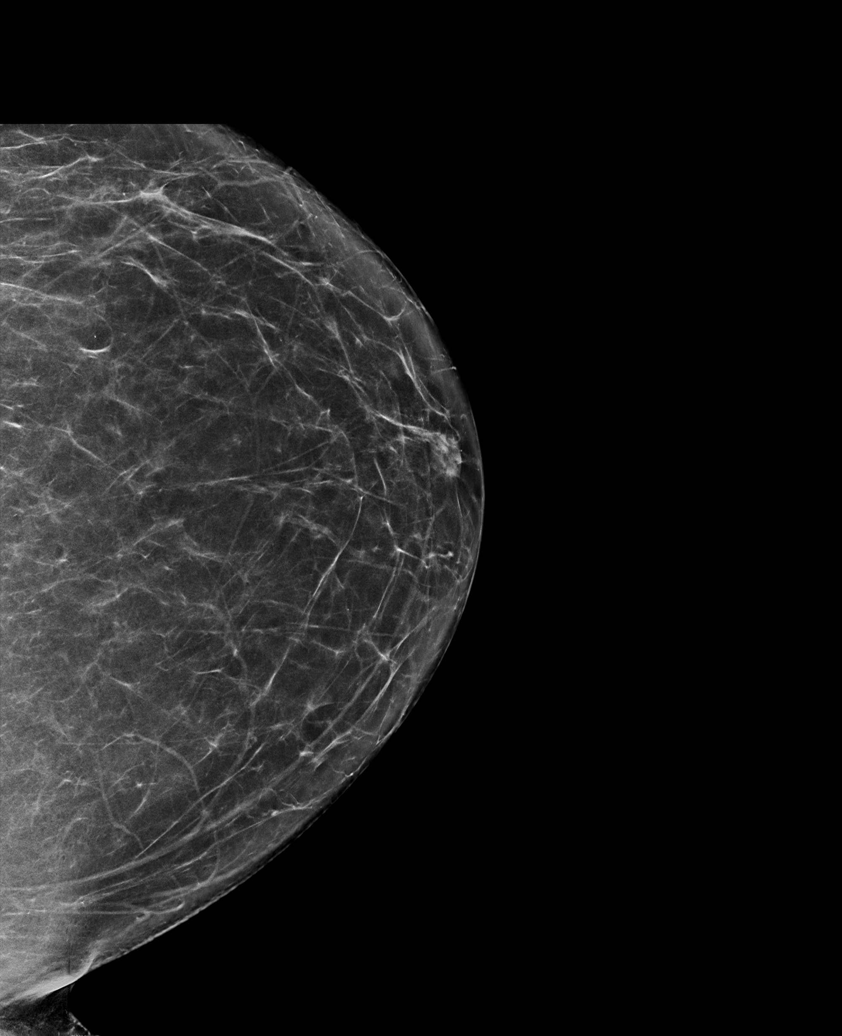

[R MLO synth-2D]
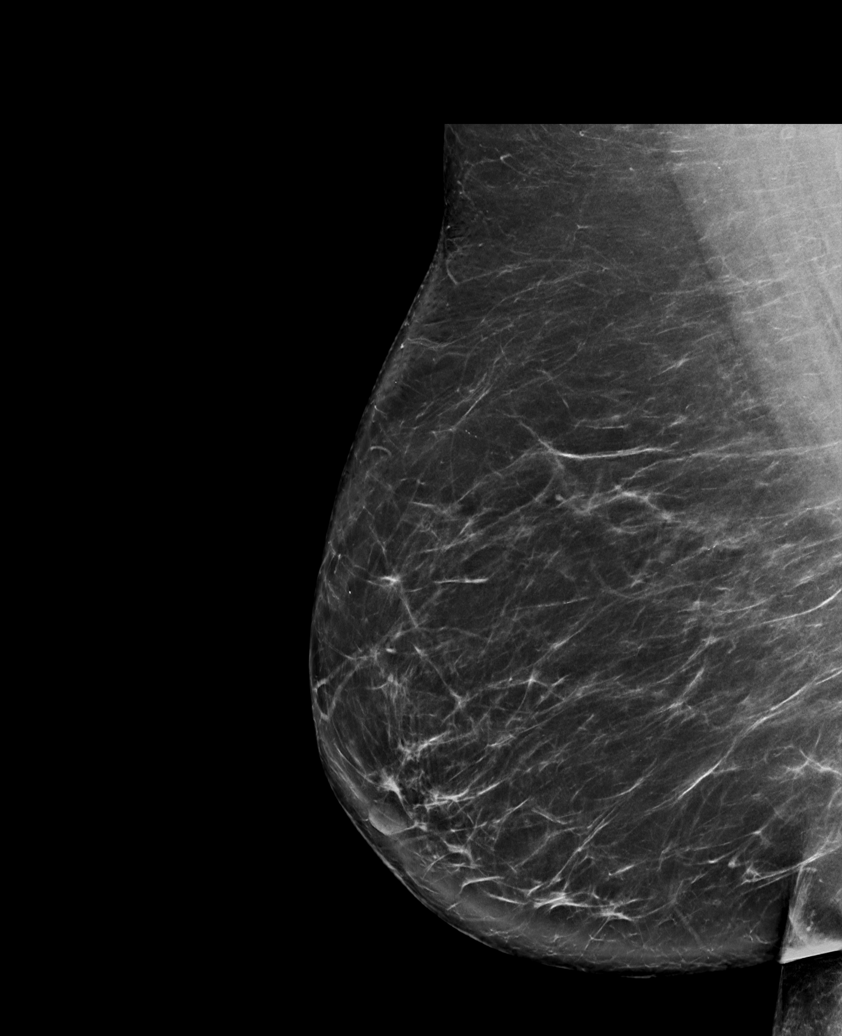

[R CC synth-2D (1 of 2)]
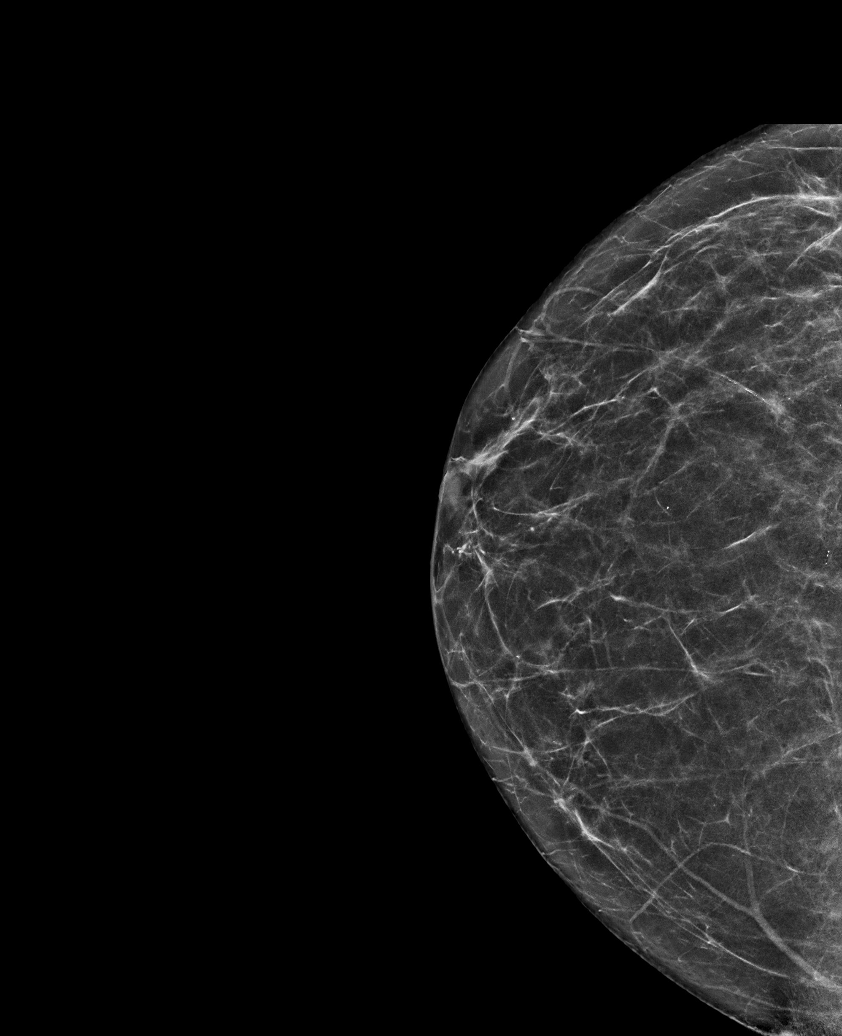

[L CC synth-2D (2 of 2)]
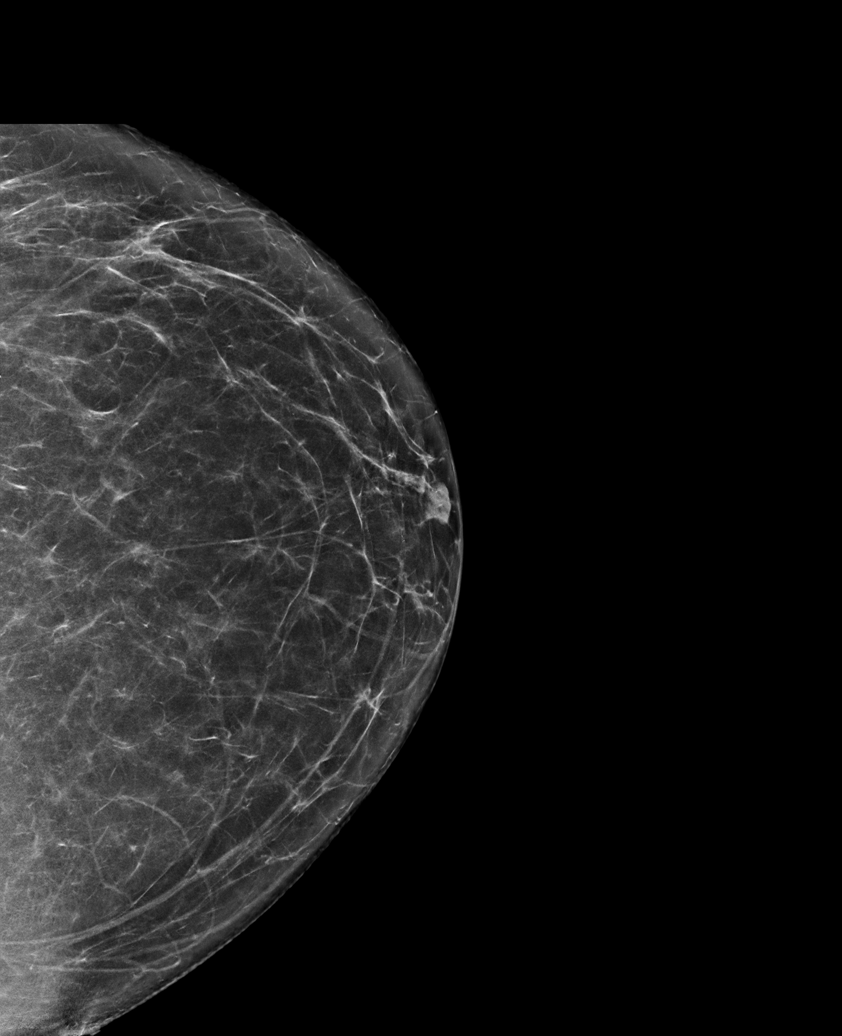

[R CC synth-2D (2 of 2)]
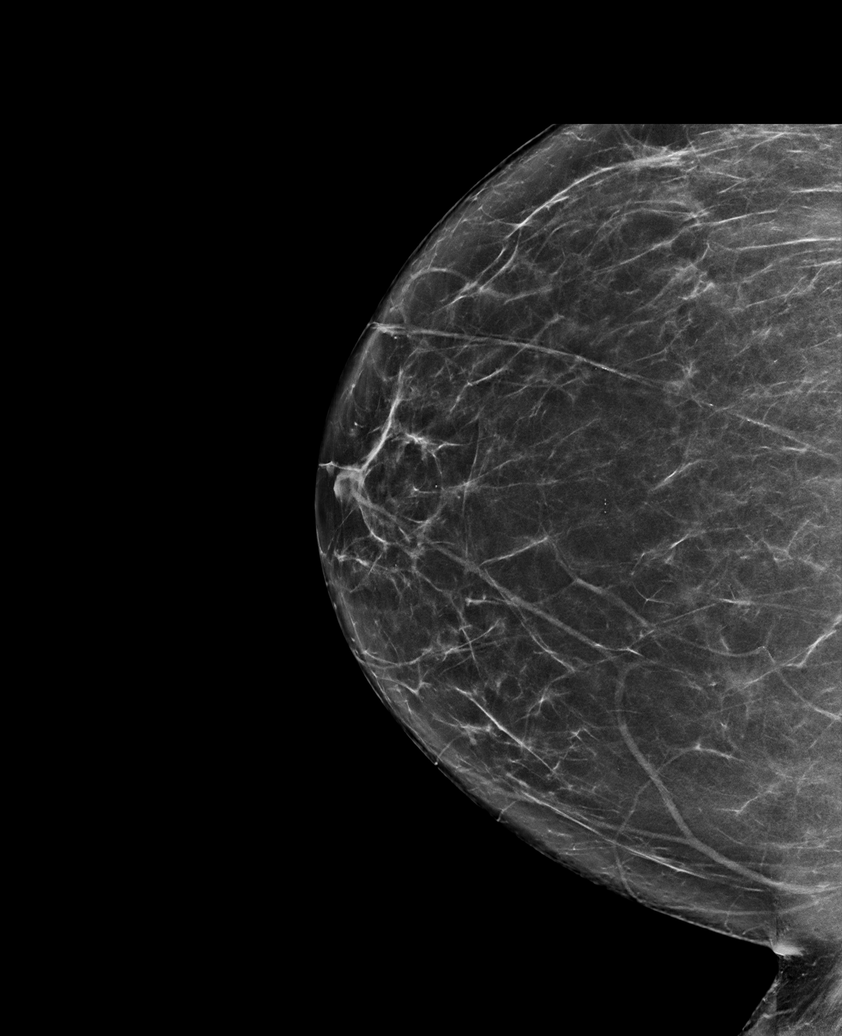

[6 of 36 positions shown; findings below may reference images not displayed]

ACR Breast Density Category b: There are scattered areas of
fibroglandular density.
FINDINGS: There are no findings suspicious for malignancy.
IMPRESSION: No mammographic evidence of malignancy. A result letter of this
screening mammogram will be mailed directly to the patient.

RECOMMENDATION:
Screening mammogram in one year. (Code:51-O-LD2)

BI-RADS CATEGORY  1: Negative.

## 2023-02-19 ENCOUNTER — Other Ambulatory Visit (HOSPITAL_COMMUNITY): Payer: Self-pay

## 2023-02-19 MED ORDER — PANTOPRAZOLE SODIUM 40 MG PO TBEC
40.0000 mg | DELAYED_RELEASE_TABLET | Freq: Every day | ORAL | 0 refills | Status: DC
Start: 2023-02-19 — End: 2023-06-01
  Filled 2023-02-19: qty 90, 90d supply, fill #0

## 2023-02-19 MED ORDER — TOPIRAMATE 50 MG PO TABS
50.0000 mg | ORAL_TABLET | Freq: Two times a day (BID) | ORAL | 1 refills | Status: DC
Start: 2023-02-19 — End: 2023-10-05
  Filled 2023-02-19: qty 180, 90d supply, fill #0
  Filled 2023-06-01: qty 180, 90d supply, fill #1

## 2023-02-19 MED ORDER — ONDANSETRON 4 MG PO TBDP
4.0000 mg | ORAL_TABLET | Freq: Three times a day (TID) | ORAL | 4 refills | Status: DC | PRN
Start: 2023-02-19 — End: 2023-08-10
  Filled 2023-02-19: qty 20, 7d supply, fill #0
  Filled 2023-03-24: qty 20, 7d supply, fill #1
  Filled 2023-04-27: qty 20, 7d supply, fill #2
  Filled 2023-06-01: qty 20, 7d supply, fill #3
  Filled 2023-07-01: qty 20, 7d supply, fill #4

## 2023-02-19 MED ORDER — FLUTICASONE PROPIONATE 50 MCG/ACT NA SUSP
1.0000 | Freq: Every day | NASAL | 6 refills | Status: DC
Start: 2023-02-19 — End: 2024-04-06
  Filled 2023-02-19: qty 16, 60d supply, fill #0
  Filled 2023-04-27: qty 16, 60d supply, fill #1
  Filled 2023-07-01: qty 16, 60d supply, fill #2
  Filled 2023-10-05: qty 16, 60d supply, fill #3
  Filled 2024-01-04: qty 16, 60d supply, fill #4

## 2023-02-19 MED ORDER — CYCLOBENZAPRINE HCL 10 MG PO TABS
10.0000 mg | ORAL_TABLET | Freq: Three times a day (TID) | ORAL | 1 refills | Status: DC | PRN
Start: 2023-02-19 — End: 2023-09-02
  Filled 2023-02-19: qty 180, 60d supply, fill #0
  Filled 2023-06-01: qty 180, 60d supply, fill #1

## 2023-02-20 ENCOUNTER — Telehealth: Payer: Self-pay | Admitting: Emergency Medicine

## 2023-02-20 ENCOUNTER — Other Ambulatory Visit (HOSPITAL_COMMUNITY): Payer: Self-pay

## 2023-02-20 MED ORDER — BUTALBITAL-APAP-CAFFEINE 50-325-40 MG PO TABS
1.0000 | ORAL_TABLET | Freq: Four times a day (QID) | ORAL | 0 refills | Status: AC | PRN
  Filled 2023-02-20: qty 20, 5d supply, fill #0

## 2023-02-20 NOTE — Telephone Encounter (Signed)
Notified pt refill has been sent to [facility] pharmacy.Melissa KitchenRaechel Chute

## 2023-02-20 NOTE — Telephone Encounter (Signed)
MD is not in the office today pls advise on refill.Marland KitchenRaechel [name]

## 2023-02-20 NOTE — Telephone Encounter (Signed)
Done erx 

## 2023-02-20 NOTE — Telephone Encounter (Signed)
Caller & Relationship to patient: Vikkie - patient  Call back number: [phone]  Date of last office visit: 10-22-22  Date of next office visit: 10-26-23  Medication(s) to be refilled: butalbital-acetaminophen-caffeine (FIORICET) 50-325-40 MG tablet   Preferred Pharmacy: [facility] Phone: [phone]  Fax: [phone]      Patient has had migraine for a couple of days and needs this refilled as soon as possible.

## 2023-03-24 ENCOUNTER — Other Ambulatory Visit: Payer: Self-pay | Admitting: Emergency Medicine

## 2023-03-24 DIAGNOSIS — M797 Fibromyalgia: Secondary | ICD-10-CM

## 2023-03-24 DIAGNOSIS — F32A Depression, unspecified: Secondary | ICD-10-CM

## 2023-03-24 DIAGNOSIS — F411 Generalized anxiety disorder: Secondary | ICD-10-CM

## 2023-03-25 ENCOUNTER — Other Ambulatory Visit (HOSPITAL_COMMUNITY): Payer: Self-pay

## 2023-03-25 ENCOUNTER — Other Ambulatory Visit: Payer: Self-pay

## 2023-03-25 MED ORDER — BUSPIRONE HCL 7.5 MG PO TABS
7.5000 mg | ORAL_TABLET | Freq: Two times a day (BID) | ORAL | 1 refills | Status: DC
Start: 2023-03-25 — End: 2023-10-05
  Filled 2023-03-25: qty 180, 90d supply, fill #0
  Filled 2023-07-01: qty 180, 90d supply, fill #1

## 2023-03-25 MED ORDER — DULOXETINE HCL 60 MG PO CPEP
60.0000 mg | ORAL_CAPSULE | Freq: Every day | ORAL | 1 refills | Status: DC
Start: 2023-03-25 — End: 2023-10-05
  Filled 2023-03-25: qty 90, 90d supply, fill #0
  Filled 2023-07-01: qty 90, 90d supply, fill #1

## 2023-03-30 ENCOUNTER — Encounter: Payer: Self-pay | Admitting: Oncology

## 2023-03-31 ENCOUNTER — Ambulatory Visit (INDEPENDENT_AMBULATORY_CARE_PROVIDER_SITE_OTHER): Payer: Commercial Managed Care - PPO

## 2023-03-31 DIAGNOSIS — Z1239 Encounter for other screening for malignant neoplasm of breast: Secondary | ICD-10-CM | POA: Diagnosis not present

## 2023-03-31 DIAGNOSIS — Z0000 Encounter for general adult medical examination without abnormal findings: Secondary | ICD-10-CM | POA: Diagnosis not present

## 2023-03-31 DIAGNOSIS — Z13820 Encounter for screening for osteoporosis: Secondary | ICD-10-CM

## 2023-03-31 NOTE — Progress Notes (Addendum)
Subjective:   [name] is a 67 y.o. female who presents for Medicare Annual (Subsequent) preventive examination.  Visit Complete: Virtual  I connected with  [name] on 03/31/23 by a audio enabled telemedicine application and verified that I am speaking with the correct person using two identifiers.  Patient Location: Home  Provider Location: Home Office  I discussed the limitations of evaluation and management by telemedicine. The patient expressed understanding and agreed to proceed.  Vital Signs: Because this visit was a virtual/telehealth visit, some criteria may be missing or patient reported. Any vitals not documented were not able to be obtained and vitals that have been documented are patient reported.    Review of Systems     Cardiac Risk Factors include: advanced age (>38men, >72 women);dyslipidemia;family history of premature cardiovascular disease;hypertension;sedentary lifestyle;obesity (BMI >30kg/m2)     Objective:    Today's Vitals   03/31/23 1104  Weight: 179 lb (81.2 kg)  Height: 5\' 4"  (1.626 m)  PainSc: 0-No pain   Body mass index is 30.73 kg/m.     03/31/2023   11:06 AM 12/06/2019    1:10 PM 07/25/2019    1:20 PM 07/08/2017   12:21 PM 01/09/2017    8:13 AM 08/29/2016    9:21 AM  Advanced Directives  Does Patient Have a Medical Advance Directive? No No No No No No  Would patient like information on creating a medical advance directive? No - Patient declined Yes (ED - Information included in AVS) No - Patient declined No - Patient declined      Current Medications (verified) Outpatient Encounter Medications as of 03/31/2023  Medication Sig   albuterol (PROAIR HFA) 108 (90 Base) MCG/ACT inhaler Inhale 1-2 puffs into the lungs every 4 (four) hours as needed for wheezing or shortness of breath.   atorvastatin (LIPITOR) 20 MG tablet Take 1 tablet (20 mg total) by mouth daily.   busPIRone (BUSPAR) 7.5 MG tablet Take 1 tablet (7.5 mg total) by  mouth 2 (two) times daily.   butalbital-acetaminophen-caffeine (FIORICET) 50-325-40 MG tablet Take 1 tablet by mouth every 6 (six) hours as needed for headache.   cyclobenzaprine (FLEXERIL) 10 MG tablet Take 1 tablet (10 mg total) by mouth 3 (three) times daily as needed for muscle spasms.   DULoxetine (CYMBALTA) 60 MG capsule Take 1 capsule (60 mg total) by mouth daily.   fluticasone (FLONASE) 50 MCG/ACT nasal spray Place 1 spray into both nostrils daily.   furosemide (LASIX) 20 MG tablet Take 1 tablet (20 mg total) by mouth 2 (two) times daily as needed. (Patient not taking: Reported on 10/22/2022)   gabapentin (NEURONTIN) 300 MG capsule Take 1-2 capsules (300-600 mg total) by mouth 4 (four) times daily.   lisinopril (ZESTRIL) 20 MG tablet Take 1 tablet (20 mg total) by mouth daily.   ondansetron (ZOFRAN-ODT) 4 MG disintegrating tablet Dissolve 1 tablet (4 mg total) by mouth every 8 (eight) hours as needed for nausea or vomiting.   pantoprazole (PROTONIX) 40 MG tablet Take 1 tablet (40 mg total) by mouth daily.   propranolol (INDERAL) 60 MG tablet Take 1 tablet (60 mg total) by mouth 3 (three) times daily.   Semaglutide-Weight Management (WEGOVY) 2.4 MG/0.75ML SOAJ Inject 2.4 mg into the skin once a week.   topiramate (TOPAMAX) 50 MG tablet Take 1 tablet (50 mg total) by mouth 2 (two) times daily.   vortioxetine HBr (TRINTELLIX) 10 MG TABS tablet Take 1 tablet (10 mg total) by mouth  daily.   No facility-administered encounter medications on file as of 03/31/2023.    Allergies (verified) Sulfa antibiotics   History: Past Medical History:  Diagnosis Date   Allergy    [name], Flonase   Anemia    Anxiety    Arthritis    Chronic kidney disease    Colon polyps    Depression    Fibromyalgia    Gastritis    GERD (gastroesophageal reflux disease)    Hernia, hiatal    High risk for colon cancer    HLD (hyperlipidemia)    Hypertension    Migraine    Neuropathy    Osteoporosis     Pneumonia    Sleep apnea    c-pap nightly   Sleep apnea with use of continuous positive airway pressure (CPAP)    Thyroid goiter    Past Surgical History:  Procedure Laterality Date   48-Hour Holter Monitor  06/2017   Mostly normal sinus rhythm.  Average heart rate 71 bpm.  Minimum 54 bpm.  Maximum sinus rate 114 bpm.  Rare PACs and PVCs.  No A. fib, atrial flutter, SVT or VT.  One brief run of 8 beat PAT.   ABDOMINAL HYSTERECTOMY     ovaries intact; DUB.  No dysplasia.   FRACTURE SURGERY N/A    [employer] 09/25/2020   JOINT REPLACEMENT     OPEN REDUCTION INTERNAL FIXATION (ORIF) DISTAL RADIAL FRACTURE Right 07/09/2017   Procedure: RIGHT WRIST OPEN REDUCTION INTERNAL FIXATION (ORIF) DISTAL RADIAL FRACTURE AND REPAIR AS INDICATED;  Surgeon: Bradly Bienenstock, MD;  Location: [facility] OR;  Service: Orthopedics;  Laterality: Right;   PARTIAL HYSTERECTOMY     ROTATOR CUFF REPAIR Right 2009   SHOULDER SURGERY Right    shoulder dislocation, fall, and elbow unla nerve   SHOULDER SURGERY Right 2015   labral tear   TRANSTHORACIC ECHOCARDIOGRAM  06/2017    EF 55-60%.  grade 1 diastolic dysfunction.  Otherwise normal   TUBAL LIGATION     ULNAR NERVE REPAIR Right 08/19/2014   Family History  Problem Relation Age of Onset   Heart disease Mother 18       AMI/CAD/CHF as cause of death   Hypertension Mother    Hyperlipidemia Mother    Hypothyroidism Mother    Depression Mother    Gout Mother    Heart disease Father 66       AMI   Hypertension Father    Stroke Father 52       mild CVA   Prostate cancer Father    Hyperlipidemia Father    Cancer Father 7       prostate cancer   Hypertension Brother    Hyperlipidemia Brother    Cancer Brother        prostate cancer   Cancer Maternal Grandmother        type unknown   Cancer Maternal Grandfather        type unknown   Heart disease Paternal Grandmother    Heart defect Paternal Grandfather    Melanoma Brother    Hyperlipidemia Brother     Hypertension Brother    Cancer Brother        melanoma   Hypertension Brother    Hyperlipidemia Brother    Melanoma Brother    Cancer Brother 25       melanoma   Cancer Sister        Basal cell carcinoma scalp   Depression Sister    Hypertension Brother  Depression Brother    Hypertension Brother    Gout Brother    Arthritis Sister    Depression Sister    Colon cancer Neg Hx    Esophageal cancer Neg Hx    Stomach cancer Neg Hx    Rectal cancer Neg Hx    Social History   Socioeconomic History   Marital status: Married    Spouse name: Not on file   Number of children: 3   Years of education: Not on file   Highest education level: Not on file  Occupational History   Occupation: unemployed  Tobacco Use   Smoking status: Former    Current packs/day: 0.00    Types: Cigarettes    Quit date: 08/04/1998    Years since quitting: 24.6   Smokeless tobacco: Never  Vaping Use   Vaping status: Never Used  Substance and Sexual Activity   Alcohol use: No   Drug use: No   Sexual activity: Yes    Birth control/protection: Surgical, Post-menopausal  Other Topics Concern   Not on file  Social History Narrative   Marital status: married x 20 years; moderately happy     Children: 3 children (47 Dimas Aguas, 34 Dianna, 40 April); 8 grandchildren; 0 gg     Lives: with husband/Steve, April, 2 granddaughters     Employment:  Unemployed; quit working 2015 with work related injury; awaiting disability in 2018; disability approved in 02/2017.       Tobacco: quit smoking in 2016; smoked x 2 years.      Alcohol: none      Drugs: none      Exercise: rarely in 2018         Social Determinants of Health   Financial Resource Strain: Low Risk  (03/31/2023)   Overall Financial Resource Strain (CARDIA)    Difficulty of Paying Living Expenses: Not hard at all  Food Insecurity: No Food Insecurity (03/31/2023)   Hunger Vital Sign    Worried About Running Out of Food in the Last Year: Never true     Ran Out of Food in the Last Year: Never true  Transportation Needs: No Transportation Needs (03/31/2023)   PRAPARE - Administrator, Civil Service (Medical): No    Lack of Transportation (Non-Medical): No  Physical Activity: Inactive (03/31/2023)   Exercise Vital Sign    Days of Exercise per Week: 0 days    Minutes of Exercise per Session: 0 min  Stress: No Stress Concern Present (03/31/2023)   [employer] of Occupational Health - Occupational Stress Questionnaire    Feeling of Stress : Not at all  Social Connections: Unknown (03/31/2023)   Social Connection and Isolation Panel [NHANES]    Frequency of Communication with Friends and Family: More than three times a week    Frequency of Social Gatherings with Friends and Family: More than three times a week    Attends Religious Services: Not on Marketing executive or Organizations: Yes    Attends Banker Meetings: Not on file    Marital Status: Married    Tobacco Counseling Counseling given: Not Answered   Clinical Intake:  Pre-visit preparation completed: Yes  Pain : No/denies pain Pain Score: 0-No pain     BMI - recorded: 30.73 Nutritional Status: BMI > 30  Obese Nutritional Risks: None Diabetes: No  How often do you need to have someone help you when you read instructions, pamphlets, or other written materials  from your doctor or pharmacy?: 1 - Never What is the last grade level you completed in school?: GED  Interpreter Needed?: No  Information entered by :: Kalifa Cadden N. Lige Lakeman, LPN.   Activities of Daily Living    03/31/2023   11:08 AM  In your present state of health, do you have any difficulty performing the following activities:  Hearing? 1  Vision? 0  Difficulty concentrating or making decisions? 1  Comment remembering issues  Walking or climbing stairs? 0  Dressing or bathing? 0  Doing errands, shopping? 0  Preparing Food and eating ? N  Using the Toilet? N  In  the past six months, have you accidently leaked urine? Y  Do you have problems with loss of bowel control? N  Managing your Medications? N  Managing your Finances? N  Housekeeping or managing your Housekeeping? N    Patient Care Team: Georgina Quint, MD as PCP - General (Internal Medicine) Marykay Lex, MD as PCP - Cardiology (Cardiology) Magrinat, Valentino Hue, MD (Inactive) as Consulting Physician (Hematology and Oncology) Josephine Igo, DO as Consulting Physician (Pulmonary Disease) Rachael Fee, MD as Attending Physician (Gastroenterology)  Indicate any recent Medical Services you may have received from other than [facility] providers in the past year (date may be approximate).     Assessment:   This is a routine wellness examination for Jaxyn.  Hearing/Vision screen Hearing Screening - Comments:: Patient has hearing difficulty and wears hearing aids. Vision Screening - Comments:: Patient does wear corrective lenses/contacts.  Annual eye exam done by: [facility]  Dietary issues and exercise activities discussed:     Goals Addressed             This Visit's Progress    Client understands the importance of follow-up with providers by attending scheduled visits        Depression Screen    03/31/2023   11:17 AM 10/22/2022    2:05 PM 03/26/2022   11:05 AM 11/18/2021    1:20 PM 07/01/2021    1:11 PM 09/26/2020    1:27 PM 07/04/2020    9:59 AM  PHQ 2/9 Scores  PHQ - 2 Score 0 4 1 2 2  0 0  PHQ- 9 Score 0 10  8       Fall Risk    03/31/2023   11:07 AM 10/22/2022    2:05 PM 03/26/2022   11:04 AM 11/18/2021    1:20 PM 07/01/2021    1:11 PM  Fall Risk   Falls in the past year? 0 0 0 1 0  Number falls in past yr: 0 0 0 0 0  Injury with Fall? 0 0 0 0 0  Risk for fall due to : No Fall Risks No Fall Risks No Fall Risks    Follow up Falls prevention discussed Falls evaluation completed Falls evaluation completed      MEDICARE RISK AT HOME: Medicare  Risk at Home Any stairs in or around the home?: Yes (basement) If so, are there any without handrails?: No Home free of loose throw rugs in walkways, pet beds, electrical cords, etc?: Yes Adequate lighting in your home to reduce risk of falls?: Yes Life alert?: No Use of a cane, walker or w/c?: No Grab bars in the bathroom?: No Shower chair or bench in shower?: No Elevated toilet seat or a handicapped toilet?: No  TIMED UP AND GO:  Was the test performed?  No    Cognitive  Function:        03/31/2023   11:11 AM 12/06/2019    1:10 PM  6CIT Screen  What Year? 0 points 0 points  What month? 0 points 0 points  What time? 0 points 0 points  Count back from 20 0 points 0 points  Months in reverse 0 points 0 points  Repeat phrase 0 points 0 points  Total Score 0 points 0 points    Immunizations Immunization History  Administered Date(s) Administered   Fluad Quad(high Dose 65+) 05/30/2021, 09/17/2022   Influenza,inj,Quad PF,6+ Mos 05/28/2015, 05/21/2016, 04/21/2017, 04/27/2018, 05/11/2019, 04/27/2020   PFIZER(Purple Top)SARS-COV-2 Vaccination 10/20/2019, 11/29/2019, 05/21/2020   PNEUMOCOCCAL CONJUGATE-20 07/01/2021   Pfizer Covid-19 Vaccine Bivalent Booster 22yrs & up 05/30/2021   Tdap 06/25/2015   Zoster Recombinant(Shingrix) 02/09/2017, 04/21/2017    TDAP status: Up to date  Flu Vaccine status: Up to date  Pneumococcal vaccine status: Up to date  Covid-19 vaccine status: Completed vaccines  Qualifies for Shingles Vaccine? Yes   Zostavax completed No   Shingrix Completed?: Yes  Screening Tests Health Maintenance  Topic Date Due   COVID-19 Vaccine (5 - 2023-24 season) 04/04/2022   MAMMOGRAM  03/27/2023   INFLUENZA VACCINE  03/05/2023   Colonoscopy  01/05/2024   Medicare Annual Wellness (AWV)  03/30/2024   DTaP/Tdap/Td (2 - Td or Tdap) 06/24/2025   Pneumonia Vaccine 70+ Years old  Completed   DEXA SCAN  Completed   Hepatitis C Screening  Completed   Zoster  Vaccines- Shingrix  Completed   HPV VACCINES  Aged Out    Health Maintenance  Health Maintenance Due  Topic Date Due   COVID-19 Vaccine (5 - 2023-24 season) 04/04/2022   MAMMOGRAM  03/27/2023   INFLUENZA VACCINE  03/05/2023    Colorectal cancer screening: Type of screening: Colonoscopy. Completed 01/04/2021. Repeat every 3 years  Mammogram status: Ordered 03/31/2023. Pt provided with contact info and advised to call to schedule appt.   Bone Density status: Completed 10/23/2022. Results reflect: Bone density results: OSTEOPENIA. Repeat every 2-3 years.  Lung Cancer Screening: (Low Dose CT Chest recommended if Age 2-80 years, 20 pack-year currently smoking OR have quit w/in 15years.) does not qualify.   Lung Cancer Screening Referral: NO  Additional Screening:  Hepatitis C Screening: does qualify; Completed 07/18/2021  Vision Screening: Recommended annual ophthalmology exams for early detection of glaucoma and other disorders of the eye. Is the patient up to date with their annual eye exam?  Yes  Who is the provider or what is the name of the office in which the patient attends annual eye exams? [facility] If pt is not established with a provider, would they like to be referred to a provider to establish care? No .   Dental Screening: Recommended annual dental exams for proper oral hygiene  Diabetic Foot Exam: N/A  Community Resource Referral / Chronic Care Management: CRR required this visit?  No   CCM required this visit?  No     Plan:     I have personally reviewed and noted the following in the patient's chart:   Medical and social history Use of alcohol, tobacco or illicit drugs  Current medications and supplements including opioid prescriptions. Patient is not currently taking opioid prescriptions. Functional ability and status Nutritional status Physical activity Advanced directives List of other physicians Hospitalizations, surgeries, and ER  visits in previous 12 months Vitals Screenings to include cognitive, depression, and falls Referrals and appointments  In addition, I  have reviewed and discussed with patient certain preventive protocols, quality metrics, and best practice recommendations. A written personalized care plan for preventive services as well as general preventive health recommendations were provided to patient.     Mickeal Needy, LPN   1/61/0960   After Visit Summary: (Mail) Due to this being a telephonic visit, the after visit summary with patients personalized plan was offered to patient via mail   Nurse Notes: Normal cognitive status assessed by direct observation via telephone conversation by this Nurse Health Advisor. No abnormalities found.

## 2023-03-31 NOTE — Patient Instructions (Signed)
[name] , Thank you for taking time to come for your Medicare Wellness Visit. I appreciate your ongoing commitment to your health goals. Please review the following plan we discussed and let me know if I can assist you in the future.   Referrals/Orders/Follow-Ups/Clinician Recommendations: Yes; Screening Mammogram at [facility]  This is a list of the screening recommended for you and due dates:  Health Maintenance  Topic Date Due   COVID-19 Vaccine (5 - 2023-24 season) 04/04/2022   Mammogram  03/27/2023   Flu Shot  03/05/2023   Colon Cancer Screening  01/05/2024   Medicare Annual Wellness Visit  03/30/2024   DTaP/Tdap/Td vaccine (2 - Td or Tdap) 06/24/2025   Pneumonia Vaccine  Completed   DEXA scan (bone density measurement)  Completed   Hepatitis C Screening  Completed   Zoster (Shingles) Vaccine  Completed   HPV Vaccine  Aged Out    Advanced directives: (Declined) Advance directive discussed with you today. Even though you declined this today, please call our office should you change your mind, and we can give you the proper paperwork for you to fill out.  Next Medicare Annual Wellness Visit scheduled for next year: Yes  Preventive Care attachment

## 2023-04-10 ENCOUNTER — Ambulatory Visit
Admission: RE | Admit: 2023-04-10 | Discharge: 2023-04-10 | Disposition: A | Discharge status: Home / Self Care | Payer: Medicare PPO | Source: Ambulatory Visit | Attending: Emergency Medicine | Admitting: Emergency Medicine

## 2023-04-10 DIAGNOSIS — Z1239 Encounter for other screening for malignant neoplasm of breast: Secondary | ICD-10-CM

## 2023-04-10 DIAGNOSIS — Z1231 Encounter for screening mammogram for malignant neoplasm of breast: Secondary | ICD-10-CM | POA: Diagnosis not present

## 2023-04-27 ENCOUNTER — Other Ambulatory Visit: Payer: Self-pay | Admitting: Emergency Medicine

## 2023-04-27 DIAGNOSIS — F339 Major depressive disorder, recurrent, unspecified: Secondary | ICD-10-CM

## 2023-04-27 DIAGNOSIS — I10 Essential (primary) hypertension: Secondary | ICD-10-CM

## 2023-04-28 ENCOUNTER — Other Ambulatory Visit: Payer: Self-pay

## 2023-04-28 ENCOUNTER — Other Ambulatory Visit (HOSPITAL_COMMUNITY): Payer: Self-pay

## 2023-04-28 MED ORDER — LISINOPRIL 20 MG PO TABS
20.0000 mg | ORAL_TABLET | Freq: Every day | ORAL | 1 refills | Status: DC
  Filled 2023-04-28: qty 90, 90d supply, fill #0
  Filled 2023-08-10: qty 90, 90d supply, fill #1

## 2023-04-28 MED ORDER — VORTIOXETINE HBR 10 MG PO TABS
10.0000 mg | ORAL_TABLET | Freq: Every day | ORAL | 1 refills | Status: DC
  Filled 2023-04-28: qty 90, 90d supply, fill #0
  Filled 2023-08-10: qty 90, 90d supply, fill #1

## 2023-04-29 ENCOUNTER — Other Ambulatory Visit (HOSPITAL_COMMUNITY): Payer: Self-pay

## 2023-04-29 ENCOUNTER — Encounter: Payer: Self-pay | Admitting: Oncology

## 2023-04-29 MED ORDER — COVID-19 MRNA VAC-TRIS(PFIZER) 30 MCG/0.3ML IM SUSY
0.5000 mL | PREFILLED_SYRINGE | Freq: Once | INTRAMUSCULAR | 0 refills | Status: AC
  Filled 2023-04-29: qty 0.3, 1d supply, fill #0

## 2023-04-29 MED ORDER — SODIUM FLUORIDE 1.1 % DT PSTE
1.0000 | PASTE | Freq: Every evening | DENTAL | 3 refills | Status: DC
  Filled 2023-04-29: qty 100, 30d supply, fill #0

## 2023-04-29 MED ORDER — INFLUENZA VAC A&B SURF ANT ADJ 0.5 ML IM SUSY
0.5000 mL | PREFILLED_SYRINGE | Freq: Once | INTRAMUSCULAR | 0 refills | Status: AC
  Filled 2023-04-29: qty 0.5, 1d supply, fill #0

## 2023-04-29 MED ORDER — SODIUM FLUORIDE 1.1 % DT GEL
1.0000 | Freq: Every evening | DENTAL | 3 refills | Status: DC
  Filled 2023-04-29: qty 100, 30d supply, fill #0
  Filled 2023-04-29: qty 100, 100d supply, fill #0
  Filled 2023-07-01: qty 100, 30d supply, fill #1

## 2023-05-20 ENCOUNTER — Other Ambulatory Visit (HOSPITAL_COMMUNITY): Payer: Self-pay

## 2023-06-01 ENCOUNTER — Other Ambulatory Visit (HOSPITAL_COMMUNITY): Payer: Self-pay

## 2023-06-01 ENCOUNTER — Other Ambulatory Visit: Payer: Self-pay | Admitting: Emergency Medicine

## 2023-06-01 ENCOUNTER — Other Ambulatory Visit: Payer: Self-pay

## 2023-06-01 DIAGNOSIS — Z8719 Personal history of other diseases of the digestive system: Secondary | ICD-10-CM

## 2023-06-01 MED ORDER — PANTOPRAZOLE SODIUM 40 MG PO TBEC
40.0000 mg | DELAYED_RELEASE_TABLET | Freq: Every day | ORAL | 0 refills | Status: DC
  Filled 2023-06-01: qty 90, 90d supply, fill #0

## 2023-07-01 ENCOUNTER — Other Ambulatory Visit: Payer: Self-pay

## 2023-07-01 ENCOUNTER — Other Ambulatory Visit (HOSPITAL_COMMUNITY): Payer: Self-pay

## 2023-07-01 ENCOUNTER — Other Ambulatory Visit: Payer: Self-pay | Admitting: Emergency Medicine

## 2023-07-01 DIAGNOSIS — M797 Fibromyalgia: Secondary | ICD-10-CM

## 2023-07-01 DIAGNOSIS — M792 Neuralgia and neuritis, unspecified: Secondary | ICD-10-CM

## 2023-07-01 MED ORDER — GABAPENTIN 300 MG PO CAPS
300.0000 mg | ORAL_CAPSULE | Freq: Four times a day (QID) | ORAL | 1 refills | Status: DC
Start: 2023-07-01 — End: 2024-01-14
  Filled 2023-07-01: qty 540, 68d supply, fill #0
  Filled 2023-10-05: qty 540, 68d supply, fill #1

## 2023-08-10 ENCOUNTER — Other Ambulatory Visit: Payer: Self-pay | Admitting: Cardiology

## 2023-08-10 ENCOUNTER — Other Ambulatory Visit: Payer: Self-pay | Admitting: Emergency Medicine

## 2023-08-10 ENCOUNTER — Other Ambulatory Visit: Payer: Self-pay

## 2023-08-10 ENCOUNTER — Other Ambulatory Visit (HOSPITAL_COMMUNITY): Payer: Self-pay

## 2023-08-10 DIAGNOSIS — R110 Nausea: Secondary | ICD-10-CM

## 2023-08-10 MED ORDER — ONDANSETRON 4 MG PO TBDP
4.0000 mg | ORAL_TABLET | Freq: Three times a day (TID) | ORAL | 4 refills | Status: DC | PRN
Start: 2023-08-10 — End: 2024-01-04
  Filled 2023-08-10: qty 20, 7d supply, fill #0
  Filled 2023-09-02: qty 20, 7d supply, fill #1
  Filled 2023-10-05: qty 20, 7d supply, fill #2
  Filled 2023-11-12: qty 20, 7d supply, fill #3
  Filled 2023-12-01: qty 20, 7d supply, fill #4

## 2023-08-11 ENCOUNTER — Other Ambulatory Visit (HOSPITAL_COMMUNITY): Payer: Self-pay

## 2023-08-11 MED ORDER — PROPRANOLOL HCL 60 MG PO TABS
60.0000 mg | ORAL_TABLET | Freq: Three times a day (TID) | ORAL | 0 refills | Status: DC
Start: 2023-08-11 — End: 2023-10-16
  Filled 2023-08-11: qty 90, 30d supply, fill #0

## 2023-08-22 ENCOUNTER — Telehealth: Payer: Medicare Other | Admitting: Physician Assistant

## 2023-08-22 ENCOUNTER — Encounter: Payer: Self-pay | Admitting: Oncology

## 2023-08-22 DIAGNOSIS — J101 Influenza due to other identified influenza virus with other respiratory manifestations: Secondary | ICD-10-CM | POA: Diagnosis not present

## 2023-08-22 MED ORDER — OSELTAMIVIR PHOSPHATE 75 MG PO CAPS: 75.0000 mg | ORAL_CAPSULE | Freq: Every day | ORAL | 0 refills | Status: AC

## 2023-08-22 MED ORDER — BENZONATATE 100 MG PO CAPS: 100.0000 mg | ORAL_CAPSULE | Freq: Two times a day (BID) | ORAL | 0 refills | Status: DC | PRN

## 2023-08-22 NOTE — Progress Notes (Signed)
Virtual Visit Consent   [name], you are scheduled for a virtual visit with a [facility] provider today. Just as with appointments in the office, your consent must be obtained to participate. Your consent will be active for this visit and any virtual visit you may have with one of our providers in the next 365 days. If you have a MyChart account, a copy of this consent can be sent to you electronically.  As this is a virtual visit, video technology does not allow for your provider to perform a traditional examination. This may limit your provider's ability to fully assess your condition. If your provider identifies any concerns that need to be evaluated in person or the need to arrange testing (such as labs, EKG, etc.), we will make arrangements to do so. Although advances in technology are sophisticated, we cannot ensure that it will always work on either your end or our end. If the connection with a video visit is poor, the visit may have to be switched to a telephone visit. With either a video or telephone visit, we are not always able to ensure that we have a secure connection.  By engaging in this virtual visit, you consent to the provision of healthcare and authorize for your insurance to be billed (if applicable) for the services provided during this visit. Depending on your insurance coverage, you may receive a charge related to this service.  I need to obtain your verbal consent now. Are you willing to proceed with your visit today? [name] has provided verbal consent on 08/22/2023 for a virtual visit (video or telephone). Laure Kidney, [place]  Date: 08/22/2023 10:15 AM  Virtual Visit via Video Note   I, Laure Kidney, connected with  [name]  ([id number], 12-25-1955) on 08/22/23 at 10:00 AM EST by a video-enabled telemedicine application and verified that I am speaking with the correct person using two identifiers.  Location: Patient: Virtual Visit Location Patient:  Home Provider: Virtual Visit Location Provider: Home Office   I discussed the limitations of evaluation and management by telemedicine and the availability of in person appointments. The patient expressed understanding and agreed to proceed.    History of Present Illness: [name] is a 68 y.o. who identifies as a female who was assigned female at birth, and is being seen today for flu symptoms with positive home flu test.   HPI: Influenza This is a new problem. The current episode started yesterday. The problem occurs constantly. The problem has been unchanged. Associated symptoms include fatigue. Pertinent negatives include no abdominal pain, anorexia, arthralgias, change in bowel habit, chest pain, chills, congestion, coughing, diaphoresis, fever, headaches, joint swelling, myalgias, nausea, neck pain, numbness, rash, sore throat, swollen glands, urinary symptoms, vertigo, visual change, vomiting or weakness. Nothing aggravates the symptoms. The treatment provided mild relief.    Problems:  Patient Active Problem List   Diagnosis Date Noted   Postviral fatigue syndrome 10/22/2022   Nausea without vomiting 03/26/2022   Prediabetes 11/18/2021   Major depression, recurrent, chronic (HCC) 10/01/2021   Hx of adenomatous colonic polyps 01/02/2021   Pulmonary arterial hypertension (HCC) 07/04/2020   Kidney cysts 07/04/2020   Hyperlipidemia 07/04/2020   Depression 07/04/2020   Iron deficiency anemia 10/13/2018   DOE (dyspnea on exertion) 03/23/2018   Obesity, morbid, BMI 40.0-49.9 (HCC) 03/23/2018   Chronic diastolic heart failure (HCC) 03/23/2018   Grade I diastolic dysfunction 07/20/2017   Migraine without aura and without status migrainosus, not  intractable 03/02/2017   Class 3 severe obesity due to excess calories with serious comorbidity and body mass index (BMI) of 40.0 to 44.9 in adult [facility]) 01/13/2017   Hiatal hernia 10/28/2016   Pure hypercholesterolemia 10/28/2016   Chronic  kidney disease 06/01/2015   Gastroesophageal reflux disease 06/01/2015   Goiter 06/01/2015   Essential hypertension 06/01/2015   Chronic pain syndrome 05/28/2015   Fibromyalgia 05/28/2015   OSA (obstructive sleep apnea) 05/28/2015   Adjustment disorder with mixed anxiety and depressed mood 05/28/2015    Allergies:  Allergies  Allergen Reactions   Sulfa Antibiotics Itching   Medications:  Current Outpatient Medications:    albuterol (PROAIR HFA) 108 (90 Base) MCG/ACT inhaler, Inhale 1-2 puffs into the lungs every 4 (four) hours as needed for wheezing or shortness of breath., Disp: 18 g, Rfl: 0   atorvastatin (LIPITOR) 20 MG tablet, Take 1 tablet (20 mg total) by mouth daily., Disp: 90 tablet, Rfl: 3   busPIRone (BUSPAR) 7.5 MG tablet, Take 1 tablet (7.5 mg total) by mouth 2 (two) times daily., Disp: 180 tablet, Rfl: 1   butalbital-acetaminophen-caffeine (FIORICET) 50-325-40 MG tablet, Take 1 tablet by mouth every 6 (six) hours as needed for headache., Disp: 20 tablet, Rfl: 0   cyclobenzaprine (FLEXERIL) 10 MG tablet, Take 1 tablet (10 mg total) by mouth 3 (three) times daily as needed for muscle spasms., Disp: 180 tablet, Rfl: 1   DULoxetine (CYMBALTA) 60 MG capsule, Take 1 capsule (60 mg total) by mouth daily., Disp: 90 capsule, Rfl: 1   fluticasone (FLONASE) 50 MCG/ACT nasal spray, Place 1 spray into both nostrils daily., Disp: 16 g, Rfl: 6   furosemide (LASIX) 20 MG tablet, Take 1 tablet (20 mg total) by mouth 2 (two) times daily as needed. (Patient not taking: Reported on 10/22/2022), Disp: 135 tablet, Rfl: 4   gabapentin (NEURONTIN) 300 MG capsule, Take 1-2 capsules (300-600 mg total) by mouth 4 (four) times daily., Disp: 540 capsule, Rfl: 1   lisinopril (ZESTRIL) 20 MG tablet, Take 1 tablet (20 mg total) by mouth daily., Disp: 90 tablet, Rfl: 1   ondansetron (ZOFRAN-ODT) 4 MG disintegrating tablet, Dissolve 1 tablet (4 mg total) in mouth every 8 (eight) hours as needed for nausea or  vomiting., Disp: 20 tablet, Rfl: 4   pantoprazole (PROTONIX) 40 MG tablet, Take 1 tablet (40 mg total) by mouth daily., Disp: 90 tablet, Rfl: 0   propranolol (INDERAL) 60 MG tablet, Take 1 tablet (60 mg total) by mouth 3 (three) times daily. *please call office to schedule an appt*, Disp: 90 tablet, Rfl: 0   Semaglutide-Weight Management (WEGOVY) 2.4 MG/0.75ML SOAJ, Inject 2.4 mg into the skin once a week., Disp: 3 mL, Rfl: 5   sodium fluoride (SODIUM FLUORIDE 5000 PPM) 1.1 % GEL dental gel, Use before bed.  Do not rinse, Disp: 100 mL, Rfl: 3   topiramate (TOPAMAX) 50 MG tablet, Take 1 tablet (50 mg total) by mouth 2 (two) times daily., Disp: 180 tablet, Rfl: 1   vortioxetine HBr (TRINTELLIX) 10 MG TABS tablet, Take 1 tablet (10 mg total) by mouth daily., Disp: 90 tablet, Rfl: 1  Observations/Objective: Patient is well-developed, well-nourished in no acute distress.  Resting comfortably  at home.  Head is normocephalic, atraumatic.  No labored breathing.  Speech is clear and coherent with logical content.  Patient is alert and oriented at baseline.    Assessment and Plan: 1. Influenza A (Primary)  The patient presents with chills and fatigue  for  a suspected viral syndrome most likely influenza given positive home test. The patient is otherwise well-appearing with a reassuring exam, and I have low suspicion for another emergent source of infection including pneumonia, intra-abdominal infection, UTI, or CNS infection.  Given the patient's risk factors I proceeded with, prescribing empiric Tamiflu.  Discussed strict return precautions and supportive care including hydration, rest, OTC analgesics, and close follow up.  Follow Up Instructions: I discussed the assessment and treatment plan with the patient. The patient was provided an opportunity to ask questions and all were answered. The patient agreed with the plan and demonstrated an understanding of the instructions.  A copy of  instructions were sent to the patient via MyChart unless otherwise noted below.     The patient was advised to call back or seek an in-person evaluation if the symptoms worsen or if the condition fails to improve as anticipated.    Laure Kidney, PA-C

## 2023-08-22 NOTE — Patient Instructions (Signed)
[name], thank you for joining Laure Kidney, PA-C for today's virtual visit.  While this provider is not your primary care provider (PCP), if your PCP is located in our provider database this encounter information will be shared with them immediately following your visit.   A [facility] MyChart account gives you access to today's visit and all your visits, tests, and labs performed at [facility] " click here if you don't have a [facility] MyChart account or go to [email or web address]  Consent: (Patient) [name] provided verbal consent for this virtual visit at the beginning of the encounter.  Current Medications:  Current Outpatient Medications:    albuterol (PROAIR HFA) 108 (90 Base) MCG/ACT inhaler, Inhale 1-2 puffs into the lungs every 4 (four) hours as needed for wheezing or shortness of breath., Disp: 18 g, Rfl: 0   atorvastatin (LIPITOR) 20 MG tablet, Take 1 tablet (20 mg total) by mouth daily., Disp: 90 tablet, Rfl: 3   busPIRone (BUSPAR) 7.5 MG tablet, Take 1 tablet (7.5 mg total) by mouth 2 (two) times daily., Disp: 180 tablet, Rfl: 1   butalbital-acetaminophen-caffeine (FIORICET) 50-325-40 MG tablet, Take 1 tablet by mouth every 6 (six) hours as needed for headache., Disp: 20 tablet, Rfl: 0   cyclobenzaprine (FLEXERIL) 10 MG tablet, Take 1 tablet (10 mg total) by mouth 3 (three) times daily as needed for muscle spasms., Disp: 180 tablet, Rfl: 1   DULoxetine (CYMBALTA) 60 MG capsule, Take 1 capsule (60 mg total) by mouth daily., Disp: 90 capsule, Rfl: 1   fluticasone (FLONASE) 50 MCG/ACT nasal spray, Place 1 spray into both nostrils daily., Disp: 16 g, Rfl: 6   furosemide (LASIX) 20 MG tablet, Take 1 tablet (20 mg total) by mouth 2 (two) times daily as needed. (Patient not taking: Reported on 10/22/2022), Disp: 135 tablet, Rfl: 4   gabapentin (NEURONTIN) 300 MG capsule, Take 1-2 capsules (300-600 mg total) by mouth 4 (four) times daily., Disp: 540  capsule, Rfl: 1   lisinopril (ZESTRIL) 20 MG tablet, Take 1 tablet (20 mg total) by mouth daily., Disp: 90 tablet, Rfl: 1   ondansetron (ZOFRAN-ODT) 4 MG disintegrating tablet, Dissolve 1 tablet (4 mg total) in mouth every 8 (eight) hours as needed for nausea or vomiting., Disp: 20 tablet, Rfl: 4   pantoprazole (PROTONIX) 40 MG tablet, Take 1 tablet (40 mg total) by mouth daily., Disp: 90 tablet, Rfl: 0   propranolol (INDERAL) 60 MG tablet, Take 1 tablet (60 mg total) by mouth 3 (three) times daily. *please call office to schedule an appt*, Disp: 90 tablet, Rfl: 0   Semaglutide-Weight Management (WEGOVY) 2.4 MG/0.75ML SOAJ, Inject 2.4 mg into the skin once a week., Disp: 3 mL, Rfl: 5   sodium fluoride (SODIUM FLUORIDE 5000 PPM) 1.1 % GEL dental gel, Use before bed.  Do not rinse, Disp: 100 mL, Rfl: 3   topiramate (TOPAMAX) 50 MG tablet, Take 1 tablet (50 mg total) by mouth 2 (two) times daily., Disp: 180 tablet, Rfl: 1   vortioxetine HBr (TRINTELLIX) 10 MG TABS tablet, Take 1 tablet (10 mg total) by mouth daily., Disp: 90 tablet, Rfl: 1   Medications ordered in this encounter:  No orders of the defined types were placed in this encounter.    *If you need refills on other medications prior to your next appointment, please contact your pharmacy*  Follow-Up: Call back or seek an in-person evaluation if the symptoms worsen or if the condition fails to improve as  anticipated.  [facility] [phone]  Other Instructions Follow up with your Primary Provider on Monday.    If you have been instructed to have an in-person evaluation today at a local Urgent Care facility, please use the link below. It will take you to a list of all of our available [facility], including address, phone number and hours of operation. Please do not delay care.  [facility]  If you or a family member do not have a primary care provider, use the link below to schedule a visit  and establish care. When you choose a [facility] primary care physician or advanced practice provider, you gain a long-term partner in health. Find a Primary Care Provider  Learn more about [facility] in-office and virtual care options:  - Get Care Now

## 2023-09-02 ENCOUNTER — Other Ambulatory Visit: Payer: Self-pay | Admitting: Emergency Medicine

## 2023-09-02 DIAGNOSIS — M62838 Other muscle spasm: Secondary | ICD-10-CM

## 2023-09-02 DIAGNOSIS — Z8719 Personal history of other diseases of the digestive system: Secondary | ICD-10-CM

## 2023-09-03 ENCOUNTER — Other Ambulatory Visit (HOSPITAL_COMMUNITY): Payer: Self-pay

## 2023-09-03 ENCOUNTER — Other Ambulatory Visit: Payer: Self-pay

## 2023-09-03 MED ORDER — PANTOPRAZOLE SODIUM 40 MG PO TBEC
40.0000 mg | DELAYED_RELEASE_TABLET | Freq: Every day | ORAL | 0 refills | Status: DC
  Filled 2023-09-03: qty 90, 90d supply, fill #0

## 2023-09-03 MED ORDER — CYCLOBENZAPRINE HCL 10 MG PO TABS
10.0000 mg | ORAL_TABLET | Freq: Three times a day (TID) | ORAL | 1 refills | Status: DC | PRN
  Filled 2023-09-03: qty 180, 60d supply, fill #0
  Filled 2023-12-01: qty 180, 60d supply, fill #1

## 2023-09-03 MED ORDER — HYDROCODONE-ACETAMINOPHEN 5-325 MG PO TABS
1.0000 | ORAL_TABLET | ORAL | 0 refills | Status: DC | PRN
  Filled 2023-09-03: qty 12, 2d supply, fill #0

## 2023-10-05 ENCOUNTER — Other Ambulatory Visit (HOSPITAL_COMMUNITY): Payer: Self-pay

## 2023-10-05 ENCOUNTER — Other Ambulatory Visit: Payer: Self-pay | Admitting: Emergency Medicine

## 2023-10-05 ENCOUNTER — Other Ambulatory Visit: Payer: Self-pay

## 2023-10-05 DIAGNOSIS — E785 Hyperlipidemia, unspecified: Secondary | ICD-10-CM

## 2023-10-05 DIAGNOSIS — F32A Depression, unspecified: Secondary | ICD-10-CM

## 2023-10-05 DIAGNOSIS — M797 Fibromyalgia: Secondary | ICD-10-CM

## 2023-10-05 DIAGNOSIS — G43909 Migraine, unspecified, not intractable, without status migrainosus: Secondary | ICD-10-CM

## 2023-10-05 DIAGNOSIS — F411 Generalized anxiety disorder: Secondary | ICD-10-CM

## 2023-10-05 MED ORDER — ATORVASTATIN CALCIUM 20 MG PO TABS
20.0000 mg | ORAL_TABLET | Freq: Every day | ORAL | 3 refills | Status: AC
  Filled 2023-10-05: qty 90, 90d supply, fill #0
  Filled 2024-01-04: qty 90, 90d supply, fill #1
  Filled 2024-04-06: qty 90, 90d supply, fill #2
  Filled 2024-06-30: qty 90, 90d supply, fill #3

## 2023-10-05 MED ORDER — TOPIRAMATE 50 MG PO TABS
50.0000 mg | ORAL_TABLET | Freq: Two times a day (BID) | ORAL | 1 refills | Status: DC
Start: 2023-10-05 — End: 2024-04-06
  Filled 2023-10-05: qty 180, 90d supply, fill #0
  Filled 2024-01-04: qty 180, 90d supply, fill #1

## 2023-10-05 MED ORDER — BUSPIRONE HCL 7.5 MG PO TABS
7.5000 mg | ORAL_TABLET | Freq: Two times a day (BID) | ORAL | 1 refills | Status: DC
Start: 2023-10-05 — End: 2024-04-06
  Filled 2023-10-05: qty 180, 90d supply, fill #0
  Filled 2024-01-04: qty 180, 90d supply, fill #1

## 2023-10-05 MED ORDER — DULOXETINE HCL 60 MG PO CPEP
60.0000 mg | ORAL_CAPSULE | Freq: Every day | ORAL | 1 refills | Status: DC
  Filled 2023-10-05: qty 90, 90d supply, fill #0
  Filled 2024-01-04: qty 90, 90d supply, fill #1

## 2023-10-06 ENCOUNTER — Emergency Department (HOSPITAL_BASED_OUTPATIENT_CLINIC_OR_DEPARTMENT_OTHER)
Admission: EM | Admit: 2023-10-06 | Discharge: 2023-10-06 | Disposition: A | Discharge status: Home / Self Care | Attending: Emergency Medicine | Admitting: Emergency Medicine

## 2023-10-06 ENCOUNTER — Other Ambulatory Visit: Payer: Self-pay

## 2023-10-06 ENCOUNTER — Emergency Department (HOSPITAL_BASED_OUTPATIENT_CLINIC_OR_DEPARTMENT_OTHER)

## 2023-10-06 ENCOUNTER — Ambulatory Visit: Payer: Self-pay | Admitting: Emergency Medicine

## 2023-10-06 ENCOUNTER — Encounter (HOSPITAL_BASED_OUTPATIENT_CLINIC_OR_DEPARTMENT_OTHER): Payer: Self-pay | Admitting: Emergency Medicine

## 2023-10-06 ENCOUNTER — Encounter: Payer: Self-pay | Admitting: Oncology

## 2023-10-06 DIAGNOSIS — R600 Localized edema: Secondary | ICD-10-CM | POA: Insufficient documentation

## 2023-10-06 DIAGNOSIS — I509 Heart failure, unspecified: Secondary | ICD-10-CM | POA: Insufficient documentation

## 2023-10-06 DIAGNOSIS — R0602 Shortness of breath: Secondary | ICD-10-CM | POA: Diagnosis not present

## 2023-10-06 DIAGNOSIS — I130 Hypertensive heart and chronic kidney disease with heart failure and stage 1 through stage 4 chronic kidney disease, or unspecified chronic kidney disease: Secondary | ICD-10-CM | POA: Diagnosis not present

## 2023-10-06 DIAGNOSIS — N189 Chronic kidney disease, unspecified: Secondary | ICD-10-CM | POA: Diagnosis not present

## 2023-10-06 DIAGNOSIS — Z79899 Other long term (current) drug therapy: Secondary | ICD-10-CM | POA: Diagnosis not present

## 2023-10-06 DIAGNOSIS — F419 Anxiety disorder, unspecified: Secondary | ICD-10-CM | POA: Insufficient documentation

## 2023-10-06 DIAGNOSIS — M7989 Other specified soft tissue disorders: Secondary | ICD-10-CM | POA: Diagnosis present

## 2023-10-06 DIAGNOSIS — K449 Diaphragmatic hernia without obstruction or gangrene: Secondary | ICD-10-CM | POA: Diagnosis not present

## 2023-10-06 LAB — COMPREHENSIVE METABOLIC PANEL
ALT: 17 U/L (ref 0–44)
AST: 22 U/L (ref 15–41)
Albumin: 3.5 g/dL (ref 3.5–5.0)
Alkaline Phosphatase: 56 U/L (ref 38–126)
Anion gap: 7 (ref 5–15)
BUN: 8 mg/dL (ref 8–23)
CO2: 24 mmol/L (ref 22–32)
Calcium: 8.7 mg/dL — ABNORMAL LOW (ref 8.9–10.3)
Chloride: 108 mmol/L (ref 98–111)
Creatinine, Ser: 0.87 mg/dL (ref 0.44–1.00)
GFR, Estimated: >60 mL/min (ref >60)
Glucose, Bld: 78 mg/dL (ref 70–99)
Potassium: 3.9 mmol/L (ref 3.5–5.1)
Sodium: 139 mmol/L (ref 135–145)
Total Bilirubin: 0.4 mg/dL (ref 0.0–1.2)
Total Protein: 6.5 g/dL (ref 6.5–8.1)

## 2023-10-06 LAB — CBC
HCT: 30.3 % — ABNORMAL LOW (ref 36.0–46.0)
Hemoglobin: 9 g/dL — ABNORMAL LOW (ref 12.0–15.0)
MCH: 24.5 pg — ABNORMAL LOW (ref 26.0–34.0)
MCHC: 29.7 g/dL — ABNORMAL LOW (ref 30.0–36.0)
MCV: 82.3 fL (ref 80.0–100.0)
Platelets: 213 10*3/uL (ref 150–400)
RBC: 3.68 MIL/uL — ABNORMAL LOW (ref 3.87–5.11)
RDW: 13.9 % (ref 11.5–15.5)
WBC: 4.3 10*3/uL (ref 4.0–10.5)
nRBC: 0 % (ref 0.0–0.2)

## 2023-10-06 LAB — TROPONIN I (HIGH SENSITIVITY)
Troponin I (High Sensitivity): 8 ng/L (ref <18)
Troponin I (High Sensitivity): 12 ng/L (ref <18)

## 2023-10-06 LAB — D-DIMER, QUANTITATIVE: D-Dimer, Quant: 0.59 ug{FEU}/mL — ABNORMAL HIGH (ref 0.00–0.50)

## 2023-10-06 LAB — BRAIN NATRIURETIC PEPTIDE: B Natriuretic Peptide: 110.9 pg/mL — ABNORMAL HIGH (ref 0.0–100.0)

## 2023-10-06 LAB — OCCULT BLOOD X 1 CARD TO LAB, STOOL: Fecal Occult Bld: NEGATIVE

## 2023-10-06 MED ORDER — POTASSIUM CHLORIDE CRYS ER 20 MEQ PO TBCR
40.0000 meq | EXTENDED_RELEASE_TABLET | Freq: Once | ORAL | Status: AC
  Administered 2023-10-06: 40 meq via ORAL
  Filled 2023-10-06: qty 2

## 2023-10-06 MED ORDER — FUROSEMIDE 10 MG/ML IJ SOLN
40.0000 mg | Freq: Once | INTRAMUSCULAR | Status: AC
  Administered 2023-10-06: 40 mg via INTRAVENOUS
  Filled 2023-10-06: qty 4

## 2023-10-06 MED ORDER — FUROSEMIDE 20 MG PO TABS: 20.0000 mg | ORAL_TABLET | Freq: Two times a day (BID) | ORAL | 0 refills | Status: DC

## 2023-10-06 MED ORDER — POTASSIUM CHLORIDE CRYS ER 20 MEQ PO TBCR
20.0000 meq | EXTENDED_RELEASE_TABLET | Freq: Two times a day (BID) | ORAL | 0 refills | Status: DC
Start: 2023-10-06 — End: 2023-10-26

## 2023-10-06 NOTE — Discharge Instructions (Signed)
 Evaluation today was overall reassuring.  Suspect he may have had a mild CHF flare.  I am starting on a 5-day course of oral Lasix along with a potassium supplement.  Please take the entire course and follow-up with her cardiologist.  If you develop worsening shortness of breath or chest pain, cough and fever or any other concerning symptom please return emergency department further evaluation.  For your anemia, recommend you follow-up with your PCP in the next 2 to 3 days for recheck of your labs.  If you start to develop any signs of bleeding or are more short of breath, fatigued or lightheaded or any other concerning symptom please return emergency department further evaluation.

## 2023-10-06 NOTE — ED Triage Notes (Signed)
 Pt POV steady gait- c/o high BP, sent by pcp. C/o shortness of breath, worse with exertion x2 days.  Also reports BLLE swelling x 2 days.   C/o increased feeling of jittery x 1 day. Took buspar today, ineffective.

## 2023-10-06 NOTE — ED Provider Notes (Signed)
 [place] [facility] AT [facility] Provider Note   CSN: [id number] Arrival date & time: 10/06/23  1714     History  Chief Complaint  Patient presents with   Shortness of Breath   Leg Swelling    [name] is a 68 y.o. female.  Patient complains of swelling in her legs and shortness of breath.  Patient reports that she has felt really jittery and shaky for the past 2 days.  Patient denies any fever or chills.  Patient states that she has had some increased stress recently because her daughter has just been diagnosed with illness.  Patient reports her blood pressure was elevated at home.  Patient has a past medical history of congestive heart failure.  Patient denies any recent travel no prolonged sitting she is not on any hormones.  Patient has a past medical history of hypertension congestive heart failure, hyperlipidemia, chronic kidney disease.  The history is provided by the spouse.  Shortness of Breath      Home Medications Prior to Admission medications   Medication Sig Start Date End Date Taking? Authorizing Provider  albuterol (PROAIR HFA) 108 (90 Base) MCG/ACT inhaler Inhale 1-2 puffs into the lungs every 4 (four) hours as needed for wheezing or shortness of breath. 08/21/22   Freddy Finner, NP  atorvastatin (LIPITOR) 20 MG tablet Take 1 tablet (20 mg total) by mouth daily. 10/05/23   Georgina Quint, MD  benzonatate (TESSALON) 100 MG capsule Take 1 capsule (100 mg total) by mouth 2 (two) times daily as needed for cough. 08/22/23   Laure Kidney, PA-C  busPIRone (BUSPAR) 7.5 MG tablet Take 1 tablet (7.5 mg total) by mouth 2 (two) times daily. 10/05/23   Georgina Quint, MD  butalbital-acetaminophen-caffeine [facility]) [phone] MG tablet Take 1 tablet by mouth every 6 (six) hours as needed for headache. 02/20/23   Corwin Levins, MD  cyclobenzaprine (FLEXERIL) 10 MG tablet Take 1 tablet (10 mg total) by mouth 3 (three) times daily as needed  for muscle spasms. 09/03/23   Georgina Quint, MD  DULoxetine (CYMBALTA) 60 MG capsule Take 1 capsule (60 mg total) by mouth daily. 10/05/23   Georgina Quint, MD  fluticasone [facility]) 50 MCG/ACT nasal spray Place 1 spray into both nostrils daily. 02/19/23   Georgina Quint, MD  furosemide (LASIX) 20 MG tablet Take 1 tablet (20 mg total) by mouth 2 (two) times daily as needed. Patient not taking: Reported on 10/22/2022 09/13/19   Lezlie Lye, Meda Coffee, MD  gabapentin (NEURONTIN) 300 MG capsule Take 1-2 capsules (300-600 mg total) by mouth 4 (four) times daily. 07/01/23   Georgina Quint, MD  HYDROcodone-acetaminophen (NORCO/VICODIN) 5-325 MG tablet Take 1 tablet by mouth every 4 (four) - 6(six) hours as needed for pain 09/03/23     lisinopril (ZESTRIL) 20 MG tablet Take 1 tablet (20 mg total) by mouth daily. 04/28/23   Georgina Quint, MD  ondansetron (ZOFRAN-ODT) 4 MG disintegrating tablet Dissolve 1 tablet (4 mg total) in mouth every 8 (eight) hours as needed for nausea or vomiting. 08/10/23   Georgina Quint, MD  pantoprazole (PROTONIX) 40 MG tablet Take 1 tablet (40 mg total) by mouth daily. 09/03/23   Georgina Quint, MD  propranolol (INDERAL) 60 MG tablet Take 1 tablet (60 mg total) by mouth 3 (three) times daily. *please call office to schedule an appt* 08/11/23   Marykay Lex, MD  Semaglutide-Weight Management [facility]) 2.4  MG/0.75ML SOAJ Inject 2.4 mg into the skin once a week. 05/05/22   Georgina Quint, MD  sodium fluoride (SODIUM FLUORIDE 5000 PPM) 1.1 % GEL dental gel Use before bed.  Do not rinse 04/27/23     topiramate (TOPAMAX) 50 MG tablet Take 1 tablet (50 mg total) by mouth 2 (two) times daily. 10/05/23   Georgina Quint, MD  vortioxetine HBr (TRINTELLIX) 10 MG TABS tablet Take 1 tablet (10 mg total) by mouth daily. 04/28/23   Georgina Quint, MD      Allergies    Sulfa antibiotics    Review of Systems   Review of Systems   Respiratory:  Positive for shortness of breath.   All other systems reviewed and are negative.   Physical Exam Updated Vital Signs BP (!) 171/98 (BP Location: Left Arm)   Pulse (!) 113   Temp 98.4 F (36.9 C)   Resp 20   Ht 5\' 4"  (1.626 m)   Wt 93 kg   SpO2 100%   BMI 35.19 kg/m  Physical Exam Vitals and nursing note reviewed.  Constitutional:      Appearance: She is well-developed.  HENT:     Head: Normocephalic.  Eyes:     Pupils: Pupils are equal, round, and reactive to light.  Cardiovascular:     Rate and Rhythm: Normal rate.  Pulmonary:     Effort: Pulmonary effort is normal.     Breath sounds: Normal breath sounds. No decreased breath sounds.  Chest:     Chest wall: No mass or tenderness.  Abdominal:     General: There is no distension.     Palpations: Abdomen is soft.  Musculoskeletal:        General: Normal range of motion.     Cervical back: Normal range of motion.  Skin:    General: Skin is warm.  Neurological:     General: No focal deficit present.     Mental Status: She is alert and oriented to person, place, and time.  Psychiatric:        Mood and Affect: Mood is anxious.     ED Results / Procedures / Treatments   Labs (all labs ordered are listed, but only abnormal results are displayed) Labs Reviewed  CBC  BRAIN NATRIURETIC PEPTIDE  COMPREHENSIVE METABOLIC PANEL  D-DIMER, QUANTITATIVE  TROPONIN I (HIGH SENSITIVITY)    EKG None  Radiology No results found.  Procedures Procedures    Medications Ordered in ED Medications - No data to display  ED Course/ Medical Decision Making/ A&P                                 Medical Decision Making Patient complains of feeling short of breath and lower extremity swelling.  Patient also complains of feeling anxious and jittery.  Amount and/or Complexity of Data Reviewed Independent Historian: spouse    Details: Patient is here with husband who is supportive.  Husband is respiratory  therapy [facility]. External Data Reviewed: notes.    Details: Primary care notes reviewed Labs: ordered. Decision-making details documented in ED Course.    Details: Labs ordered reviewed and interpreted Radiology: ordered and independent interpretation performed. Decision-making details documented in ED Course.    Details: Chest x-ray ordered reviewed and interpreted ECG/medicine tests: ordered and independent interpretation performed. Decision-making details documented in ED Course.    Details: EKG shows sinus tachycardia  no ST changes   Pt's care turned over to Riki Sheer [facility]         Final Clinical Impression(s) / ED Diagnoses Final diagnoses:  Bilateral lower extremity edema  Shortness of breath    Rx / DC Orders ED Discharge Orders     None         Elson Areas, PA-C 10/06/23 1845    Franne Forts, DO 10/06/23 1853

## 2023-10-06 NOTE — ED Provider Notes (Signed)
 Accepted handoff at shift change from Langston Masker, [place]. Please see prior provider note for more detail.   Briefly: Patient is 68 y.o. bilateral lower extremity edema and shortness of breath.  DDX: concern for PE, CHF exacerbation, anemia, other  Plan: Follow-up on labs and x-ray.    Physical Exam  BP 135/89   Pulse 85   Temp 98.3 F (36.8 C) (Oral)   Resp 14   Ht 5\' 4"  (1.626 m)   Wt 93 kg   SpO2 96%   BMI 35.19 kg/m   Physical Exam  Procedures  Procedures  ED Course / MDM   Clinical Course as of 10/06/23 2242  Tue Oct 06, 2023  1845 Swelling in lower ext, shob and HTN. EKG shows tachycardia. D dimer pending. Will need scan if positive.  [JR]    Clinical Course User Index [JR] Gareth Eagle, PA-C   Medical Decision Making Amount and/or Complexity of Data Reviewed Labs: ordered. Radiology: ordered.  Risk Prescription drug management.    X-ray was negative.  BNP was elevated.  Also hemoglobin was 9.0 this downtrending from 13.211 months ago.  Given elevation of her BNP, lower extremity edema and shortness of breath, felt she would benefit from IV diuresis for what could be a mild CHF exacerbation.  Also consider rectal bleeding but rectal exam was reassuring and Hemoccult was negative.  Patient had does have a history of iron deficient anemia.  On reassessment, patient was very responsive to the IV Lasix and stated that her shortness of breath had improved.  She remained without chest pain.  D-dimer was negative per age adjustment rule.  Therefore feel that PE is unlikely.  ACS evaluation was also reassuring.  Started her on a 5-day course of oral Lasix with a potassium supplement.  Advised her to follow-up with her cardiologist.  For her anemia, advised her to follow-up with her PCP in the next 2 to 3 days for recheck of her labs.  Discussed pertinent turn precautions.  Discharged in good condition.      Gareth Eagle, PA-C 10/06/23 2258    Franne Forts,  DO 10/10/23 1731

## 2023-10-06 NOTE — Telephone Encounter (Signed)
 Chief Complaint: HTN Symptoms: HTN, leg swelling, dizziness, SOB on exertion Frequency: "a few days" Pertinent Negatives: Patient denies CP, redness to the legs, pain, skin color change, knot under the skin, skin that is warm to the touch, headache Disposition: [x] ED /[] Urgent Care (no appt availability in office) / [] Appointment(In office/virtual)/ []  [facility]/ [] Home Care/ [] Refused Recommended Disposition /[] [facility]/ []  Follow-up with PCP  Additional Notes: Pt reports a few days of leg swelling and a BP reading today of 157/90. Pt states she checked her BP today d/t leg swelling, but normally she does not check her BP. No missed doses of lisinopril. Endorses dizziness and lightheadedness. Pt states her legs are swollen from the knees to her feet. No skin color changes, no redness, no pain, no knot under the skin, no skin warm to the touch. Pt also reports 20-25 lb weight gain over the last couple months. Pt states "I stay short-winded" (only on exertion) but pt states that might be worse over the last several days. Hx of HTN and HF. RN advised pt to go to the ED and she was agreeable. States her husband will bring her. RN advised pt if she worsens before they leave, for example if she develops CP or worsening SOB, they need to call 911. She verbalized understanding.   Copied from CRM [id number]. Topic: Clinical - Red Word Triage >> Oct 06, 2023  4:18 PM Eunice Blase wrote: Red Word that prompted transfer to Nurse Triage: Pt called stated blood pressure is 157/90 and has swollen legs. Reason for Disposition  [1] Difficulty breathing with exertion (e.g., walking) AND [2] new-onset or worsening  Answer Assessment - Initial Assessment Questions 1. BLOOD PRESSURE: "What is the blood pressure?" "Did you take at least two measurements 5 minutes apart?"     157/90 at 1600 this afternoon 2. ONSET: "When did you take your blood pressure?"     1600 today 3. HOW: "How did you  take your blood pressure?" (e.g., automatic home BP monitor, visiting nurse)     Automatic BP cuff 4. HISTORY: "Do you have a history of high blood pressure?"     Yes - HTN 5. MEDICINES: "Are you taking any medicines for blood pressure?" "Have you missed any doses recently?"     Lisinopril - no missed doses 6. OTHER SYMPTOMS: "Do you have any symptoms?" (e.g., blurred vision, chest pain, difficulty breathing, headache, weakness)     Pt just decided to check her BP today d/t swelling in the legs, usually does not check it. "All day yesterday I felt nervous and jittery all inside and I just thought it was just my nerves", "my head hurts once in a while but no headache right now", "I do get dizzy when I walk a little bit, I noticed that 3-4 days ago", "a little light-headed but not like I am going to pass out", no missed doses of lisinopril. Pt also reports leg swelling she noticed a few days ago, "when it gets to my toes my toes get numb, the top of my feet are swollen real bad, they aren't really painful, just a strange feeling", endorses some toe numbness right now, states leg swelling is not as bad as it was, states leg swelling is to the knee and down, no skin color changes, no knot under the skin, no skin that is warm to the touch, legs feel heavy with walking but no pain, no hx of DVT, no CP, "I get short-winded  a lot, I don't know" "I believe that might be worse today than it has been"  Answer Assessment - Initial Assessment Questions 1. ONSET: "When did the swelling start?" (e.g., minutes, hours, days)     Few days ago 2. LOCATION: "What part of the leg is swollen?"  "Are both legs swollen or just one leg?"     Knees down 3. SEVERITY: "How bad is the swelling?" (e.g., localized; mild, moderate, severe)   - Localized: Small area of swelling localized to one leg.   - MILD pedal edema: Swelling limited to foot and ankle, pitting edema < 1/4 inch (6 mm) deep, rest and elevation eliminate most or  all swelling.   - MODERATE edema: Swelling of lower leg to knee, pitting edema > 1/4 inch (6 mm) deep, rest and elevation only partially reduce swelling.   - SEVERE edema: Swelling extends above knee, facial or hand swelling present.      Knees down 4. REDNESS: "Does the swelling look red or infected?"     No 5. PAIN: "Is the swelling painful to touch?" If Yes, ask: "How painful is it?"   (Scale 1-10; mild, moderate or severe)     No 6. FEVER: "Do you have a fever?" If Yes, ask: "What is it, how was it measured, and when did it start?"      No 7. CAUSE: "What do you think is causing the leg swelling?"     Not sure 8. MEDICAL HISTORY: "Do you have a history of blood clots (e.g., DVT), cancer, heart failure, kidney disease, or liver failure?"     HTN, HF 9. RECURRENT SYMPTOM: "Have you had leg swelling before?" If Yes, ask: "When was the last time?" "What happened that time?"     No 10. OTHER SYMPTOMS: "Do you have any other symptoms?" (e.g., chest pain, difficulty breathing)       Short-winded often, getting worse over the last several days, no CP  Protocols used: Blood Pressure - High-A-AH, Leg Swelling and Edema-A-AH

## 2023-10-08 ENCOUNTER — Encounter: Payer: Self-pay | Admitting: Emergency Medicine

## 2023-10-08 ENCOUNTER — Ambulatory Visit: Admitting: Emergency Medicine

## 2023-10-08 VITALS — BP 104/78 | HR 98 | Temp 98.4°F | Ht 64.0 in | Wt 202.0 lb

## 2023-10-08 DIAGNOSIS — R0609 Other forms of dyspnea: Secondary | ICD-10-CM | POA: Diagnosis not present

## 2023-10-08 DIAGNOSIS — D649 Anemia, unspecified: Secondary | ICD-10-CM | POA: Insufficient documentation

## 2023-10-08 DIAGNOSIS — I10 Essential (primary) hypertension: Secondary | ICD-10-CM | POA: Diagnosis not present

## 2023-10-08 LAB — CBC WITH DIFFERENTIAL/PLATELET
Basophils Absolute: 0 10*3/uL (ref 0.0–0.1)
Basophils Relative: 0.4 % (ref 0.0–3.0)
Eosinophils Absolute: 0.2 10*3/uL (ref 0.0–0.7)
Eosinophils Relative: 3.8 % (ref 0.0–5.0)
HCT: 33.8 % — ABNORMAL LOW (ref 36.0–46.0)
Hemoglobin: 10.5 g/dL — ABNORMAL LOW (ref 12.0–15.0)
Lymphocytes Relative: 25.1 % (ref 12.0–46.0)
Lymphs Abs: 1.2 10*3/uL (ref 0.7–4.0)
MCHC: 31 g/dL (ref 30.0–36.0)
MCV: 80 fl (ref 78.0–100.0)
Monocytes Absolute: 0.5 10*3/uL (ref 0.1–1.0)
Monocytes Relative: 11.2 % (ref 3.0–12.0)
Neutro Abs: 2.9 10*3/uL (ref 1.4–7.7)
Neutrophils Relative %: 59.5 % (ref 43.0–77.0)
Platelets: 280 10*3/uL (ref 150.0–400.0)
RBC: 4.22 Mil/uL (ref 3.87–5.11)
RDW: 14.8 % (ref 11.5–15.5)
WBC: 4.9 10*3/uL (ref 4.0–10.5)

## 2023-10-08 LAB — FOLATE: Folate: >25.2 ng/mL (ref >5.9)

## 2023-10-08 LAB — FERRITIN: Ferritin: 3.5 ng/mL — ABNORMAL LOW (ref 10.0–291.0)

## 2023-10-08 LAB — VITAMIN B12: Vitamin B-12: 248 pg/mL (ref 211–911)

## 2023-10-08 NOTE — Assessment & Plan Note (Signed)
Well-controlled hypertension Continue lisinopril 20 mg daily and propranolol 60 mg 3 times a day Cardiovascular risk associated with hypertension discussed

## 2023-10-08 NOTE — Assessment & Plan Note (Signed)
 Has history of iron deficiency anemia Clinically stable.  No GI bleed.  Negative occult blood stool sample. Anemia panel requested today Came back with very low ferritin level Recommend hematology evaluation and possible iron transfusion treatment Referral placed today.

## 2023-10-08 NOTE — Assessment & Plan Note (Signed)
 Most likely secondary to iron deficiency anemia Doubt congestive heart failure Has appointment with cardiologist next week Chest x-ray from 2 days ago shows no cardiomegaly with clear lungs

## 2023-10-08 NOTE — Progress Notes (Signed)
 [name] 68 y.o.   Chief Complaint  Patient presents with   Hospitalization Follow-up    Patient here for HFU. She went for Edema and her b/p she was also told she is anemic. Her swelling is better and was put on lasix & potassium for 5 days. She still fatigue. She does have an appt with cardiologist soon.     HISTORY OF PRESENT ILLNESS: This is a 68 y.o. female here for follow-up of emergency department visit on 10/06/2023 Assessment as follows: X-ray was negative.  BNP was elevated.  Also hemoglobin was 9.0 this downtrending from 13.211 months ago.  Given elevation of her BNP, lower extremity edema and shortness of breath, felt she would benefit from IV diuresis for what could be a mild CHF exacerbation.  Also consider rectal bleeding but rectal exam was reassuring and Hemoccult was negative.  Patient had does have a history of iron deficient anemia.  On reassessment, patient was very responsive to the IV Lasix and stated that her shortness of breath had improved.  She remained without chest pain.  D-dimer was negative per age adjustment rule.  Therefore feel that PE is unlikely.  ACS evaluation was also reassuring.  Started her on a 5-day course of oral Lasix with a potassium supplement.  Advised her to follow-up with her cardiologist.  For her anemia, advised her to follow-up with her PCP in the next 2 to 3 days for recheck of her labs.  Discussed pertinent turn precautions.  Discharged in good condition.  Final diagnosis: Symptomatic anemia of unknown cause Patient has history of iron deficiency anemia.  4 years ago was given iron transfusions Negative GI workup.  No GI bleeding.  Occult blood stool sample was -2 days ago.  HPI   Prior to Admission medications   Medication Sig Start Date End Date Taking? Authorizing Provider  albuterol (PROAIR HFA) 108 (90 Base) MCG/ACT inhaler Inhale 1-2 puffs into the lungs every 4 (four) hours as needed for wheezing or shortness of breath.  08/21/22  Yes Freddy Finner, NP  atorvastatin (LIPITOR) 20 MG tablet Take 1 tablet (20 mg total) by mouth daily. 10/05/23  Yes Breeze Berringer, Eilleen Kempf, MD  benzonatate (TESSALON) 100 MG capsule Take 1 capsule (100 mg total) by mouth 2 (two) times daily as needed for cough. 08/22/23  Yes McLean, Darius, PA-C  busPIRone (BUSPAR) 7.5 MG tablet Take 1 tablet (7.5 mg total) by mouth 2 (two) times daily. 10/05/23  Yes Anne Boltz, Eilleen Kempf, MD  butalbital-acetaminophen-caffeine [facility]) [phone] MG tablet Take 1 tablet by mouth every 6 (six) hours as needed for headache. 02/20/23  Yes Corwin Levins, MD  cyclobenzaprine (FLEXERIL) 10 MG tablet Take 1 tablet (10 mg total) by mouth 3 (three) times daily as needed for muscle spasms. 09/03/23  Yes Duyen Beckom, Eilleen Kempf, MD  DULoxetine (CYMBALTA) 60 MG capsule Take 1 capsule (60 mg total) by mouth daily. 10/05/23  Yes Chablis Losh, Eilleen Kempf, MD  fluticasone [facility]) 50 MCG/ACT nasal spray Place 1 spray into both nostrils daily. 02/19/23  Yes Olu[place]wa Kinyon, Eilleen Kempf, MD  furosemide (LASIX) 20 MG tablet Take 1 tablet (20 mg total) by mouth 2 (two) times daily. 10/06/23  Yes Gareth Eagle, PA-C  gabapentin (NEURONTIN) 300 MG capsule Take 1-2 capsules (300-600 mg total) by mouth 4 (four) times daily. 07/01/23  Yes Metta Koranda, Eilleen Kempf, MD  HYDROcodone-acetaminophen (NORCO/VICODIN) 5-325 MG tablet Take 1 tablet by mouth every 4 (four) - 6(six) hours as needed for pain  09/03/23  Yes   lisinopril (ZESTRIL) 20 MG tablet Take 1 tablet (20 mg total) by mouth daily. 04/28/23  Yes Lenell Mcconnell, Eilleen Kempf, MD  ondansetron (ZOFRAN-ODT) 4 MG disintegrating tablet Dissolve 1 tablet (4 mg total) in mouth every 8 (eight) hours as needed for nausea or vomiting. 08/10/23  Yes Paulmichael Schreck, Eilleen Kempf, MD  pantoprazole (PROTONIX) 40 MG tablet Take 1 tablet (40 mg total) by mouth daily. 09/03/23  Yes Chastity Noland, Eilleen Kempf, MD  potassium chloride SA (KLOR-CON M) 20 MEQ tablet Take 1 tablet (20 mEq total)  by mouth 2 (two) times daily for 5 days. 10/06/23 10/11/23 Yes Gareth Eagle, PA-C  propranolol (INDERAL) 60 MG tablet Take 1 tablet (60 mg total) by mouth 3 (three) times daily. *please call office to schedule an appt* 08/11/23  Yes Marykay Lex, MD  Semaglutide-Weight Management (WEGOVY) 2.4 MG/0.75ML SOAJ Inject 2.4 mg into the skin once a week. 05/05/22  Yes Dinia Joynt, Eilleen Kempf, MD  sodium fluoride (SODIUM FLUORIDE 5000 PPM) 1.1 % GEL dental gel Use before bed.  Do not rinse 04/27/23  Yes   topiramate (TOPAMAX) 50 MG tablet Take 1 tablet (50 mg total) by mouth 2 (two) times daily. 10/05/23  Yes Kysha Muralles, Eilleen Kempf, MD  vortioxetine HBr (TRINTELLIX) 10 MG TABS tablet Take 1 tablet (10 mg total) by mouth daily. 04/28/23  Yes Georgina Quint, MD    Allergies  Allergen Reactions   Sulfa Antibiotics Itching    Patient Active Problem List   Diagnosis Date Noted   Postviral fatigue syndrome 10/22/2022   Nausea without vomiting 03/26/2022   Prediabetes 11/18/2021   Major depression, recurrent, chronic (HCC) 10/01/2021   Hx of adenomatous colonic polyps 01/02/2021   Pulmonary arterial hypertension (HCC) 07/04/2020   Kidney cysts 07/04/2020   Hyperlipidemia 07/04/2020   Depression 07/04/2020   Iron deficiency anemia 10/13/2018   DOE (dyspnea on exertion) 03/23/2018   Obesity, morbid, BMI 40.0-49.9 (HCC) 03/23/2018   Chronic diastolic heart failure (HCC) 03/23/2018   Grade I diastolic dysfunction 07/20/2017   Migraine without aura and without status migrainosus, not intractable 03/02/2017   Class 3 severe obesity due to excess calories with serious comorbidity and body mass index (BMI) of 40.0 to 44.9 in adult (HCC) 01/13/2017   Hiatal hernia 10/28/2016   Pure hypercholesterolemia 10/28/2016   Chronic kidney disease 06/01/2015   Gastroesophageal reflux disease 06/01/2015   Goiter 06/01/2015   Essential hypertension 06/01/2015   Chronic pain syndrome 05/28/2015   Fibromyalgia  05/28/2015   OSA (obstructive sleep apnea) 05/28/2015   Adjustment disorder with mixed anxiety and depressed mood 05/28/2015    Past Medical History:  Diagnosis Date   Allergy    [name], Flonase   Anemia    Anxiety    Arthritis    Chronic kidney disease    Colon polyps    Depression    Fibromyalgia    Gastritis    GERD (gastroesophageal reflux disease)    Hernia, hiatal    High risk for colon cancer    HLD (hyperlipidemia)    Hypertension    Migraine    Neuropathy    Osteoporosis    Pneumonia    Sleep apnea    c-pap nightly   Sleep apnea with use of continuous positive airway pressure (CPAP)    Thyroid goiter     Past Surgical History:  Procedure Laterality Date   48-Hour Holter Monitor  06/2017   Mostly normal sinus rhythm.  Average heart rate  71 bpm.  Minimum 54 bpm.  Maximum sinus rate 114 bpm.  Rare PACs and PVCs.  No A. fib, atrial flutter, SVT or VT.  One brief run of 8 beat PAT.   ABDOMINAL HYSTERECTOMY     ovaries intact; DUB.  No dysplasia.   FRACTURE SURGERY N/A    [employer] 09/25/2020   JOINT REPLACEMENT     OPEN REDUCTION INTERNAL FIXATION (ORIF) DISTAL RADIAL FRACTURE Right 07/09/2017   Procedure: RIGHT WRIST OPEN REDUCTION INTERNAL FIXATION (ORIF) DISTAL RADIAL FRACTURE AND REPAIR AS INDICATED;  Surgeon: Bradly Bienenstock, MD;  Location: [facility] OR;  Service: Orthopedics;  Laterality: Right;   PARTIAL HYSTERECTOMY     ROTATOR CUFF REPAIR Right 2009   SHOULDER SURGERY Right    shoulder dislocation, fall, and elbow unla nerve   SHOULDER SURGERY Right 2015   labral tear   TRANSTHORACIC ECHOCARDIOGRAM  06/2017    EF 55-60%.  grade 1 diastolic dysfunction.  Otherwise normal   TUBAL LIGATION     ULNAR NERVE REPAIR Right 08/19/2014    Social History   Socioeconomic History   Marital status: Married    Spouse name: Not on file   Number of children: 3   Years of education: Not on file   Highest education level: Not on file  Occupational History   Occupation:  unemployed  Tobacco Use   Smoking status: Former    Current packs/day: 0.00    Types: Cigarettes    Quit date: 08/04/1998    Years since quitting: 25.1   Smokeless tobacco: Never  Vaping Use   Vaping status: Never Used  Substance and Sexual Activity   Alcohol use: No   Drug use: No   Sexual activity: Yes    Birth control/protection: Surgical, Post-menopausal  Other Topics Concern   Not on file  Social History Narrative   Marital status: married x 20 years; moderately happy     Children: 3 children (47 Dimas Aguas, 81 Dianna, 40 April); 8 grandchildren; 0 gg     Lives: with husband/Steve, April, 2 granddaughters     Employment:  Unemployed; quit working 2015 with work related injury; awaiting disability in 2018; disability approved in 02/2017.       Tobacco: quit smoking in 2016; smoked x 2 years.      Alcohol: none      Drugs: none      Exercise: rarely in 2018         Social Drivers of Health   Financial Resource Strain: Low Risk  (03/31/2023)   Overall Financial Resource Strain (CARDIA)    Difficulty of Paying Living Expenses: Not hard at all  Food Insecurity: No Food Insecurity (03/31/2023)   Hunger Vital Sign    Worried About Running Out of Food in the Last Year: Never true    Ran Out of Food in the Last Year: Never true  Transportation Needs: No Transportation Needs (03/31/2023)   PRAPARE - Administrator, Civil Service (Medical): No    Lack of Transportation (Non-Medical): No  Physical Activity: Inactive (03/31/2023)   Exercise Vital Sign    Days of Exercise per Week: 0 days    Minutes of Exercise per Session: 0 min  Stress: No Stress Concern Present (03/31/2023)   [employer] of Occupational Health - Occupational Stress Questionnaire    Feeling of Stress : Not at all  Social Connections: Unknown (03/31/2023)   Social Connection and Isolation Panel [NHANES]    Frequency of Communication with Friends  and Family: More than three times a week    Frequency of  Social Gatherings with Friends and Family: More than three times a week    Attends Religious Services: Not on file    Active Member of Clubs or Organizations: Yes    Attends Banker Meetings: Not on file    Marital Status: Married  Intimate Partner Violence: Not At Risk (03/31/2023)   Humiliation, Afraid, Rape, and Kick questionnaire    Fear of Current or Ex-Partner: No    Emotionally Abused: No    Physically Abused: No    Sexually Abused: No    Family History  Problem Relation Age of Onset   Heart disease Mother 21       AMI/CAD/CHF as cause of death   Hypertension Mother    Hyperlipidemia Mother    Hypothyroidism Mother    Depression Mother    Gout Mother    Heart disease Father 9       AMI   Hypertension Father    Stroke Father 27       mild CVA   Prostate cancer Father    Hyperlipidemia Father    Cancer Father 47       prostate cancer   Cancer Sister        Basal cell carcinoma scalp   Depression Sister    Arthritis Sister    Depression Sister    Cancer Maternal Grandmother        type unknown   Cancer Maternal Grandfather        type unknown   Heart disease Paternal Grandmother    Heart defect Paternal Grandfather    Hypertension Brother    Hyperlipidemia Brother    Cancer Brother        prostate cancer   Melanoma Brother    Hyperlipidemia Brother    Hypertension Brother    Cancer Brother        melanoma   Hypertension Brother    Hyperlipidemia Brother    Melanoma Brother    Cancer Brother 25       melanoma   Hypertension Brother    Depression Brother    Hypertension Brother    Gout Brother    Colon cancer Neg Hx    Esophageal cancer Neg Hx    Stomach cancer Neg Hx    Rectal cancer Neg Hx    Breast cancer Neg Hx      Review of Systems  Constitutional: Negative.  Negative for chills and fever.  HENT: Negative.  Negative for congestion and sore throat.   Respiratory:  Positive for shortness of breath (Dyspnea on exertion).  Negative for cough.   Cardiovascular: Negative.  Negative for chest pain and palpitations.  Gastrointestinal:  Negative for abdominal pain, diarrhea, nausea and vomiting.  Genitourinary: Negative.  Negative for dysuria and hematuria.  Skin: Negative.  Negative for rash.  Neurological: Negative.  Negative for dizziness and headaches.  All other systems reviewed and are negative.   Today's Vitals   10/08/23 1309  BP: 104/78  Pulse: 98  Temp: 98.4 F (36.9 C)  TempSrc: Oral  SpO2: 94%  Weight: 202 lb (91.6 kg)  Height: 5\' 4"  (1.626 m)   Body mass index is 34.67 kg/m.   Physical Exam Vitals reviewed.  Constitutional:      Appearance: Normal appearance.  HENT:     Head: Normocephalic.     Mouth/Throat:     Mouth: Mucous membranes are moist.  Pharynx: Oropharynx is clear.  Eyes:     Extraocular Movements: Extraocular movements intact.     Pupils: Pupils are equal, round, and reactive to light.     Comments: Pale conjunctiva  Cardiovascular:     Rate and Rhythm: Normal rate and regular rhythm.     Pulses: Normal pulses.     Heart sounds: Normal heart sounds.  Pulmonary:     Effort: Pulmonary effort is normal.     Breath sounds: Normal breath sounds.  Abdominal:     Palpations: Abdomen is soft.     Tenderness: There is no abdominal tenderness.  Musculoskeletal:     Cervical back: No tenderness.     Right lower leg: No edema.     Left lower leg: No edema.  Lymphadenopathy:     Cervical: No cervical adenopathy.  Skin:    Capillary Refill: Capillary refill takes less than 2 seconds.     Coloration: Skin is pale.  Neurological:     General: No focal deficit present.     Mental Status: She is alert and oriented to person, place, and time.  Psychiatric:        Mood and Affect: Mood normal.        Behavior: Behavior normal.    Results for orders placed or performed in visit on 10/08/23 (from the past 24 hours)  CBC with Differential/Platelet     Status: Abnormal    Collection Time: 10/08/23  1:48 PM  Result Value Ref Range   WBC 4.9 4.0 - 10.5 K/uL   RBC 4.22 3.87 - 5.11 Mil/uL   Hemoglobin 10.5 (L) 12.0 - 15.0 g/dL   HCT 86.5 (L) 78.4 - 69.6 %   MCV 80.0 78.0 - 100.0 fl   MCHC 31.0 30.0 - 36.0 g/dL   RDW 29.5 28.4 - 13.2 %   Platelets 280.0 150.0 - 400.0 K/uL   Neutrophils Relative % 59.5 43.0 - 77.0 %   Lymphocytes Relative 25.1 12.0 - 46.0 %   Monocytes Relative 11.2 3.0 - 12.0 %   Eosinophils Relative 3.8 0.0 - 5.0 %   Basophils Relative 0.4 0.0 - 3.0 %   Neutro Abs 2.9 1.4 - 7.7 K/uL   Lymphs Abs 1.2 0.7 - 4.0 K/uL   Monocytes Absolute 0.5 0.1 - 1.0 K/uL   Eosinophils Absolute 0.2 0.0 - 0.7 K/uL   Basophils Absolute 0.0 0.0 - 0.1 K/uL  Ferritin     Status: Abnormal   Collection Time: 10/08/23  1:48 PM  Result Value Ref Range   Ferritin 3.5 (L) 10.0 - 291.0 ng/mL  Folate     Status: None   Collection Time: 10/08/23  1:48 PM  Result Value Ref Range   Folate >25.2 >5.9 ng/mL  Vitamin B12     Status: None   Collection Time: 10/08/23  1:48 PM  Result Value Ref Range   Vitamin B-12 248 211 - 911 pg/mL     ASSESSMENT & PLAN: A total of 47 minutes was spent with the patient and counseling/coordination of care regarding preparing for this visit, review of most recent office visit notes, review of recent emergency department visit notes, review of multiple chronic medical conditions and their management, review of all medications, review of most recent bloodwork results, review of most recent chest x-ray report, review of health maintenance items, education on nutrition, prognosis, documentation, and need for follow up.   Problem List Items Addressed This Visit       Cardiovascular and  Mediastinum   Essential hypertension (Chronic)   Well-controlled hypertension Continue lisinopril 20 mg daily and propranolol 60 mg 3 times a day Cardiovascular risk associated with hypertension discussed        Other   DOE (dyspnea on exertion)    Most likely secondary to iron deficiency anemia Doubt congestive heart failure Has appointment with cardiologist next week Chest x-ray from 2 days ago shows no cardiomegaly with clear lungs      Symptomatic anemia - Primary   Has history of iron deficiency anemia Clinically stable.  No GI bleed.  Negative occult blood stool sample. Anemia panel requested today Came back with very low ferritin level Recommend hematology evaluation and possible iron transfusion treatment Referral placed today.      Relevant Orders   Vitamin B12 (Completed)   Folate (Completed)   Iron and TIBC   Ferritin (Completed)   CBC with Differential/Platelet (Completed)   Ambulatory referral to Hematology / Oncology   Patient Instructions  Anemia  Anemia is a condition in which there are not enough red blood cells or hemoglobin in the blood. Hemoglobin is a substance in red blood cells that carries oxygen. When you do not have enough red blood cells or hemoglobin (are anemic), your body cannot get enough oxygen, and your organs may not work properly. As a result, you may feel very tired or have other problems. What are the causes? Common causes of anemia include: Excessive bleeding. Anemia can be caused by excessive bleeding inside or outside the body, including bleeding from the intestines or from heavy menstrual periods in females. Poor nutrition. Long-lasting (chronic) kidney, thyroid, and liver disease. Bone marrow disorders, spleen problems, and blood disorders. Cancer and treatments for cancer. Human immunodeficiency virus (HIV) and acquired immunodeficiency syndrome (AIDS). Infections, medicines, and autoimmune disorders that destroy red blood cells. What are the signs or symptoms? Symptoms of this condition include: Minor weakness. Dizziness. Headache, or difficulties concentrating and sleeping. Heartbeats that feel irregular or faster than normal (palpitations). Shortness of breath,  especially with exercise. Pale skin, lips, and nails, or cold hands and feet. Upset stomach (indigestion) and nausea. Symptoms may occur suddenly or develop slowly. If your anemia is mild, you may not have symptoms. How is this diagnosed? This condition is diagnosed based on blood tests, your medical history, and a physical exam. In some cases, a test may be needed in which cells are removed from the soft tissue inside of a bone and looked at under a microscope (bone marrow biopsy). Your health care provider may also check your stool (feces) for blood and may do more testing to look for the cause of your bleeding. Other tests may include: Imaging tests, such as a CT scan or MRI. A procedure to see inside your esophagus and stomach (endoscopy). The esophagus is the part of the body that moves food from your mouth to your stomach. A procedure to see inside your colon and rectum (colonoscopy). How is this treated? Treatment for this condition depends on the cause. If you continue to lose a lot of blood, you may need to be treated at a hospital. Treatment may include: Taking supplements of iron, vitamin B12, or folic acid. Taking a hormone medicine (erythropoietin) that can help to stimulate red blood cell growth. Receiving donated blood through an IV (blood transfusion). This may be needed if you lose a lot of blood. Making changes to your diet. Having surgery to remove your spleen. Follow these instructions at home: Take over-the-counter  and prescription medicines only as told by your health care provider. Take supplements only as told by your health care provider. Follow any diet instructions that you were given by your health care provider. Keep all follow-up visits. Your health care provider will want to recheck your blood tests. Contact a health care provider if: You develop new bleeding anywhere in the body. You are very weak. Get help right away if: You are short of breath. You have  pain in your abdomen or chest. You are dizzy or feel faint. You have trouble concentrating. You have bloody stools, black stools, or tarry stools. You vomit repeatedly or you vomit up blood. These symptoms may be an emergency. Get help right away. Call 911. Do not wait to see if the symptoms will go away. Do not drive yourself to the hospital. Summary Anemia is a condition in which you do not have enough red blood cells or enough of a substance in your red blood cells that carries oxygen. Symptoms may occur suddenly or develop slowly. If your anemia is mild, you may not have symptoms. This condition is diagnosed with blood tests, a medical history, and a physical exam. Other tests may be needed. Treatment for this condition depends on the cause of the anemia. This information is not intended to replace advice given to you by your health care provider. Make sure you discuss any questions you have with your health care provider. Document Revised: 10/14/2021 Document Reviewed: 10/14/2021 Elsevier Patient Education  2024 [employer]    Edwina Barth, MD [facility]

## 2023-10-08 NOTE — Patient Instructions (Signed)

## 2023-10-09 LAB — IRON, TOTAL/TOTAL IRON BINDING CAP
%SAT: 6 % — ABNORMAL LOW (ref 16–45)
Iron: 25 ug/dL — ABNORMAL LOW (ref 45–160)
TIBC: 440 ug/dL (ref 250–450)

## 2023-10-15 NOTE — Progress Notes (Unsigned)
 Cardiology Office Note:    Date:  10/16/2023  ID:  [name], DOB May 02, 1956, MRN [id number] PCP: Georgina Quint, MD  [facility] Providers Cardiologist:  Bryan Lemma, MD       Patient Profile:      Chief Complaint: Emergency department follow-up for leg swelling and shortness of breath  History of Present Illness:  [name] is a 68 y.o. female with visit-pertinent history of nonobstructive CAD, hypertension, hyperlipidemia, chronic diastolic heart failure, fibromyalgia, iron deficiency anemia, obstructive sleep apnea, obesity  She established with cardiology service on 06/2017 for tachycardia.  Holter monitor 06/2017 predominant rhythm is sinus rhythm with average heart rate of 71 bpm, rare PACs, 1 run of PAT (8 beats), no atrial fibrillation, atrial flutter, SVT, VT noted.  Echocardiogram 10/2018 showed LVEF 60-65%, no RWMA, normal diastolic parameters, RV size and function normal, biatrial size mildly dilated, no valvular abnormalities.  Coronary CTA 06/2019 showed coronary calcium score of 10 (64th percentile) with proximal focal calcification of the RCA associated with 0-24% stenosis.  Over read CT showed enlarged pulmonary trunk indicated with pulmonary arterial hypertension.  She was last seen in cardiology office on 04/2021.  She continued to have DOE which is unchanged.  She describes occasional atypical chest discomfort however no exertional chest pains.  She had occasional lower extremity edema which have been unchanged.  No further testing was indicated and she was to follow-up in 1 year.  She was recently seen in the emergency department on 10/06/2023 for complaints of swelling on her legs and shortness of breath.  Her chest x-ray showed no active cardiopulmonary disease.  BNP was elevated at 110.9 and hemoglobin was abnormal at 9.0.  High-sensitivity troponin was unremarkable.  She was given 1 dose of 40 mg IV Lasix and stated her shortness of breath had  improved.  She was started on a 5-day course of oral Lasix with potassium supplement and advised to follow-up with cardiology.   Discussed the use of AI scribe software for clinical note transcription with the patient, who gave verbal consent to proceed.  Today patient arrives to clinic by herself and presents with concerns about recent weight gain, SOB, and persistent leg swelling.  She notes since her ED visit, the 5 days of Lasix has slightly improved her lower extremity swelling and SOB, however, she still feels a bit swollen, particularly in her legs and abdomen.  She notes that she feels constantly bloated and she has had to go up a pant size.  She also reports a significant weight gain of approximately 37 pounds since December, despite no significant changes in her diet. She has a history of migraines and is currently on propranolol, but she has been out of the medication for about a week and a half.  She notes her headaches have been much improved and she is not fond of the 3-day dosing for this medication.  She also uses a CPAP machine for sleep apnea but has not been using it regularly. She reports feeling short of breath, which is a usual symptom for her, but it seems to have worsened recently.  She does have history of anemia with recent drop in hemoglobin during her ED visit, however her fecal occult stool was negative.  She has appointment with hematology in April.  She is without any exertional angina, chest pains, syncope, presyncope, lightheadedness, dizziness, palpitations, PND, orthopnea.    Review of systems:  Please see the history of present illness. All  other systems are reviewed and otherwise negative.     Home Medications:    Current Meds  Medication Sig   albuterol (PROAIR HFA) 108 (90 Base) MCG/ACT inhaler Inhale 1-2 puffs into the lungs every 4 (four) hours as needed for wheezing or shortness of breath.   atorvastatin (LIPITOR) 20 MG tablet Take 1 tablet (20 mg total) by  mouth daily.   benzonatate (TESSALON) 100 MG capsule Take 1 capsule (100 mg total) by mouth 2 (two) times daily as needed for cough.   busPIRone (BUSPAR) 7.5 MG tablet Take 1 tablet (7.5 mg total) by mouth 2 (two) times daily.   butalbital-acetaminophen-caffeine (FIORICET) 50-325-40 MG tablet Take 1 tablet by mouth every 6 (six) hours as needed for headache.   cyclobenzaprine (FLEXERIL) 10 MG tablet Take 1 tablet (10 mg total) by mouth 3 (three) times daily as needed for muscle spasms.   DULoxetine (CYMBALTA) 60 MG capsule Take 1 capsule (60 mg total) by mouth daily.   fluticasone (FLONASE) 50 MCG/ACT nasal spray Place 1 spray into both nostrils daily.   furosemide (LASIX) 20 MG tablet Take 1 tablet (20 mg total) by mouth 2 (two) times daily.   gabapentin (NEURONTIN) 300 MG capsule Take 1-2 capsules (300-600 mg total) by mouth 4 (four) times daily.   HYDROcodone-acetaminophen (NORCO/VICODIN) 5-325 MG tablet Take 1 tablet by mouth every 4 (four) - 6(six) hours as needed for pain   lisinopril (ZESTRIL) 20 MG tablet Take 1 tablet (20 mg total) by mouth daily.   ondansetron (ZOFRAN-ODT) 4 MG disintegrating tablet Dissolve 1 tablet (4 mg total) in mouth every 8 (eight) hours as needed for nausea or vomiting.   pantoprazole (PROTONIX) 40 MG tablet Take 1 tablet (40 mg total) by mouth daily.   propranolol (INDERAL) 60 MG tablet Take 1 tablet (60 mg total) by mouth 3 (three) times daily. *please call office to schedule an appt*   Semaglutide-Weight Management (WEGOVY) 2.4 MG/0.75ML SOAJ Inject 2.4 mg into the skin once a week.   sodium fluoride (SODIUM FLUORIDE 5000 PPM) 1.1 % GEL dental gel Use before bed.  Do not rinse   topiramate (TOPAMAX) 50 MG tablet Take 1 tablet (50 mg total) by mouth 2 (two) times daily.   vortioxetine HBr (TRINTELLIX) 10 MG TABS tablet Take 1 tablet (10 mg total) by mouth daily.   Studies Reviewed:       Coronary CTA 06/07/2019 1. Coronary calcium score of 10. This was 73  percentile for age and sex matched control.   2. Normal coronary origin with right dominance.   3. There is proximal focal calcification of the RCA with associated 0-24% stenosis. Non flow limiting disease. CAD-RADS 1.   4. Mildly dilated right ventricle (44 mm) and mildly dilated right atrium.  Echocardiogram 10/05/2018 1. The left ventricle has normal systolic function with an ejection  fraction of 60-65%. The cavity size was normal. Left ventricular diastolic  parameters were normal No evidence of left ventricular regional wall  motion abnormalities.   2. The right ventricle has normal systolic function. The cavity was  normal. There is no increase in right ventricular wall thickness.   3. Left atrial size was mildly dilated.   4. Right atrial size was mildly dilated.   5. The mitral valve is normal in structure.   6. The tricuspid valve is normal in structure.   7. The aortic valve is normal in structure.   8. The pulmonic valve was normal in structure.   9.  No evidence of left ventricular regional wall motion abnormalities.  Risk Assessment/Calculations:             Physical Exam:   VS:  BP 124/70 (BP Location: Right Arm, Patient Position: Sitting, Cuff Size: Normal)   Pulse (!) 103   Ht 5\' 4"  (1.626 m)   Wt 207 lb 12.8 oz (94.3 kg)   SpO2 98%   BMI 35.67 kg/m    Wt Readings from Last 3 Encounters:  10/16/23 207 lb 12.8 oz (94.3 kg)  10/08/23 202 lb (91.6 kg)  10/06/23 205 lb (93 kg)    GEN: Well nourished, well developed in no acute distress NECK: No JVD; No carotid bruits CARDIAC: RRR, no murmurs, rubs, gallops RESPIRATORY:  Clear to auscultation without rales, wheezing or rhonchi  ABDOMEN: Soft, non-tender, non-distended EXTREMITIES: 1+ bilateral lower extremity edema; No acute deformity      Assessment and Plan:  Chronic diastolic heart failure Previously seen in ED for exacerbation.  BNP elevated at 110.  She reports recent 37 pound weight gain since  December despite no changes to diet, having to go up in pant size.  She endorses lower extremity edema and DOE.  However she is without any orthopnea, dyspnea at rest, or PND.  On physical exam lungs clear to auscultation with 1+ bilateral lower extremity edema.  She does have some abdominal distention. -She has been without propranolol for a week and a half, was on this for history of headaches.  She notes headaches have been under much improved control over the last several years, she also is not fond of TID dosing.  -Will discontinue propranolol 60 mg TID and start patient on metoprolol-XL 25 mg daily in order to optimize GDMT -Start Lasix 20 mg once daily with potassium chloride 20 mEq -Ordered repeat echocardiogram to reassess LV today given previous in 2020 -BMET x 2 weeks  Coronary artery disease / Hyperlipidemia Coronary CTA 06/24/2019 with coronary calcium score 10 (64th percentile) Most recent LDL 64 on 10/2022, and under good control less than 70 Primarily managed by PCP -Today she is without any anginal symptoms, there is no indication for further ischemic evaluation at this time -Continue atorvastatin 20 mg daily  Hypertension Blood pressure today is under excellent control at 124/70 -Continue lisinopril 20 mg daily  Obstructive sleep apnea Not currently adherent to CPAP.  We spoke in great detail of positives and negatives of CPAP therapy as severe OSA that is left untreated could worsen HF symptoms.  She plans to begin back on CPAP therapy over the weekend  Anemia Hemoglobin of 10.5 on 10/2023 Recent fecal occult stool in ED was negative -She has appointment scheduled with hematology in 4/25            Dispo: She has prescheduled follow-up visit with Dr. Herbie Baltimore in 2 weeks for which she would like to keep.  I am okay with this, she will need to have echocardiogram completed prior to the visit.  Signed, Denyce Robert, NP

## 2023-10-16 ENCOUNTER — Other Ambulatory Visit (HOSPITAL_COMMUNITY): Payer: Self-pay

## 2023-10-16 ENCOUNTER — Ambulatory Visit: Attending: Emergency Medicine | Admitting: Emergency Medicine

## 2023-10-16 ENCOUNTER — Encounter: Payer: Self-pay | Admitting: Emergency Medicine

## 2023-10-16 VITALS — BP 124/70 | HR 103 | Ht 64.0 in | Wt 207.8 lb

## 2023-10-16 DIAGNOSIS — I2510 Atherosclerotic heart disease of native coronary artery without angina pectoris: Secondary | ICD-10-CM

## 2023-10-16 DIAGNOSIS — I10 Essential (primary) hypertension: Secondary | ICD-10-CM

## 2023-10-16 DIAGNOSIS — I5032 Chronic diastolic (congestive) heart failure: Secondary | ICD-10-CM | POA: Diagnosis not present

## 2023-10-16 DIAGNOSIS — D649 Anemia, unspecified: Secondary | ICD-10-CM

## 2023-10-16 DIAGNOSIS — G4733 Obstructive sleep apnea (adult) (pediatric): Secondary | ICD-10-CM | POA: Diagnosis not present

## 2023-10-16 DIAGNOSIS — E785 Hyperlipidemia, unspecified: Secondary | ICD-10-CM

## 2023-10-16 MED ORDER — POTASSIUM CHLORIDE CRYS ER 20 MEQ PO TBCR
20.0000 meq | EXTENDED_RELEASE_TABLET | Freq: Every day | ORAL | 1 refills | Status: DC
  Filled 2023-10-16: qty 90, 90d supply, fill #0
  Filled 2024-03-16 (×2): qty 90, 90d supply, fill #1

## 2023-10-16 MED ORDER — FUROSEMIDE 20 MG PO TABS
20.0000 mg | ORAL_TABLET | Freq: Every day | ORAL | 1 refills | Status: AC
  Filled 2023-10-16: qty 90, 90d supply, fill #0
  Filled 2024-03-16 (×2): qty 90, 90d supply, fill #1

## 2023-10-16 MED ORDER — METOPROLOL SUCCINATE ER 25 MG PO TB24
25.0000 mg | ORAL_TABLET | Freq: Every day | ORAL | 1 refills | Status: DC
  Filled 2023-10-16: qty 90, 90d supply, fill #0
  Filled 2024-01-14 (×2): qty 90, 90d supply, fill #1

## 2023-10-16 NOTE — Patient Instructions (Addendum)
 Medication Instructions:  START LASIX 20 MG DAILY. START POTASSIUM CHLORIDE 20 MEQ DAILY. START METOPROLOL SUCCINATE 25 MG DAILY. START BACK ON YOUR CPAP. STOP TAKING PROPRANOLOL.  Lab Work: NONE   Testing/Procedures: Your physician has requested that you have an ECHOCARDIOGRAM. Echocardiography is a painless test that uses sound waves to create images of your heart. It provides your doctor with information about the size and shape of your heart and how well your heart's chambers and valves are working. This procedure takes approximately one hour. There are no restrictions for this procedure. Please do NOT wear cologne, perfume, aftershave, or lotions (deodorant is allowed). Please arrive 15 minutes prior to your appointment time.  Please note: We ask at that you not bring children with you during ultrasound (echo/ vascular) testing. Due to room size and safety concerns, children are not allowed in the ultrasound rooms during exams. Our front office staff cannot provide observation of children in our lobby area while testing is being conducted. An adult accompanying a patient to their appointment will only be allowed in the ultrasound room at the discretion of the ultrasound technician under special circumstances. We apologize for any inconvenience.    Follow-Up: At [facility], you and your health needs are our priority.  As part of our continuing mission to provide you with exceptional heart care, we have created designated Provider Care Teams.  These Care Teams include your primary Cardiologist (physician) and Advanced Practice Providers (APPs -  Physician Assistants and Nurse Practitioners) who all work together to provide you with the care you need, when you need it.    Your next appointment:   KEEP UPCOMING APPOINTMENT WITH DR. HARDING  Provider:   Bryan Lemma, MD  Other Instructions:

## 2023-10-20 ENCOUNTER — Ambulatory Visit (HOSPITAL_COMMUNITY): Attending: Cardiovascular Disease

## 2023-10-20 DIAGNOSIS — I5032 Chronic diastolic (congestive) heart failure: Secondary | ICD-10-CM | POA: Diagnosis not present

## 2023-10-20 DIAGNOSIS — I10 Essential (primary) hypertension: Secondary | ICD-10-CM | POA: Diagnosis not present

## 2023-10-20 LAB — ECHOCARDIOGRAM COMPLETE: S' Lateral: 2.35 cm

## 2023-10-26 ENCOUNTER — Encounter: Payer: Self-pay | Admitting: Emergency Medicine

## 2023-10-26 ENCOUNTER — Ambulatory Visit (INDEPENDENT_AMBULATORY_CARE_PROVIDER_SITE_OTHER): Payer: Commercial Managed Care - PPO | Admitting: Emergency Medicine

## 2023-10-26 ENCOUNTER — Other Ambulatory Visit (HOSPITAL_BASED_OUTPATIENT_CLINIC_OR_DEPARTMENT_OTHER): Payer: Self-pay

## 2023-10-26 ENCOUNTER — Encounter: Payer: Self-pay | Admitting: Oncology

## 2023-10-26 ENCOUNTER — Other Ambulatory Visit (HOSPITAL_COMMUNITY): Payer: Self-pay

## 2023-10-26 VITALS — BP 126/82 | HR 72 | Temp 98.2°F | Ht 64.0 in | Wt 208.0 lb

## 2023-10-26 DIAGNOSIS — F5104 Psychophysiologic insomnia: Secondary | ICD-10-CM

## 2023-10-26 DIAGNOSIS — Z0001 Encounter for general adult medical examination with abnormal findings: Secondary | ICD-10-CM

## 2023-10-26 DIAGNOSIS — Z1329 Encounter for screening for other suspected endocrine disorder: Secondary | ICD-10-CM | POA: Diagnosis not present

## 2023-10-26 DIAGNOSIS — Z13228 Encounter for screening for other metabolic disorders: Secondary | ICD-10-CM | POA: Diagnosis not present

## 2023-10-26 DIAGNOSIS — G4733 Obstructive sleep apnea (adult) (pediatric): Secondary | ICD-10-CM | POA: Diagnosis not present

## 2023-10-26 DIAGNOSIS — F339 Major depressive disorder, recurrent, unspecified: Secondary | ICD-10-CM

## 2023-10-26 DIAGNOSIS — K219 Gastro-esophageal reflux disease without esophagitis: Secondary | ICD-10-CM | POA: Diagnosis not present

## 2023-10-26 DIAGNOSIS — Z13220 Encounter for screening for lipoid disorders: Secondary | ICD-10-CM

## 2023-10-26 DIAGNOSIS — Z130 Encounter for screening for diseases of the blood and blood-forming organs and certain disorders involving the immune mechanism: Secondary | ICD-10-CM | POA: Diagnosis not present

## 2023-10-26 DIAGNOSIS — I10 Essential (primary) hypertension: Secondary | ICD-10-CM | POA: Diagnosis not present

## 2023-10-26 DIAGNOSIS — I5032 Chronic diastolic (congestive) heart failure: Secondary | ICD-10-CM

## 2023-10-26 DIAGNOSIS — Z0000 Encounter for general adult medical examination without abnormal findings: Secondary | ICD-10-CM | POA: Diagnosis not present

## 2023-10-26 LAB — COMPREHENSIVE METABOLIC PANEL
ALT: 11 U/L (ref 0–35)
AST: 14 U/L (ref 0–37)
Albumin: 4.1 g/dL (ref 3.5–5.2)
Alkaline Phosphatase: 61 U/L (ref 39–117)
BUN: 18 mg/dL (ref 6–23)
CO2: 25 meq/L (ref 19–32)
Calcium: 9 mg/dL (ref 8.4–10.5)
Chloride: 110 meq/L (ref 96–112)
Creatinine, Ser: 1.02 mg/dL (ref 0.40–1.20)
GFR: 56.86 mL/min — ABNORMAL LOW (ref >60.00)
Glucose, Bld: 97 mg/dL (ref 70–99)
Potassium: 4.5 meq/L (ref 3.5–5.1)
Sodium: 141 meq/L (ref 135–145)
Total Bilirubin: 0.3 mg/dL (ref 0.2–1.2)
Total Protein: 6.8 g/dL (ref 6.0–8.3)

## 2023-10-26 LAB — LIPID PANEL
Cholesterol: 148 mg/dL (ref 0–200)
HDL: 62.6 mg/dL (ref >39.00)
LDL Cholesterol: 71 mg/dL (ref 0–99)
NonHDL: 85.81
Total CHOL/HDL Ratio: 2
Triglycerides: 76 mg/dL (ref 0.0–149.0)
VLDL: 15.2 mg/dL (ref 0.0–40.0)

## 2023-10-26 LAB — TSH: TSH: 1.2 u[IU]/mL (ref 0.35–5.50)

## 2023-10-26 LAB — HEMOGLOBIN A1C: Hgb A1c MFr Bld: 6.1 % (ref 4.6–6.5)

## 2023-10-26 MED ORDER — ZOLPIDEM TARTRATE 5 MG PO TABS
5.0000 mg | ORAL_TABLET | Freq: Every evening | ORAL | 1 refills | Status: DC | PRN
  Filled 2023-10-26: qty 15, 15d supply, fill #0
  Filled 2024-01-14 (×2): qty 15, 15d supply, fill #1

## 2023-10-26 MED ORDER — WEGOVY 0.25 MG/0.5ML ~~LOC~~ SOAJ
0.2500 mg | SUBCUTANEOUS | 3 refills | Status: DC
  Filled 2023-10-26: qty 2, 28d supply, fill #0

## 2023-10-26 NOTE — Assessment & Plan Note (Signed)
 Well-controlled hypertension Continue lisinopril 20 mg daily Propranolol recently discontinued and started on metoprolol succinate 25 mg daily Cardiovascular risk associated with hypertension discussed

## 2023-10-26 NOTE — Assessment & Plan Note (Signed)
 No signs of congestive heart failure Last echocardiogram report reviewed Taking furosemide 20 mg daily Most recent cardiologist office visit notes reviewed

## 2023-10-26 NOTE — Assessment & Plan Note (Signed)
Stable and asymptomatic at present time Only takes Protonix 40 mg as needed

## 2023-10-26 NOTE — Assessment & Plan Note (Signed)
 Started gaining weight back Diet and nutrition discussed Recommend to restart GLP-1 agonist as covered by insurance

## 2023-10-26 NOTE — Patient Instructions (Signed)

## 2023-10-26 NOTE — Progress Notes (Signed)
 [name] 68 y.o.   Chief Complaint  Patient presents with   Annual Exam    Patient here for physical. Patient also mentions f/u on anemia and her heart. She had and echo gram done recently and has a f/u with cardiologist 4/1 and sees hematology on 4/2. She also mentions having jitters and feeling anxious.     HISTORY OF PRESENT ILLNESS: This is a 68 y.o. female here for annual exam. Recently seen by me for iron deficiency anemia.  Has follow-up appointment with hematologist next week Also recently saw cardiologist due to peripheral edema and possible congestive heart failure. Echocardiogram showed grade 1 diastolic dysfunction.  Was started on Lasix. Also concerned about gaining weight.  Unable to lose it.  Was successful with GLP agonist in the past.  Would like to try it again. Has occasional back pains Has history of depression on Cymbalta and Trintellix.  Needs new referral to behavioral health.  Did not like previous therapist. Has occasional jitters and anxiety episodes.  Last cardiologist office visit notes assessment and plan as follows: Assessment and Plan:  Chronic diastolic heart failure Previously seen in ED for exacerbation.  BNP elevated at 110.  She reports recent 37 pound weight gain since December despite no changes to diet, having to go up in pant size.  She endorses lower extremity edema and DOE.  However she is without any orthopnea, dyspnea at rest, or PND.  On physical exam lungs clear to auscultation with 1+ bilateral lower extremity edema.  She does have some abdominal distention. -She has been without propranolol for a week and a half, was on this for history of headaches.  She notes headaches have been under much improved control over the last several years, she also is not fond of TID dosing.  -Will discontinue propranolol 60 mg TID and start patient on metoprolol-XL 25 mg daily in order to optimize GDMT -Start Lasix 20 mg once daily with potassium chloride  20 mEq -Ordered repeat echocardiogram to reassess LV today given previous in 2020 -BMET x 2 weeks   Coronary artery disease / Hyperlipidemia Coronary CTA 06/24/2019 with coronary calcium score 10 (64th percentile) Most recent LDL 64 on 10/2022, and under good control less than 70 Primarily managed by PCP -Today she is without any anginal symptoms, there is no indication for further ischemic evaluation at this time -Continue atorvastatin 20 mg daily   Hypertension Blood pressure today is under excellent control at 124/70 -Continue lisinopril 20 mg daily   Obstructive sleep apnea Not currently adherent to CPAP.  We spoke in great detail of positives and negatives of CPAP therapy as severe OSA that is left untreated could worsen HF symptoms.  She plans to begin back on CPAP therapy over the weekend   Anemia Hemoglobin of 10.5 on 10/2023 Recent fecal occult stool in ED was negative -She has appointment scheduled with hematology in 4/25            Dispo: She has prescheduled follow-up visit with Dr. Herbie Baltimore in 2 weeks for which she would like to keep.  I am okay with this, she will need to have echocardiogram completed prior to the visit.   Signed, Denyce Robert, NP   HPI   Prior to Admission medications   Medication Sig Start Date End Date Taking? Authorizing Provider  albuterol (PROAIR HFA) 108 (90 Base) MCG/ACT inhaler Inhale 1-2 puffs into the lungs every 4 (four) hours as needed for wheezing or shortness of  breath. 08/21/22   Freddy Finner, NP  atorvastatin (LIPITOR) 20 MG tablet Take 1 tablet (20 mg total) by mouth daily. 10/05/23   Georgina Quint, MD  benzonatate (TESSALON) 100 MG capsule Take 1 capsule (100 mg total) by mouth 2 (two) times daily as needed for cough. 08/22/23   Laure Kidney, PA-C  busPIRone (BUSPAR) 7.5 MG tablet Take 1 tablet (7.5 mg total) by mouth 2 (two) times daily. 10/05/23   Georgina Quint, MD  butalbital-acetaminophen-caffeine  [facility]) [phone] MG tablet Take 1 tablet by mouth every 6 (six) hours as needed for headache. 02/20/23   Corwin Levins, MD  cyclobenzaprine (FLEXERIL) 10 MG tablet Take 1 tablet (10 mg total) by mouth 3 (three) times daily as needed for muscle spasms. 09/03/23   Georgina Quint, MD  DULoxetine (CYMBALTA) 60 MG capsule Take 1 capsule (60 mg total) by mouth daily. 10/05/23   Georgina Quint, MD  fluticasone [facility]) 50 MCG/ACT nasal spray Place 1 spray into both nostrils daily. 02/19/23   Georgina Quint, MD  furosemide (LASIX) 20 MG tablet Take 1 tablet (20 mg total) by mouth 2 (two) times daily. 10/06/23   Gareth Eagle, PA-C  furosemide (LASIX) 20 MG tablet Take 1 tablet (20 mg total) by mouth daily. 10/16/23   Denyce Robert, NP  gabapentin (NEURONTIN) 300 MG capsule Take 1-2 capsules (300-600 mg total) by mouth 4 (four) times daily. 07/01/23   Georgina Quint, MD  HYDROcodone-acetaminophen (NORCO/VICODIN) 5-325 MG tablet Take 1 tablet by mouth every 4 (four) - 6(six) hours as needed for pain 09/03/23     lisinopril (ZESTRIL) 20 MG tablet Take 1 tablet (20 mg total) by mouth daily. 04/28/23   Georgina Quint, MD  metoprolol succinate (TOPROL XL) 25 MG 24 hr tablet Take 1 tablet (25 mg total) by mouth daily. 10/16/23   Denyce Robert, NP  ondansetron (ZOFRAN-ODT) 4 MG disintegrating tablet Dissolve 1 tablet (4 mg total) in mouth every 8 (eight) hours as needed for nausea or vomiting. 08/10/23   Georgina Quint, MD  pantoprazole (PROTONIX) 40 MG tablet Take 1 tablet (40 mg total) by mouth daily. 09/03/23   Georgina Quint, MD  potassium chloride SA (KLOR-CON M) 20 MEQ tablet Take 1 tablet (20 mEq total) by mouth 2 (two) times daily for 5 days. 10/06/23 10/11/23  Gareth Eagle, PA-C  potassium chloride SA (KLOR-CON M) 20 MEQ tablet Take 1 tablet (20 mEq total) by mouth daily. 10/16/23   Denyce Robert, NP  Semaglutide-Weight Management (WEGOVY) 2.4  MG/0.75ML SOAJ Inject 2.4 mg into the skin once a week. 05/05/22   Georgina Quint, MD  sodium fluoride (SODIUM FLUORIDE 5000 PPM) 1.1 % GEL dental gel Use before bed.  Do not rinse 04/27/23     topiramate (TOPAMAX) 50 MG tablet Take 1 tablet (50 mg total) by mouth 2 (two) times daily. 10/05/23   Georgina Quint, MD  vortioxetine HBr (TRINTELLIX) 10 MG TABS tablet Take 1 tablet (10 mg total) by mouth daily. 04/28/23   Georgina Quint, MD    Allergies  Allergen Reactions   Sulfa Antibiotics Itching    Patient Active Problem List   Diagnosis Date Noted   Symptomatic anemia 10/08/2023   Postviral fatigue syndrome 10/22/2022   Nausea without vomiting 03/26/2022   Prediabetes 11/18/2021   Major depression, recurrent, chronic (HCC) 10/01/2021   Hx of adenomatous colonic polyps 01/02/2021   Pulmonary arterial hypertension (  HCC) 07/04/2020   Kidney cysts 07/04/2020   Hyperlipidemia 07/04/2020   Depression 07/04/2020   Iron deficiency anemia 10/13/2018   DOE (dyspnea on exertion) 03/23/2018   Obesity, morbid, BMI 40.0-49.9 (HCC) 03/23/2018   Chronic diastolic heart failure (HCC) 03/23/2018   Grade I diastolic dysfunction 07/20/2017   Migraine without aura and without status migrainosus, not intractable 03/02/2017   Class 3 severe obesity due to excess calories with serious comorbidity and body mass index (BMI) of 40.0 to 44.9 in adult (HCC) 01/13/2017   Hiatal hernia 10/28/2016   Pure hypercholesterolemia 10/28/2016   Chronic kidney disease 06/01/2015   Gastroesophageal reflux disease 06/01/2015   Goiter 06/01/2015   Essential hypertension 06/01/2015   Chronic pain syndrome 05/28/2015   Fibromyalgia 05/28/2015   OSA (obstructive sleep apnea) 05/28/2015   Adjustment disorder with mixed anxiety and depressed mood 05/28/2015    Past Medical History:  Diagnosis Date   Allergy    [name], Flonase   Anemia    Anxiety    Arthritis    Chronic kidney disease    Colon  polyps    Depression    Fibromyalgia    Gastritis    GERD (gastroesophageal reflux disease)    Hernia, hiatal    High risk for colon cancer    HLD (hyperlipidemia)    Hypertension    Migraine    Neuropathy    Osteoporosis    Pneumonia    Sleep apnea    c-pap nightly   Sleep apnea with use of continuous positive airway pressure (CPAP)    Thyroid goiter     Past Surgical History:  Procedure Laterality Date   48-Hour Holter Monitor  06/2017   Mostly normal sinus rhythm.  Average heart rate 71 bpm.  Minimum 54 bpm.  Maximum sinus rate 114 bpm.  Rare PACs and PVCs.  No A. fib, atrial flutter, SVT or VT.  One brief run of 8 beat PAT.   ABDOMINAL HYSTERECTOMY     ovaries intact; DUB.  No dysplasia.   FRACTURE SURGERY N/A    [employer]reesia 09/25/2020   JOINT REPLACEMENT     OPEN REDUCTION INTERNAL FIXATION (ORIF) DISTAL RADIAL FRACTURE Right 07/09/2017   Procedure: RIGHT WRIST OPEN REDUCTION INTERNAL FIXATION (ORIF) DISTAL RADIAL FRACTURE AND REPAIR AS INDICATED;  Surgeon: Bradly Bienenstock, MD;  Location: [facility] OR;  Service: Orthopedics;  Laterality: Right;   PARTIAL HYSTERECTOMY     ROTATOR CUFF REPAIR Right 2009   SHOULDER SURGERY Right    shoulder dislocation, fall, and elbow unla nerve   SHOULDER SURGERY Right 2015   labral tear   TRANSTHORACIC ECHOCARDIOGRAM  06/2017    EF 55-60%.  grade 1 diastolic dysfunction.  Otherwise normal   TUBAL LIGATION     ULNAR NERVE REPAIR Right 08/19/2014    Social History   Socioeconomic History   Marital status: Married    Spouse name: Not on file   Number of children: 3   Years of education: Not on file   Highest education level: Not on file  Occupational History   Occupation: unemployed  Tobacco Use   Smoking status: Former    Current packs/day: 0.00    Types: Cigarettes    Quit date: 08/04/1998    Years since quitting: 25.2   Smokeless tobacco: Never  Vaping Use   Vaping status: Never Used  Substance and Sexual Activity   Alcohol use: No    Drug use: No   Sexual activity: Yes    Birth  control/protection: Surgical, Post-menopausal  Other Topics Concern   Not on file  Social History Narrative   Marital status: married x 20 years; moderately happy     Children: 3 children (47 Dimas Aguas, 51 Dianna, 52 April); 8 grandchildren; 0 gg     Lives: with husband/Steve, April, 2 granddaughters     Employment:  Unemployed; quit working 2015 with work related injury; awaiting disability in 2018; disability approved in 02/2017.       Tobacco: quit smoking in 2016; smoked x 2 years.      Alcohol: none      Drugs: none      Exercise: rarely in 2018         Social Drivers of Health   Financial Resource Strain: Low Risk  (03/31/2023)   Overall Financial Resource Strain (CARDIA)    Difficulty of Paying Living Expenses: Not hard at all  Food Insecurity: No Food Insecurity (03/31/2023)   Hunger Vital Sign    Worried About Running Out of Food in the Last Year: Never true    Ran Out of Food in the Last Year: Never true  Transportation Needs: No Transportation Needs (03/31/2023)   PRAPARE - Administrator, Civil Service (Medical): No    Lack of Transportation (Non-Medical): No  Physical Activity: Inactive (03/31/2023)   Exercise Vital Sign    Days of Exercise per Week: 0 days    Minutes of Exercise per Session: 0 min  Stress: No Stress Concern Present (03/31/2023)   [employer] of Occupational Health - Occupational Stress Questionnaire    Feeling of Stress : Not at all  Social Connections: Unknown (03/31/2023)   Social Connection and Isolation Panel [NHANES]    Frequency of Communication with Friends and Family: More than three times a week    Frequency of Social Gatherings with Friends and Family: More than three times a week    Attends Religious Services: Not on file    Active Member of Clubs or Organizations: Yes    Attends Banker Meetings: Not on file    Marital Status: Married  Intimate Partner Violence:  Not At Risk (03/31/2023)   Humiliation, Afraid, Rape, and Kick questionnaire    Fear of Current or Ex-Partner: No    Emotionally Abused: No    Physically Abused: No    Sexually Abused: No    Family History  Problem Relation Age of Onset   Heart disease Mother 77       AMI/CAD/CHF as cause of death   Hypertension Mother    Hyperlipidemia Mother    Hypothyroidism Mother    Depression Mother    Gout Mother    Heart disease Father 63       AMI   Hypertension Father    Stroke Father 30       mild CVA   Prostate cancer Father    Hyperlipidemia Father    Cancer Father 69       prostate cancer   Cancer Sister        Basal cell carcinoma scalp   Depression Sister    Arthritis Sister    Depression Sister    Cancer Maternal Grandmother        type unknown   Cancer Maternal Grandfather        type unknown   Heart disease Paternal Grandmother    Heart defect Paternal Grandfather    Hypertension Brother    Hyperlipidemia Brother    Cancer Brother  prostate cancer   Melanoma Brother    Hyperlipidemia Brother    Hypertension Brother    Cancer Brother        melanoma   Hypertension Brother    Hyperlipidemia Brother    Melanoma Brother    Cancer Brother 25       melanoma   Hypertension Brother    Depression Brother    Hypertension Brother    Gout Brother    Colon cancer Neg Hx    Esophageal cancer Neg Hx    Stomach cancer Neg Hx    Rectal cancer Neg Hx    Breast cancer Neg Hx      Review of Systems  Constitutional: Negative.  Negative for chills and fever.  HENT: Negative.  Negative for congestion and sore throat.   Respiratory: Negative.  Negative for cough and shortness of breath.   Cardiovascular: Negative.  Negative for chest pain and palpitations.  Gastrointestinal:  Negative for abdominal pain, diarrhea, nausea and vomiting.  Genitourinary: Negative.  Negative for dysuria and hematuria.  Musculoskeletal:  Positive for back pain.  Skin: Negative.   Negative for rash.  Neurological: Negative.  Negative for dizziness and headaches.  All other systems reviewed and are negative.   Today's Vitals   10/26/23 1255  BP: 126/82  Pulse: 72  Temp: 98.2 F (36.8 C)  TempSrc: Oral  SpO2: 94%  Weight: 208 lb (94.3 kg)  Height: 5\' 4"  (1.626 m)   Body mass index is 35.7 kg/m.   Physical Exam Vitals reviewed.  Constitutional:      Appearance: Normal appearance.  HENT:     Right Ear: Tympanic membrane, ear canal and external ear normal.     Left Ear: Tympanic membrane, ear canal and external ear normal.     Mouth/Throat:     Mouth: Mucous membranes are moist.     Pharynx: Oropharynx is clear.  Eyes:     Extraocular Movements: Extraocular movements intact.     Conjunctiva/sclera: Conjunctivae normal.     Pupils: Pupils are equal, round, and reactive to light.  Cardiovascular:     Rate and Rhythm: Normal rate and regular rhythm.     Pulses: Normal pulses.     Heart sounds: Normal heart sounds.  Pulmonary:     Effort: Pulmonary effort is normal.     Breath sounds: Normal breath sounds.  Abdominal:     Palpations: Abdomen is soft.     Tenderness: There is no abdominal tenderness.  Musculoskeletal:     Right lower leg: No edema.     Left lower leg: No edema.  Skin:    General: Skin is warm and dry.     Capillary Refill: Capillary refill takes less than 2 seconds.  Neurological:     General: No focal deficit present.     Mental Status: She is alert and oriented to person, place, and time.  Psychiatric:        Mood and Affect: Mood normal.        Behavior: Behavior normal.      ASSESSMENT & PLAN: Problem List Items Addressed This Visit       Cardiovascular and Mediastinum   Essential hypertension (Chronic)   Well-controlled hypertension Continue lisinopril 20 mg daily Propranolol recently discontinued and started on metoprolol succinate 25 mg daily Cardiovascular risk associated with hypertension discussed       Relevant Orders   Comprehensive metabolic panel   Lipid panel   Chronic diastolic heart failure (HCC) (  Chronic)   No signs of congestive heart failure Last echocardiogram report reviewed Taking furosemide 20 mg daily Most recent cardiologist office visit notes reviewed      Relevant Orders   TSH     Respiratory   OSA (obstructive sleep apnea)   Recently restarted CPAP treatment        Digestive   Gastroesophageal reflux disease   Stable and asymptomatic at present time Only takes Protonix 40 mg as needed        Other   Obesity, morbid, BMI 40.0-49.9 (HCC)   Started gaining weight back Diet and nutrition discussed Recommend to restart GLP-1 agonist as covered by insurance      Relevant Medications   Semaglutide-Weight Management (WEGOVY) 0.25 MG/0.5ML SOAJ   Other Relevant Orders   Comprehensive metabolic panel   TSH   Lipid panel   Hemoglobin A1c   Major depression, recurrent, chronic (HCC)   History of chronic depression.  Currently active and affecting quality of life. Current Cymbalta and Trintellix New psychiatric referral placed today.      Relevant Orders   Ambulatory referral to Psychology   Other Visit Diagnoses       Encounter for general adult medical examination with abnormal findings    -  Primary   Relevant Orders   Comprehensive metabolic panel   TSH   Lipid panel   Hemoglobin A1c     Screening for lipoid disorders       Relevant Orders   Lipid panel     Screening for endocrine, metabolic and immunity disorder       Relevant Orders   Comprehensive metabolic panel   TSH   Hemoglobin A1c      Modifiable risk factors discussed with patient. Anticipatory guidance according to age provided. The following topics were also discussed: Social Determinants of Health Smoking.  Non-smoker Diet and nutrition Benefits of exercise Cancer screening and review of most recent mammogram and colonoscopy reports Vaccinations reviewed and  recommendations Cardiovascular risk assessment The 10-year ASCVD risk score (Arnett DK, et al., 2019) is: 7.5%   Values used to calculate the score:     Age: 31 years     Sex: Female     Is Non-Hispanic African American: No     Diabetic: No     Tobacco smoker: No     Systolic Blood Pressure: 126 mmHg     Is BP treated: Yes     HDL Cholesterol: 62.6 mg/dL     Total Cholesterol: 148 mg/dL Review of multiple chronic medical conditions under management Review of all medications and changes made Mental health including depression and anxiety Fall and accident prevention  Patient Instructions  Health Maintenance, Female Adopting a healthy lifestyle and getting preventive care are important in promoting health and wellness. Ask your health care provider about: The right schedule for you to have regular tests and exams. Things you can do on your own to prevent diseases and keep yourself healthy. What should I know about diet, weight, and exercise? Eat a healthy diet  Eat a diet that includes plenty of vegetables, fruits, low-fat dairy products, and lean protein. Do not eat a lot of foods that are high in solid fats, added sugars, or sodium. Maintain a healthy weight Body mass index (BMI) is used to identify weight problems. It estimates body fat based on height and weight. Your health care provider can help determine your BMI and help you achieve or maintain a healthy weight. Get regular  exercise Get regular exercise. This is one of the most important things you can do for your health. Most adults should: Exercise for at least 150 minutes each week. The exercise should increase your heart rate and make you sweat (moderate-intensity exercise). Do strengthening exercises at least twice a week. This is in addition to the moderate-intensity exercise. Spend less time sitting. Even light physical activity can be beneficial. Watch cholesterol and blood lipids Have your blood tested for lipids  and cholesterol at 68 years of age, then have this test every 5 years. Have your cholesterol levels checked more often if: Your lipid or cholesterol levels are high. You are older than 68 years of age. You are at high risk for heart disease. What should I know about cancer screening? Depending on your health history and family history, you may need to have cancer screening at various ages. This may include screening for: Breast cancer. Cervical cancer. Colorectal cancer. Skin cancer. Lung cancer. What should I know about heart disease, diabetes, and high blood pressure? Blood pressure and heart disease High blood pressure causes heart disease and increases the risk of stroke. This is more likely to develop in people who have high blood pressure readings or are overweight. Have your blood pressure checked: Every 3-5 years if you are 79-68 years of age. Every year if you are 43 years old or older. Diabetes Have regular diabetes screenings. This checks your fasting blood sugar level. Have the screening done: Once every three years after age 52 if you are at a normal weight and have a low risk for diabetes. More often and at a younger age if you are overweight or have a high risk for diabetes. What should I know about preventing infection? Hepatitis B If you have a higher risk for hepatitis B, you should be screened for this virus. Talk with your health care provider to find out if you are at risk for hepatitis B infection. Hepatitis C Testing is recommended for: Everyone born from [birth year] through 1965. Anyone with known risk factors for hepatitis C. Sexually transmitted infections (STIs) Get screened for STIs, including gonorrhea and chlamydia, if: You are sexually active and are younger than 68 years of age. You are older than 68 years of age and your health care provider tells you that you are at risk for this type of infection. Your sexual activity has changed since you were last  screened, and you are at increased risk for chlamydia or gonorrhea. Ask your health care provider if you are at risk. Ask your health care provider about whether you are at high risk for HIV. Your health care provider may recommend a prescription medicine to help prevent HIV infection. If you choose to take medicine to prevent HIV, you should first get tested for HIV. You should then be tested every 3 months for as long as you are taking the medicine. Pregnancy If you are about to stop having your period (premenopausal) and you may become pregnant, seek counseling before you get pregnant. Take 400 to 800 micrograms (mcg) of folic acid every day if you become pregnant. Ask for birth control (contraception) if you want to prevent pregnancy. Osteoporosis and menopause Osteoporosis is a disease in which the bones lose minerals and strength with aging. This can result in bone fractures. If you are 29 years old or older, or if you are at risk for osteoporosis and fractures, ask your health care provider if you should: Be screened for bone loss. Take  a calcium or vitamin D supplement to lower your risk of fractures. Be given hormone replacement therapy (HRT) to treat symptoms of menopause. Follow these instructions at home: Alcohol use Do not drink alcohol if: Your health care provider tells you not to drink. You are pregnant, may be pregnant, or are planning to become pregnant. If you drink alcohol: Limit how much you have to: 0-1 drink a day. Know how much alcohol is in your drink. In the [place] one drink equals one 12 oz bottle of beer (355 mL), one 5 oz glass of wine (148 mL), or one 1 oz glass of hard liquor (44 mL). Lifestyle Do not use any products that contain nicotine or tobacco. These products include cigarettes, chewing tobacco, and vaping devices, such as e-cigarettes. If you need help quitting, ask your health care provider. Do not use street drugs. Do not share needles. Ask your health  care provider for help if you need support or information about quitting drugs. General instructions Schedule regular health, dental, and eye exams. Stay current with your vaccines. Tell your health care provider if: You often feel depressed. You have ever been abused or do not feel safe at home. Summary Adopting a healthy lifestyle and getting preventive care are important in promoting health and wellness. Follow your health care provider's instructions about healthy diet, exercising, and getting tested or screened for diseases. Follow your health care provider's instructions on monitoring your cholesterol and blood pressure. This information is not intended to replace advice given to you by your health care provider. Make sure you discuss any questions you have with your health care provider. Document Revised: 12/10/2020 Document Reviewed: 12/10/2020 Elsevier Patient Education  2024 Elsevier Inc.     Edwina Barth, MD [facility]

## 2023-10-26 NOTE — Assessment & Plan Note (Signed)
 Recently restarted CPAP treatment

## 2023-10-26 NOTE — Assessment & Plan Note (Signed)
 History of chronic depression.  Currently active and affecting quality of life. Current Cymbalta and Trintellix New psychiatric referral placed today.

## 2023-10-30 ENCOUNTER — Other Ambulatory Visit (HOSPITAL_COMMUNITY)

## 2023-11-03 ENCOUNTER — Ambulatory Visit: Payer: Commercial Managed Care - PPO | Attending: Cardiology | Admitting: Cardiology

## 2023-11-03 ENCOUNTER — Telehealth: Payer: Self-pay

## 2023-11-03 VITALS — BP 110/62 | HR 76 | Ht 64.0 in | Wt 211.0 lb

## 2023-11-03 DIAGNOSIS — E7800 Pure hypercholesterolemia, unspecified: Secondary | ICD-10-CM | POA: Diagnosis not present

## 2023-11-03 DIAGNOSIS — I10 Essential (primary) hypertension: Secondary | ICD-10-CM

## 2023-11-03 DIAGNOSIS — I5032 Chronic diastolic (congestive) heart failure: Secondary | ICD-10-CM | POA: Diagnosis not present

## 2023-11-03 DIAGNOSIS — E66813 Obesity, class 3: Secondary | ICD-10-CM | POA: Diagnosis not present

## 2023-11-03 DIAGNOSIS — I2721 Secondary pulmonary arterial hypertension: Secondary | ICD-10-CM | POA: Diagnosis not present

## 2023-11-03 DIAGNOSIS — R0609 Other forms of dyspnea: Secondary | ICD-10-CM

## 2023-11-03 DIAGNOSIS — G4733 Obstructive sleep apnea (adult) (pediatric): Secondary | ICD-10-CM | POA: Diagnosis not present

## 2023-11-03 DIAGNOSIS — Z6841 Body Mass Index (BMI) 40.0 and over, adult: Secondary | ICD-10-CM

## 2023-11-03 DIAGNOSIS — D649 Anemia, unspecified: Secondary | ICD-10-CM | POA: Diagnosis not present

## 2023-11-03 NOTE — Progress Notes (Signed)
 Cardiology Office Note:  .   Date:  11/07/2023  ID:  [name], DOB 1956-05-07, MRN [id number] PCP: Georgina Quint, MD  [facility] Providers Cardiologist:  Bryan Lemma, MD     Chief Complaint  Patient presents with   Follow-up    Shortness of breath on exertion.  Echo results   Originally seen for evaluation at the request of Georgina Quint, *.  Patient Profile: .     [name] is a moderately obese 68 y.o. female with a PMH notable for HTN, ?  Chronic HFpEF with DOE, OSA on CPAP, and chronic anemia who presents here for 1 month follow-up.    Coronary Calcium Score 06/07/2019: CAC score of 10 with proximal calcification in the RCA noted. ?  Mild dilation of the PA, and RV/RA Echo 10/05/2018: EF 60 to 55%.  Normal diastolic parameters?  With mild LA and RA dilation.  Normal RV size and function.  Normal PAP.    I last saw [name] January 2021 to follow-up after Echo and Coronary CTA results.  She was still noticing getting short of breath with the need to quickly and that maybe her oxygen saturation would drop during exercise.  She was using CPAP routinely.  No wheezing or coughing.  Was getting iron supplementation and iron infusions.  Anemia improving.  No PND orthopnea.  Mild balance instability.  No changes made. => She was seen in September 2022 by Judy Pimple, PA still stating that her exertional dyspnea had not changed.  Also describes an occasional atypical chest discomfort, lasting a minute.  Chronic dizziness, but no syncope or near syncope.  Still dealing with iron insufficiency.  Discussed weight loss as well as goals of care for blood pressure, lipid and glycemic control.  She was last seen on 10/16/2023 by Rise Paganini, NP for ER follow-up.  ER on 10/06/2023 with dyspnea and edema PND orthopnea treated with IV Lasix.  She had noted some weight gain and leg swelling.  Had noticed a gain of roughly 37 pounds since December despite no  change in diet.  Converted from TID propranolol to Toprol-XL 25 mg daily and started Lasix daily along with KCl supplementation.  Echo ordered.  Subjective  Discussed the use of AI scribe software for clinical note transcription with the patient, who gave verbal consent to proceed.  History of Present Illness History of Present Illness [name] is a 68 year old female with Hypertension and anemia who presents with shortness of breath and weight gain.  She just had an Echo checked. She is accompanied by her partner, [name].  She experiences worsening shortness of breath, particularly on exertion, which has not improved with the use of Lasix. The shortness of breath is absent at rest and does not disturb her sleep. No chest pain, pressure, or tightness during activities, but she does experience palpitations described as 'little pain pricks' and heart fluttering two to three times a day, not associated with exertion.  She has a history of significant weight changes. Previously on Wegovy and Ozempic, she lost 70 pounds over 14 months. However, since discontinuing these medications due to insurance issues, she has regained 37 pounds since November or December. She is currently unable to afford [facility] despite a coupon reducing the cost. She is not diabetic and thus does not qualify for certain insurance coverage for these medications. She tries to maintain a healthy diet, focusing on vegetables and lean meats.  She has a  history of anemia and is scheduled to see a hematologist tomorrow. She experiences fatigue and difficulty sleeping, sometimes staying awake for 24 to 36 hours. She uses a CPAP machine, although not consistently. Her partner, [name], reports that she does not snore.  Her current medications include Lasix 20 mg daily, Klor-Con 20 mg once daily, lisinopril 20 mg daily metoprolol succinate 25 mg daily, which was switched from propranolol. She takes Lasix once a day and sleeps on one pillow  to avoid neck pressure. The Lasix has not improved her breathing issues. She also takes atorvastatin 20 mg daily for lipid control.  Cardiovascular ROS: positive for - dyspnea on exertion, palpitations, rapid heart rate, shortness of breath, and an unusual period.  Trivial edema.  Unusual pinprick like chest pain. negative for - orthopnea, paroxysmal nocturnal dyspnea, or syncope or near syncope, TIA or amaurosis fugax or claudication    Objective    Studies Reviewed: Marland Kitchen   EKG Interpretation Date/Time:  Tuesday November 03 2023 13:57:40 EDT Ventricular Rate:  76 PR Interval:  146 QRS Duration:  86 QT Interval:  366 QTC Calculation: 411 R Axis:   -11  Text Interpretation: Normal sinus rhythm Normal ECG When compared with ECG of 06-Oct-2023 17:31, Questionable change in QRS duration Criteria for Anterior infarct are no longer Present Criteria for Anterolateral infarct are no longer Present Confirmed by Bryan Lemma ([id number]) on 11/03/2023 2:34:25 PM    ECHO 10/21/2023: EF 60 to 65%.  No RWMA.  GR 1 DD.  Moderate LA dilation.  Mild progression of diastolic function  Lab Results  Component Value Date   NA 141 10/26/2023   K 4.5 10/26/2023   CREATININE 1.02 10/26/2023   GFR 56.86 (L) 10/26/2023   GLUCOSE 97 10/26/2023   Lab Results  Component Value Date   CHOL 148 10/26/2023   HDL 62.60 10/26/2023   LDLCALC 71 10/26/2023   TRIG 76.0 10/26/2023   CHOLHDL 2 10/26/2023   Risk Assessment/Calculations:        Physical Exam:   VS:  BP 110/62 (BP Location: Left Arm, Patient Position: Sitting, Cuff Size: Normal)   Pulse 76   Ht 5\' 4"  (1.626 m)   Wt 211 lb (95.7 kg)   SpO2 95%   BMI 36.22 kg/m    Wt Readings from Last 3 Encounters:  11/04/23 212 lb 3.2 oz (96.3 kg)  11/03/23 211 lb (95.7 kg)  10/26/23 208 lb (94.3 kg)    GEN: Well nourished, well groomed in no acute distress; moderately obese. NECK: No JVD; No carotid bruits CARDIAC: Normal S1, S2; RRR, no murmurs, rubs,  gallops RESPIRATORY:  Clear to auscultation without rales, wheezing or rhonchi ; nonlabored, good air movement. ABDOMEN: Soft, non-tender, non-distended EXTREMITIES:  No edema; No deformity     ASSESSMENT AND PLAN: .    Problem List Items Addressed This Visit       Cardiology Problems   Chronic diastolic heart failure (HCC) (Chronic)   No active signs of CHF.  Euvolemic on exam.  Symptoms not improved with furosemide.  No PND or orthopnea.  Simply notes exertional dyspnea. Echocardiogram shows grade 1 diastolic dysfunction, likely due to long-standing hypertension-which is well-controlled on minimal medications suggesting that it may not be a major feature.   - Maintain current blood pressure management-only on standing dose of lisinopril 20 mg daily and Toprol 25 mg daily - Can continue furosemide 20 mg daily although it does not seem to have improved symptoms.  Relevant Orders   EKG 12-Lead (Completed)   Essential hypertension (Chronic)   Well-controlled on current dose of lisinopril 20 mg daily and Toprol 25 mg daily.      Relevant Orders   EKG 12-Lead (Completed)   Pulmonary arterial hypertension (HCC)   Suggestion of dilated PA on Coronary Calcium Score, but not confirmed by echo both in 2020 and 2025.      Pure hypercholesterolemia (Chronic)     Other   Class 3 severe obesity due to excess calories with serious comorbidity and body mass index (BMI) of 40.0 to 44.9 in adult [facility]) (Chronic)   Significant weight gain occurred after discontinuing Wegovy and Ozempic due to insurance issues, potentially contributing to exertional dyspnea and fatigue. Insurance coverage for these medications is challenging. - Discuss weight management options with primary care or endocrinology - Explore insurance coverage for [facility]      DOE (dyspnea on exertion) - Primary (Chronic)   Exertional Dyspnea is likely multifactorial, related to weight gain, anemia, and deconditioning rather  than cardiac issues. She experiences shortness of breath primarily during exertion, which has worsened recently. Lasix has not improved her breathing.  The echocardiogram shows grade 1 diastolic dysfunction, but there is no significant evidence of heart failure.  - Continue Lasix standing dose 20 mg daily along with metoprolol succinate 25 mg daily and lisinopril 20 mg daily. - Evaluate and treat anemia - Reassess symptoms post-anemia treatment - Consider CPX once the test is available       OSA (obstructive sleep apnea) (Chronic)   Continue stressed importance of maintaining stable use of CPAP.      Symptomatic anemia (Chronic)   Anemia may contribute to her fatigue and dyspnea. The cause is undetermined, requiring further evaluation. Addressing anemia is prioritized before other potential causes of symptoms. - Attend hematology appointment for further evaluation - Consider endoscopy and colonoscopy as recommended by hematology         Follow-Up: Return in about 6 months (around 05/04/2024) for Routine follow up with me, [employer].    Signed, Marykay Lex, MD, MS Bryan Lemma, M.D., M.S. Interventional Cardiologist  [facility]  Pager # [phone] Phone # [phone] [address] [place], [place] [address]

## 2023-11-03 NOTE — Progress Notes (Signed)
 [facility] CONSULT NOTE  Patient Care Team: Purcell Emil Schanz, MD as PCP - General (Internal Medicine) Anner Alm ORN, MD as PCP - Cardiology (Cardiology) Magrinat, Sandria BROCKS, MD (Inactive) as Consulting Physician (Hematology and Oncology) Brenna Adine CROME, DO as Consulting Physician (Pulmonary Disease) Teressa Toribio SQUIBB, MD (Inactive) as Attending Physician (Gastroenterology)  CHIEF COMPLAINTS/PURPOSE OF CONSULTATION:  IDA Assessment & Plan Iron  Deficiency Anemia Chronic iron  deficiency anemia with unclear etiology. Previous colonoscopy and endoscopy negative for bleeding. Hiatal hernia may contribute to malabsorption. Asymptomatic except for chronic shortness of breath. -We have discussed most common causes of IDA today including blood loss, malabsorption and hemolysis. - Schedule iron  infusion with Venofer  (iron  sucrose) 300 mg times 3. - She had ferraheme in the past and tolerated it well. - Discuss with gastroenterologist the possibility of a capsule endoscopy to evaluate the small bowel for bleeding.  Hiatal Hernia Hiatal hernia may contribute to malabsorption of iron .  Congestive Heart Failure Congestive heart failure with mild diastolic dysfunction. Shortness of breath may be related to heart failure.  Hypertension Hypertension managed with medications.  Goiter Goiter with recent changes in palpability and voice changes. - Discuss with primary care provider as okabbed,  Follow-up Plans to monitor condition and response to treatment. - Schedule follow-up appointment in six months to assess response to iron  infusion and check hemoglobin levels. - Coordinate with gastroenterologist for colonoscopy and potential capsule endoscopy.  HISTORY OF PRESENTING ILLNESS:  [name] 68 y.o. female is here because of IDA  Discussed the use of AI scribe software for clinical note transcription with the patient, who gave verbal consent to proceed.  History  of Present Illness  [name] is a 68 year old female with iron  deficiency anemia who presents for evaluation of anemia.  She has a history of iron  deficiency anemia treated with iron  infusions approximately four to five years ago. Previous investigations, including a colonoscopy and endoscopy in 2020, did not reveal a definitive cause for her recurrent iron  deficiency. A polyp was found and removed during the colonoscopy, and she undergoes surveillance colonoscopies every three years due to this finding. She is scheduled for another colonoscopy this year.  She is asymptomatic from anemia, with no cravings for ice or significant fatigue. However, she experiences chronic shortness of breath, which she attributes to her anemia, although it has been a longstanding issue. Her hemoglobin levels were normal until recent lab work indicated a new onset of iron  deficiency. No visible blood in her stool.  Her dietary habits include consuming meat, though not daily, and she does not follow a vegetarian diet. She is not on any medications known to interfere with iron  absorption, such as Protonix  or Omidria. Her past medical history includes a hiatal hernia, which may contribute to malabsorption of iron .  She has a history of congestive heart failure and hypertension, for which she sees a cardiologist. An echocardiogram recently showed mild diastolic dysfunction but was otherwise unremarkable. She also has a history of a partial thyroidectomy in 1978 and reports a goiter, though she does not feel it currently and has noticed some voice changes.  She is retired, having stopped working ten years ago due to a work-related injury affecting her shoulder and elbow. She is insured through her husband's plan with [employer] and has Medicare coverage. She has no known allergies except for a past reaction to Bactrim in her early twenties.  All other systems were reviewed with the patient and are  negative.  MEDICAL  HISTORY:  Past Medical History:  Diagnosis Date   Allergy    [name], Flonase    Anemia    Anxiety    Arthritis    Chronic kidney disease    Colon polyps    Depression    Fibromyalgia    Gastritis    GERD (gastroesophageal reflux disease)    Hernia, hiatal    High risk for colon cancer    HLD (hyperlipidemia)    Hypertension    Migraine    Neuropathy    Osteoporosis    Pneumonia    Sleep apnea    c-pap nightly   Sleep apnea with use of continuous positive airway pressure (CPAP)    Thyroid  goiter     SURGICAL HISTORY: Past Surgical History:  Procedure Laterality Date   48-Hour Holter Monitor  06/2017   Mostly normal sinus rhythm.  Average heart rate 71 bpm.  Minimum 54 bpm.  Maximum sinus rate 114 bpm.  Rare PACs and PVCs.  No A. fib, atrial flutter, SVT or VT.  One brief run of 8 beat PAT.   ABDOMINAL HYSTERECTOMY     ovaries intact; DUB.  No dysplasia.   FRACTURE SURGERY N/A    Phreesia 09/25/2020   JOINT REPLACEMENT     OPEN REDUCTION INTERNAL FIXATION (ORIF) DISTAL RADIAL FRACTURE Right 07/09/2017   Procedure: RIGHT WRIST OPEN REDUCTION INTERNAL FIXATION (ORIF) DISTAL RADIAL FRACTURE AND REPAIR AS INDICATED;  Surgeon: Shari Easter, MD;  Location: [facility] OR;  Service: Orthopedics;  Laterality: Right;   PARTIAL HYSTERECTOMY     ROTATOR CUFF REPAIR Right 2009   SHOULDER SURGERY Right    shoulder dislocation, fall, and elbow unla nerve   SHOULDER SURGERY Right 2015   labral tear   TRANSTHORACIC ECHOCARDIOGRAM  06/2017    EF 55-60%.  grade 1 diastolic dysfunction.  Otherwise normal   TUBAL LIGATION     ULNAR NERVE REPAIR Right 08/19/2014    SOCIAL HISTORY: Social History   Socioeconomic History   Marital status: Married    Spouse name: Not on file   Number of children: 3   Years of education: Not on file   Highest education level: Not on file  Occupational History   Occupation: unemployed  Tobacco Use   Smoking status: Former    Current packs/day: 0.00     Types: Cigarettes    Quit date: 08/04/1998    Years since quitting: 25.2   Smokeless tobacco: Never  Vaping Use   Vaping status: Never Used  Substance and Sexual Activity   Alcohol use: No   Drug use: No   Sexual activity: Yes    Birth control/protection: Surgical, Post-menopausal  Other Topics Concern   Not on file  Social History Narrative   Marital status: married x 20 years; moderately happy     Children: 3 children (47 Kayla, 70 Dianna, 40 April); 8 grandchildren; 0 gg     Lives: with husband/Steve, April, 2 granddaughters     Employment:  Unemployed; quit working 2015 with work related injury; awaiting disability in 2018; disability approved in 02/2017.       Tobacco: quit smoking in 2016; smoked x 2 years.      Alcohol: none      Drugs: none      Exercise: rarely in 2018         Social Drivers of Health   Financial Resource Strain: Low Risk  (03/31/2023)   Overall Financial Resource Strain (CARDIA)  Difficulty of Paying Living Expenses: Not hard at all  Food Insecurity: No Food Insecurity (03/31/2023)   Hunger Vital Sign    Worried About Running Out of Food in the Last Year: Never true    Ran Out of Food in the Last Year: Never true  Transportation Needs: No Transportation Needs (03/31/2023)   PRAPARE - Administrator, Civil Service (Medical): No    Lack of Transportation (Non-Medical): No  Physical Activity: Inactive (03/31/2023)   Exercise Vital Sign    Days of Exercise per Week: 0 days    Minutes of Exercise per Session: 0 min  Stress: No Stress Concern Present (03/31/2023)   [employer] of Occupational Health - Occupational Stress Questionnaire    Feeling of Stress : Not at all  Social Connections: Unknown (03/31/2023)   Social Connection and Isolation Panel [NHANES]    Frequency of Communication with Friends and Family: More than three times a week    Frequency of Social Gatherings with Friends and Family: More than three times a week    Attends  Religious Services: Not on file    Active Member of Clubs or Organizations: Yes    Attends Banker Meetings: Not on file    Marital Status: Married  Intimate Partner Violence: Not At Risk (03/31/2023)   Humiliation, Afraid, Rape, and Kick questionnaire    Fear of Current or Ex-Partner: No    Emotionally Abused: No    Physically Abused: No    Sexually Abused: No    FAMILY HISTORY: Family History  Problem Relation Age of Onset   Heart disease Mother 43       AMI/CAD/CHF as cause of death   Hypertension Mother    Hyperlipidemia Mother    Hypothyroidism Mother    Depression Mother    Gout Mother    Heart disease Father 72       AMI   Hypertension Father    Stroke Father 18       mild CVA   Prostate cancer Father    Hyperlipidemia Father    Cancer Father 2       prostate cancer   Cancer Sister        Basal cell carcinoma scalp   Depression Sister    Arthritis Sister    Depression Sister    Cancer Maternal Grandmother        type unknown   Cancer Maternal Grandfather        type unknown   Heart disease Paternal Grandmother    Heart defect Paternal Grandfather    Hypertension Brother    Hyperlipidemia Brother    Cancer Brother        prostate cancer   Melanoma Brother    Hyperlipidemia Brother    Hypertension Brother    Cancer Brother        melanoma   Hypertension Brother    Hyperlipidemia Brother    Melanoma Brother    Cancer Brother 25       melanoma   Hypertension Brother    Depression Brother    Hypertension Brother    Gout Brother    Colon cancer Neg Hx    Esophageal cancer Neg Hx    Stomach cancer Neg Hx    Rectal cancer Neg Hx    Breast cancer Neg Hx     ALLERGIES:  is allergic to sulfa antibiotics.  MEDICATIONS:  Current Outpatient Medications  Medication Sig Dispense Refill   albuterol  (PROAIR   HFA) 108 (90 Base) MCG/ACT inhaler Inhale 1-2 puffs into the lungs every 4 (four) hours as needed for wheezing or shortness of breath. 18  g 0   atorvastatin  (LIPITOR) 20 MG tablet Take 1 tablet (20 mg total) by mouth daily. 90 tablet 3   benzonatate  (TESSALON ) 100 MG capsule Take 1 capsule (100 mg total) by mouth 2 (two) times daily as needed for cough. 20 capsule 0   busPIRone  (BUSPAR ) 7.5 MG tablet Take 1 tablet (7.5 mg total) by mouth 2 (two) times daily. 180 tablet 1   butalbital -acetaminophen -caffeine  (FIORICET ) 50-325-40 MG tablet Take 1 tablet by mouth every 6 (six) hours as needed for headache. 20 tablet 0   cyclobenzaprine  (FLEXERIL ) 10 MG tablet Take 1 tablet (10 mg total) by mouth 3 (three) times daily as needed for muscle spasms. 180 tablet 1   DULoxetine  (CYMBALTA ) 60 MG capsule Take 1 capsule (60 mg total) by mouth daily. 90 capsule 1   fluticasone  (FLONASE ) 50 MCG/ACT nasal spray Place 1 spray into both nostrils daily. 16 g 6   furosemide  (LASIX ) 20 MG tablet Take 1 tablet (20 mg total) by mouth daily. 90 tablet 1   gabapentin  (NEURONTIN ) 300 MG capsule Take 1-2 capsules (300-600 mg total) by mouth 4 (four) times daily. 540 capsule 1   HYDROcodone -acetaminophen  (NORCO/VICODIN) 5-325 MG tablet Take 1 tablet by mouth every 4 (four) - 6(six) hours as needed for pain (Patient not taking: Reported on 11/03/2023) 12 tablet 0   lisinopril  (ZESTRIL ) 20 MG tablet Take 1 tablet (20 mg total) by mouth daily. 90 tablet 1   metoprolol  succinate (TOPROL  XL) 25 MG 24 hr tablet Take 1 tablet (25 mg total) by mouth daily. 90 tablet 1   ondansetron  (ZOFRAN -ODT) 4 MG disintegrating tablet Dissolve 1 tablet (4 mg total) in mouth every 8 (eight) hours as needed for nausea or vomiting. 20 tablet 4   pantoprazole  (PROTONIX ) 40 MG tablet Take 1 tablet (40 mg total) by mouth daily. 90 tablet 0   potassium chloride  SA (KLOR-CON  M) 20 MEQ tablet Take 1 tablet (20 mEq total) by mouth daily. 90 tablet 1   Semaglutide -Weight Management (WEGOVY ) 0.25 MG/0.5ML SOAJ Inject 0.25 mg into the skin once a week. 2 mL 3   sodium fluoride  (SODIUM FLUORIDE  5000  PPM) 1.1 % GEL dental gel Use before bed.  Do not rinse 100 mL 3   topiramate  (TOPAMAX ) 50 MG tablet Take 1 tablet (50 mg total) by mouth 2 (two) times daily. 180 tablet 1   vortioxetine  HBr (TRINTELLIX ) 10 MG TABS tablet Take 1 tablet (10 mg total) by mouth daily. 90 tablet 1   zolpidem  (AMBIEN ) 5 MG tablet Take 1 tablet (5 mg total) by mouth at bedtime as needed for sleep. 15 tablet 1   No current facility-administered medications for this visit.     PHYSICAL EXAMINATION: ECOG PERFORMANCE STATUS: 0 - Asymptomatic  Vitals:   11/04/23 1347  BP: 129/61  Pulse: 85  Resp: 16  Temp: 98.4 F (36.9 C)  SpO2: 100%   Filed Weights   11/04/23 1347  Weight: 212 lb 3.2 oz (96.3 kg)    GENERAL:alert, no distress and comfortable SKIN: skin color, texture, turgor are normal, no rashes or significant lesions EYES: normal, conjunctiva are pink and non-injected, sclera clear OROPHARYNX:no exudate, no erythema and lips, buccal mucosa, and tongue normal  NECK: supple, thyroid  normal size, non-tender, without nodularity LYMPH:  no palpable lymphadenopathy in the cervical, axillary LUNGS: clear to auscultation  and percussion with normal breathing effort HEART: regular rate & rhythm and no murmurs and no lower extremity edema ABDOMEN:abdomen soft, non-tender and normal bowel sounds Musculoskeletal:no cyanosis of digits and no clubbing  PSYCH: alert & oriented x 3 with fluent speech NEURO: no focal motor/sensory deficits  LABORATORY DATA:  I have reviewed the data as listed Lab Results  Component Value Date   WBC 4.9 10/08/2023   HGB 10.5 (L) 10/08/2023   HCT 33.8 (L) 10/08/2023   MCV 80.0 10/08/2023   PLT 280.0 10/08/2023     Chemistry      Component Value Date/Time   NA 141 10/26/2023 1404   NA 139 09/26/2020 1427   K 4.5 10/26/2023 1404   CL 110 10/26/2023 1404   CO2 25 10/26/2023 1404   BUN 18 10/26/2023 1404   BUN 10 09/26/2020 1427   CREATININE 1.02 10/26/2023 1404    CREATININE 1.01 (H) 05/21/2016 1219      Component Value Date/Time   CALCIUM  9.0 10/26/2023 1404   ALKPHOS 61 10/26/2023 1404   AST 14 10/26/2023 1404   ALT 11 10/26/2023 1404   BILITOT 0.3 10/26/2023 1404   BILITOT <0.2 09/26/2020 1427       RADIOGRAPHIC STUDIES: I have personally reviewed the radiological images as listed and agreed with the findings in the report. ECHOCARDIOGRAM COMPLETE Result Date: 10/20/2023    ECHOCARDIOGRAM REPORT   Patient Name:   SHARIA AVERITT Date of Exam: 10/20/2023 Medical Rec #:  [id number]       Height:       64.0 in Accession #:    [id number]      Weight:       207.8 lb Date of Birth:  October 25, 1955       BSA:          1.988 m Patient Age:    67 years        BP:           124/70 mmHg Patient Gender: F               HR:           84 bpm. Exam Location:  [address] Procedure: 2D Echo, 3D Echo, Cardiac Doppler and Color Doppler (Both Spectral            and Color Flow Doppler were utilized during procedure). Indications:    I50.32 Chronic diatolic heart failure  History:        Patient has prior history of Echocardiogram examinations, most                 recent 10/05/2018. Chronic diastolic heart failure, CAD,                 Signs/Symptoms:Dyspnea and Edema; Risk Factors:Dyslipidemia,                 Hypertension and Sleep Apnea. Previous echo revealed LVEF 65%                 mild biatrial enlargment.  Sonographer:    Nolon Berg [facility], RDCS Referring Phys: [phone] [facility] IMPRESSIONS  1. Left ventricular ejection fraction, by estimation, is 60 to 65%. Left ventricular ejection fraction by 3D volume is 64 %. The left ventricle has normal function. The left ventricle has no regional wall motion abnormalities. Left ventricular diastolic  parameters are consistent with Grade I diastolic dysfunction (impaired relaxation).  2. Right ventricular systolic function is normal. The right  ventricular size is normal. Tricuspid regurgitation signal is inadequate for  assessing PA pressure.  3. Left atrial size was moderately dilated.  4. The mitral valve is normal in structure. No evidence of mitral valve regurgitation.  5. The aortic valve is tricuspid. Aortic valve regurgitation is not visualized. No aortic stenosis is present.  6. The inferior vena cava is normal in size with greater than 50% respiratory variability, suggesting right atrial pressure of 3 mmHg. Comparison(s): Prior images reviewed side by side. The left ventricular diastolic function is worse. FINDINGS  Left Ventricle: Left ventricular ejection fraction, by estimation, is 60 to 65%. Left ventricular ejection fraction by 3D volume is 64 %. The left ventricle has normal function. The left ventricle has no regional wall motion abnormalities. Global longitudinal strain performed but not reported based on interpreter judgement due to suboptimal tracking. The left ventricular internal cavity size was normal in size. There is no left ventricular hypertrophy. Left ventricular diastolic parameters are consistent with Grade I diastolic dysfunction (impaired relaxation). Indeterminate filling pressures. Right Ventricle: The right ventricular size is normal. No increase in right ventricular wall thickness. Right ventricular systolic function is normal. Tricuspid regurgitation signal is inadequate for assessing PA pressure. The tricuspid regurgitant velocity is 2.37 m/s, and with an assumed right atrial pressure of 3 mmHg, the estimated right ventricular systolic pressure is 25.5 mmHg. Left Atrium: Left atrial size was moderately dilated. Right Atrium: Right atrial size was normal in size. Pericardium: Trivial pericardial effusion is present. Mitral Valve: The mitral valve is normal in structure. No evidence of mitral valve regurgitation. Tricuspid Valve: The tricuspid valve is normal in structure. Tricuspid valve regurgitation is trivial. Aortic Valve: The aortic valve is tricuspid. Aortic valve regurgitation is not  visualized. No aortic stenosis is present. Pulmonic Valve: The pulmonic valve was grossly normal. Pulmonic valve regurgitation is mild. No evidence of pulmonic stenosis. Aorta: The aortic root and ascending aorta are structurally normal, with no evidence of dilitation. Venous: The inferior vena cava is normal in size with greater than 50% respiratory variability, suggesting right atrial pressure of 3 mmHg. IAS/Shunts: No atrial level shunt detected by color flow Doppler. Additional Comments: 3D was performed not requiring image post processing on an independent workstation and was normal.  LEFT VENTRICLE PLAX 2D LVIDd:         4.57 cm         Diastology LVIDs:         2.35 cm         LV e' medial:    4.35 cm/s LV PW:         0.93 cm         LV E/e' medial:  14.4 LV IVS:        0.93 cm         LV e' lateral:   5.44 cm/s LVOT diam:     2.20 cm         LV E/e' lateral: 11.5 LV SV:         73 LV SV Index:   37 LVOT Area:     3.80 cm        3D Volume EF                                LV 3D EF:    Left  ventricul                                             ar                                             ejection                                             fraction                                             by 3D                                             volume is                                             64 %.                                 3D Volume EF:                                3D EF:        64 %                                LV EDV:       113 ml                                LV ESV:       40 ml                                LV SV:        72 ml RIGHT VENTRICLE             IVC RV Basal diam:  3.16 cm     IVC diam: 1.12 cm RV Mid diam:    2.46 cm RV S prime:     16.50 cm/s TAPSE (M-mode): 2.4 cm LEFT ATRIUM           Index        RIGHT ATRIUM           Index LA diam:      4.30 cm 2.16 cm/m   RA Area:     22.28 cm LA Vol (A2C): 89.0 ml 44.76 ml/m  RA Volume:    70.10 ml  23.84 ml/m LA Vol (A4C):  91.3 ml 45.92 ml/m  AORTIC VALVE LVOT Vmax:   101.95 cm/s LVOT Vmean:  67.100 cm/s LVOT VTI:    0.193 m  AORTA Ao Root diam: 3.60 cm Ao Asc diam:  3.40 cm MV E velocity: 62.60 cm/s  TRICUSPID VALVE MV A velocity: 79.65 cm/s  TR Peak grad:   22.5 mmHg MV E/A ratio:  0.79        TR Vmax:        237.00 cm/s                             SHUNTS                            Systemic VTI:  0.19 m                            Systemic Diam: 2.20 cm Jerel Balding MD Electronically signed by Jerel Balding MD Signature Date/Time: 10/20/2023/2:05:01 PM    Final    DG Chest 2 View Result Date: 10/06/2023 CLINICAL DATA:  Short of breath EXAM: CHEST - 2 VIEW COMPARISON:  03/26/2022 FINDINGS: The heart size and mediastinal contours are within normal limits. Both lungs are clear. The visualized skeletal structures are unremarkable. Moderate hiatal hernia IMPRESSION: No active cardiopulmonary disease.  Hiatal hernia Electronically Signed   By: Luke Bun M.D.   On: 10/06/2023 20:16    All questions were answered. The patient knows to call the clinic with any problems, questions or concerns. I spent 30 minutes in the care of this patient including H and P, review of records, counseling and coordination of care.     Amber Stalls, MD 11/04/2023 2:22 PM

## 2023-11-03 NOTE — Patient Instructions (Signed)
 Medication Instructions:   No changes *If you need a refill on your cardiac medications before your next appointment, please call your pharmacy*   Lab Work: Not needed    Testing/Procedures:  Not needed  Follow-Up: At [facility], you and your health needs are our priority.  As part of our continuing mission to provide you with exceptional heart care, we have created designated Provider Care Teams.  These Care Teams include your primary Cardiologist (physician) and Advanced Practice Providers (APPs -  Physician Assistants and Nurse Practitioners) who all work together to provide you with the care you need, when you need it.     Your next appointment:   6 month(s)  The format for your next appointment:   In Person  Provider:   Bryan Lemma, MD    Other Instructions   s

## 2023-11-03 NOTE — Telephone Encounter (Signed)
 Left message on voicemail for patient about appointment on 11/04/23

## 2023-11-04 ENCOUNTER — Inpatient Hospital Stay: Attending: Hematology and Oncology | Admitting: Hematology and Oncology

## 2023-11-04 ENCOUNTER — Inpatient Hospital Stay

## 2023-11-04 VITALS — BP 129/61 | HR 85 | Temp 98.4°F | Resp 16 | Wt 212.2 lb

## 2023-11-04 DIAGNOSIS — I130 Hypertensive heart and chronic kidney disease with heart failure and stage 1 through stage 4 chronic kidney disease, or unspecified chronic kidney disease: Secondary | ICD-10-CM | POA: Insufficient documentation

## 2023-11-04 DIAGNOSIS — Z79899 Other long term (current) drug therapy: Secondary | ICD-10-CM | POA: Diagnosis not present

## 2023-11-04 DIAGNOSIS — N189 Chronic kidney disease, unspecified: Secondary | ICD-10-CM | POA: Diagnosis not present

## 2023-11-04 DIAGNOSIS — D509 Iron deficiency anemia, unspecified: Secondary | ICD-10-CM | POA: Diagnosis not present

## 2023-11-04 DIAGNOSIS — E049 Nontoxic goiter, unspecified: Secondary | ICD-10-CM | POA: Insufficient documentation

## 2023-11-04 DIAGNOSIS — K449 Diaphragmatic hernia without obstruction or gangrene: Secondary | ICD-10-CM | POA: Insufficient documentation

## 2023-11-04 DIAGNOSIS — I5030 Unspecified diastolic (congestive) heart failure: Secondary | ICD-10-CM | POA: Insufficient documentation

## 2023-11-07 ENCOUNTER — Encounter: Payer: Self-pay | Admitting: Cardiology

## 2023-11-07 NOTE — Assessment & Plan Note (Signed)
 Anemia may contribute to her fatigue and dyspnea. The cause is undetermined, requiring further evaluation. Addressing anemia is prioritized before other potential causes of symptoms. - Attend hematology appointment for further evaluation - Consider endoscopy and colonoscopy as recommended by hematology

## 2023-11-07 NOTE — Assessment & Plan Note (Signed)
 Well-controlled on current dose of lisinopril 20 mg daily and Toprol 25 mg daily.

## 2023-11-07 NOTE — Assessment & Plan Note (Signed)
 Suggestion of dilated PA on Coronary Calcium Score, but not confirmed by echo both in 2020 and 2025.

## 2023-11-07 NOTE — Assessment & Plan Note (Signed)
 No active signs of CHF.  Euvolemic on exam.  Symptoms not improved with furosemide.  No PND or orthopnea.  Simply notes exertional dyspnea. Echocardiogram shows grade 1 diastolic dysfunction, likely due to long-standing hypertension-which is well-controlled on minimal medications suggesting that it may not be a major feature.   - Maintain current blood pressure management-only on standing dose of lisinopril 20 mg daily and Toprol 25 mg daily - Can continue furosemide 20 mg daily although it does not seem to have improved symptoms.

## 2023-11-07 NOTE — Assessment & Plan Note (Signed)
 Continue stressed importance of maintaining stable use of CPAP.

## 2023-11-07 NOTE — Assessment & Plan Note (Signed)
 Significant weight gain occurred after discontinuing Wegovy and Ozempic due to insurance issues, potentially contributing to exertional dyspnea and fatigue. Insurance coverage for these medications is challenging. - Discuss weight management options with primary care or endocrinology - Explore insurance coverage for [facility]

## 2023-11-07 NOTE — Assessment & Plan Note (Signed)
 Exertional Dyspnea is likely multifactorial, related to weight gain, anemia, and deconditioning rather than cardiac issues. She experiences shortness of breath primarily during exertion, which has worsened recently. Lasix has not improved her breathing.  The echocardiogram shows grade 1 diastolic dysfunction, but there is no significant evidence of heart failure.  - Continue Lasix standing dose 20 mg daily along with metoprolol succinate 25 mg daily and lisinopril 20 mg daily. - Evaluate and treat anemia - Reassess symptoms post-anemia treatment - Consider CPX once the test is available

## 2023-11-12 ENCOUNTER — Other Ambulatory Visit (HOSPITAL_COMMUNITY): Payer: Self-pay

## 2023-11-12 ENCOUNTER — Other Ambulatory Visit: Payer: Self-pay | Admitting: Emergency Medicine

## 2023-11-12 ENCOUNTER — Other Ambulatory Visit: Payer: Self-pay

## 2023-11-12 DIAGNOSIS — F339 Major depressive disorder, recurrent, unspecified: Secondary | ICD-10-CM

## 2023-11-12 DIAGNOSIS — I10 Essential (primary) hypertension: Secondary | ICD-10-CM

## 2023-11-12 MED ORDER — VORTIOXETINE HBR 10 MG PO TABS
10.0000 mg | ORAL_TABLET | Freq: Every day | ORAL | 1 refills | Status: DC
  Filled 2023-11-12: qty 90, 90d supply, fill #0
  Filled 2024-02-10: qty 90, 90d supply, fill #1

## 2023-11-12 MED ORDER — LISINOPRIL 20 MG PO TABS
20.0000 mg | ORAL_TABLET | Freq: Every day | ORAL | 1 refills | Status: DC
  Filled 2023-11-12: qty 90, 90d supply, fill #0
  Filled 2024-02-26: qty 90, 90d supply, fill #1

## 2023-11-16 ENCOUNTER — Inpatient Hospital Stay

## 2023-11-16 VITALS — BP 122/73 | HR 66 | Temp 97.7°F | Resp 16

## 2023-11-16 DIAGNOSIS — E049 Nontoxic goiter, unspecified: Secondary | ICD-10-CM | POA: Diagnosis not present

## 2023-11-16 DIAGNOSIS — I130 Hypertensive heart and chronic kidney disease with heart failure and stage 1 through stage 4 chronic kidney disease, or unspecified chronic kidney disease: Secondary | ICD-10-CM | POA: Diagnosis not present

## 2023-11-16 DIAGNOSIS — I5030 Unspecified diastolic (congestive) heart failure: Secondary | ICD-10-CM | POA: Diagnosis not present

## 2023-11-16 DIAGNOSIS — K449 Diaphragmatic hernia without obstruction or gangrene: Secondary | ICD-10-CM | POA: Diagnosis not present

## 2023-11-16 DIAGNOSIS — Z79899 Other long term (current) drug therapy: Secondary | ICD-10-CM | POA: Diagnosis not present

## 2023-11-16 DIAGNOSIS — N189 Chronic kidney disease, unspecified: Secondary | ICD-10-CM | POA: Diagnosis not present

## 2023-11-16 DIAGNOSIS — D508 Other iron deficiency anemias: Secondary | ICD-10-CM

## 2023-11-16 DIAGNOSIS — D509 Iron deficiency anemia, unspecified: Secondary | ICD-10-CM | POA: Diagnosis not present

## 2023-11-16 MED ORDER — SODIUM CHLORIDE 0.9 % IV SOLN: INTRAVENOUS | Status: DC

## 2023-11-16 MED ORDER — SODIUM CHLORIDE 0.9 % IV SOLN
300.0000 mg | Freq: Once | INTRAVENOUS | Status: AC
  Administered 2023-11-16: 300 mg via INTRAVENOUS
  Filled 2023-11-16: qty 300

## 2023-11-16 NOTE — Patient Instructions (Signed)

## 2023-11-19 ENCOUNTER — Inpatient Hospital Stay

## 2023-11-19 VITALS — BP 127/74 | HR 68 | Temp 98.4°F | Resp 18 | Wt 212.0 lb

## 2023-11-19 DIAGNOSIS — K449 Diaphragmatic hernia without obstruction or gangrene: Secondary | ICD-10-CM | POA: Diagnosis not present

## 2023-11-19 DIAGNOSIS — N189 Chronic kidney disease, unspecified: Secondary | ICD-10-CM | POA: Diagnosis not present

## 2023-11-19 DIAGNOSIS — D508 Other iron deficiency anemias: Secondary | ICD-10-CM

## 2023-11-19 DIAGNOSIS — I130 Hypertensive heart and chronic kidney disease with heart failure and stage 1 through stage 4 chronic kidney disease, or unspecified chronic kidney disease: Secondary | ICD-10-CM | POA: Diagnosis not present

## 2023-11-19 DIAGNOSIS — Z79899 Other long term (current) drug therapy: Secondary | ICD-10-CM | POA: Diagnosis not present

## 2023-11-19 DIAGNOSIS — E049 Nontoxic goiter, unspecified: Secondary | ICD-10-CM | POA: Diagnosis not present

## 2023-11-19 DIAGNOSIS — D509 Iron deficiency anemia, unspecified: Secondary | ICD-10-CM | POA: Diagnosis not present

## 2023-11-19 DIAGNOSIS — I5030 Unspecified diastolic (congestive) heart failure: Secondary | ICD-10-CM | POA: Diagnosis not present

## 2023-11-19 MED ORDER — SODIUM CHLORIDE 0.9 % IV SOLN: INTRAVENOUS | Status: DC

## 2023-11-19 MED ORDER — SODIUM CHLORIDE 0.9 % IV SOLN
300.0000 mg | Freq: Once | INTRAVENOUS | Status: AC
  Administered 2023-11-19: 300 mg via INTRAVENOUS
  Filled 2023-11-19: qty 300

## 2023-11-19 NOTE — Patient Instructions (Signed)

## 2023-11-23 ENCOUNTER — Inpatient Hospital Stay

## 2023-11-23 VITALS — BP 118/64 | HR 63 | Temp 97.7°F | Resp 14

## 2023-11-23 DIAGNOSIS — N189 Chronic kidney disease, unspecified: Secondary | ICD-10-CM | POA: Diagnosis not present

## 2023-11-23 DIAGNOSIS — D509 Iron deficiency anemia, unspecified: Secondary | ICD-10-CM | POA: Diagnosis not present

## 2023-11-23 DIAGNOSIS — Z79899 Other long term (current) drug therapy: Secondary | ICD-10-CM | POA: Diagnosis not present

## 2023-11-23 DIAGNOSIS — I5030 Unspecified diastolic (congestive) heart failure: Secondary | ICD-10-CM | POA: Diagnosis not present

## 2023-11-23 DIAGNOSIS — K449 Diaphragmatic hernia without obstruction or gangrene: Secondary | ICD-10-CM | POA: Diagnosis not present

## 2023-11-23 DIAGNOSIS — I130 Hypertensive heart and chronic kidney disease with heart failure and stage 1 through stage 4 chronic kidney disease, or unspecified chronic kidney disease: Secondary | ICD-10-CM | POA: Diagnosis not present

## 2023-11-23 DIAGNOSIS — E049 Nontoxic goiter, unspecified: Secondary | ICD-10-CM | POA: Diagnosis not present

## 2023-11-23 DIAGNOSIS — D508 Other iron deficiency anemias: Secondary | ICD-10-CM

## 2023-11-23 MED ORDER — SODIUM CHLORIDE 0.9 % IV SOLN
300.0000 mg | Freq: Once | INTRAVENOUS | Status: AC
  Administered 2023-11-23: 300 mg via INTRAVENOUS
  Filled 2023-11-23: qty 300

## 2023-11-23 MED ORDER — SODIUM CHLORIDE 0.9 % IV SOLN: INTRAVENOUS | Status: DC

## 2023-11-23 NOTE — Patient Instructions (Signed)

## 2023-12-01 ENCOUNTER — Other Ambulatory Visit (HOSPITAL_COMMUNITY): Payer: Self-pay

## 2023-12-01 ENCOUNTER — Other Ambulatory Visit: Payer: Self-pay | Admitting: Emergency Medicine

## 2023-12-01 ENCOUNTER — Other Ambulatory Visit: Payer: Self-pay

## 2023-12-01 DIAGNOSIS — Z8719 Personal history of other diseases of the digestive system: Secondary | ICD-10-CM

## 2023-12-01 MED ORDER — PANTOPRAZOLE SODIUM 40 MG PO TBEC
40.0000 mg | DELAYED_RELEASE_TABLET | Freq: Every day | ORAL | 0 refills | Status: DC
  Filled 2023-12-01: qty 90, 90d supply, fill #0

## 2024-01-04 ENCOUNTER — Other Ambulatory Visit: Payer: Self-pay | Admitting: Emergency Medicine

## 2024-01-04 DIAGNOSIS — R110 Nausea: Secondary | ICD-10-CM

## 2024-01-05 ENCOUNTER — Other Ambulatory Visit (HOSPITAL_COMMUNITY): Payer: Self-pay

## 2024-01-05 ENCOUNTER — Other Ambulatory Visit: Payer: Self-pay

## 2024-01-05 MED ORDER — ONDANSETRON 4 MG PO TBDP
4.0000 mg | ORAL_TABLET | Freq: Three times a day (TID) | ORAL | 4 refills | Status: DC | PRN
  Filled 2024-01-05: qty 20, 7d supply, fill #0
  Filled 2024-02-26: qty 20, 7d supply, fill #1
  Filled 2024-03-16 (×2): qty 20, 7d supply, fill #2
  Filled 2024-04-06: qty 20, 7d supply, fill #3
  Filled 2024-05-12: qty 20, 7d supply, fill #4

## 2024-01-14 ENCOUNTER — Other Ambulatory Visit: Payer: Self-pay

## 2024-01-14 ENCOUNTER — Other Ambulatory Visit: Payer: Self-pay | Admitting: Emergency Medicine

## 2024-01-14 ENCOUNTER — Other Ambulatory Visit (HOSPITAL_COMMUNITY): Payer: Self-pay

## 2024-01-14 DIAGNOSIS — M797 Fibromyalgia: Secondary | ICD-10-CM

## 2024-01-14 DIAGNOSIS — M792 Neuralgia and neuritis, unspecified: Secondary | ICD-10-CM

## 2024-01-14 MED ORDER — GABAPENTIN 300 MG PO CAPS
300.0000 mg | ORAL_CAPSULE | Freq: Four times a day (QID) | ORAL | 1 refills | Status: DC
  Filled 2024-01-14: qty 540, 68d supply, fill #0
  Filled 2024-05-12: qty 540, 68d supply, fill #1

## 2024-01-27 ENCOUNTER — Encounter: Payer: Self-pay | Admitting: Gastroenterology

## 2024-01-27 ENCOUNTER — Other Ambulatory Visit (HOSPITAL_COMMUNITY): Payer: Self-pay

## 2024-01-27 ENCOUNTER — Ambulatory Visit: Admitting: Gastroenterology

## 2024-01-27 ENCOUNTER — Encounter: Payer: Self-pay | Admitting: Hematology and Oncology

## 2024-01-27 VITALS — BP 134/80 | HR 90 | Ht 64.0 in | Wt 215.4 lb

## 2024-01-27 DIAGNOSIS — R1310 Dysphagia, unspecified: Secondary | ICD-10-CM | POA: Diagnosis not present

## 2024-01-27 DIAGNOSIS — D508 Other iron deficiency anemias: Secondary | ICD-10-CM

## 2024-01-27 DIAGNOSIS — R1319 Other dysphagia: Secondary | ICD-10-CM

## 2024-01-27 DIAGNOSIS — K5904 Chronic idiopathic constipation: Secondary | ICD-10-CM

## 2024-01-27 DIAGNOSIS — R09A2 Foreign body sensation, throat: Secondary | ICD-10-CM

## 2024-01-27 DIAGNOSIS — R490 Dysphonia: Secondary | ICD-10-CM | POA: Diagnosis not present

## 2024-01-27 DIAGNOSIS — K449 Diaphragmatic hernia without obstruction or gangrene: Secondary | ICD-10-CM

## 2024-01-27 DIAGNOSIS — K219 Gastro-esophageal reflux disease without esophagitis: Secondary | ICD-10-CM | POA: Diagnosis not present

## 2024-01-27 MED ORDER — NA SULFATE-K SULFATE-MG SULF 17.5-3.13-1.6 GM/177ML PO SOLN
1.0000 | Freq: Once | ORAL | 0 refills | Status: AC
  Filled 2024-01-27: qty 354, 1d supply, fill #0

## 2024-01-27 MED ORDER — DEXLANSOPRAZOLE 60 MG PO CPDR
60.0000 mg | DELAYED_RELEASE_CAPSULE | Freq: Every day | ORAL | 3 refills | Status: DC
Start: 2024-01-27 — End: 2024-02-25
  Filled 2024-01-27: qty 30, 30d supply, fill #0

## 2024-01-27 NOTE — Progress Notes (Signed)
 Chief Complaint:discuss EGD, anemia Primary GI Doctor:(previously Dr. Teressa) Dr. Federico  HPI:  Patient is a  68  year old female patient with past medical history of IDA, CHF, hypertension, GERD with HH, OSA on CPAP, CKD, Fibromyalgia,anxiety, depression, who was self referred to me for a complaint of discus EGD, anemia .   Patient last seen by Amy, PA on 01/02/2021 in the GI office for complaints of rectal bleeding.  11/04/23 last seen by oncology/Hematology for IDA. Pt started on IV iron  infusions. Recommendations for capsule endoscopy to evaluate small bowel for bleeding.  11/03/23 last seen by cardiology for SOB. Reviewed entire note.   Interval History    Patient presents for evaluation of iron  deficiency anemia. Patient has history of iron  deficiency that started approximately 5 years ago with unknown etiology. She denies overt bleeding. Consumes meat. She reports fatigue and low energy. She has sob with exertion, does not require oxygen . She is receiving IV iron .    Patient has history of GERD and taking Pantoprazole  40 mg po daily. She states this helps some. She was on Dexilant  60 mg po daily in the past which controlled her symptoms the best however her insurance stopped covering it.      She reports some hoarseness and globus sensation. She reports she has had issues with eating where food is hard to swallow and go down.Patient denies nausea, vomiting, or weight loss.     Patient has history chronic constipation and has tried OTC laxatives (miralax) and Ducolax without much improvement. She reports she has small amount of stool pass every few days. She at times has urge to have bowel movement with no results. She has a lot of bloating. No blood in stool.   No alcohol use. Nonsmoker.   No NSAID's.   No blood thinners.   Patient's family history includes brother with Crohn's disease she states was diagnosed in his 10's, Sister with reflux and GI surgery of some sort, mother with H/H,  father and brothers with prostate CA.  Wt Readings from Last 3 Encounters:  01/27/24 215 lb 6.4 oz (97.7 kg)  11/19/23 212 lb (96.2 kg)  11/04/23 212 lb 3.2 oz (96.3 kg)    Past Medical History:  Diagnosis Date   Allergy    [name], Flonase    Anemia    Anxiety    Arthritis    Chronic kidney disease    Colon polyps    Depression    Fibromyalgia    Gastritis    GERD (gastroesophageal reflux disease)    Hernia, hiatal    High risk for colon cancer    HLD (hyperlipidemia)    Hypertension    Migraine    Neuropathy    Osteoporosis    Pneumonia    Sleep apnea    c-pap nightly   Sleep apnea with use of continuous positive airway pressure (CPAP)    Thyroid  goiter     Past Surgical History:  Procedure Laterality Date   48-Hour Holter Monitor  06/2017   Mostly normal sinus rhythm.  Average heart rate 71 bpm.  Minimum 54 bpm.  Maximum sinus rate 114 bpm.  Rare PACs and PVCs.  No A. fib, atrial flutter, SVT or VT.  One brief run of 8 beat PAT.   ABDOMINAL HYSTERECTOMY     ovaries intact; DUB.  No dysplasia.   FRACTURE SURGERY N/A    [employer] 09/25/2020   JOINT REPLACEMENT     OPEN REDUCTION INTERNAL FIXATION (ORIF)  DISTAL RADIAL FRACTURE Right 07/09/2017   Procedure: RIGHT WRIST OPEN REDUCTION INTERNAL FIXATION (ORIF) DISTAL RADIAL FRACTURE AND REPAIR AS INDICATED;  Surgeon: Shari Easter, MD;  Location: [facility] OR;  Service: Orthopedics;  Laterality: Right;   PARTIAL HYSTERECTOMY     ROTATOR CUFF REPAIR Right 2009   SHOULDER SURGERY Right    shoulder dislocation, fall, and elbow unla nerve   SHOULDER SURGERY Right 2015   labral tear   TRANSTHORACIC ECHOCARDIOGRAM  06/2017    EF 55-60%.  grade 1 diastolic dysfunction.  Otherwise normal   TUBAL LIGATION     ULNAR NERVE REPAIR Right 08/19/2014    Current Outpatient Medications  Medication Sig Dispense Refill   albuterol  (PROAIR  HFA) 108 (90 Base) MCG/ACT inhaler Inhale 1-2 puffs into the lungs every 4 (four) hours as needed for  wheezing or shortness of breath. 18 g 0   atorvastatin  (LIPITOR) 20 MG tablet Take 1 tablet (20 mg total) by mouth daily. 90 tablet 3   benzonatate  (TESSALON ) 100 MG capsule Take 1 capsule (100 mg total) by mouth 2 (two) times daily as needed for cough. 20 capsule 0   busPIRone  (BUSPAR ) 7.5 MG tablet Take 1 tablet (7.5 mg total) by mouth 2 (two) times daily. 180 tablet 1   butalbital -acetaminophen -caffeine  (FIORICET ) 50-325-40 MG tablet Take 1 tablet by mouth every 6 (six) hours as needed for headache. 20 tablet 0   cyclobenzaprine  (FLEXERIL ) 10 MG tablet Take 1 tablet (10 mg total) by mouth 3 (three) times daily as needed for muscle spasms. 180 tablet 1   dexlansoprazole  (DEXILANT ) 60 MG capsule Take 1 capsule (60 mg total) by mouth daily. 90 capsule 3   DULoxetine  (CYMBALTA ) 60 MG capsule Take 1 capsule (60 mg total) by mouth daily. 90 capsule 1   fluticasone  (FLONASE ) 50 MCG/ACT nasal spray Place 1 spray into both nostrils daily. 16 g 6   furosemide  (LASIX ) 20 MG tablet Take 1 tablet (20 mg total) by mouth daily. 90 tablet 1   gabapentin  (NEURONTIN ) 300 MG capsule Take 1-2 capsules (300-600 mg total) by mouth 4 (four) times daily. 540 capsule 1   lisinopril  (ZESTRIL ) 20 MG tablet Take 1 tablet (20 mg total) by mouth daily. 90 tablet 1   metoprolol  succinate (TOPROL  XL) 25 MG 24 hr tablet Take 1 tablet (25 mg total) by mouth daily. 90 tablet 1   Na Sulfate-K Sulfate-Mg Sulfate concentrate (SUPREP) 17.5-3.13-1.6 GM/177ML SOLN Take 1 kit (354 mLs total) by mouth once for 1 dose. 354 mL 0   ondansetron  (ZOFRAN -ODT) 4 MG disintegrating tablet Dissolve 1 tablet (4 mg total) in mouth every 8 (eight) hours as needed for nausea or vomiting. 20 tablet 4   potassium chloride  SA (KLOR-CON  M) 20 MEQ tablet Take 1 tablet (20 mEq total) by mouth daily. 90 tablet 1   sodium fluoride  (SODIUM FLUORIDE  5000 PPM) 1.1 % GEL dental gel Use before bed.  Do not rinse 100 mL 3   topiramate  (TOPAMAX ) 50 MG tablet Take 1  tablet (50 mg total) by mouth 2 (two) times daily. 180 tablet 1   vortioxetine  HBr (TRINTELLIX ) 10 MG TABS tablet Take 1 tablet (10 mg total) by mouth daily. 90 tablet 1   zolpidem  (AMBIEN ) 5 MG tablet Take 1 tablet (5 mg total) by mouth at bedtime as needed for sleep. 15 tablet 1   No current facility-administered medications for this visit.    Allergies as of 01/27/2024 - Review Complete 01/27/2024  Allergen Reaction Noted  Sulfa antibiotics Itching 01/25/2014    Family History  Problem Relation Age of Onset   Heart disease Mother 16       AMI/CAD/CHF as cause of death   Hypertension Mother    Hyperlipidemia Mother    Hypothyroidism Mother    Depression Mother    Gout Mother    Heart disease Father 64       AMI   Hypertension Father    Stroke Father 55       mild CVA   Prostate cancer Father    Hyperlipidemia Father    Cancer Father 84       prostate cancer   Cancer Sister        Basal cell carcinoma scalp   Depression Sister    Arthritis Sister    Depression Sister    Cancer Maternal Grandmother        type unknown   Cancer Maternal Grandfather        type unknown   Heart disease Paternal Grandmother    Heart defect Paternal Grandfather    Hypertension Brother    Hyperlipidemia Brother    Cancer Brother        prostate cancer   Melanoma Brother    Hyperlipidemia Brother    Hypertension Brother    Cancer Brother        melanoma   Hypertension Brother    Hyperlipidemia Brother    Melanoma Brother    Cancer Brother 25       melanoma   Hypertension Brother    Depression Brother    Hypertension Brother    Gout Brother    Colon cancer Neg Hx    Esophageal cancer Neg Hx    Stomach cancer Neg Hx    Rectal cancer Neg Hx    Breast cancer Neg Hx     Review of Systems:    Constitutional: No weight loss, fever, chills, weakness or fatigue HEENT: Eyes: No change in vision               Ears, Nose, Throat:  No change in hearing or congestion Skin: No rash or  itching Cardiovascular: No chest pain, chest pressure or palpitations   Respiratory: No SOB or cough Gastrointestinal: See HPI and otherwise negative Genitourinary: No dysuria or change in urinary frequency Neurological: No headache, dizziness or syncope Musculoskeletal: No new muscle or joint pain Hematologic: No bleeding or bruising Psychiatric: No history of depression or anxiety    Physical Exam:  Vital signs: BP 134/80   Pulse 90   Ht 5' 4 (1.626 m)   Wt 215 lb 6.4 oz (97.7 kg)   BMI 36.97 kg/m   Constitutional: Pleasant female appears to be in NAD, Well developed, Well nourished, alert and cooperative Throat: Oral cavity and pharynx without inflammation, swelling or lesion.  Respiratory: Respirations even and unlabored. Lungs clear to auscultation bilaterally.   No wheezes, crackles, or rhonchi.  Cardiovascular: Normal S1, S2. Regular rate and rhythm. No peripheral edema, cyanosis or pallor.  Gastrointestinal:  Soft, nondistended, nontender. No rebound or guarding. Normal bowel sounds. No appreciable masses or hepatomegaly. Rectal:  Not performed.  Msk:  Symmetrical without gross deformities. Without edema, no deformity or joint abnormality.  Neurologic:  Alert and  oriented x4;  grossly normal neurologically.  Skin:   Dry and intact without significant lesions or rashes. Psychiatric: Oriented to person, place and time. Demonstrates good judgement and reason without abnormal affect or behaviors.  RELEVANT LABS AND  IMAGING: CBC    Latest Ref Rng & Units 10/08/2023    1:48 PM 10/06/2023    6:16 PM 10/22/2022    2:53 PM  CBC  WBC 4.0 - 10.5 K/uL 4.9  4.3  5.7   Hemoglobin 12.0 - 15.0 g/dL 89.4  9.0  86.7   Hematocrit 36.0 - 46.0 % 33.8  30.3  39.4   Platelets 150.0 - 400.0 K/uL 280.0  213  195.0      CMP     Latest Ref Rng & Units 10/26/2023    2:04 PM 10/06/2023    6:16 PM 10/22/2022    2:53 PM  CMP  Glucose 70 - 99 mg/dL 97  78  89   BUN 6 - 23 mg/dL 18  8  11     Creatinine 0.40 - 1.20 mg/dL 8.97  9.12  8.85   Sodium 135 - 145 mEq/L 141  139  139   Potassium 3.5 - 5.1 mEq/L 4.5  3.9  3.9   Chloride 96 - 112 mEq/L 110  108  109   CO2 19 - 32 mEq/L 25  24  24    Calcium  8.4 - 10.5 mg/dL 9.0  8.7  9.3   Total Protein 6.0 - 8.3 g/dL 6.8  6.5  6.6   Total Bilirubin 0.2 - 1.2 mg/dL 0.3  0.4  0.4   Alkaline Phos 39 - 117 U/L 61  56  51   AST 0 - 37 U/L 14  22  13    ALT 0 - 35 U/L 11  17  9       Lab Results  Component Value Date   TSH 1.20 10/26/2023  10/20/2023 echo-Left ventricular ejection fraction, by estimation, is 60 to 65%.  10/06/23 fecal occult negative 01/31/2021 colonoscopy with Dr. Teressa, recall 3 years - One 7 mm polyp in the descending colon, removed with a cold snare. Resected and retrieved. - Diverticulosis in the left colon. - The site of 2020 rectal polypectomy was easily located. No sign of recurrent neoplastic mucosa. - The examination was otherwise normal on direct and retroflexion views. --The minor rectal bleeding was most likely from hemorrhoids which can come and go, currently no hemorrhoids are present. 07/25/2019 flex sig with Dr. Teressa -- The site of recent polypectomy in the distal rectum was easily located ( scar tissue and previous Spot tattoo) . There was no residual or recurrent adenomatous mucosa however the site was sampled with biopsy forceps. - The examination to the splenic flexure was otherwise normal. 01/04/2019 flex sig with Dr. Teressa-- The site of recent rectal polypectomy was easily found, located about 2cm from the anal verge. There was no obvious remaining polyp tissue however I used snare/ cautery to remove the polyectomy site and then biopsied the edges of the site. Lastly I injected SPOT submucosally adjacent to the polypectomy site to aid in future localization. 12/21/2018 EGD with Dr. Teressa for iron  deficiency anemia - Medium- sized hiatal hernia with Ole' s type erosions. This is very possibly the source of  her iron  deficiency anemia ( medium sized rectal polyp Aimy Sweeting have contributed as well) . - The examination was otherwise normal. 12/21/2018 colonoscopy with Dr. Teressa iron  deficiency anemia - Two 10 to 11 mm polyps in the ascending colon, removed with a hot snare. Resected and retrieved. - One 8 mm polyp in the descending colon, removed with a cold snare. Resected and retrieved. - One 16 mm polyp in the rectum, removed with a hot  snare. Resected and retrieved. - The examination was otherwise normal on direct and retroflexion views. 08/29/2016 EGD with Dr. Teressa for nausea, remote atrial ulcer - Normal esophagus. - Large hiatal hernia. - Gastritis. Biopsied to check for H. pylori. - Normal examined duodenum.  Assessment: Encounter Diagnoses  Name Primary?   Other iron  deficiency anemia Yes   Hiatal hernia with GERD    Esophageal dysphagia    Hoarseness    Globus sensation    Chronic idiopathic constipation       68 year old female patient with history of iron  deficiency anemia treated with iron  infusions approximately four to five years ago. Previous investigations, including a colonoscopy and endoscopy in 2020, did not reveal a definitive cause for her recurrent iron  deficiency. A polyp was found and removed during the colonoscopy 6/22, and she undergoes surveillance colonoscopies every three years. She is due for colonoscopy, will schedule today with 2 day prep. She does have history of GERD with medium sized HH/Cameron erosions so will also order upper GI endoscopy to evaluate for source of bleeding.  Patient also has esophageal dysphagia and can evaluate for stricture, web, or esophagitis.  Patient has had uncontrolled reflux on pantoprazole  and has done well in the past on Dexilant  which now has a generic, will send new prescription to see if covered.  If negative EGD/colon will proceed with small capsule.  For the chronic constipation we discussed high-fiber diet and will provide samples of  Linzess pro secretory agent to see if this provides her with better relief.  Plan: -Reinforced GERD diet, no late meals 3-4 hours before lying down -Switch to Dexlansoprazole  60 mg po daily -Recommend High fiber diet -Samples of Linzess 72 mcg po daily provided  -Schedule for a colonoscopy with 2 day prep in [facility] with Dr. Federico . The risks and benefits of colonoscopy with possible polypectomy / biopsies were discussed and the patient agrees to proceed.  -  Schedule EGD in [facility] with Dr. Federico. The risks and benefits of EGD with possible biopsies and esophageal dilation were discussed with the patient who agrees to proceed. -pending small capsule endoscopy    Thank you for the courtesy of this consult. Please call me with any questions or concerns.   Eisen Robenson, FNP-C [facility] 01/27/2024, 4:15 PM  Cc: Sagardia, Miguel Jose, *

## 2024-01-27 NOTE — Patient Instructions (Addendum)
 GERD Recommend GERD diet, no late meals 3-4 hours before lying down Switch to Dexlansoprazole  60 mg po daily  Constipation Recommend High fiber diet Provided samples of Linzess 72 mcg take 1 tablet 30-45 minutes before first meal of the day with full glass of water  You have been scheduled for a colonoscopy. Please follow written instructions given to you at your visit today.   If you use inhalers (even only as needed), please bring them with you on the day of your procedure.  DO NOT TAKE 7 DAYS PRIOR TO TEST- Trulicity (dulaglutide) Ozempic , Wegovy  (semaglutide ) Mounjaro (tirzepatide) Bydureon Bcise (exanatide extended release)  DO NOT TAKE 1 DAY PRIOR TO YOUR TEST Rybelsus  (semaglutide ) Adlyxin (lixisenatide) Victoza (liraglutide) Byetta (exanatide) ___________________________________________________________________________  Due to recent changes in healthcare laws, you may see the results of your imaging and laboratory studies on MyChart before your provider has had a chance to review them.  We understand that in some cases there may be results that are confusing or concerning to you. Not all laboratory results come back in the same time frame and the provider may be waiting for multiple results in order to interpret others.  Please give us  48 hours in order for your provider to thoroughly review all the results before contacting the office for clarification of your results.   _______________________________________________________  If your blood pressure at your visit was 140/90 or greater, please contact your primary care physician to follow up on this.  _______________________________________________________  If you are age 68 or older, your body mass index should be between 23-30. Your Body mass index is 36.97 kg/m. If this is out of the aforementioned range listed, please consider follow up with your Primary Care Provider.  If you are age 73 or younger, your body mass  index should be between 19-25. Your Body mass index is 36.97 kg/m. If this is out of the aformentioned range listed, please consider follow up with your Primary Care Provider.   ________________________________________________________  The  GI providers would like to encourage you to use MYCHART to communicate with providers for non-urgent requests or questions.  Due to long hold times on the telephone, sending your provider a message by [facility] may be a faster and more efficient way to get a response.  Please allow 48 business hours for a response.  Please remember that this is for non-urgent requests.  _______________________________________________________  Thank you for trusting me with your gastrointestinal care. Deanna May, RNP

## 2024-01-27 NOTE — Progress Notes (Signed)
 I agree with the assessment and plan as outlined by Ms. May.

## 2024-01-28 ENCOUNTER — Other Ambulatory Visit (HOSPITAL_COMMUNITY): Payer: Self-pay

## 2024-01-30 DIAGNOSIS — H524 Presbyopia: Secondary | ICD-10-CM | POA: Diagnosis not present

## 2024-02-10 ENCOUNTER — Telehealth: Payer: Self-pay | Admitting: Gastroenterology

## 2024-02-10 NOTE — Telephone Encounter (Signed)
 Inbound call from patient, would like prescription for Linzess called into her pharamacy. States samples are working.

## 2024-02-12 ENCOUNTER — Other Ambulatory Visit (HOSPITAL_COMMUNITY): Payer: Self-pay

## 2024-02-12 MED ORDER — LINACLOTIDE 72 MCG PO CAPS
72.0000 ug | ORAL_CAPSULE | Freq: Every day | ORAL | 2 refills | Status: AC
  Filled 2024-02-12: qty 30, 30d supply, fill #0
  Filled 2024-04-06: qty 30, 30d supply, fill #1
  Filled 2024-08-22: qty 30, 30d supply, fill #0

## 2024-02-12 NOTE — Telephone Encounter (Signed)
 Linzess  72 mcg script sent to pharmacy.

## 2024-02-19 ENCOUNTER — Encounter: Payer: Self-pay | Admitting: Advanced Practice Midwife

## 2024-02-25 ENCOUNTER — Encounter: Payer: Self-pay | Admitting: Internal Medicine

## 2024-02-25 ENCOUNTER — Ambulatory Visit: Admitting: Internal Medicine

## 2024-02-25 ENCOUNTER — Other Ambulatory Visit (HOSPITAL_COMMUNITY): Payer: Self-pay

## 2024-02-25 ENCOUNTER — Telehealth: Payer: Self-pay

## 2024-02-25 VITALS — BP 136/73 | HR 64 | Temp 97.4°F | Resp 19 | Ht 64.0 in | Wt 215.0 lb

## 2024-02-25 DIAGNOSIS — M797 Fibromyalgia: Secondary | ICD-10-CM | POA: Diagnosis not present

## 2024-02-25 DIAGNOSIS — G4730 Sleep apnea, unspecified: Secondary | ICD-10-CM | POA: Diagnosis not present

## 2024-02-25 DIAGNOSIS — K2970 Gastritis, unspecified, without bleeding: Secondary | ICD-10-CM

## 2024-02-25 DIAGNOSIS — K648 Other hemorrhoids: Secondary | ICD-10-CM | POA: Diagnosis not present

## 2024-02-25 DIAGNOSIS — Z1211 Encounter for screening for malignant neoplasm of colon: Secondary | ICD-10-CM | POA: Diagnosis not present

## 2024-02-25 DIAGNOSIS — K5730 Diverticulosis of large intestine without perforation or abscess without bleeding: Secondary | ICD-10-CM

## 2024-02-25 DIAGNOSIS — D122 Benign neoplasm of ascending colon: Secondary | ICD-10-CM | POA: Diagnosis not present

## 2024-02-25 DIAGNOSIS — D123 Benign neoplasm of transverse colon: Secondary | ICD-10-CM | POA: Diagnosis not present

## 2024-02-25 DIAGNOSIS — D509 Iron deficiency anemia, unspecified: Secondary | ICD-10-CM | POA: Diagnosis not present

## 2024-02-25 DIAGNOSIS — K449 Diaphragmatic hernia without obstruction or gangrene: Secondary | ICD-10-CM | POA: Diagnosis not present

## 2024-02-25 DIAGNOSIS — K5710 Diverticulosis of small intestine without perforation or abscess without bleeding: Secondary | ICD-10-CM

## 2024-02-25 DIAGNOSIS — R1310 Dysphagia, unspecified: Secondary | ICD-10-CM | POA: Diagnosis not present

## 2024-02-25 DIAGNOSIS — D508 Other iron deficiency anemias: Secondary | ICD-10-CM

## 2024-02-25 DIAGNOSIS — K635 Polyp of colon: Secondary | ICD-10-CM | POA: Diagnosis not present

## 2024-02-25 DIAGNOSIS — K219 Gastro-esophageal reflux disease without esophagitis: Secondary | ICD-10-CM | POA: Diagnosis not present

## 2024-02-25 DIAGNOSIS — K3189 Other diseases of stomach and duodenum: Secondary | ICD-10-CM | POA: Diagnosis not present

## 2024-02-25 DIAGNOSIS — F419 Anxiety disorder, unspecified: Secondary | ICD-10-CM | POA: Diagnosis not present

## 2024-02-25 DIAGNOSIS — R1319 Other dysphagia: Secondary | ICD-10-CM

## 2024-02-25 DIAGNOSIS — F32A Depression, unspecified: Secondary | ICD-10-CM | POA: Diagnosis not present

## 2024-02-25 DIAGNOSIS — L818 Other specified disorders of pigmentation: Secondary | ICD-10-CM

## 2024-02-25 DIAGNOSIS — Z860100 Personal history of colon polyps, unspecified: Secondary | ICD-10-CM

## 2024-02-25 MED ORDER — SODIUM CHLORIDE 0.9 % IV SOLN: 500.0000 mL | Freq: Once | INTRAVENOUS | Status: DC

## 2024-02-25 MED ORDER — SODIUM CHLORIDE 0.9 % IV SOLN: 500.0000 mL | INTRAVENOUS | Status: DC

## 2024-02-25 MED ORDER — PANTOPRAZOLE SODIUM 40 MG PO TBEC
40.0000 mg | DELAYED_RELEASE_TABLET | Freq: Two times a day (BID) | ORAL | 3 refills | Status: DC
  Filled 2024-02-25: qty 180, 90d supply, fill #0
  Filled 2024-04-06: qty 180, 90d supply, fill #1

## 2024-02-25 NOTE — Progress Notes (Signed)
 A/o x 3, VSS, gd SR's, pleased with anesthesia, report to RN

## 2024-02-25 NOTE — Telephone Encounter (Signed)
 Did you want pt to have voquenza samples? Dr Federico prescribed pantoprazole  today after her endocolon?

## 2024-02-25 NOTE — Progress Notes (Signed)
 Called to room to assist during endoscopic procedure.  Patient ID and intended procedure confirmed with present staff. Received instructions for my participation in the procedure from the performing physician.

## 2024-02-25 NOTE — Telephone Encounter (Signed)
 Someone from the office brought up samples and Dr Federico did not know who or why, I gave them to the pt but instructed her to take the pantoprazole  for now as instructed by Dr Federico and then f/u if sx do not improve and she has the samples if needed to start you can instruct her after you see her for follow up in september when Dr Federico is on maternity leave.

## 2024-02-25 NOTE — Patient Instructions (Addendum)
 Resume previous diet Continue present medications Await pathology results Office visit 05/02/24 at 11 AM with Deanna May, NP Minimize use of  NSAIDS (Non-Steroidal anti-inflammatory drugs).  (These include, aspirin, aspirin-containing products(products containing salicylic acid like Pepto Bismol and Alka Seltzer), ibuprofen, advil, motrin, naproxen, aleve, goody powders, etc) Tylenol  is ok to take as needed, see label for instructions.  Handouts/information given for Gastritis, Hiatal Hernia, polyps, diverticulosis and hemorrhoids  YOU HAD AN ENDOSCOPIC PROCEDURE TODAY AT THE [facility]:   Refer to the procedure report that was given to you for any specific questions about what was found during the examination.  If the procedure report does not answer your questions, please call your gastroenterologist to clarify.  If you requested that your care partner not be given the details of your procedure findings, then the procedure report has been included in a sealed envelope for you to review at your convenience later.  YOU SHOULD EXPECT: Some feelings of bloating in the abdomen. Passage of more gas than usual.  Walking can help get rid of the air that was put into your GI tract during the procedure and reduce the bloating. If you had a lower endoscopy (such as a colonoscopy or flexible sigmoidoscopy) you may notice spotting of blood in your stool or on the toilet paper. If you underwent a bowel prep for your procedure, you may not have a normal bowel movement for a few days.  Please Note:  You might notice some irritation and congestion in your nose or some drainage.  This is from the oxygen  used during your procedure.  There is no need for concern and it should clear up in a day or so.  SYMPTOMS TO REPORT IMMEDIATELY:  Following lower endoscopy (colonoscopy):  Excessive amounts of blood in the stool  Significant tenderness or worsening of abdominal pains  Swelling of the abdomen that is  new, acute  Fever of 100F or higher Following upper endoscopy (EGD)  Vomiting of blood or coffee ground material  New chest pain or pain under the shoulder blades  Painful or persistently difficult swallowing  New shortness of breath  Black, tarry-looking stools For urgent or emergent issues, a gastroenterologist can be reached at any hour by calling [phone]. Do not use MyChart messaging for urgent concerns.   DIET:  We do recommend a small meal at first, but then you may proceed to your regular diet.  Drink plenty of fluids but you should avoid alcoholic beverages for 24 hours.  ACTIVITY:  You should plan to take it easy for the rest of today and you should NOT DRIVE or use heavy machinery until tomorrow (because of the sedation medicines used during the test).    FOLLOW UP: Our staff will call the number listed on your records the next business day following your procedure.  We will call around 7:15- 8:00 am to check on you and address any questions or concerns that you may have regarding the information given to you following your procedure. If we do not reach you, we will leave a message.     If any biopsies were taken you will be contacted by phone or by letter within the next 1-3 weeks.  Please call us  at [phone] if you have not heard about the biopsies in 3 weeks.   SIGNATURES/CONFIDENTIALITY: You and/or your care partner have signed paperwork which will be entered into your electronic medical record.  These signatures attest to the fact that that the  information above on your After Visit Summary has been reviewed and is understood.  Full responsibility of the confidentiality of this discharge information lies with you and/or your care-partner.

## 2024-02-25 NOTE — Op Note (Signed)
 [facility] Patient Name: [name] Procedure Date: 02/25/2024 2:52 PM MRN: [id number] Endoscopist: Rosario Estefana Kidney , , [phone] Age: 68 Referring MD:  Date of Birth: 05-05-1956 Gender: Female Account #: [id number] Procedure:                Colonoscopy Indications:              High risk colon cancer surveillance: Personal                            history of colonic polyps, Incidental - Iron                             deficiency anemia Medicines:                Monitored Anesthesia Care Procedure:                Pre-Anesthesia Assessment:                           - Prior to the procedure, a History and Physical                            was performed, and patient medications and                            allergies were reviewed. The patient's tolerance of                            previous anesthesia was also reviewed. The risks                            and benefits of the procedure and the sedation                            options and risks were discussed with the patient.                            All questions were answered, and informed consent                            was obtained. Prior Anticoagulants: The patient has                            taken no anticoagulant or antiplatelet agents. ASA                            Grade Assessment: II - A patient with mild systemic                            disease. After reviewing the risks and benefits,                            the patient was deemed in satisfactory condition to  undergo the procedure.                           After obtaining informed consent, the colonoscope                            was passed under direct vision. Throughout the                            procedure, the patient's blood pressure, pulse, and                            oxygen  saturations were monitored continuously. The                            CF [id number] #[id number] was introduced through  the anus                            and advanced to the the terminal ileum. The                            colonoscopy was performed without difficulty. The                            patient tolerated the procedure well. The quality                            of the bowel preparation was good. The terminal                            ileum, ileocecal valve, appendiceal orifice, and                            rectum were photographed. Scope In: 3:28:16 PM Scope Out: 3:54:36 PM Scope Withdrawal Time: 0 hours 21 minutes 41 seconds  Total Procedure Duration: 0 hours 26 minutes 20 seconds  Findings:                 The terminal ileum appeared normal.                           Five sessile polyps were found in the transverse                            colon and ascending colon. The polyps were 3 to 12                            mm in size. These polyps were removed with a cold                            snare. Resection and retrieval were complete.                           Multiple diverticula were found in the sigmoid  colon and descending colon.                           A tattoo was seen in the rectum.                           Non-bleeding internal hemorrhoids were found during                            retroflexion. Complications:            No immediate complications. Estimated Blood Loss:     Estimated blood loss was minimal. Impression:               - The examined portion of the ileum was normal.                           - Five 3 to 12 mm polyps in the transverse colon                            and in the ascending colon, removed with a cold                            snare. Resected and retrieved.                           - Diverticulosis in the sigmoid colon and in the                            descending colon.                           - A tattoo was seen in the rectum.                           - Non-bleeding internal hemorrhoids. Recommendation:            - Discharge patient to home (with escort).                           - Await pathology results.                           - The findings and recommendations were discussed                            with the patient. Dr Estefana Federico Rosario Estefana Federico,  02/25/2024 4:04:03 PM

## 2024-02-25 NOTE — Op Note (Signed)
 [facility] Patient Name: [name] Procedure Date: 02/25/2024 2:59 PM MRN: [id number] Endoscopist: Rosario Estefana Kidney , , [phone] Age: 68 Referring MD:  Date of Birth: [birth year] Gender: Female Account #: [id number] Procedure:                Upper GI endoscopy Indications:              Iron  deficiency anemia, Dysphagia, Globus sensation Medicines:                Monitored Anesthesia Care Procedure:                Pre-Anesthesia Assessment:                           - Prior to the procedure, a History and Physical                            was performed, and patient medications and                            allergies were reviewed. The patient's tolerance of                            previous anesthesia was also reviewed. The risks                            and benefits of the procedure and the sedation                            options and risks were discussed with the patient.                            All questions were answered, and informed consent                            was obtained. Prior Anticoagulants: The patient has                            taken no anticoagulant or antiplatelet agents. ASA                            Grade Assessment: II - A patient with mild systemic                            disease. After reviewing the risks and benefits,                            the patient was deemed in satisfactory condition to                            undergo the procedure.                           After obtaining informed consent, the endoscope was  passed under direct vision. Throughout the                            procedure, the patient's blood pressure, pulse, and                            oxygen  saturations were monitored continuously. The                            Olympus Scope [id number] was introduced through the                            mouth, and advanced to the second part of duodenum.                             The upper GI endoscopy was accomplished without                            difficulty. The patient tolerated the procedure                            well. Scope In: Scope Out: Findings:                 The examined esophagus was normal. A guidewire was                            placed and the scope was withdrawn. Dilation was                            performed with a Savary dilator with mild                            resistance at 19 mm. Biopsies were taken with a                            cold forceps for histology.                           A large hiatal hernia was present.                           Localized inflammation characterized by congestion                            (edema), erosions, erythema and serpentine                            ulcerations was found in the gastric antrum.                            Biopsies were taken with a cold forceps for                            histology.  A medium non-bleeding diverticulum was found in the                            second portion of the duodenum. Complications:            No immediate complications. Estimated Blood Loss:     Estimated blood loss was minimal. Impression:               - Normal esophagus. Dilated. Biopsied.                           - Large hiatal hernia.                           - Gastritis. Biopsied.                           - Non-bleeding duodenal diverticulum. Recommendation:           - Await pathology results.                           - Avoid NSAIDs if possible                           - Pantoprazole  40 mg BID for 10 weeks, then QD                           - Return to GI clinic in 2-3 months.                           - Perform a colonoscopy today. Dr Estefana Federico Rosario Estefana Federico,  02/25/2024 4:00:17 PM

## 2024-02-25 NOTE — Progress Notes (Signed)
 GASTROENTEROLOGY PROCEDURE H&P NOTE   Primary Care Physician: Purcell Emil Schanz, MD    Reason for Procedure:   IDA, dysphagia, GERD, history of colon polyps  Plan:    EGD/colonoscopy  Patient is appropriate for endoscopic procedure(s) in the ambulatory ([facility]) setting.  The nature of the procedure, as well as the risks, benefits, and alternatives were carefully and thoroughly reviewed with the patient. Ample time for discussion and questions allowed. The patient understood, was satisfied, and agreed to proceed.     HPI: [name] is a 68 y.o. female who presents for EGD/colonoscopy for evaluation of IDA, dysphagia, GERD.  Patient was most recently seen in the Gastroenterology Clinic on 01/27/24.  No interval change in medical history since that appointment. Please refer to that note for full details regarding GI history and clinical presentation.   Past Medical History:  Diagnosis Date   Allergy    [name], Flonase    Anemia    Anxiety    Arthritis    Chronic kidney disease    Colon polyps    Depression    Fibromyalgia    Gastritis    GERD (gastroesophageal reflux disease)    Hernia, hiatal    High risk for colon cancer    HLD (hyperlipidemia)    Hypertension    Migraine    Neuropathy    Osteoporosis    Pneumonia    Sleep apnea    c-pap nightly   Sleep apnea with use of continuous positive airway pressure (CPAP)    Thyroid  goiter     Past Surgical History:  Procedure Laterality Date   48-Hour Holter Monitor  06/2017   Mostly normal sinus rhythm.  Average heart rate 71 bpm.  Minimum 54 bpm.  Maximum sinus rate 114 bpm.  Rare PACs and PVCs.  No A. fib, atrial flutter, SVT or VT.  One brief run of 8 beat PAT.   ABDOMINAL HYSTERECTOMY     ovaries intact; DUB.  No dysplasia.   FRACTURE SURGERY N/A    [employer] 09/25/2020   JOINT REPLACEMENT     OPEN REDUCTION INTERNAL FIXATION (ORIF) DISTAL RADIAL FRACTURE Right 07/09/2017   Procedure: RIGHT WRIST OPEN  REDUCTION INTERNAL FIXATION (ORIF) DISTAL RADIAL FRACTURE AND REPAIR AS INDICATED;  Surgeon: Shari Easter, MD;  Location: [facility] OR;  Service: Orthopedics;  Laterality: Right;   PARTIAL HYSTERECTOMY     ROTATOR CUFF REPAIR Right 2009   SHOULDER SURGERY Right    shoulder dislocation, fall, and elbow unla nerve   SHOULDER SURGERY Right 2015   labral tear   TRANSTHORACIC ECHOCARDIOGRAM  06/2017    EF 55-60%.  grade 1 diastolic dysfunction.  Otherwise normal   TUBAL LIGATION     ULNAR NERVE REPAIR Right 08/19/2014    Prior to Admission medications   Medication Sig Start Date End Date Taking? Authorizing Provider  albuterol  (PROAIR  HFA) 108 (90 Base) MCG/ACT inhaler Inhale 1-2 puffs into the lungs every 4 (four) hours as needed for wheezing or shortness of breath. 08/21/22   Moishe Chiquita HERO, NP  atorvastatin  (LIPITOR) 20 MG tablet Take 1 tablet (20 mg total) by mouth daily. 10/05/23   Sagardia, Miguel Jose, MD  benzonatate  (TESSALON ) 100 MG capsule Take 1 capsule (100 mg total) by mouth 2 (two) times daily as needed for cough. 08/22/23   Rolan Berthold, PA-C  busPIRone  (BUSPAR ) 7.5 MG tablet Take 1 tablet (7.5 mg total) by mouth 2 (two) times daily. 10/05/23   Sagardia, Miguel Jose, MD  butalbital -acetaminophen -caffeine  (FIORICET ) 50-325-40 MG tablet Take 1 tablet by mouth every 6 (six) hours as needed for headache. 02/20/23   Norleen Lynwood ORN, MD  cyclobenzaprine  (FLEXERIL ) 10 MG tablet Take 1 tablet (10 mg total) by mouth 3 (three) times daily as needed for muscle spasms. 09/03/23   Purcell Emil Schanz, MD  DULoxetine  (CYMBALTA ) 60 MG capsule Take 1 capsule (60 mg total) by mouth daily. 10/05/23   Sagardia, Miguel Jose, MD  fluticasone  (FLONASE ) 50 MCG/ACT nasal spray Place 1 spray into both nostrils daily. 02/19/23   Sagardia, Miguel Jose, MD  furosemide  (LASIX ) 20 MG tablet Take 1 tablet (20 mg total) by mouth daily. 10/16/23   Rana Lum CROME, NP  gabapentin  (NEURONTIN ) 300 MG capsule Take 1-2 capsules  (300-600 mg total) by mouth 4 (four) times daily. 01/14/24   Purcell Emil Schanz, MD  linaclotide  (LINZESS ) 72 MCG capsule Take 1 capsule (72 mcg total) by mouth daily before breakfast. 02/12/24   May, Deanna J, NP  lisinopril  (ZESTRIL ) 20 MG tablet Take 1 tablet (20 mg total) by mouth daily. 11/12/23   Purcell Emil Schanz, MD  metoprolol  succinate (TOPROL  XL) 25 MG 24 hr tablet Take 1 tablet (25 mg total) by mouth daily. 10/16/23   Rana Lum CROME, NP  ondansetron  (ZOFRAN -ODT) 4 MG disintegrating tablet Dissolve 1 tablet (4 mg total) in mouth every 8 (eight) hours as needed for nausea or vomiting. 01/05/24   Purcell Emil Schanz, MD  potassium chloride  SA (KLOR-CON  M) 20 MEQ tablet Take 1 tablet (20 mEq total) by mouth daily. 10/16/23   Rana Lum CROME, NP  sodium fluoride  (SODIUM FLUORIDE  5000 PPM) 1.1 % GEL dental gel Use before bed.  Do not rinse 04/27/23     topiramate  (TOPAMAX ) 50 MG tablet Take 1 tablet (50 mg total) by mouth 2 (two) times daily. 10/05/23   Sagardia, Miguel Jose, MD  vortioxetine  HBr (TRINTELLIX ) 10 MG TABS tablet Take 1 tablet (10 mg total) by mouth daily. 11/12/23   Purcell Emil Schanz, MD  zolpidem  (AMBIEN ) 5 MG tablet Take 1 tablet (5 mg total) by mouth at bedtime as needed for sleep. 10/26/23   Sagardia, Miguel Jose, MD    Current Outpatient Medications  Medication Sig Dispense Refill   albuterol  (PROAIR  HFA) 108 (90 Base) MCG/ACT inhaler Inhale 1-2 puffs into the lungs every 4 (four) hours as needed for wheezing or shortness of breath. 18 g 0   atorvastatin  (LIPITOR) 20 MG tablet Take 1 tablet (20 mg total) by mouth daily. 90 tablet 3   benzonatate  (TESSALON ) 100 MG capsule Take 1 capsule (100 mg total) by mouth 2 (two) times daily as needed for cough. 20 capsule 0   busPIRone  (BUSPAR ) 7.5 MG tablet Take 1 tablet (7.5 mg total) by mouth 2 (two) times daily. 180 tablet 1   butalbital -acetaminophen -caffeine  (FIORICET ) 50-325-40 MG tablet Take 1 tablet by mouth every 6  (six) hours as needed for headache. 20 tablet 0   cyclobenzaprine  (FLEXERIL ) 10 MG tablet Take 1 tablet (10 mg total) by mouth 3 (three) times daily as needed for muscle spasms. 180 tablet 1   DULoxetine  (CYMBALTA ) 60 MG capsule Take 1 capsule (60 mg total) by mouth daily. 90 capsule 1   fluticasone  (FLONASE ) 50 MCG/ACT nasal spray Place 1 spray into both nostrils daily. 16 g 6   furosemide  (LASIX ) 20 MG tablet Take 1 tablet (20 mg total) by mouth daily. 90 tablet 1   gabapentin  (NEURONTIN ) 300 MG capsule Take 1-2 capsules (  300-600 mg total) by mouth 4 (four) times daily. 540 capsule 1   linaclotide  (LINZESS ) 72 MCG capsule Take 1 capsule (72 mcg total) by mouth daily before breakfast. 30 capsule 2   lisinopril  (ZESTRIL ) 20 MG tablet Take 1 tablet (20 mg total) by mouth daily. 90 tablet 1   metoprolol  succinate (TOPROL  XL) 25 MG 24 hr tablet Take 1 tablet (25 mg total) by mouth daily. 90 tablet 1   ondansetron  (ZOFRAN -ODT) 4 MG disintegrating tablet Dissolve 1 tablet (4 mg total) in mouth every 8 (eight) hours as needed for nausea or vomiting. 20 tablet 4   potassium chloride  SA (KLOR-CON  M) 20 MEQ tablet Take 1 tablet (20 mEq total) by mouth daily. 90 tablet 1   sodium fluoride  (SODIUM FLUORIDE  5000 PPM) 1.1 % GEL dental gel Use before bed.  Do not rinse 100 mL 3   topiramate  (TOPAMAX ) 50 MG tablet Take 1 tablet (50 mg total) by mouth 2 (two) times daily. 180 tablet 1   vortioxetine  HBr (TRINTELLIX ) 10 MG TABS tablet Take 1 tablet (10 mg total) by mouth daily. 90 tablet 1   zolpidem  (AMBIEN ) 5 MG tablet Take 1 tablet (5 mg total) by mouth at bedtime as needed for sleep. 15 tablet 1   Current Facility-Administered Medications  Medication Dose Route Frequency Provider Last Rate Last Admin   0.9 %  sodium chloride  infusion  500 mL Intravenous Once Federico Rosario BROCKS, MD        Allergies as of 02/25/2024 - Review Complete 02/25/2024  Allergen Reaction Noted   Sulfa antibiotics Itching 01/25/2014     Family History  Problem Relation Age of Onset   Heart disease Mother 71       AMI/CAD/CHF as cause of death   Hypertension Mother    Hyperlipidemia Mother    Hypothyroidism Mother    Depression Mother    Gout Mother    Heart disease Father 75       AMI   Hypertension Father    Stroke Father 26       mild CVA   Prostate cancer Father    Hyperlipidemia Father    Cancer Father 33       prostate cancer   Cancer Sister        Basal cell carcinoma scalp   Depression Sister    Arthritis Sister    Depression Sister    Cancer Maternal Grandmother        type unknown   Cancer Maternal Grandfather        type unknown   Heart disease Paternal Grandmother    Heart defect Paternal Grandfather    Hypertension Brother    Hyperlipidemia Brother    Cancer Brother        prostate cancer   Melanoma Brother    Hyperlipidemia Brother    Hypertension Brother    Cancer Brother        melanoma   Hypertension Brother    Hyperlipidemia Brother    Melanoma Brother    Cancer Brother 25       melanoma   Hypertension Brother    Depression Brother    Hypertension Brother    Gout Brother    Colon cancer Neg Hx    Esophageal cancer Neg Hx    Stomach cancer Neg Hx    Rectal cancer Neg Hx    Breast cancer Neg Hx     Social History   Socioeconomic History   Marital status: Married  Spouse name: Not on file   Number of children: 3   Years of education: Not on file   Highest education level: Not on file  Occupational History   Occupation: unemployed  Tobacco Use   Smoking status: Former    Current packs/day: 0.00    Types: Cigarettes    Quit date: 08/04/1998    Years since quitting: 25.5   Smokeless tobacco: Never  Vaping Use   Vaping status: Never Used  Substance and Sexual Activity   Alcohol use: No   Drug use: No   Sexual activity: Yes    Birth control/protection: Surgical, Post-menopausal  Other Topics Concern   Not on file  Social History Narrative   Marital  status: married x 20 years; moderately happy     Children: 3 children (47 Kayla, 72 Dianna, 40 April); 8 grandchildren; 0 gg     Lives: with husband/Steve, April, 2 granddaughters     Employment:  Unemployed; quit working 2015 with work related injury; awaiting disability in 2018; disability approved in 02/2017.       Tobacco: quit smoking in 2016; smoked x 2 years.      Alcohol: none      Drugs: none      Exercise: rarely in 2018         Social Drivers of Health   Financial Resource Strain: Low Risk  (03/31/2023)   Overall Financial Resource Strain (CARDIA)    Difficulty of Paying Living Expenses: Not hard at all  Food Insecurity: No Food Insecurity (03/31/2023)   Hunger Vital Sign    Worried About Running Out of Food in the Last Year: Never true    Ran Out of Food in the Last Year: Never true  Transportation Needs: No Transportation Needs (03/31/2023)   PRAPARE - Administrator, Civil Service (Medical): No    Lack of Transportation (Non-Medical): No  Physical Activity: Inactive (03/31/2023)   Exercise Vital Sign    Days of Exercise per Week: 0 days    Minutes of Exercise per Session: 0 min  Stress: No Stress Concern Present (03/31/2023)   [employer] of Occupational Health - Occupational Stress Questionnaire    Feeling of Stress : Not at all  Social Connections: Unknown (03/31/2023)   Social Connection and Isolation Panel    Frequency of Communication with Friends and Family: More than three times a week    Frequency of Social Gatherings with Friends and Family: More than three times a week    Attends Religious Services: Not on file    Active Member of Clubs or Organizations: Yes    Attends Banker Meetings: Not on file    Marital Status: Married  Intimate Partner Violence: Not At Risk (03/31/2023)   Humiliation, Afraid, Rape, and Kick questionnaire    Fear of Current or Ex-Partner: No    Emotionally Abused: No    Physically Abused: No    Sexually  Abused: No    Physical Exam: Vital signs in last 24 hours: BP (!) 149/99   Pulse 78   Temp (!) 97.4 F (36.3 C)   Ht 5' 4 (1.626 m)   Wt 215 lb (97.5 kg)   SpO2 100%   BMI 36.90 kg/m  GEN: NAD EYE: Sclerae anicteric ENT: MMM CV: Non-tachycardic Pulm: No increased WOB GI: Soft NEURO:  Alert & Oriented   Estefana Kidney, MD [facility]   02/25/2024 2:01 PM

## 2024-02-26 ENCOUNTER — Telehealth: Payer: Self-pay | Admitting: *Deleted

## 2024-02-26 NOTE — Telephone Encounter (Signed)
  Follow up Call-     02/25/2024    1:55 PM  Call back number  Post procedure Call Back phone  # [phone]  Permission to leave phone message Yes     Patient questions:  Do you have a fever, pain , or abdominal swelling? No. Pain Score  0 *  Have you tolerated food without any problems? Yes.    Have you been able to return to your normal activities? Yes.    Do you have any questions about your discharge instructions: Diet   No. Medications  No. Follow up visit  No.  Do you have questions or concerns about your Care? No.  Actions: * If pain score is 4 or above: No action needed, pain <4.

## 2024-03-01 ENCOUNTER — Ambulatory Visit: Payer: Self-pay | Admitting: Internal Medicine

## 2024-03-01 LAB — SURGICAL PATHOLOGY

## 2024-03-16 ENCOUNTER — Other Ambulatory Visit: Payer: Self-pay | Admitting: Emergency Medicine

## 2024-03-16 DIAGNOSIS — M797 Fibromyalgia: Secondary | ICD-10-CM

## 2024-03-16 DIAGNOSIS — M62838 Other muscle spasm: Secondary | ICD-10-CM

## 2024-03-16 DIAGNOSIS — F32A Depression, unspecified: Secondary | ICD-10-CM

## 2024-03-17 ENCOUNTER — Other Ambulatory Visit (HOSPITAL_COMMUNITY): Payer: Self-pay

## 2024-03-17 ENCOUNTER — Other Ambulatory Visit: Payer: Self-pay

## 2024-03-17 MED ORDER — CYCLOBENZAPRINE HCL 10 MG PO TABS
10.0000 mg | ORAL_TABLET | Freq: Three times a day (TID) | ORAL | 1 refills | Status: AC | PRN
Start: 2024-03-17 — End: ?
  Filled 2024-03-17: qty 180, 60d supply, fill #0
  Filled 2024-06-13: qty 180, 60d supply, fill #1

## 2024-03-17 MED ORDER — DULOXETINE HCL 60 MG PO CPEP
60.0000 mg | ORAL_CAPSULE | Freq: Every day | ORAL | 1 refills | Status: AC
  Filled 2024-03-17: qty 90, 90d supply, fill #0
  Filled 2024-06-30: qty 90, 90d supply, fill #1

## 2024-03-31 ENCOUNTER — Ambulatory Visit: Payer: Medicare PPO

## 2024-04-06 ENCOUNTER — Other Ambulatory Visit: Payer: Self-pay | Admitting: Emergency Medicine

## 2024-04-06 ENCOUNTER — Other Ambulatory Visit: Payer: Self-pay

## 2024-04-06 ENCOUNTER — Other Ambulatory Visit (HOSPITAL_COMMUNITY): Payer: Self-pay

## 2024-04-06 DIAGNOSIS — F5104 Psychophysiologic insomnia: Secondary | ICD-10-CM

## 2024-04-06 DIAGNOSIS — G43909 Migraine, unspecified, not intractable, without status migrainosus: Secondary | ICD-10-CM

## 2024-04-06 DIAGNOSIS — F411 Generalized anxiety disorder: Secondary | ICD-10-CM

## 2024-04-06 DIAGNOSIS — J309 Allergic rhinitis, unspecified: Secondary | ICD-10-CM

## 2024-04-06 MED ORDER — ZOLPIDEM TARTRATE 5 MG PO TABS
5.0000 mg | ORAL_TABLET | Freq: Every evening | ORAL | 1 refills | Status: AC | PRN
  Filled 2024-04-06: qty 15, 15d supply, fill #0

## 2024-04-06 MED ORDER — FLUTICASONE PROPIONATE 50 MCG/ACT NA SUSP
1.0000 | Freq: Every day | NASAL | 6 refills | Status: AC
  Filled 2024-04-06: qty 16, 60d supply, fill #0
  Filled 2024-06-30: qty 16, 60d supply, fill #1
  Filled 2024-08-22: qty 16, 60d supply, fill #0

## 2024-04-06 MED ORDER — BUSPIRONE HCL 7.5 MG PO TABS
7.5000 mg | ORAL_TABLET | Freq: Two times a day (BID) | ORAL | 1 refills | Status: AC
  Filled 2024-04-06: qty 180, 90d supply, fill #0
  Filled 2024-06-30: qty 180, 90d supply, fill #1

## 2024-04-06 MED ORDER — TOPIRAMATE 50 MG PO TABS
50.0000 mg | ORAL_TABLET | Freq: Two times a day (BID) | ORAL | 1 refills | Status: AC
  Filled 2024-04-06: qty 180, 90d supply, fill #0
  Filled 2024-06-10 – 2024-06-30 (×2): qty 180, 90d supply, fill #1

## 2024-04-07 ENCOUNTER — Encounter: Payer: Self-pay | Admitting: Cardiology

## 2024-04-07 ENCOUNTER — Other Ambulatory Visit (HOSPITAL_COMMUNITY): Payer: Self-pay

## 2024-04-07 MED ORDER — METOPROLOL SUCCINATE ER 25 MG PO TB24
25.0000 mg | ORAL_TABLET | Freq: Every day | ORAL | 2 refills | Status: AC
  Filled 2024-04-07: qty 90, 90d supply, fill #0
  Filled 2024-06-30: qty 90, 90d supply, fill #1
  Filled 2024-08-22: qty 90, 90d supply, fill #0

## 2024-04-07 MED ORDER — POTASSIUM CHLORIDE CRYS ER 20 MEQ PO TBCR
20.0000 meq | EXTENDED_RELEASE_TABLET | Freq: Every day | ORAL | 2 refills | Status: AC
  Filled 2024-04-07: qty 90, 90d supply, fill #0

## 2024-04-12 ENCOUNTER — Other Ambulatory Visit (HOSPITAL_COMMUNITY): Payer: Self-pay

## 2024-04-12 ENCOUNTER — Telehealth: Admitting: Physician Assistant

## 2024-04-12 DIAGNOSIS — U071 COVID-19: Secondary | ICD-10-CM | POA: Diagnosis not present

## 2024-04-12 MED ORDER — BENZONATATE 100 MG PO CAPS
100.0000 mg | ORAL_CAPSULE | Freq: Three times a day (TID) | ORAL | 0 refills | Status: AC | PRN
  Filled 2024-04-12: qty 30, 10d supply, fill #0

## 2024-04-12 MED ORDER — NIRMATRELVIR/RITONAVIR (PAXLOVID) TABLET (RENAL DOSING)
2.0000 | ORAL_TABLET | Freq: Two times a day (BID) | ORAL | 0 refills | Status: AC
  Filled 2024-04-12: qty 20, 5d supply, fill #0

## 2024-04-12 NOTE — Patient Instructions (Addendum)
 Camie SHAUNNA Shams, thank you for joining Elsie Velma Lunger, PA-C for today's virtual visit.  While this provider is not your primary care provider (PCP), if your PCP is located in our provider database this encounter information will be shared with them immediately following your visit.   A [facility] MyChart account gives you access to today's visit and all your visits, tests, and labs performed at [facility]  click here if you don't have a [facility] MyChart account or go to [email or web address]  Consent: (Patient) [name] provided verbal consent for this virtual visit at the beginning of the encounter.  Current Medications:  Current Outpatient Medications:    albuterol  (PROAIR  HFA) 108 (90 Base) MCG/ACT inhaler, Inhale 1-2 puffs into the lungs every 4 (four) hours as needed for wheezing or shortness of breath., Disp: 18 g, Rfl: 0   atorvastatin  (LIPITOR) 20 MG tablet, Take 1 tablet (20 mg total) by mouth daily., Disp: 90 tablet, Rfl: 3   benzonatate  (TESSALON ) 100 MG capsule, Take 1 capsule (100 mg total) by mouth 2 (two) times daily as needed for cough. (Patient not taking: Reported on 02/25/2024), Disp: 20 capsule, Rfl: 0   busPIRone  (BUSPAR ) 7.5 MG tablet, Take 1 tablet (7.5 mg total) by mouth 2 (two) times daily., Disp: 180 tablet, Rfl: 1   butalbital -acetaminophen -caffeine  (FIORICET ) 50-325-40 MG tablet, Take 1 tablet by mouth every 6 (six) hours as needed for headache., Disp: 20 tablet, Rfl: 0   cyclobenzaprine  (FLEXERIL ) 10 MG tablet, Take 1 tablet (10 mg total) by mouth 3 (three) times daily as needed for muscle spasms., Disp: 180 tablet, Rfl: 1   DULoxetine  (CYMBALTA ) 60 MG capsule, Take 1 capsule (60 mg total) by mouth daily., Disp: 90 capsule, Rfl: 1   fluticasone  (FLONASE ) 50 MCG/ACT nasal spray, Place 1 spray into both nostrils daily., Disp: 16 g, Rfl: 6   furosemide  (LASIX ) 20 MG tablet, Take 1 tablet (20 mg total) by mouth daily., Disp: 90 tablet,  Rfl: 1   gabapentin  (NEURONTIN ) 300 MG capsule, Take 1-2 capsules (300-600 mg total) by mouth 4 (four) times daily., Disp: 540 capsule, Rfl: 1   linaclotide  (LINZESS ) 72 MCG capsule, Take 1 capsule (72 mcg total) by mouth daily before breakfast., Disp: 30 capsule, Rfl: 2   lisinopril  (ZESTRIL ) 20 MG tablet, Take 1 tablet (20 mg total) by mouth daily., Disp: 90 tablet, Rfl: 1   metoprolol  succinate (TOPROL  XL) 25 MG 24 hr tablet, Take 1 tablet (25 mg total) by mouth daily., Disp: 90 tablet, Rfl: 2   ondansetron  (ZOFRAN -ODT) 4 MG disintegrating tablet, Dissolve 1 tablet (4 mg total) in mouth every 8 (eight) hours as needed for nausea or vomiting., Disp: 20 tablet, Rfl: 4   pantoprazole  (PROTONIX ) 40 MG tablet, Take 1 tablet (40 mg total) by mouth 2 (two) times daily before a meal. Take twice daily for 10 weeks then once daily, Disp: 180 tablet, Rfl: 3   potassium chloride  SA (KLOR-CON  M) 20 MEQ tablet, Take 1 tablet (20 mEq total) by mouth daily., Disp: 90 tablet, Rfl: 2   sodium fluoride  (SODIUM FLUORIDE  5000 PPM) 1.1 % GEL dental gel, Use before bed.  Do not rinse (Patient not taking: Reported on 02/25/2024), Disp: 100 mL, Rfl: 3   topiramate  (TOPAMAX ) 50 MG tablet, Take 1 tablet (50 mg total) by mouth 2 (two) times daily., Disp: 180 tablet, Rfl: 1   vortioxetine  HBr (TRINTELLIX ) 10 MG TABS tablet, Take 1 tablet (10 mg total) by mouth daily.,  Disp: 90 tablet, Rfl: 1   zolpidem  (AMBIEN ) 5 MG tablet, Take 1 tablet (5 mg total) by mouth at bedtime as needed for sleep., Disp: 15 tablet, Rfl: 1   Medications ordered in this encounter:  No orders of the defined types were placed in this encounter.    *If you need refills on other medications prior to your next appointment, please contact your pharmacy*  Follow-Up: Call back or seek an in-person evaluation if the symptoms worsen or if the condition fails to improve as anticipated.  [facility] [phone]  Care Instructions: Please  keep well-hydrated and get plenty of rest. Start a saline nasal rinse to flush out your nasal passages. You can use plain Mucinex  to help thin congestion. Hold the Atorvastatin  while taking the Paxlovid .  If you have a humidifier, running in the bedroom at night. Please take prescribed medications as directed.   Isolation Instructions: You are to isolate at home until you have been fever free for at least 24 hours without a fever-reducing medication, and symptoms have been steadily improving for 24 hours. At that time,  you can end isolation but need to mask for an additional 5 days.   If you must be around other household members who do not have symptoms, you need to make sure that both you and the family members are masking consistently with a high-quality mask.  If you note any worsening of symptoms despite treatment, please seek an in-person evaluation ASAP. If you note any significant shortness of breath or any chest pain, please seek ER evaluation. Please do not delay care!   COVID-19: What to Do if You Are Sick If you test positive and are an older adult or someone who is at high risk of getting very sick from COVID-19, treatment may be available. Contact a healthcare provider right away after a positive test to determine if you are eligible, even if your symptoms are mild right now. You can also visit a Test to Treat location and, if eligible, receive a prescription from a provider. Don't delay: Treatment must be started within the first few days to be effective. If you have a fever, cough, or other symptoms, you might have COVID-19. Most people have mild illness and are able to recover at home. If you are sick: Keep track of your symptoms. If you have an emergency warning sign (including trouble breathing), call 911. Steps to help prevent the spread of COVID-19 if you are sick If you are sick with COVID-19 or think you might have COVID-19, follow the steps below to care for yourself  and to help protect other people in your home and community. Stay home except to get medical care Stay home. Most people with COVID-19 have mild illness and can recover at home without medical care. Do not leave your home, except to get medical care. Do not visit public areas and do not go to places where you are unable to wear a mask. Take care of yourself. Get rest and stay hydrated. Take over-the-counter medicines, such as acetaminophen , to help you feel better. Stay in touch with your doctor. Call before you get medical care. Be sure to get care if you have trouble breathing, or have any other emergency warning signs, or if you think it is an emergency. Avoid public transportation, ride-sharing, or taxis if possible. Get tested If you have symptoms of COVID-19, get tested. While waiting for test results, stay away from others, including staying apart from  those living in your household. Get tested as soon as possible after your symptoms start. Treatments may be available for people with COVID-19 who are at risk for becoming very sick. Don't delay: Treatment must be started early to be effective--some treatments must begin within 5 days of your first symptoms. Contact your healthcare provider right away if your test result is positive to determine if you are eligible. Self-tests are one of several options for testing for the virus that causes COVID-19 and may be more convenient than laboratory-based tests and point-of-care tests. Ask your healthcare provider or your local health department if you need help interpreting your test results. You can visit your state, tribal, local, and territorial health department's website to look for the latest local information on testing sites. Separate yourself from other people As much as possible, stay in a specific room and away from other people and pets in your home. If possible, you should use a separate bathroom. If you need to be around other people or  animals in or outside of the home, wear a well-fitting mask. Tell your close contacts that they may have been exposed to COVID-19. An infected person can spread COVID-19 starting 48 hours (or 2 days) before the person has any symptoms or tests positive. By letting your close contacts know they may have been exposed to COVID-19, you are helping to protect everyone. See COVID-19 and Animals if you have questions about pets. If you are diagnosed with COVID-19, someone from the health department may call you. Answer the call to slow the spread. Monitor your symptoms Symptoms of COVID-19 include fever, cough, or other symptoms. Follow care instructions from your healthcare provider and local health department. Your local health authorities may give instructions on checking your symptoms and reporting information. When to seek emergency medical attention Look for emergency warning signs* for COVID-19. If someone is showing any of these signs, seek emergency medical care immediately: Trouble breathing Persistent pain or pressure in the chest New confusion Inability to wake or stay awake Pale, gray, or blue-colored skin, lips, or nail beds, depending on skin tone *This list is not all possible symptoms. Please call your medical provider for any other symptoms that are severe or concerning to you. Call 911 or call ahead to your local emergency facility: Notify the operator that you are seeking care for someone who has or may have COVID-19. Call ahead before visiting your doctor Call ahead. Many medical visits for routine care are being postponed or done by phone or telemedicine. If you have a medical appointment that cannot be postponed, call your doctor's office, and tell them you have or may have COVID-19. This will help the office protect themselves and other patients. If you are sick, wear a well-fitting mask You should wear a mask if you must be around other people or animals, including pets (even at  home). Wear a mask with the best fit, protection, and comfort for you. You don't need to wear the mask if you are alone. If you can't put on a mask (because of trouble breathing, for example), cover your coughs and sneezes in some other way. Try to stay at least 6 feet away from other people. This will help protect the people around you. Masks should not be placed on young children under age 36 years, anyone who has trouble breathing, or anyone who is not able to remove the mask without help. Cover your coughs and sneezes Cover your mouth and nose with a  tissue when you cough or sneeze. Throw away used tissues in a lined trash can. Immediately wash your hands with soap and water for at least 20 seconds. If soap and water are not available, clean your hands with an alcohol-based hand sanitizer that contains at least 60% alcohol. Clean your hands often Wash your hands often with soap and water for at least 20 seconds. This is especially important after blowing your nose, coughing, or sneezing; going to the bathroom; and before eating or preparing food. Use hand sanitizer if soap and water are not available. Use an alcohol-based hand sanitizer with at least 60% alcohol, covering all surfaces of your hands and rubbing them together until they feel dry. Soap and water are the best option, especially if hands are visibly dirty. Avoid touching your eyes, nose, and mouth with unwashed hands. Handwashing Tips Avoid sharing personal household items Do not share dishes, drinking glasses, cups, eating utensils, towels, or bedding with other people in your home. Wash these items thoroughly after using them with soap and water or put in the dishwasher. Clean surfaces in your home regularly Clean and disinfect high-touch surfaces (for example, doorknobs, tables, handles, light switches, and countertops) in your sick room and bathroom. In shared spaces, you should clean and disinfect surfaces and items after each  use by the person who is ill. If you are sick and cannot clean, a caregiver or other person should only clean and disinfect the area around you (such as your bedroom and bathroom) on an as needed basis. Your caregiver/other person should wait as long as possible (at least several hours) and wear a mask before entering, cleaning, and disinfecting shared spaces that you use. Clean and disinfect areas that may have blood, stool, or body fluids on them. Use household cleaners and disinfectants. Clean visible dirty surfaces with household cleaners containing soap or detergent. Then, use a household disinfectant. Use a product from [employer] List N: Disinfectants for Coronavirus (COVID-19). Be sure to follow the instructions on the label to ensure safe and effective use of the product. Many products recommend keeping the surface wet with a disinfectant for a certain period of time (look at contact time on the product label). You may also need to wear personal protective equipment, such as gloves, depending on the directions on the product label. Immediately after disinfecting, wash your hands with soap and water for 20 seconds. For completed guidance on cleaning and disinfecting your home, visit Complete Disinfection Guidance. Take steps to improve ventilation at home Improve ventilation (air flow) at home to help prevent from spreading COVID-19 to other people in your household. Clear out COVID-19 virus particles in the air by opening windows, using air filters, and turning on fans in your home. Use this interactive tool to learn how to improve air flow in your home. When you can be around others after being sick with COVID-19 Deciding when you can be around others is different for different situations. Find out when you can safely end home isolation. For any additional questions about your care, contact your healthcare provider or state or local health department. 10/23/2020 Content source: [facility] ([facility]), Division of Viral Diseases This information is not intended to replace advice given to you by your health care provider. Make sure you discuss any questions you have with your health care provider. Document Revised: 12/06/2020 Document Reviewed: 12/06/2020 Elsevier Patient Education  2022 [employer].   If you have been instructed to  have an in-person evaluation today at a local Urgent Care facility, please use the link below. It will take you to a list of all of our available [facility], including address, phone number and hours of operation. Please do not delay care.  [facility]  If you or a family member do not have a primary care provider, use the link below to schedule a visit and establish care. When you choose a [facility] primary care physician or advanced practice provider, you gain a long-term partner in health. Find a Primary Care Provider  Learn more about [facility]'s in-office and virtual care options: [facility] - Get Care Now

## 2024-04-12 NOTE — Progress Notes (Signed)
 Virtual Visit Consent   [name], you are scheduled for a virtual visit with a [facility] provider today. Just as with appointments in the office, your consent must be obtained to participate. Your consent will be active for this visit and any virtual visit you may have with one of our providers in the next 365 days. If you have a MyChart account, a copy of this consent can be sent to you electronically.  As this is a virtual visit, video technology does not allow for your provider to perform a traditional examination. This may limit your provider's ability to fully assess your condition. If your provider identifies any concerns that need to be evaluated in person or the need to arrange testing (such as labs, EKG, etc.), we will make arrangements to do so. Although advances in technology are sophisticated, we cannot ensure that it will always work on either your end or our end. If the connection with a video visit is poor, the visit may have to be switched to a telephone visit. With either a video or telephone visit, we are not always able to ensure that we have a secure connection.  By engaging in this virtual visit, you consent to the provision of healthcare and authorize for your insurance to be billed (if applicable) for the services provided during this visit. Depending on your insurance coverage, you may receive a charge related to this service.  I need to obtain your verbal consent now. Are you willing to proceed with your visit today? [name] has provided verbal consent on 04/12/2024 for a virtual visit (video or telephone). Elsie Velma Lunger, [place]  Date: 04/12/2024 2:07 PM   Virtual Visit via Video Note   I, Elsie Velma Lunger, connected with  [name]  ([id number], Sep 15, 1955) on 04/12/24 at  2:45 PM EDT by a video-enabled telemedicine application and verified that I am speaking with the correct person using two identifiers.  Location: Patient: Virtual Visit Location  Patient: Home Provider: Virtual Visit Location Provider: Home Office   I discussed the limitations of evaluation and management by telemedicine and the availability of in person appointments. The patient expressed understanding and agreed to proceed.    History of Present Illness: [name] is a 68 y.o. who identifies as a female who was assigned female at birth, and is being seen today for COVID-19. Endorses symptoms starting Saturday with sore throat, cough, headache, body aches, laryngitis (resolved). Notes her daughter tested positive for COVID so she tested as a precaution and this came back positive. Her husband is also testing positive now. Notes mild SOB with exertion only. Has history of HF, currently on BB, ACE and Furosemide . Denies leg swelling, chest pain, weight changes.  HPI: HPI  Problems:  Patient Active Problem List   Diagnosis Date Noted   Symptomatic anemia 10/08/2023   Postviral fatigue syndrome 10/22/2022   Nausea without vomiting 03/26/2022   Prediabetes 11/18/2021   Major depression, recurrent, chronic (HCC) 10/01/2021   Hx of adenomatous colonic polyps 01/02/2021   Pulmonary arterial hypertension (HCC) 07/04/2020   Kidney cysts 07/04/2020   Hyperlipidemia 07/04/2020   Depression 07/04/2020   Iron  deficiency anemia 10/13/2018   DOE (dyspnea on exertion) 03/23/2018   Obesity, morbid, BMI 40.0-49.9 (HCC) 03/23/2018   Chronic diastolic heart failure (HCC) 03/23/2018   Grade I diastolic dysfunction 07/20/2017   Migraine without aura and without status migrainosus, not intractable 03/02/2017   Class 3 severe obesity due to excess  calories with serious comorbidity and body mass index (BMI) of 40.0 to 44.9 in adult 01/13/2017   Hiatal hernia 10/28/2016   Pure hypercholesterolemia 10/28/2016   Chronic kidney disease 06/01/2015   Gastroesophageal reflux disease 06/01/2015   Goiter 06/01/2015   Essential hypertension 06/01/2015   Chronic pain syndrome 05/28/2015    Fibromyalgia 05/28/2015   OSA (obstructive sleep apnea) 05/28/2015   Adjustment disorder with mixed anxiety and depressed mood 05/28/2015    Allergies:  Allergies  Allergen Reactions   Sulfa Antibiotics Itching   Medications:  Current Outpatient Medications:    benzonatate  (TESSALON ) 100 MG capsule, Take 1 capsule (100 mg total) by mouth 3 (three) times daily as needed for cough., Disp: 30 capsule, Rfl: 0   nirmatrelvir /ritonavir , renal dosing, (PAXLOVID ) 10 x 150 MG & 10 x 100MG  TABS, Take 2 tablets by mouth 2 (two) times daily for 5 days. (Take nirmatrelvir  150 mg one tablet twice daily for 5 days and ritonavir  100 mg one tablet twice daily for 5 days) Patient GFR is 59, Disp: 20 tablet, Rfl: 0   albuterol  (PROAIR  HFA) 108 (90 Base) MCG/ACT inhaler, Inhale 1-2 puffs into the lungs every 4 (four) hours as needed for wheezing or shortness of breath., Disp: 18 g, Rfl: 0   atorvastatin  (LIPITOR) 20 MG tablet, Take 1 tablet (20 mg total) by mouth daily., Disp: 90 tablet, Rfl: 3   busPIRone  (BUSPAR ) 7.5 MG tablet, Take 1 tablet (7.5 mg total) by mouth 2 (two) times daily., Disp: 180 tablet, Rfl: 1   butalbital -acetaminophen -caffeine  (FIORICET ) 50-325-40 MG tablet, Take 1 tablet by mouth every 6 (six) hours as needed for headache., Disp: 20 tablet, Rfl: 0   cyclobenzaprine  (FLEXERIL ) 10 MG tablet, Take 1 tablet (10 mg total) by mouth 3 (three) times daily as needed for muscle spasms., Disp: 180 tablet, Rfl: 1   DULoxetine  (CYMBALTA ) 60 MG capsule, Take 1 capsule (60 mg total) by mouth daily., Disp: 90 capsule, Rfl: 1   fluticasone  (FLONASE ) 50 MCG/ACT nasal spray, Place 1 spray into both nostrils daily., Disp: 16 g, Rfl: 6   furosemide  (LASIX ) 20 MG tablet, Take 1 tablet (20 mg total) by mouth daily., Disp: 90 tablet, Rfl: 1   gabapentin  (NEURONTIN ) 300 MG capsule, Take 1-2 capsules (300-600 mg total) by mouth 4 (four) times daily., Disp: 540 capsule, Rfl: 1   linaclotide  (LINZESS ) 72 MCG  capsule, Take 1 capsule (72 mcg total) by mouth daily before breakfast., Disp: 30 capsule, Rfl: 2   lisinopril  (ZESTRIL ) 20 MG tablet, Take 1 tablet (20 mg total) by mouth daily., Disp: 90 tablet, Rfl: 1   metoprolol  succinate (TOPROL  XL) 25 MG 24 hr tablet, Take 1 tablet (25 mg total) by mouth daily., Disp: 90 tablet, Rfl: 2   ondansetron  (ZOFRAN -ODT) 4 MG disintegrating tablet, Dissolve 1 tablet (4 mg total) in mouth every 8 (eight) hours as needed for nausea or vomiting., Disp: 20 tablet, Rfl: 4   pantoprazole  (PROTONIX ) 40 MG tablet, Take 1 tablet (40 mg total) by mouth 2 (two) times daily before a meal. Take twice daily for 10 weeks then once daily, Disp: 180 tablet, Rfl: 3   potassium chloride  SA (KLOR-CON  M) 20 MEQ tablet, Take 1 tablet (20 mEq total) by mouth daily., Disp: 90 tablet, Rfl: 2   sodium fluoride  (SODIUM FLUORIDE  5000 PPM) 1.1 % GEL dental gel, Use before bed.  Do not rinse (Patient not taking: Reported on 02/25/2024), Disp: 100 mL, Rfl: 3   topiramate  (TOPAMAX ) 50 MG  tablet, Take 1 tablet (50 mg total) by mouth 2 (two) times daily., Disp: 180 tablet, Rfl: 1   vortioxetine  HBr (TRINTELLIX ) 10 MG TABS tablet, Take 1 tablet (10 mg total) by mouth daily., Disp: 90 tablet, Rfl: 1   zolpidem  (AMBIEN ) 5 MG tablet, Take 1 tablet (5 mg total) by mouth at bedtime as needed for sleep., Disp: 15 tablet, Rfl: 1  Observations/Objective: Patient is well-developed, well-nourished in no acute distress.  Resting comfortably at home.  Head is normocephalic, atraumatic.  No labored breathing. Speech is clear and coherent with logical content.  Patient is alert and oriented at baseline.   Assessment and Plan: 1. COVID-19 (Primary) - benzonatate  (TESSALON ) 100 MG capsule; Take 1 capsule (100 mg total) by mouth 3 (three) times daily as needed for cough.  Dispense: 30 capsule; Refill: 0 - nirmatrelvir /ritonavir , renal dosing, (PAXLOVID ) 10 x 150 MG & 10 x 100MG  TABS; Take 2 tablets by mouth 2 (two)  times daily for 5 days. (Take nirmatrelvir  150 mg one tablet twice daily for 5 days and ritonavir  100 mg one tablet twice daily for 5 days) Patient GFR is 59  Dispense: 20 tablet; Refill: 0  Patient with multiple risk factors for complicated course of illness. Discussed risks/benefits of antiviral medications including most common potential ADRs. Patient voiced understanding and would like to proceed with antiviral medication. They are candidate for Paxlovid  (renally dosed). Rx sent to pharmacy. Supportive measures, OTC medications and vitamin regimen reviewed. Tessalon  per orders. Quarantine reviewed in detail. Strict ER precautions discussed with patient.    Follow Up Instructions: I discussed the assessment and treatment plan with the patient. The patient was provided an opportunity to ask questions and all were answered. The patient agreed with the plan and demonstrated an understanding of the instructions.  A copy of instructions were sent to the patient via MyChart unless otherwise noted below.   The patient was advised to call back or seek an in-person evaluation if the symptoms worsen or if the condition fails to improve as anticipated.    Elsie Velma Lunger, PA-C

## 2024-04-27 ENCOUNTER — Encounter: Payer: Self-pay | Admitting: Emergency Medicine

## 2024-04-27 ENCOUNTER — Ambulatory Visit: Admitting: Emergency Medicine

## 2024-04-27 ENCOUNTER — Encounter: Payer: Self-pay | Admitting: Hematology and Oncology

## 2024-04-27 ENCOUNTER — Other Ambulatory Visit (HOSPITAL_COMMUNITY): Payer: Self-pay

## 2024-04-27 VITALS — BP 130/88 | HR 70 | Temp 98.4°F | Ht 64.0 in | Wt 212.0 lb

## 2024-04-27 DIAGNOSIS — R7303 Prediabetes: Secondary | ICD-10-CM | POA: Diagnosis not present

## 2024-04-27 DIAGNOSIS — K219 Gastro-esophageal reflux disease without esophagitis: Secondary | ICD-10-CM | POA: Diagnosis not present

## 2024-04-27 DIAGNOSIS — N1831 Chronic kidney disease, stage 3a: Secondary | ICD-10-CM

## 2024-04-27 DIAGNOSIS — I2721 Secondary pulmonary arterial hypertension: Secondary | ICD-10-CM

## 2024-04-27 DIAGNOSIS — E66812 Obesity, class 2: Secondary | ICD-10-CM

## 2024-04-27 DIAGNOSIS — F4323 Adjustment disorder with mixed anxiety and depressed mood: Secondary | ICD-10-CM | POA: Diagnosis not present

## 2024-04-27 DIAGNOSIS — G894 Chronic pain syndrome: Secondary | ICD-10-CM | POA: Diagnosis not present

## 2024-04-27 DIAGNOSIS — I5032 Chronic diastolic (congestive) heart failure: Secondary | ICD-10-CM | POA: Diagnosis not present

## 2024-04-27 DIAGNOSIS — I10 Essential (primary) hypertension: Secondary | ICD-10-CM

## 2024-04-27 DIAGNOSIS — G9331 Postviral fatigue syndrome: Secondary | ICD-10-CM

## 2024-04-27 DIAGNOSIS — Z9189 Other specified personal risk factors, not elsewhere classified: Secondary | ICD-10-CM

## 2024-04-27 DIAGNOSIS — F331 Major depressive disorder, recurrent, moderate: Secondary | ICD-10-CM

## 2024-04-27 DIAGNOSIS — G4733 Obstructive sleep apnea (adult) (pediatric): Secondary | ICD-10-CM

## 2024-04-27 DIAGNOSIS — Z6836 Body mass index (BMI) 36.0-36.9, adult: Secondary | ICD-10-CM

## 2024-04-27 DIAGNOSIS — M21611 Bunion of right foot: Secondary | ICD-10-CM

## 2024-04-27 MED ORDER — WEGOVY 0.25 MG/0.5ML ~~LOC~~ SOAJ
0.2500 mg | SUBCUTANEOUS | 3 refills | Status: DC
  Filled 2024-04-27: qty 2, 28d supply, fill #0

## 2024-04-27 MED ORDER — EMPAGLIFLOZIN 10 MG PO TABS
10.0000 mg | ORAL_TABLET | Freq: Every day | ORAL | 3 refills | Status: AC
  Filled 2024-04-27 – 2024-08-22 (×4): qty 90, 90d supply, fill #0

## 2024-04-27 NOTE — Assessment & Plan Note (Signed)
 BP Readings from Last 3 Encounters:  04/27/24 130/88  02/25/24 136/73  01/27/24 134/80  Well-controlled on current dose of lisinopril  20 mg daily and Toprol  25 mg daily.  Cardiovascular risks associated with hypertension discussed Scheduled to follow-up with cardiologist next November

## 2024-04-27 NOTE — Assessment & Plan Note (Signed)
Stable.  On Cymbalta and gabapentin.

## 2024-04-27 NOTE — Assessment & Plan Note (Signed)
 Suggestion of dilated PA on Coronary Calcium Score, but not confirmed by echo both in 2020 and 2025.

## 2024-04-27 NOTE — Assessment & Plan Note (Signed)
 Stable chronic condition. Advised to stay well-hydrated and avoid NSAIDs Would benefit from Farxiga or Jardiance 

## 2024-04-27 NOTE — Patient Instructions (Signed)
 Health Maintenance After Age 68 After age 27, you are at a higher risk for certain long-term diseases and infections as well as injuries from falls. Falls are a major cause of broken bones and head injuries in people who are older than age 73. Getting regular preventive care can help to keep you healthy and well. Preventive care includes getting regular testing and making lifestyle changes as recommended by your health care provider. Talk with your health care provider about: Which screenings and tests you should have. A screening is a test that checks for a disease when you have no symptoms. A diet and exercise plan that is right for you. What should I know about screenings and tests to prevent falls? Screening and testing are the best ways to find a health problem early. Early diagnosis and treatment give you the best chance of managing medical conditions that are common after age 90. Certain conditions and lifestyle choices may make you more likely to have a fall. Your health care provider may recommend: Regular vision checks. Poor vision and conditions such as cataracts can make you more likely to have a fall. If you wear glasses, make sure to get your prescription updated if your vision changes. Medicine review. Work with your health care provider to regularly review all of the medicines you are taking, including over-the-counter medicines. Ask your health care provider about any side effects that may make you more likely to have a fall. Tell your health care provider if any medicines that you take make you feel dizzy or sleepy. Strength and balance checks. Your health care provider may recommend certain tests to check your strength and balance while standing, walking, or changing positions. Foot health exam. Foot pain and numbness, as well as not wearing proper footwear, can make you more likely to have a fall. Screenings, including: Osteoporosis screening. Osteoporosis is a condition that causes  the bones to get weaker and break more easily. Blood pressure screening. Blood pressure changes and medicines to control blood pressure can make you feel dizzy. Depression screening. You may be more likely to have a fall if you have a fear of falling, feel depressed, or feel unable to do activities that you used to do. Alcohol  use screening. Using too much alcohol  can affect your balance and may make you more likely to have a fall. Follow these instructions at home: Lifestyle Do not drink alcohol  if: Your health care provider tells you not to drink. If you drink alcohol : Limit how much you have to: 0-1 drink a day for women. 0-2 drinks a day for men. Know how much alcohol  is in your drink. In the [place] one drink equals one 12 oz bottle of beer (355 mL), one 5 oz glass of wine (148 mL), or one 1 oz glass of hard liquor (44 mL). Do not use any products that contain nicotine or tobacco. These products include cigarettes, chewing tobacco, and vaping devices, such as e-cigarettes. If you need help quitting, ask your health care provider. Activity  Follow a regular exercise program to stay fit. This will help you maintain your balance. Ask your health care provider what types of exercise are appropriate for you. If you need a cane or walker, use it as recommended by your health care provider. Wear supportive shoes that have nonskid soles. Safety  Remove any tripping hazards, such as rugs, cords, and clutter. Install safety equipment such as grab bars in bathrooms and safety rails on stairs. Keep rooms and walkways  well-lit. General instructions Talk with your health care provider about your risks for falling. Tell your health care provider if: You fall. Be sure to tell your health care provider about all falls, even ones that seem minor. You feel dizzy, tiredness (fatigue), or off-balance. Take over-the-counter and prescription medicines only as told by your health care provider. These include  supplements. Eat a healthy diet and maintain a healthy weight. A healthy diet includes low-fat dairy products, low-fat (lean) meats, and fiber from whole grains, beans, and lots of fruits and vegetables. Stay current with your vaccines. Schedule regular health, dental, and eye exams. Summary Having a healthy lifestyle and getting preventive care can help to protect your health and wellness after age 15. Screening and testing are the best way to find a health problem early and help you avoid having a fall. Early diagnosis and treatment give you the best chance for managing medical conditions that are more common for people who are older than age 42. Falls are a major cause of broken bones and head injuries in people who are older than age 64. Take precautions to prevent a fall at home. Work with your health care provider to learn what changes you can make to improve your health and wellness and to prevent falls. This information is not intended to replace advice given to you by your health care provider. Make sure you discuss any questions you have with your health care provider. Document Revised: 12/10/2020 Document Reviewed: 12/10/2020 [employer]vier Patient Education  2024 [employer].

## 2024-04-27 NOTE — Assessment & Plan Note (Signed)
 Currently stable Takes Cymbalta  and Trintellix  Also uses BuSpar  7.5 mg twice a day as needed for anxiety

## 2024-04-27 NOTE — Assessment & Plan Note (Signed)
Stable.  Continue CPAP treatment.

## 2024-04-27 NOTE — Progress Notes (Signed)
 [name] 68 y.o.   Chief Complaint  Patient presents with   Follow-up    Patient here for 6 month f/u.    HISTORY OF PRESENT ILLNESS: This is a 68 y.o. female here for 3-month follow-up of multiple chronic medical conditions BP Readings from Last 3 Encounters:  02/25/24 136/73  01/27/24 134/80  11/23/23 118/64   Wt Readings from Last 3 Encounters:  02/25/24 215 lb (97.5 kg)  01/27/24 215 lb 6.4 oz (97.7 kg)  11/19/23 212 lb (96.2 kg)     HPI   Prior to Admission medications   Medication Sig Start Date End Date Taking? Authorizing Provider  albuterol  (PROAIR  HFA) 108 (90 Base) MCG/ACT inhaler Inhale 1-2 puffs into the lungs every 4 (four) hours as needed for wheezing or shortness of breath. 08/21/22   Moishe Chiquita HERO, NP  atorvastatin  (LIPITOR) 20 MG tablet Take 1 tablet (20 mg total) by mouth daily. 10/05/23   Princess Karnes Jose, MD  benzonatate  (TESSALON ) 100 MG capsule Take 1 capsule (100 mg total) by mouth 3 (three) times daily as needed for cough. 04/12/24   Gladis Elsie BROCKS, PA-C  busPIRone  (BUSPAR ) 7.5 MG tablet Take 1 tablet (7.5 mg total) by mouth 2 (two) times daily. 04/06/24   Melina Mosteller Jose, MD  butalbital -acetaminophen -caffeine  (FIORICET ) 50-325-40 MG tablet Take 1 tablet by mouth every 6 (six) hours as needed for headache. 02/20/23   Norleen Lynwood ORN, MD  cyclobenzaprine  (FLEXERIL ) 10 MG tablet Take 1 tablet (10 mg total) by mouth 3 (three) times daily as needed for muscle spasms. 03/17/24   Purcell Emil Schanz, MD  DULoxetine  (CYMBALTA ) 60 MG capsule Take 1 capsule (60 mg total) by mouth daily. 03/17/24   Purcell Emil Schanz, MD  fluticasone  (FLONASE ) 50 MCG/ACT nasal spray Place 1 spray into both nostrils daily. 04/06/24   Purcell Emil Schanz, MD  furosemide  (LASIX ) 20 MG tablet Take 1 tablet (20 mg total) by mouth daily. 10/16/23   Rana Lum CROME, NP  gabapentin  (NEURONTIN ) 300 MG capsule Take 1-2 capsules (300-600 mg total) by mouth 4 (four) times  daily. 01/14/24   Purcell Emil Schanz, MD  linaclotide  (LINZESS ) 72 MCG capsule Take 1 capsule (72 mcg total) by mouth daily before breakfast. 02/12/24   May, Deanna J, NP  lisinopril  (ZESTRIL ) 20 MG tablet Take 1 tablet (20 mg total) by mouth daily. 11/12/23   Vianca Bracher Jose, MD  metoprolol  succinate (TOPROL  XL) 25 MG 24 hr tablet Take 1 tablet (25 mg total) by mouth daily. 04/07/24   Anner Alm ORN, MD  ondansetron  (ZOFRAN -ODT) 4 MG disintegrating tablet Dissolve 1 tablet (4 mg total) in mouth every 8 (eight) hours as needed for nausea or vomiting. 01/05/24   Faatima Tench Jose, MD  pantoprazole  (PROTONIX ) 40 MG tablet Take 1 tablet (40 mg total) by mouth 2 (two) times daily before a meal. Take twice daily for 10 weeks then once daily 02/25/24   Federico Rosario BROCKS, MD  potassium chloride  SA (KLOR-CON  M) 20 MEQ tablet Take 1 tablet (20 mEq total) by mouth daily. 04/07/24   Anner Alm ORN, MD  topiramate  (TOPAMAX ) 50 MG tablet Take 1 tablet (50 mg total) by mouth 2 (two) times daily. 04/06/24   Purcell Emil Schanz, MD  vortioxetine  HBr (TRINTELLIX ) 10 MG TABS tablet Take 1 tablet (10 mg total) by mouth daily. 11/12/23   Purcell Emil Schanz, MD  zolpidem  (AMBIEN ) 5 MG tablet Take 1 tablet (5 mg total) by mouth at bedtime  as needed for sleep. 04/06/24   Purcell Emil Schanz, MD    Allergies  Allergen Reactions   Sulfa Antibiotics Itching    Patient Active Problem List   Diagnosis Date Noted   Stage 3a chronic kidney disease (HCC) 04/27/2024   Symptomatic anemia 10/08/2023   Postviral fatigue syndrome 10/22/2022   Prediabetes 11/18/2021   Moderate episode of recurrent major depressive disorder (HCC) 10/01/2021   Hx of adenomatous colonic polyps 01/02/2021   Pulmonary arterial hypertension (HCC) 07/04/2020   Kidney cysts 07/04/2020   Hyperlipidemia 07/04/2020   Depression 07/04/2020   Iron  deficiency anemia 10/13/2018   DOE (dyspnea on exertion) 03/23/2018   Obesity, morbid, BMI  40.0-49.9 (HCC) 03/23/2018   Chronic diastolic heart failure (HCC) 03/23/2018   Grade I diastolic dysfunction 07/20/2017   Migraine without aura and without status migrainosus, not intractable 03/02/2017   Class 3 severe obesity due to excess calories with serious comorbidity and body mass index (BMI) of 40.0 to 44.9 in adult 01/13/2017   Hiatal hernia 10/28/2016   Pure hypercholesterolemia 10/28/2016   Chronic kidney disease 06/01/2015   Gastroesophageal reflux disease 06/01/2015   Goiter 06/01/2015   Essential hypertension 06/01/2015   Chronic pain syndrome 05/28/2015   Fibromyalgia 05/28/2015   OSA (obstructive sleep apnea) 05/28/2015   Adjustment disorder with mixed anxiety and depressed mood 05/28/2015    Past Medical History:  Diagnosis Date   Allergy    [name], Flonase    Anemia    Anxiety    Arthritis    Chronic kidney disease    Colon polyps    Depression    Fibromyalgia    Gastritis    GERD (gastroesophageal reflux disease)    Hernia, hiatal    High risk for colon cancer    HLD (hyperlipidemia)    Hypertension    Migraine    Neuropathy    Osteoporosis    Pneumonia    Sleep apnea    c-pap nightly   Sleep apnea with use of continuous positive airway pressure (CPAP)    Thyroid  goiter     Past Surgical History:  Procedure Laterality Date   48-Hour Holter Monitor  06/2017   Mostly normal sinus rhythm.  Average heart rate 71 bpm.  Minimum 54 bpm.  Maximum sinus rate 114 bpm.  Rare PACs and PVCs.  No A. fib, atrial flutter, SVT or VT.  One brief run of 8 beat PAT.   ABDOMINAL HYSTERECTOMY     ovaries intact; DUB.  No dysplasia.   FRACTURE SURGERY N/A    [employer] 09/25/2020   JOINT REPLACEMENT     OPEN REDUCTION INTERNAL FIXATION (ORIF) DISTAL RADIAL FRACTURE Right 07/09/2017   Procedure: RIGHT WRIST OPEN REDUCTION INTERNAL FIXATION (ORIF) DISTAL RADIAL FRACTURE AND REPAIR AS INDICATED;  Surgeon: Shari Easter, MD;  Location: [facility] OR;  Service: Orthopedics;   Laterality: Right;   PARTIAL HYSTERECTOMY     ROTATOR CUFF REPAIR Right 2009   SHOULDER SURGERY Right    shoulder dislocation, fall, and elbow unla nerve   SHOULDER SURGERY Right 2015   labral tear   TRANSTHORACIC ECHOCARDIOGRAM  06/2017    EF 55-60%.  grade 1 diastolic dysfunction.  Otherwise normal   TUBAL LIGATION     ULNAR NERVE REPAIR Right 08/19/2014    Social History   Socioeconomic History   Marital status: Married    Spouse name: Not on file   Number of children: 3   Years of education: Not on file   Highest  education level: GED or equivalent  Occupational History   Occupation: unemployed  Tobacco Use   Smoking status: Former    Current packs/day: 0.00    Types: Cigarettes    Quit date: 08/04/1998    Years since quitting: 25.7   Smokeless tobacco: Never  Vaping Use   Vaping status: Never Used  Substance and Sexual Activity   Alcohol use: No   Drug use: No   Sexual activity: Yes    Birth control/protection: Surgical, Post-menopausal  Other Topics Concern   Not on file  Social History Narrative   Marital status: married x 20 years; moderately happy     Children: 3 children (47 Kayla, 100 Dianna, 40 April); 8 grandchildren; 0 gg     Lives: with husband/Steve, April, 2 granddaughters     Employment:  Unemployed; quit working 2015 with work related injury; awaiting disability in 2018; disability approved in 02/2017.       Tobacco: quit smoking in 2016; smoked x 2 years.      Alcohol: none      Drugs: none      Exercise: rarely in 2018         Social Drivers of Health   Financial Resource Strain: Low Risk  (04/25/2024)   Overall Financial Resource Strain (CARDIA)    Difficulty of Paying Living Expenses: Not hard at all  Food Insecurity: No Food Insecurity (04/25/2024)   Hunger Vital Sign    Worried About Running Out of Food in the Last Year: Never true    Ran Out of Food in the Last Year: Never true  Transportation Needs: No Transportation Needs (04/25/2024)    PRAPARE - Administrator, Civil Service (Medical): No    Lack of Transportation (Non-Medical): No  Physical Activity: Inactive (03/31/2023)   Exercise Vital Sign    Days of Exercise per Week: 0 days    Minutes of Exercise per Session: 0 min  Stress: Stress Concern Present (04/25/2024)   [employer] of Occupational Health - Occupational Stress Questionnaire    Feeling of Stress: Rather much  Social Connections: Socially Integrated (04/25/2024)   Social Connection and Isolation Panel    Frequency of Communication with Friends and Family: More than three times a week    Frequency of Social Gatherings with Friends and Family: Once a week    Attends Religious Services: More than 4 times per year    Active Member of [employer] or Organizations: Yes    Attends Engineer, structural: More than 4 times per year    Marital Status: Married  Catering manager Violence: Not At Risk (03/31/2023)   Humiliation, Afraid, Rape, and Kick questionnaire    Fear of Current or Ex-Partner: No    Emotionally Abused: No    Physically Abused: No    Sexually Abused: No    Family History  Problem Relation Age of Onset   Heart disease Mother 59       AMI/CAD/CHF as cause of death   Hypertension Mother    Hyperlipidemia Mother    Hypothyroidism Mother    Depression Mother    Gout Mother    Heart disease Father 47       AMI   Hypertension Father    Stroke Father 45       mild CVA   Prostate cancer Father    Hyperlipidemia Father    Cancer Father 60       prostate cancer   Cancer Sister  Basal cell carcinoma scalp   Depression Sister    Arthritis Sister    Depression Sister    Cancer Maternal Grandmother        type unknown   Cancer Maternal Grandfather        type unknown   Heart disease Paternal Grandmother    Heart defect Paternal Grandfather    Hypertension Brother    Hyperlipidemia Brother    Cancer Brother        prostate cancer   Melanoma Brother     Hyperlipidemia Brother    Hypertension Brother    Cancer Brother        melanoma   Hypertension Brother    Hyperlipidemia Brother    Melanoma Brother    Cancer Brother 25       melanoma   Hypertension Brother    Depression Brother    Hypertension Brother    Gout Brother    Colon cancer Neg Hx    Esophageal cancer Neg Hx    Stomach cancer Neg Hx    Rectal cancer Neg Hx    Breast cancer Neg Hx      Review of Systems  Constitutional: Negative.  Negative for chills and fever.  HENT: Negative.  Negative for congestion and sore throat.   Respiratory: Negative.  Negative for cough and shortness of breath.   Cardiovascular: Negative.  Negative for chest pain and palpitations.  Gastrointestinal:  Negative for abdominal pain, diarrhea, nausea and vomiting.  Genitourinary: Negative.  Negative for dysuria and hematuria.  Skin: Negative.  Negative for rash.  Neurological: Negative.  Negative for dizziness and headaches.  All other systems reviewed and are negative.   Today's Vitals   04/27/24 1302  BP: 130/88  Pulse: 70  Temp: 98.4 F (36.9 C)  TempSrc: Oral  SpO2: 97%  Weight: 212 lb (96.2 kg)  Height: 5' 4 (1.626 m)   Body mass index is 36.39 kg/m.   Physical Exam Vitals reviewed.  Constitutional:      Appearance: Normal appearance.  HENT:     Head: Normocephalic.     Right Ear: Tympanic membrane, ear canal and external ear normal.     Left Ear: Tympanic membrane, ear canal and external ear normal.     Mouth/Throat:     Mouth: Mucous membranes are moist.     Pharynx: Oropharynx is clear.  Eyes:     Extraocular Movements: Extraocular movements intact.     Conjunctiva/sclera: Conjunctivae normal.     Pupils: Pupils are equal, round, and reactive to light.  Cardiovascular:     Rate and Rhythm: Normal rate and regular rhythm.     Pulses: Normal pulses.     Heart sounds: Normal heart sounds.  Pulmonary:     Effort: Pulmonary effort is normal.     Breath sounds:  Normal breath sounds.  Abdominal:     Palpations: Abdomen is soft.     Tenderness: There is no abdominal tenderness.  Musculoskeletal:     Cervical back: No tenderness.     Comments: Bilateral bunions  Lymphadenopathy:     Cervical: No cervical adenopathy.  Skin:    General: Skin is warm and dry.     Capillary Refill: Capillary refill takes less than 2 seconds.  Neurological:     General: No focal deficit present.     Mental Status: She is alert and oriented to person, place, and time.  Psychiatric:        Mood and Affect: Mood  normal.        Behavior: Behavior normal.      ASSESSMENT & PLAN: A total of 44 minutes was spent with the patient and counseling/coordination of care regarding preparing for this visit, review of most recent office visit notes, review of multiple chronic medical conditions and their management, review of all medications, review of most recent bloodwork results, review of health maintenance items, education on nutrition, prognosis, documentation, and need for follow up.   Problem List Items Addressed This Visit       Cardiovascular and Mediastinum   Essential hypertension - Primary (Chronic)   BP Readings from Last 3 Encounters:  04/27/24 130/88  02/25/24 136/73  01/27/24 134/80  Well-controlled on current dose of lisinopril  20 mg daily and Toprol  25 mg daily.  Cardiovascular risks associated with hypertension discussed Scheduled to follow-up with cardiologist next November       Chronic diastolic heart failure (HCC) (Chronic)   Most recent echocardiogram report as per cardiologist: Your echocardiogram shows a normal pumping function of the heart (60 to 65%). There is no regional wall motion abnormalities to suspect prior heart damage. The left ventricle is consistent with grade 1 diastolic dysfunction (mildly stiff ventricles) when the heart is relaxed (diastolic) which could be causing some of your fluid retention. There are no major valvular  abnormalities. Overall these results are reassuring. Continue current medication regimen and follow-up as scheduled.       Pulmonary arterial hypertension (HCC)   Suggestion of dilated PA on Coronary Calcium  Score, but not confirmed by echo both in 2020 and 2025.         Respiratory   OSA (obstructive sleep apnea) (Chronic)   Stable.  Continue CPAP treatment.      Relevant Orders   Ambulatory referral to Sleep Studies     Digestive   Gastroesophageal reflux disease   Recent upper and lower endoscopies last July.  Reports reviewed. Presently on pantoprazole  40 mg twice a day        Genitourinary   Stage 3a chronic kidney disease (HCC)   Stable chronic condition. Advised to stay well-hydrated and avoid NSAIDs Would benefit from Farxiga or Jardiance       Relevant Medications   empagliflozin  (JARDIANCE ) 10 MG TABS tablet     Other   Chronic pain syndrome   Stable.  On Cymbalta  and gabapentin .      Adjustment disorder with mixed anxiety and depressed mood   Currently stable Takes Cymbalta  and Trintellix  Also uses BuSpar  7.5 mg twice a day as needed for anxiety      Class 2 severe obesity due to excess calories with serious comorbidity and body mass index (BMI) of 36.0 to 36.9 in adult   Relevant Medications   semaglutide -weight management (WEGOVY ) 0.25 MG/0.5ML SOAJ SQ injection   empagliflozin  (JARDIANCE ) 10 MG TABS tablet   Moderate episode of recurrent major depressive disorder (HCC)   Currently stable. On Cymbalta  and Trintellix       Prediabetes   Chronic stable condition Diet and nutrition discussed      Postviral fatigue syndrome   Recent COVID infection Still recovering but making progress      Other Visit Diagnoses       At risk for cardiovascular event       Relevant Medications   semaglutide -weight management (WEGOVY ) 0.25 MG/0.5ML SOAJ SQ injection     Bilateral bunions       Relevant Orders   Ambulatory referral to Podiatry  Patient Instructions  Health Maintenance After Age 49 After age 72, you are at a higher risk for certain long-term diseases and infections as well as injuries from falls. Falls are a major cause of broken bones and head injuries in people who are older than age 69. Getting regular preventive care can help to keep you healthy and well. Preventive care includes getting regular testing and making lifestyle changes as recommended by your health care provider. Talk with your health care provider about: Which screenings and tests you should have. A screening is a test that checks for a disease when you have no symptoms. A diet and exercise plan that is right for you. What should I know about screenings and tests to prevent falls? Screening and testing are the best ways to find a health problem early. Early diagnosis and treatment give you the best chance of managing medical conditions that are common after age 11. Certain conditions and lifestyle choices may make you more likely to have a fall. Your health care provider may recommend: Regular vision checks. Poor vision and conditions such as cataracts can make you more likely to have a fall. If you wear glasses, make sure to get your prescription updated if your vision changes. Medicine review. Work with your health care provider to regularly review all of the medicines you are taking, including over-the-counter medicines. Ask your health care provider about any side effects that may make you more likely to have a fall. Tell your health care provider if any medicines that you take make you feel dizzy or sleepy. Strength and balance checks. Your health care provider may recommend certain tests to check your strength and balance while standing, walking, or changing positions. Foot health exam. Foot pain and numbness, as well as not wearing proper footwear, can make you more likely to have a fall. Screenings, including: Osteoporosis screening. Osteoporosis is  a condition that causes the bones to get weaker and break more easily. Blood pressure screening. Blood pressure changes and medicines to control blood pressure can make you feel dizzy. Depression screening. You may be more likely to have a fall if you have a fear of falling, feel depressed, or feel unable to do activities that you used to do. Alcohol use screening. Using too much alcohol can affect your balance and may make you more likely to have a fall. Follow these instructions at home: Lifestyle Do not drink alcohol if: Your health care provider tells you not to drink. If you drink alcohol: Limit how much you have to: 0-1 drink a day for women. 0-2 drinks a day for men. Know how much alcohol is in your drink. In the [place] one drink equals one 12 oz bottle of beer (355 mL), one 5 oz glass of wine (148 mL), or one 1 oz glass of hard liquor (44 mL). Do not use any products that contain nicotine or tobacco. These products include cigarettes, chewing tobacco, and vaping devices, such as e-cigarettes. If you need help quitting, ask your health care provider. Activity  Follow a regular exercise program to stay fit. This will help you maintain your balance. Ask your health care provider what types of exercise are appropriate for you. If you need a cane or walker, use it as recommended by your health care provider. Wear supportive shoes that have nonskid soles. Safety  Remove any tripping hazards, such as rugs, cords, and clutter. Install safety equipment such as grab bars in bathrooms and safety rails on stairs. Keep  rooms and walkways well-lit. General instructions Talk with your health care provider about your risks for falling. Tell your health care provider if: You fall. Be sure to tell your health care provider about all falls, even ones that seem minor. You feel dizzy, tiredness (fatigue), or off-balance. Take over-the-counter and prescription medicines only as told by your health care  provider. These include supplements. Eat a healthy diet and maintain a healthy weight. A healthy diet includes low-fat dairy products, low-fat (lean) meats, and fiber from whole grains, beans, and lots of fruits and vegetables. Stay current with your vaccines. Schedule regular health, dental, and eye exams. Summary Having a healthy lifestyle and getting preventive care can help to protect your health and wellness after age 93. Screening and testing are the best way to find a health problem early and help you avoid having a fall. Early diagnosis and treatment give you the best chance for managing medical conditions that are more common for people who are older than age 7. Falls are a major cause of broken bones and head injuries in people who are older than age 73. Take precautions to prevent a fall at home. Work with your health care provider to learn what changes you can make to improve your health and wellness and to prevent falls. This information is not intended to replace advice given to you by your health care provider. Make sure you discuss any questions you have with your health care provider. Document Revised: 12/10/2020 Document Reviewed: 12/10/2020 Elsevier Patient Education  2024 Elsevier Inc.     Emil Schaumann, MD [facility]

## 2024-04-27 NOTE — Assessment & Plan Note (Signed)
 Recent upper and lower endoscopies last July.  Reports reviewed. Presently on pantoprazole  40 mg twice a day

## 2024-04-27 NOTE — Assessment & Plan Note (Signed)
 Recent COVID infection Still recovering but making progress

## 2024-04-27 NOTE — Assessment & Plan Note (Signed)
 Most recent echocardiogram report as per cardiologist: Your echocardiogram shows a normal pumping function of the heart (60 to 65%). There is no regional wall motion abnormalities to suspect prior heart damage. The left ventricle is consistent with grade 1 diastolic dysfunction (mildly stiff ventricles) when the heart is relaxed (diastolic) which could be causing some of your fluid retention. There are no major valvular abnormalities. Overall these results are reassuring. Continue current medication regimen and follow-up as scheduled.

## 2024-04-27 NOTE — Assessment & Plan Note (Signed)
 Chronic stable condition Diet and nutrition discussed

## 2024-04-27 NOTE — Assessment & Plan Note (Signed)
 Currently stable. On Cymbalta  and Trintellix 

## 2024-04-29 ENCOUNTER — Ambulatory Visit: Admitting: Physician Assistant

## 2024-05-02 ENCOUNTER — Ambulatory Visit: Admitting: Gastroenterology

## 2024-05-02 ENCOUNTER — Other Ambulatory Visit (HOSPITAL_COMMUNITY): Payer: Self-pay

## 2024-05-02 ENCOUNTER — Encounter: Payer: Self-pay | Admitting: Gastroenterology

## 2024-05-02 ENCOUNTER — Other Ambulatory Visit (HOSPITAL_COMMUNITY): Payer: Self-pay | Admitting: Gastroenterology

## 2024-05-02 ENCOUNTER — Other Ambulatory Visit (INDEPENDENT_AMBULATORY_CARE_PROVIDER_SITE_OTHER)

## 2024-05-02 VITALS — BP 140/90 | HR 76 | Ht 64.0 in | Wt 212.0 lb

## 2024-05-02 DIAGNOSIS — D508 Other iron deficiency anemias: Secondary | ICD-10-CM | POA: Diagnosis not present

## 2024-05-02 DIAGNOSIS — R1310 Dysphagia, unspecified: Secondary | ICD-10-CM | POA: Diagnosis not present

## 2024-05-02 DIAGNOSIS — R09A2 Foreign body sensation, throat: Secondary | ICD-10-CM

## 2024-05-02 DIAGNOSIS — K5904 Chronic idiopathic constipation: Secondary | ICD-10-CM

## 2024-05-02 DIAGNOSIS — Z8639 Personal history of other endocrine, nutritional and metabolic disease: Secondary | ICD-10-CM | POA: Diagnosis not present

## 2024-05-02 DIAGNOSIS — Z860100 Personal history of colon polyps, unspecified: Secondary | ICD-10-CM

## 2024-05-02 DIAGNOSIS — D509 Iron deficiency anemia, unspecified: Secondary | ICD-10-CM | POA: Diagnosis not present

## 2024-05-02 DIAGNOSIS — R490 Dysphonia: Secondary | ICD-10-CM

## 2024-05-02 DIAGNOSIS — R1312 Dysphagia, oropharyngeal phase: Secondary | ICD-10-CM

## 2024-05-02 DIAGNOSIS — K449 Diaphragmatic hernia without obstruction or gangrene: Secondary | ICD-10-CM | POA: Diagnosis not present

## 2024-05-02 DIAGNOSIS — R059 Cough, unspecified: Secondary | ICD-10-CM

## 2024-05-02 DIAGNOSIS — K5909 Other constipation: Secondary | ICD-10-CM | POA: Diagnosis not present

## 2024-05-02 DIAGNOSIS — Z860101 Personal history of adenomatous and serrated colon polyps: Secondary | ICD-10-CM | POA: Diagnosis not present

## 2024-05-02 DIAGNOSIS — K219 Gastro-esophageal reflux disease without esophagitis: Secondary | ICD-10-CM | POA: Diagnosis not present

## 2024-05-02 LAB — IBC + FERRITIN
Ferritin: 8.5 ng/mL — ABNORMAL LOW (ref 10.0–291.0)
Iron: 50 ug/dL (ref 42–145)
Saturation Ratios: 13.4 % — ABNORMAL LOW (ref 20.0–50.0)
TIBC: 372.4 ug/dL (ref 250.0–450.0)
Transferrin: 266 mg/dL (ref 212.0–360.0)

## 2024-05-02 MED ORDER — PANTOPRAZOLE SODIUM 40 MG PO TBEC
DELAYED_RELEASE_TABLET | ORAL | 3 refills | Status: AC
  Filled 2024-05-02: qty 180, 180d supply, fill #0
  Filled 2024-05-12: qty 90, 90d supply, fill #0
  Filled 2024-08-09: qty 90, 50d supply, fill #1
  Filled 2024-08-22: qty 90, 90d supply, fill #0

## 2024-05-02 NOTE — Progress Notes (Signed)
 Chief Complaint: follow-up EGD/colon, anemia Primary GI Doctor:(previously Dr. Teressa) Dr. Federico  HPI:  Patient is a  68  year old female patient with past medical history of IDA, CHF, hypertension, GERD with HH, OSA on CPAP, CKD, Fibromyalgia,anxiety, depression, who was self referred to me for a complaint of discus EGD, anemia .   Patient last seen by myself on 01/27/24 for IDA.  11/04/23 last seen by oncology/Hematology for IDA. Pt started on IV iron  infusions. Recommendations for capsule endoscopy to evaluate small bowel for bleeding.  11/03/23 last seen by cardiology for SOB. Reviewed entire note.   Interval History     Patient presents for follow-up on EGD and colonoscopy.  Endoscopy had showed some irritation and gastritis believed to be cause of her anemia.  Patient was started on pantoprazole  40 mg twice daily for 10 weeks, she is currently on week 8.      Patient's esophagus was dilated without improvement in dysphagia. She reports history of goiter that has not been evaluated in several years.  She also continues with globus sensation and hoarseness.    Lastly patient has history of chronic constipation and I have provided samples of Linzess  72  mcg po daily which has worked well.  Patient has at least 1 regular bowel movement per day.  No abdominal pain or blood in stool.   Patient's family history includes brother with Crohn's disease she states was diagnosed in his 81's, Sister with reflux and GI surgery of some sort, mother with H/H, father and brothers with prostate CA.  Wt Readings from Last 3 Encounters:  05/02/24 212 lb (96.2 kg)  04/27/24 212 lb (96.2 kg)  02/25/24 215 lb (97.5 kg)    Past Medical History:  Diagnosis Date   Allergy    [name], Flonase    Anemia    Anxiety    Arthritis    Chronic kidney disease    Colon polyps    Depression    Fibromyalgia    Gastritis    GERD (gastroesophageal reflux disease)    Hernia, hiatal    High risk for colon cancer     HLD (hyperlipidemia)    Hypertension    Migraine    Neuropathy    Osteoporosis    Pneumonia    Sleep apnea    c-pap nightly   Sleep apnea with use of continuous positive airway pressure (CPAP)    Thyroid  goiter     Past Surgical History:  Procedure Laterality Date   48-Hour Holter Monitor  06/2017   Mostly normal sinus rhythm.  Average heart rate 71 bpm.  Minimum 54 bpm.  Maximum sinus rate 114 bpm.  Rare PACs and PVCs.  No A. fib, atrial flutter, SVT or VT.  One brief run of 8 beat PAT.   ABDOMINAL HYSTERECTOMY     ovaries intact; DUB.  No dysplasia.   FRACTURE SURGERY N/A    [employer] 09/25/2020   JOINT REPLACEMENT     OPEN REDUCTION INTERNAL FIXATION (ORIF) DISTAL RADIAL FRACTURE Right 07/09/2017   Procedure: RIGHT WRIST OPEN REDUCTION INTERNAL FIXATION (ORIF) DISTAL RADIAL FRACTURE AND REPAIR AS INDICATED;  Surgeon: Shari Easter, MD;  Location: [facility] OR;  Service: Orthopedics;  Laterality: Right;   PARTIAL HYSTERECTOMY     ROTATOR CUFF REPAIR Right 2009   SHOULDER SURGERY Right    shoulder dislocation, fall, and elbow unla nerve   SHOULDER SURGERY Right 2015   labral tear   TRANSTHORACIC ECHOCARDIOGRAM  06/2017  EF 55-60%.  grade 1 diastolic dysfunction.  Otherwise normal   TUBAL LIGATION     ULNAR NERVE REPAIR Right 08/19/2014    Current Outpatient Medications  Medication Sig Dispense Refill   albuterol  (PROAIR  HFA) 108 (90 Base) MCG/ACT inhaler Inhale 1-2 puffs into the lungs every 4 (four) hours as needed for wheezing or shortness of breath. 18 g 0   atorvastatin  (LIPITOR) 20 MG tablet Take 1 tablet (20 mg total) by mouth daily. 90 tablet 3   benzonatate  (TESSALON ) 100 MG capsule Take 1 capsule (100 mg total) by mouth 3 (three) times daily as needed for cough. 30 capsule 0   busPIRone  (BUSPAR ) 7.5 MG tablet Take 1 tablet (7.5 mg total) by mouth 2 (two) times daily. 180 tablet 1   butalbital -acetaminophen -caffeine  (FIORICET ) 50-325-40 MG tablet Take 1 tablet by mouth  every 6 (six) hours as needed for headache. 20 tablet 0   cyclobenzaprine  (FLEXERIL ) 10 MG tablet Take 1 tablet (10 mg total) by mouth 3 (three) times daily as needed for muscle spasms. 180 tablet 1   DULoxetine  (CYMBALTA ) 60 MG capsule Take 1 capsule (60 mg total) by mouth daily. 90 capsule 1   empagliflozin  (JARDIANCE ) 10 MG TABS tablet Take 1 tablet (10 mg total) by mouth daily before breakfast. 90 tablet 3   fluticasone  (FLONASE ) 50 MCG/ACT nasal spray Place 1 spray into both nostrils daily. 16 g 6   furosemide  (LASIX ) 20 MG tablet Take 1 tablet (20 mg total) by mouth daily. 90 tablet 1   gabapentin  (NEURONTIN ) 300 MG capsule Take 1-2 capsules (300-600 mg total) by mouth 4 (four) times daily. 540 capsule 1   linaclotide  (LINZESS ) 72 MCG capsule Take 1 capsule (72 mcg total) by mouth daily before breakfast. 30 capsule 2   lisinopril  (ZESTRIL ) 20 MG tablet Take 1 tablet (20 mg total) by mouth daily. 90 tablet 1   metoprolol  succinate (TOPROL  XL) 25 MG 24 hr tablet Take 1 tablet (25 mg total) by mouth daily. 90 tablet 2   ondansetron  (ZOFRAN -ODT) 4 MG disintegrating tablet Dissolve 1 tablet (4 mg total) in mouth every 8 (eight) hours as needed for nausea or vomiting. 20 tablet 4   pantoprazole  (PROTONIX ) 40 MG tablet Take 1 tablet (40 mg total) by mouth 2 (two) times daily for 10 weeks, THEN 1 tablet (40 mg total) daily. 180 tablet 3   potassium chloride  SA (KLOR-CON  M) 20 MEQ tablet Take 1 tablet (20 mEq total) by mouth daily. 90 tablet 2   semaglutide -weight management (WEGOVY ) 0.25 MG/0.5ML SOAJ SQ injection Inject 0.25 mg into the skin once a week. Increase dose to 0.5 mg weekly after 3 to 4 weeks if side effects tolerated 2 mL 3   topiramate  (TOPAMAX ) 50 MG tablet Take 1 tablet (50 mg total) by mouth 2 (two) times daily. 180 tablet 1   vortioxetine  HBr (TRINTELLIX ) 10 MG TABS tablet Take 1 tablet (10 mg total) by mouth daily. 90 tablet 1   zolpidem  (AMBIEN ) 5 MG tablet Take 1 tablet (5 mg  total) by mouth at bedtime as needed for sleep. 15 tablet 1   No current facility-administered medications for this visit.    Allergies as of 05/02/2024 - Review Complete 05/02/2024  Allergen Reaction Noted   Sulfa antibiotics Itching 01/25/2014    Family History  Problem Relation Age of Onset   Heart disease Mother 1       AMI/CAD/CHF as cause of death   Hypertension Mother    Hyperlipidemia  Mother    Hypothyroidism Mother    Depression Mother    Gout Mother    Heart disease Father 61       AMI   Hypertension Father    Stroke Father 51       mild CVA   Prostate cancer Father    Hyperlipidemia Father    Cancer Father 85       prostate cancer   Cancer Sister        Basal cell carcinoma scalp   Depression Sister    Arthritis Sister    Depression Sister    Cancer Maternal Grandmother        type unknown   Cancer Maternal Grandfather        type unknown   Heart disease Paternal Grandmother    Heart defect Paternal Grandfather    Hypertension Brother    Hyperlipidemia Brother    Cancer Brother        prostate cancer   Melanoma Brother    Hyperlipidemia Brother    Hypertension Brother    Cancer Brother        melanoma   Hypertension Brother    Hyperlipidemia Brother    Melanoma Brother    Cancer Brother 25       melanoma   Hypertension Brother    Depression Brother    Hypertension Brother    Gout Brother    Colon cancer Neg Hx    Esophageal cancer Neg Hx    Stomach cancer Neg Hx    Rectal cancer Neg Hx    Breast cancer Neg Hx     Review of Systems:    Constitutional: No weight loss, fever, chills, weakness or fatigue HEENT: Eyes: No change in vision               Ears, Nose, Throat:  No change in hearing or congestion Skin: No rash or itching Cardiovascular: No chest pain, chest pressure or palpitations   Respiratory: No SOB or cough Gastrointestinal: See HPI and otherwise negative Genitourinary: No dysuria or change in urinary  frequency Neurological: No headache, dizziness or syncope Musculoskeletal: No new muscle or joint pain Hematologic: No bleeding or bruising Psychiatric: No history of depression or anxiety    Physical Exam:  Vital signs: BP (!) 140/90   Pulse 76   Ht 5' 4 (1.626 m)   Wt 212 lb (96.2 kg)   BMI 36.39 kg/m   Constitutional: Pleasant female appears to be in NAD, Well developed, Well nourished, alert and cooperative Throat: Oral cavity and pharynx without inflammation, swelling or lesion.  Respiratory: Respirations even and unlabored. Lungs clear to auscultation bilaterally.   No wheezes, crackles, or rhonchi.  Cardiovascular: Normal S1, S2. Regular rate and rhythm. No peripheral edema, cyanosis or pallor.  Gastrointestinal:  Soft, nondistended, nontender. No rebound or guarding. Normal bowel sounds. No appreciable masses or hepatomegaly. Rectal:  Not performed.  Msk:  Symmetrical without gross deformities. Without edema, no deformity or joint abnormality.  Neurologic:  Alert and  oriented x4;  grossly normal neurologically.  Skin:   Dry and intact without significant lesions or rashes. Psychiatric: Oriented to person, place and time. Demonstrates good judgement and reason without abnormal affect or behaviors.  RELEVANT LABS AND IMAGING: CBC    Latest Ref Rng & Units 10/08/2023    1:48 PM 10/06/2023    6:16 PM 10/22/2022    2:53 PM  CBC  WBC 4.0 - 10.5 K/uL 4.9  4.3  5.7  Hemoglobin 12.0 - 15.0 g/dL 89.4  9.0  86.7   Hematocrit 36.0 - 46.0 % 33.8  30.3  39.4   Platelets 150.0 - 400.0 K/uL 280.0  213  195.0      CMP     Latest Ref Rng & Units 10/26/2023    2:04 PM 10/06/2023    6:16 PM 10/22/2022    2:53 PM  CMP  Glucose 70 - 99 mg/dL 97  78  89   BUN 6 - 23 mg/dL 18  8  11    Creatinine 0.40 - 1.20 mg/dL 8.97  9.12  8.85   Sodium 135 - 145 mEq/L 141  139  139   Potassium 3.5 - 5.1 mEq/L 4.5  3.9  3.9   Chloride 96 - 112 mEq/L 110  108  109   CO2 19 - 32 mEq/L 25  24  24     Calcium  8.4 - 10.5 mg/dL 9.0  8.7  9.3   Total Protein 6.0 - 8.3 g/dL 6.8  6.5  6.6   Total Bilirubin 0.2 - 1.2 mg/dL 0.3  0.4  0.4   Alkaline Phos 39 - 117 U/L 61  56  51   AST 0 - 37 U/L 14  22  13    ALT 0 - 35 U/L 11  17  9       Lab Results  Component Value Date   TSH 1.20 10/26/2023  10/20/2023 echo-Left ventricular ejection fraction, by estimation, is 60 to 65%.  10/06/23 fecal occult negative  GI procedures:  02/25/24 colonoscopy with Dr. Federico jury 3 years - The examined portion of the ileum was normal.  - Five 3 to 12 mm polyps in the transverse colon and in the ascending colon, removed with a cold snare. Resected and retrieved.Diverticulosis in the sigmoid colon and in the descending colon.  - A tattoo was seen in the rectum.  - Non- bleeding internal hemorrhoids. Path: 4. Surgical [P], colon, ascending, transverse, polyp (5) :       -TUBULAR ADENOMA, NEGATIVE FOR HIGH-GRADE DYSPLASIA.       -SERRATED MUCOSAL POLYP(S) FRAGMENTS WITH FOCAL FEATURES DIAGNOSTIC OF SESSILE       SERRATED POLYP(S), WITHOUT IDENTIFIED DYSPLASIA.       -NEGATIVE FOR MALIGNANCY   02/25/24 EGD with Dr. Federico - Normal esophagus. Dilated. Biopsied.  - Large hiatal hernia.  - Gastritis. Biopsied.  - Non- bleeding duodenal diverticulum. Path: 1. Surgical [P], gastric antrum and gastric body :      -GASTRIC MUCOSA WITH MILD FOVEOLAR HYPERPLASIA.      -NO INFLAMMATORY PATTERN PREDICTIVE OF HELICOBACTER PYLORI INFECTION IDENTIFIED.      -NEGATIVE FOR INTESTINAL METAPLASIA AND MALIGNANCY.       2. Surgical [P], distal esophagus :      -SQUAMOUS MUCOSA WITH MILD REACTIVE CHANGES CONSISTENT WITH REFLUX EFFECT (MILD      BASAL HYPERPLASIA AND FOCALLY ELONGATED PAPILLAE).      -NEGATIVE FOR ACTIVE ESOPHAGITIS AND INTRASQUAMOUS EOSINOPHILS.      -NO VIRAL CYTOPATHIC CHANGE OR FUNGI IDENTIFIED (ON H&E).      -NEGATIVE FOR DYSPLASIA AND MALIGNANCY.      -NO GLANDULAR MUCOSA PRESENT.       3. Surgical  [P], proximal esophagus biopsy :      -SQUAMOUS MUCOSA WITH MILD REACTIVE CHANGE CONSISTENT WITH REFLUX EFFECT (MILD      BASAL HYPERPLASIA).      -NEGATIVE FOR ACTIVE ESOPHAGITIS AND INTRASQUAMOUS EOSINOPHILS.      -NO  VIRAL CYTOPATHIC CHANGE OR FUNGI IDENTIFIED (ON H&E).      -NEGATIVE FOR DYSPLASIA AND MALIGNANCY.      -NO GLANDULAR MUCOSA PRESENT.    01/31/2021 colonoscopy with Dr. Teressa, recall 3 years - One 7 mm polyp in the descending colon, removed with a cold snare. Resected and retrieved. - Diverticulosis in the left colon. - The site of 2020 rectal polypectomy was easily located. No sign of recurrent neoplastic mucosa. - The examination was otherwise normal on direct and retroflexion views. --The minor rectal bleeding was most likely from hemorrhoids which can come and go, currently no hemorrhoids are present. 07/25/2019 flex sig with Dr. Teressa -- The site of recent polypectomy in the distal rectum was easily located ( scar tissue and previous Spot tattoo) . There was no residual or recurrent adenomatous mucosa however the site was sampled with biopsy forceps. - The examination to the splenic flexure was otherwise normal. 01/04/2019 flex sig with Dr. Teressa-- The site of recent rectal polypectomy was easily found, located about 2cm from the anal verge. There was no obvious remaining polyp tissue however I used snare/ cautery to remove the polyectomy site and then biopsied the edges of the site. Lastly I injected SPOT submucosally adjacent to the polypectomy site to aid in future localization. 12/21/2018 EGD with Dr. Teressa for iron  deficiency anemia - Medium- sized hiatal hernia with Ole' s type erosions. This is very possibly the source of her iron  deficiency anemia ( medium sized rectal polyp Dorlisa Savino have contributed as well) . - The examination was otherwise normal. 12/21/2018 colonoscopy with Dr. Teressa iron  deficiency anemia - Two 10 to 11 mm polyps in the ascending colon, removed with  a hot snare. Resected and retrieved. - One 8 mm polyp in the descending colon, removed with a cold snare. Resected and retrieved. - One 16 mm polyp in the rectum, removed with a hot snare. Resected and retrieved. - The examination was otherwise normal on direct and retroflexion views. 08/29/2016 EGD with Dr. Teressa for nausea, remote atrial ulcer - Normal esophagus. - Large hiatal hernia. - Gastritis. Biopsied to check for H. pylori. - Normal examined duodenum.  Assessment: Encounter Diagnoses  Name Primary?   Other iron  deficiency anemia    Oropharyngeal dysphagia    Gastroesophageal reflux disease without esophagitis Yes   History of goiter    Hoarseness    Globus sensation    Chronic idiopathic constipation    History of colonic polyps       68 year old female patient with history of iron  deficiency anemia treated with iron  infusions approximately four to five years ago. Previous investigations, including a colonoscopy and endoscopy in 2020, did not reveal a definitive cause for her recurrent iron  deficiency.  Most recent endoscopy showed large hiatal hernia with serpentine ulcerations in the gastric atrium.  This was thought to be the cause of the anemia and patient placed on twice daily reflux medication.  Will recheck her levels today to see if they have improved.  If levels continue to drop we will consider small endoscopy.  Patient continues with dysphagia despite dilatation with EGD.  Patient does mention history of goiter that has not been evaluated recently.  Will go ahead and order thyroid  ultrasound to evaluate. TSH 1.20. Patient also mentions dysphagia with liquids along with solids will go ahead and order modified barium swallow study with tablet to rule out facility issues.    Patient's chronic constipation is well-managed with Linzess  72 mcg p.o.  daily, will continue current regimen.    Patient with history of tubular adenomas up-to-date on colonoscopy, recall July 2028    Plan: -recheck iron  panel/TIBC today -consider small capsule endoscopy - Reinforced GERD diet, no late meals 3-4 hours before lying down -Pantoprazole  40 mg BID for 10 weeks, then once daily - Order thyroid  u/s - Order MBSS with tablet  -Recommend High fiber diet -Continue Linzess  72 mcg po daily  -recall colonoscopy 02/2027  Thank you for the courtesy of this consult. Please call me with any questions or concerns.   Essie Gehret, FNP-C [facility] 05/02/2024, 4:45 PM  Cc: Sagardia, Miguel Jose, *

## 2024-05-02 NOTE — Progress Notes (Signed)
 Agree with assessment and plan as outlined.

## 2024-05-02 NOTE — Patient Instructions (Signed)
 GERD Recommend GERD diet Continue pantoprazole  40 mg 1 tablet twice daily for 10 weeks (2 weeks left) then once daily  Your provider has requested that you go to the basement level for lab work before leaving today. Press B on the elevator. The lab is located at the first door on the left as you exit the elevator.   You have been scheduled for an thyroid  ultrasound at [facility] (1st floor of hospital) on 05/06/24 at 3:00pm. Please arrive 30 minutes prior to your appointment for registration. Make certain not to have anything to eat or drink 6 hours prior to your appointment. Should you need to reschedule your appointment, please contact radiology at [phone]. This test typically takes about 30 minutes to perform.  You have been scheduled for a modified barium swallow on 05/12/24 at 11:00am. Please arrive 30 minutes prior to your test for registration. You will go to [facility] (1st Floor) for your appointment. Should you need to cancel or reschedule your appointment, please contact [phone] Clora [place]) or [phone] Geroge Long). _____________________________________________________________________ A Modified Barium Swallow Study, or MBS, is a special x-ray that is taken to check swallowing skills. It is carried out by a Marine scientist and a Warehouse manager (SLP). During this test, yourmouth, throat, and esophagus, a muscular tube which connects your mouth to your stomach, is checked. The test will help you, your doctor, and the SLP plan what types of foods and liquids are easier for you to swallow. The SLP will also identify positions and ways to help you swallow more easily and safely. What will happen during an MBS? You will be taken to an x-ray room and seated comfortably. You will be asked to swallow small amounts of food and liquid mixed with barium. Barium is a liquid or paste that allows images of your mouth, throat and esophagus to be seen  on x-ray. The x-ray captures moving images of the food you are swallowing as it travels from your mouth through your throat and into your esophagus. This test helps identify whether food or liquid is entering your lungs (aspiration). The test also shows which part of your mouth or throat lacks strength or coordination to move the food or liquid in the right direction. This test typically takes 30 minutes to 1 hour to complete. _______________________________________________________________________  __________________________________________________________________ A barium swallow is an examination that concentrates on views of the esophagus. This tends to be a double contrast exam (barium and two liquids which, when combined, create a gas to distend the wall of the oesophagus) or single contrast (non-ionic iodine based). The study is usually tailored to your symptoms so a good history is essential. Attention is paid during the study to the form, structure and configuration of the esophagus, looking for functional disorders (such as aspiration, dysphagia, achalasia, motility and reflux) EXAMINATION You may be asked to change into a gown, depending on the type of swallow being performed. A radiologist and radiographer will perform the procedure. The radiologist will advise you of the type of contrast selected for your procedure and direct you during the exam. You will be asked to stand, sit or lie in several different positions and to hold a small amount of fluid in your mouth before being asked to swallow while the imaging is performed .In some instances you may be asked to swallow barium coated marshmallows to assess the motility of a solid food bolus. The exam can be recorded as a digital or video fluoroscopy  procedure. POST PROCEDURE It will take 1-2 days for the barium to pass through your system. To facilitate this, it is important, unless otherwise directed, to increase your fluids for the next  24-48hrs and to resume your normal diet.  This test typically takes about 30 minutes to perform. _______________________________________________________  If your blood pressure at your visit was 140/90 or greater, please contact your primary care physician to follow up on this.  _______________________________________________________  If you are age 74 or older, your body mass index should be between 23-30. Your Body mass index is 36.39 kg/m. If this is out of the aforementioned range listed, please consider follow up with your Primary Care Provider.  If you are age 43 or younger, your body mass index should be between 19-25. Your Body mass index is 36.39 kg/m. If this is out of the aformentioned range listed, please consider follow up with your Primary Care Provider.   ________________________________________________________  The [facility] providers would like to encourage you to use MYCHART to communicate with providers for non-urgent requests or questions.  Due to long hold times on the telephone, sending your provider a message by [facility] may be a faster and more efficient way to get a response.  Please allow 48 business hours for a response.  Please remember that this is for non-urgent requests.  _______________________________________________________  [facility] is using a team-based approach to care.  Your team is made up of your doctor and two to three APPS. Our APPS (Nurse Practitioners and Physician Assistants) work with your physician to ensure care continuity for you. They are fully qualified to address your health concerns and develop a treatment plan. They communicate directly with your gastroenterologist to care for you. Seeing the Advanced Practice Practitioners on your physician's team can help you by facilitating care more promptly, often allowing for earlier appointments, access to diagnostic testing, procedures, and other specialty referrals.   Thank you for  trusting me with your gastrointestinal care. Deanna May, FNP-C

## 2024-05-03 ENCOUNTER — Other Ambulatory Visit (HOSPITAL_COMMUNITY): Payer: Self-pay

## 2024-05-03 ENCOUNTER — Encounter: Payer: Self-pay | Admitting: Hematology and Oncology

## 2024-05-05 ENCOUNTER — Inpatient Hospital Stay: Admitting: Hematology and Oncology

## 2024-05-05 ENCOUNTER — Inpatient Hospital Stay

## 2024-05-06 ENCOUNTER — Ambulatory Visit (HOSPITAL_COMMUNITY)
Admission: RE | Admit: 2024-05-06 | Discharge: 2024-05-06 | Disposition: A | Discharge status: Home / Self Care | Source: Ambulatory Visit | Attending: Gastroenterology | Admitting: Gastroenterology

## 2024-05-06 DIAGNOSIS — Z8639 Personal history of other endocrine, nutritional and metabolic disease: Secondary | ICD-10-CM | POA: Insufficient documentation

## 2024-05-06 DIAGNOSIS — E042 Nontoxic multinodular goiter: Secondary | ICD-10-CM | POA: Diagnosis not present

## 2024-05-09 ENCOUNTER — Ambulatory Visit: Payer: Self-pay | Admitting: Gastroenterology

## 2024-05-12 ENCOUNTER — Other Ambulatory Visit: Payer: Self-pay

## 2024-05-12 ENCOUNTER — Ambulatory Visit (HOSPITAL_COMMUNITY)
Admission: RE | Admit: 2024-05-12 | Discharge: 2024-05-12 | Disposition: A | Discharge status: Home / Self Care | Source: Ambulatory Visit | Attending: Emergency Medicine | Admitting: Emergency Medicine

## 2024-05-12 ENCOUNTER — Other Ambulatory Visit: Payer: Self-pay | Admitting: Emergency Medicine

## 2024-05-12 ENCOUNTER — Other Ambulatory Visit (HOSPITAL_COMMUNITY): Payer: Self-pay

## 2024-05-12 DIAGNOSIS — R0989 Other specified symptoms and signs involving the circulatory and respiratory systems: Secondary | ICD-10-CM | POA: Diagnosis not present

## 2024-05-12 DIAGNOSIS — F339 Major depressive disorder, recurrent, unspecified: Secondary | ICD-10-CM

## 2024-05-12 DIAGNOSIS — K219 Gastro-esophageal reflux disease without esophagitis: Secondary | ICD-10-CM | POA: Diagnosis not present

## 2024-05-12 DIAGNOSIS — K5904 Chronic idiopathic constipation: Secondary | ICD-10-CM | POA: Insufficient documentation

## 2024-05-12 DIAGNOSIS — Z8639 Personal history of other endocrine, nutritional and metabolic disease: Secondary | ICD-10-CM | POA: Insufficient documentation

## 2024-05-12 DIAGNOSIS — R09A2 Foreign body sensation, throat: Secondary | ICD-10-CM | POA: Insufficient documentation

## 2024-05-12 DIAGNOSIS — R1310 Dysphagia, unspecified: Secondary | ICD-10-CM

## 2024-05-12 DIAGNOSIS — R490 Dysphonia: Secondary | ICD-10-CM | POA: Diagnosis not present

## 2024-05-12 DIAGNOSIS — R1312 Dysphagia, oropharyngeal phase: Secondary | ICD-10-CM | POA: Insufficient documentation

## 2024-05-12 DIAGNOSIS — D508 Other iron deficiency anemias: Secondary | ICD-10-CM | POA: Insufficient documentation

## 2024-05-12 DIAGNOSIS — R059 Cough, unspecified: Secondary | ICD-10-CM

## 2024-05-12 MED ORDER — VORTIOXETINE HBR 10 MG PO TABS
10.0000 mg | ORAL_TABLET | Freq: Every day | ORAL | 1 refills | Status: AC
  Filled 2024-05-12: qty 90, 90d supply, fill #0
  Filled 2024-08-09: qty 90, 90d supply, fill #1

## 2024-05-12 NOTE — Evaluation (Signed)
 Modified Barium Swallow Study  Patient Details  Name: [name] MRN: [id number] Date of Birth: August 11, 1955  Today's Date: 05/12/2024  Modified Barium Swallow completed.  Full report located under Chart Review in the Imaging Section.  History of Present Illness 68 yo referred for OP MBS by GI due to symptoms of dysphagia including globus sensation with both solids and liquids (although pt says it is more with solids). She also has fluctuating hoarseness. PMH includes: GERD, large hiatal hernia, goiter, esophageal dilatation   Clinical Impression Pt's oropharyngeal swallow is WFL. She has good timing, efficiency, and safety. There is no residue noted in the oral cavity or pharynx, but there was some retention in the distal esophagus. No aspiration. Recommend that pt continue with regular solids and thin liquids as tolerated. Handout was provided with esophageal precautions.  DIGEST Swallow Severity Rating*  Safety: 0  Efficiency: 0  Overall Pharyngeal Swallow Severity: 0 1: mild; 2: moderate; 3: severe; 4: profound  *The Dynamic Imaging Grade of Swallowing Toxicity is standardized for the head and neck cancer population, however, demonstrates promising clinical applications across populations to standardize the clinical rating of pharyngeal swallow safety and severity.  Factors that may increase risk of adverse event in presence of aspiration Noe & Lianne 2021): Respiratory or GI disease;Weak cough  Swallow Evaluation Recommendations Recommendations: PO diet PO Diet Recommendation: Regular;Thin liquids (Level 0) Liquid Administration via: Cup;Straw Medication Administration: Whole meds with liquid Supervision: Patient able to self-feed Swallowing strategies  : Slow rate;Small bites/sips;Follow solids with liquids Postural changes: Position pt fully upright for meals;Stay upright 30-60 min after meals Oral care recommendations: Oral care BID (2x/day) Recommended consults:  Consider ENT consultation;Other(comment) (If dysphonia persists, may want to consider ENT/laryngologist)      [name]., M.A. CCC-SLP Acute Rehabilitation Services Office: [phone]  Secure chat preferred  05/12/2024,11:54 AM

## 2024-05-13 ENCOUNTER — Other Ambulatory Visit: Payer: Self-pay

## 2024-05-13 ENCOUNTER — Telehealth: Payer: Self-pay | Admitting: Gastroenterology

## 2024-05-13 DIAGNOSIS — R9389 Abnormal findings on diagnostic imaging of other specified body structures: Secondary | ICD-10-CM

## 2024-05-13 DIAGNOSIS — E041 Nontoxic single thyroid nodule: Secondary | ICD-10-CM

## 2024-05-13 NOTE — Telephone Encounter (Signed)
 Called patient to follow-up on ultrasound of thyroid  results.  Patient had read in MyChart however had not responded if she needed referral.  Discussed with patient today and she would like to have referral to endocrinology through [facility].  Will go ahead and send referral

## 2024-05-16 ENCOUNTER — Ambulatory Visit (INDEPENDENT_AMBULATORY_CARE_PROVIDER_SITE_OTHER): Admitting: Podiatry

## 2024-05-16 ENCOUNTER — Ambulatory Visit (INDEPENDENT_AMBULATORY_CARE_PROVIDER_SITE_OTHER)

## 2024-05-16 ENCOUNTER — Encounter: Payer: Self-pay | Admitting: Podiatry

## 2024-05-16 ENCOUNTER — Ambulatory Visit: Payer: Self-pay | Admitting: Gastroenterology

## 2024-05-16 VITALS — Ht 64.0 in | Wt 212.0 lb

## 2024-05-16 DIAGNOSIS — M21611 Bunion of right foot: Secondary | ICD-10-CM

## 2024-05-16 DIAGNOSIS — M21612 Bunion of left foot: Secondary | ICD-10-CM

## 2024-05-16 NOTE — Progress Notes (Signed)
 Chief Complaint  Patient presents with   Bunions    Pt is here due to bilateral bunion with the left being the worse, she states that she notices the 1st and 2nd toes rubbing and causing some pain.    Subjective: 67 y.o. female presents today as a new patient for evaluation of minimally symptomatic bunions to the bilateral feet left greater than the right.  She states that her left great toe presses against the adjacent second digit and causes numbness and paresthesia.  Progressive over several years  Past Medical History:  Diagnosis Date   Allergy    [name], Flonase    Anemia    Anxiety    Arthritis    Chronic kidney disease    Colon polyps    Depression    Fibromyalgia    Gastritis    GERD (gastroesophageal reflux disease)    Hernia, hiatal    High risk for colon cancer    HLD (hyperlipidemia)    Hypertension    Migraine    Neuropathy    Osteoporosis    Pneumonia    Sleep apnea    c-pap nightly   Sleep apnea with use of continuous positive airway pressure (CPAP)    Thyroid  goiter     Past Surgical History:  Procedure Laterality Date   48-Hour Holter Monitor  06/2017   Mostly normal sinus rhythm.  Average heart rate 71 bpm.  Minimum 54 bpm.  Maximum sinus rate 114 bpm.  Rare PACs and PVCs.  No A. fib, atrial flutter, SVT or VT.  One brief run of 8 beat PAT.   ABDOMINAL HYSTERECTOMY     ovaries intact; DUB.  No dysplasia.   FRACTURE SURGERY N/A    Phreesia 09/25/2020   JOINT REPLACEMENT     OPEN REDUCTION INTERNAL FIXATION (ORIF) DISTAL RADIAL FRACTURE Right 07/09/2017   Procedure: RIGHT WRIST OPEN REDUCTION INTERNAL FIXATION (ORIF) DISTAL RADIAL FRACTURE AND REPAIR AS INDICATED;  Surgeon: Shari Easter, MD;  Location: [facility] OR;  Service: Orthopedics;  Laterality: Right;   PARTIAL HYSTERECTOMY     ROTATOR CUFF REPAIR Right 2009   SHOULDER SURGERY Right    shoulder dislocation, fall, and elbow unla nerve   SHOULDER SURGERY Right 2015   labral tear   TRANSTHORACIC  ECHOCARDIOGRAM  06/2017    EF 55-60%.  grade 1 diastolic dysfunction.  Otherwise normal   TUBAL LIGATION     ULNAR NERVE REPAIR Right 08/19/2014    Allergies  Allergen Reactions   Sulfa Antibiotics Itching     Objective: Physical Exam General: The patient is alert and oriented x3 in no acute distress.  Dermatology: Skin is cool, dry and supple bilateral lower extremities. Negative for open lesions or macerations.  Vascular: Palpable pedal pulses bilaterally. No edema or erythema noted. Capillary refill within normal limits.  Neurological: Epicritic and protective threshold grossly intact bilaterally.   Musculoskeletal Exam: Clinical evidence of bunion deformity noted to the respective foot.  No appreciable pain on palpation or range of motion of the first MPJ. Lateral deviation of the hallux noted consistent with hallux abductovalgus.  Radiographic Exam B/L feet 05/16/2024: Normal osseous mineralization.  No acute fractures identified.  Increased intermetatarsal with hallux abductus angle noted on AP view.  Assessment: 1.  Hallux valgus bilateral   Plan of Care:  -Patient was evaluated. X-Rays reviewed. -Currently the patient's bunions are minimally symptomatic and the only symptom she experiences is numbness to the adjacent second digit of her left foot.  For now continue to pursue conservative treatment management -Continue wearing good supportive tennis shoes that are wide fitting and do not irritate or constrict the toebox area -Return to clinic PRN   Thresa EMERSON Sar, DPM [facility]  Dr. Thresa EMERSON Sar, DPM    [address] [place], [place] [address]                Office [phone]  Fax [phone]

## 2024-05-18 ENCOUNTER — Other Ambulatory Visit: Payer: Self-pay

## 2024-05-18 ENCOUNTER — Telehealth: Payer: Self-pay

## 2024-05-18 DIAGNOSIS — D509 Iron deficiency anemia, unspecified: Secondary | ICD-10-CM

## 2024-05-18 NOTE — Telephone Encounter (Signed)
 Left message for patient about upcoming appointment on 10/16

## 2024-05-19 ENCOUNTER — Encounter: Payer: Self-pay | Admitting: Hematology and Oncology

## 2024-05-19 ENCOUNTER — Inpatient Hospital Stay

## 2024-05-19 ENCOUNTER — Inpatient Hospital Stay: Attending: Internal Medicine | Admitting: Hematology and Oncology

## 2024-05-19 ENCOUNTER — Other Ambulatory Visit: Payer: Self-pay | Admitting: Gastroenterology

## 2024-05-19 ENCOUNTER — Other Ambulatory Visit (HOSPITAL_COMMUNITY): Payer: Self-pay

## 2024-05-19 VITALS — BP 153/85 | HR 80 | Temp 98.0°F | Resp 16 | Wt 215.4 lb

## 2024-05-19 DIAGNOSIS — K5900 Constipation, unspecified: Secondary | ICD-10-CM | POA: Diagnosis not present

## 2024-05-19 DIAGNOSIS — K254 Chronic or unspecified gastric ulcer with hemorrhage: Secondary | ICD-10-CM | POA: Diagnosis not present

## 2024-05-19 DIAGNOSIS — M810 Age-related osteoporosis without current pathological fracture: Secondary | ICD-10-CM | POA: Insufficient documentation

## 2024-05-19 DIAGNOSIS — D508 Other iron deficiency anemias: Secondary | ICD-10-CM

## 2024-05-19 DIAGNOSIS — Z87891 Personal history of nicotine dependence: Secondary | ICD-10-CM | POA: Insufficient documentation

## 2024-05-19 DIAGNOSIS — D509 Iron deficiency anemia, unspecified: Secondary | ICD-10-CM | POA: Diagnosis not present

## 2024-05-19 LAB — CBC WITH DIFFERENTIAL (CANCER CENTER ONLY)
Abs Immature Granulocytes: 0 K/uL (ref 0.00–0.07)
Basophils Absolute: 0 K/uL (ref 0.0–0.1)
Basophils Relative: 1 %
Eosinophils Absolute: 0.1 K/uL (ref 0.0–0.5)
Eosinophils Relative: 4 %
HCT: 37.3 % (ref 36.0–46.0)
Hemoglobin: 12.3 g/dL (ref 12.0–15.0)
Immature Granulocytes: 0 %
Lymphocytes Relative: 42 %
Lymphs Abs: 1.7 K/uL (ref 0.7–4.0)
MCH: 29.4 pg (ref 26.0–34.0)
MCHC: 33 g/dL (ref 30.0–36.0)
MCV: 89 fL (ref 80.0–100.0)
Monocytes Absolute: 0.4 K/uL (ref 0.1–1.0)
Monocytes Relative: 9 %
Neutro Abs: 1.8 K/uL (ref 1.7–7.7)
Neutrophils Relative %: 44 %
Platelet Count: 198 K/uL (ref 150–400)
RBC: 4.19 MIL/uL (ref 3.87–5.11)
RDW: 13.8 % (ref 11.5–15.5)
WBC Count: 4 K/uL (ref 4.0–10.5)
nRBC: 0 % (ref 0.0–0.2)

## 2024-05-19 LAB — CMP (CANCER CENTER ONLY)
ALT: 11 U/L (ref 0–44)
AST: 17 U/L (ref 15–41)
Albumin: 3.8 g/dL (ref 3.5–5.0)
Alkaline Phosphatase: 75 U/L (ref 38–126)
Anion gap: 4 — ABNORMAL LOW (ref 5–15)
BUN: 11 mg/dL (ref 8–23)
CO2: 26 mmol/L (ref 22–32)
Calcium: 8.8 mg/dL — ABNORMAL LOW (ref 8.9–10.3)
Chloride: 111 mmol/L (ref 98–111)
Creatinine: 0.95 mg/dL (ref 0.44–1.00)
GFR, Estimated: >60 mL/min (ref >60)
Glucose, Bld: 108 mg/dL — ABNORMAL HIGH (ref 70–99)
Potassium: 3.4 mmol/L — ABNORMAL LOW (ref 3.5–5.1)
Sodium: 141 mmol/L (ref 135–145)
Total Bilirubin: 0.3 mg/dL (ref 0.0–1.2)
Total Protein: 6.2 g/dL — ABNORMAL LOW (ref 6.5–8.1)

## 2024-05-19 LAB — IRON AND IRON BINDING CAPACITY (CC-WL,HP ONLY)
Iron: 33 ug/dL (ref 28–170)
Saturation Ratios: 9 % — ABNORMAL LOW (ref 10.4–31.8)
TIBC: 368 ug/dL (ref 250–450)
UIBC: 335 ug/dL (ref 148–442)

## 2024-05-19 LAB — FERRITIN: Ferritin: 13 ng/mL (ref 11–307)

## 2024-05-19 MED ORDER — FERROUS GLUCONATE 324 (38 FE) MG PO TABS
324.0000 mg | ORAL_TABLET | Freq: Every day | ORAL | 2 refills | Status: AC
  Filled 2024-05-19: qty 30, 30d supply, fill #0

## 2024-05-19 NOTE — Progress Notes (Signed)
 [facility] CONSULT NOTE  Patient Care Team: Purcell Emil Schanz, MD as PCP - General (Internal Medicine) Anner Alm ORN, MD as PCP - Cardiology (Cardiology) Brenna Adine CROME, DO as Consulting Physician (Pulmonary Disease) Teressa Toribio SQUIBB, MD (Inactive) as Attending Physician (Gastroenterology)  CHIEF COMPLAINTS/PURPOSE OF CONSULTATION:  IDA Assessment & Plan  Iron  deficiency anemia Iron  deficiency anemia improved with treatment. Hemoglobin increased to 12.3. Iron  stores remain low, requiring continued supplementation. - Recommend ferrous sulfate , one pill every other day for 3 to 6 months. - Advise increased water intake to mitigate constipation. - Suggest prune juice if constipation persists.  Gastric ulcer Gastric ulcers with bleeding contributing to anemia. Managed with pantoprazole . - Continue recommendations per GI team  Constipation Constipation managed with Linzess . - Continue Linzess .  HISTORY OF PRESENTING ILLNESS:  [name] 68 y.o. female is here because of IDA  History of Present Illness  Discussed the use of AI scribe software for clinical note transcription with the patient, who gave verbal consent to proceed.  History of Present Illness  [name] is a 68 year old female with a history of stomach ulcers and anemia who presents for follow-up on her anemia and iron  levels.  A recent endoscopy revealed stomach ulcers, could have contributed to IDA. She is currently in her last week of the increased dosage of pantoprazole  and will reduce to once daily next week.  She has not yet started on iron  pills. She takes Linzess  to manage constipation, a potential side effect of iron  supplements.  She has a history of a goiter with a nodule on the left side that requires a biopsy. She is awaiting an appointment with endocrinology for further evaluation.  In the review of symptoms, she notes improvement in fatigue. No current pica, blood in  stool, or black stools. Reports occasional leg swelling that comes and goes.  All other systems were reviewed with the patient and are negative.  MEDICAL HISTORY:  Past Medical History:  Diagnosis Date   Allergy    [name], Flonase    Anemia    Anxiety    Arthritis    Chronic kidney disease    Colon polyps    Depression    Fibromyalgia    Gastritis    GERD (gastroesophageal reflux disease)    Hernia, hiatal    High risk for colon cancer    HLD (hyperlipidemia)    Hypertension    Migraine    Neuropathy    Osteoporosis    Pneumonia    Sleep apnea    c-pap nightly   Sleep apnea with use of continuous positive airway pressure (CPAP)    Thyroid  goiter     SURGICAL HISTORY: Past Surgical History:  Procedure Laterality Date   48-Hour Holter Monitor  06/2017   Mostly normal sinus rhythm.  Average heart rate 71 bpm.  Minimum 54 bpm.  Maximum sinus rate 114 bpm.  Rare PACs and PVCs.  No A. fib, atrial flutter, SVT or VT.  One brief run of 8 beat PAT.   ABDOMINAL HYSTERECTOMY     ovaries intact; DUB.  No dysplasia.   FRACTURE SURGERY N/A    Phreesia 09/25/2020   JOINT REPLACEMENT     OPEN REDUCTION INTERNAL FIXATION (ORIF) DISTAL RADIAL FRACTURE Right 07/09/2017   Procedure: RIGHT WRIST OPEN REDUCTION INTERNAL FIXATION (ORIF) DISTAL RADIAL FRACTURE AND REPAIR AS INDICATED;  Surgeon: Shari Easter, MD;  Location: [facility] OR;  Service: Orthopedics;  Laterality: Right;  PARTIAL HYSTERECTOMY     ROTATOR CUFF REPAIR Right 2009   SHOULDER SURGERY Right    shoulder dislocation, fall, and elbow unla nerve   SHOULDER SURGERY Right 2015   labral tear   TRANSTHORACIC ECHOCARDIOGRAM  06/2017    EF 55-60%.  grade 1 diastolic dysfunction.  Otherwise normal   TUBAL LIGATION     ULNAR NERVE REPAIR Right 08/19/2014    SOCIAL HISTORY: Social History   Socioeconomic History   Marital status: Married    Spouse name: Not on file   Number of children: 3   Years of education: Not on file    Highest education level: GED or equivalent  Occupational History   Occupation: unemployed  Tobacco Use   Smoking status: Former    Current packs/day: 0.00    Types: Cigarettes    Quit date: 08/04/1998    Years since quitting: 25.8   Smokeless tobacco: Never  Vaping Use   Vaping status: Never Used  Substance and Sexual Activity   Alcohol use: No   Drug use: No   Sexual activity: Yes    Birth control/protection: Surgical, Post-menopausal  Other Topics Concern   Not on file  Social History Narrative   Marital status: married x 20 years; moderately happy     Children: 3 children (47 Kayla, 61 Dianna, 40 April); 8 grandchildren; 0 gg     Lives: with husband/Steve, April, 2 granddaughters     Employment:  Unemployed; quit working 2015 with work related injury; awaiting disability in 2018; disability approved in 02/2017.       Tobacco: quit smoking in 2016; smoked x 2 years.      Alcohol: none      Drugs: none      Exercise: rarely in 2018         Social Drivers of Health   Financial Resource Strain: Low Risk  (04/25/2024)   Overall Financial Resource Strain (CARDIA)    Difficulty of Paying Living Expenses: Not hard at all  Food Insecurity: No Food Insecurity (04/25/2024)   Hunger Vital Sign    Worried About Running Out of Food in the Last Year: Never true    Ran Out of Food in the Last Year: Never true  Transportation Needs: No Transportation Needs (04/25/2024)   PRAPARE - Administrator, Civil Service (Medical): No    Lack of Transportation (Non-Medical): No  Physical Activity: Inactive (03/31/2023)   Exercise Vital Sign    Days of Exercise per Week: 0 days    Minutes of Exercise per Session: 0 min  Stress: Stress Concern Present (04/25/2024)   [employer] of Occupational Health - Occupational Stress Questionnaire    Feeling of Stress: Rather much  Social Connections: Socially Integrated (04/25/2024)   Social Connection and Isolation Panel    Frequency of  Communication with Friends and Family: More than three times a week    Frequency of Social Gatherings with Friends and Family: Once a week    Attends Religious Services: More than 4 times per year    Active Member of [employer] or Organizations: Yes    Attends Banker Meetings: More than 4 times per year    Marital Status: Married  Catering manager Violence: Not At Risk (03/31/2023)   Humiliation, Afraid, Rape, and Kick questionnaire    Fear of Current or Ex-Partner: No    Emotionally Abused: No    Physically Abused: No    Sexually Abused: No  FAMILY HISTORY: Family History  Problem Relation Age of Onset   Heart disease Mother 72       AMI/CAD/CHF as cause of death   Hypertension Mother    Hyperlipidemia Mother    Hypothyroidism Mother    Depression Mother    Gout Mother    Heart disease Father 66       AMI   Hypertension Father    Stroke Father 85       mild CVA   Prostate cancer Father    Hyperlipidemia Father    Cancer Father 43       prostate cancer   Cancer Sister        Basal cell carcinoma scalp   Depression Sister    Arthritis Sister    Depression Sister    Cancer Maternal Grandmother        type unknown   Cancer Maternal Grandfather        type unknown   Heart disease Paternal Grandmother    Heart defect Paternal Grandfather    Hypertension Brother    Hyperlipidemia Brother    Cancer Brother        prostate cancer   Melanoma Brother    Hyperlipidemia Brother    Hypertension Brother    Cancer Brother        melanoma   Hypertension Brother    Hyperlipidemia Brother    Melanoma Brother    Cancer Brother 25       melanoma   Hypertension Brother    Depression Brother    Hypertension Brother    Gout Brother    Colon cancer Neg Hx    Esophageal cancer Neg Hx    Stomach cancer Neg Hx    Rectal cancer Neg Hx    Breast cancer Neg Hx     ALLERGIES:  is allergic to sulfa antibiotics.  MEDICATIONS:  Current Outpatient Medications   Medication Sig Dispense Refill   albuterol  (PROAIR  HFA) 108 (90 Base) MCG/ACT inhaler Inhale 1-2 puffs into the lungs every 4 (four) hours as needed for wheezing or shortness of breath. 18 g 0   atorvastatin  (LIPITOR) 20 MG tablet Take 1 tablet (20 mg total) by mouth daily. 90 tablet 3   benzonatate  (TESSALON ) 100 MG capsule Take 1 capsule (100 mg total) by mouth 3 (three) times daily as needed for cough. 30 capsule 0   busPIRone  (BUSPAR ) 7.5 MG tablet Take 1 tablet (7.5 mg total) by mouth 2 (two) times daily. 180 tablet 1   butalbital -acetaminophen -caffeine  (FIORICET ) 50-325-40 MG tablet Take 1 tablet by mouth every 6 (six) hours as needed for headache. 20 tablet 0   cyclobenzaprine  (FLEXERIL ) 10 MG tablet Take 1 tablet (10 mg total) by mouth 3 (three) times daily as needed for muscle spasms. 180 tablet 1   DULoxetine  (CYMBALTA ) 60 MG capsule Take 1 capsule (60 mg total) by mouth daily. 90 capsule 1   empagliflozin  (JARDIANCE ) 10 MG TABS tablet Take 1 tablet (10 mg total) by mouth daily before breakfast. 90 tablet 3   fluticasone  (FLONASE ) 50 MCG/ACT nasal spray Place 1 spray into both nostrils daily. 16 g 6   furosemide  (LASIX ) 20 MG tablet Take 1 tablet (20 mg total) by mouth daily. 90 tablet 1   gabapentin  (NEURONTIN ) 300 MG capsule Take 1-2 capsules (300-600 mg total) by mouth 4 (four) times daily. 540 capsule 1   linaclotide  (LINZESS ) 72 MCG capsule Take 1 capsule (72 mcg total) by mouth daily before breakfast. 30  capsule 2   lisinopril  (ZESTRIL ) 20 MG tablet Take 1 tablet (20 mg total) by mouth daily. 90 tablet 1   metoprolol  succinate (TOPROL  XL) 25 MG 24 hr tablet Take 1 tablet (25 mg total) by mouth daily. 90 tablet 2   ondansetron  (ZOFRAN -ODT) 4 MG disintegrating tablet Dissolve 1 tablet (4 mg total) in mouth every 8 (eight) hours as needed for nausea or vomiting. 20 tablet 4   pantoprazole  (PROTONIX ) 40 MG tablet Take 1 tablet (40 mg total) by mouth 2 (two) times daily for 10 weeks,  THEN 1 tablet (40 mg total) daily. 180 tablet 3   potassium chloride  SA (KLOR-CON  M) 20 MEQ tablet Take 1 tablet (20 mEq total) by mouth daily. 90 tablet 2   semaglutide -weight management (WEGOVY ) 0.25 MG/0.5ML SOAJ SQ injection Inject 0.25 mg into the skin once a week. Increase dose to 0.5 mg weekly after 3 to 4 weeks if side effects tolerated 2 mL 3   topiramate  (TOPAMAX ) 50 MG tablet Take 1 tablet (50 mg total) by mouth 2 (two) times daily. 180 tablet 1   vortioxetine  HBr (TRINTELLIX ) 10 MG TABS tablet Take 1 tablet (10 mg total) by mouth daily. 90 tablet 1   zolpidem  (AMBIEN ) 5 MG tablet Take 1 tablet (5 mg total) by mouth at bedtime as needed for sleep. 15 tablet 1   No current facility-administered medications for this visit.     PHYSICAL EXAMINATION: ECOG PERFORMANCE STATUS: 0 - Asymptomatic  Vitals:   05/19/24 0931  BP: (!) 153/85  Pulse: 80  Resp: 16  Temp: 98 F (36.7 C)  SpO2: 98%   Filed Weights   05/19/24 0931  Weight: 215 lb 6.4 oz (97.7 kg)    GENERAL:alert, no distress and comfortable SKIN: skin color, texture, turgor are normal, no rashes or significant lesions EYES: normal, conjunctiva are pink and non-injected, sclera clear OROPHARYNX:no exudate, no erythema and lips, buccal mucosa, and tongue normal  NECK: supple, thyroid  normal size, non-tender, without nodularity LYMPH:  no palpable lymphadenopathy in the cervical, axillary LUNGS: clear to auscultation and percussion with normal breathing effort HEART: regular rate & rhythm and no murmurs and no lower extremity edema ABDOMEN:abdomen soft, non-tender and normal bowel sounds Musculoskeletal:no cyanosis of digits and no clubbing  PSYCH: alert & oriented x 3 with fluent speech NEURO: no focal motor/sensory deficits  LABORATORY DATA:  I have reviewed the data as listed Lab Results  Component Value Date   WBC 4.0 05/19/2024   HGB 12.3 05/19/2024   HCT 37.3 05/19/2024   MCV 89.0 05/19/2024   PLT 198  05/19/2024     Chemistry      Component Value Date/Time   NA 141 10/26/2023 1404   NA 139 09/26/2020 1427   K 4.5 10/26/2023 1404   CL 110 10/26/2023 1404   CO2 25 10/26/2023 1404   BUN 18 10/26/2023 1404   BUN 10 09/26/2020 1427   CREATININE 1.02 10/26/2023 1404   CREATININE 1.01 (H) 05/21/2016 1219      Component Value Date/Time   CALCIUM  9.0 10/26/2023 1404   ALKPHOS 61 10/26/2023 1404   AST 14 10/26/2023 1404   ALT 11 10/26/2023 1404   BILITOT 0.3 10/26/2023 1404   BILITOT <0.2 09/26/2020 1427       RADIOGRAPHIC STUDIES: I have personally reviewed the radiological images as listed and agreed with the findings in the report. DG Foot Complete Left Result Date: 05/16/2024 Please see detailed radiograph report in office note.  DG Foot Complete Right Result Date: 05/16/2024 Please see detailed radiograph report in office note.  DG SWALLOW FUNC OP MEDICARE SPEECH PATH Result Date: 05/15/2024 CLINICAL DATA:  Sensation of something stuck in the throat cough/GE reflux disease/other secondary diagnosis EXAM: MODIFIED BARIUM SWALLOW TECHNIQUE: Radiology physician's assistant performed the filming. Different consistencies of barium were administered orally to the patient by the Speech Pathologist. Imaging of the pharynx was performed in the lateral projection. FLUOROSCOPY: Radiation Exposure Index (as provided by the fluoroscopic device): 1 minute 24 seconds 15.48 mGy COMPARISON:  None Available. FINDINGS: Oro pharyngeal phase is normal without delay or residua. No penetration or aspiration identified. No diverticulum or web. No significant cricopharyngeus prominence. No sign of mucosal mass or obstruction. Different consistencies show a normal appearance as well. Small anterior osteophytes at C5-6 but no significant encroachment upon the esophagus. Brief evaluation of the lower pole of the esophagus shows that there is a hiatal hernia. No sign of delayed emptying or obstruction  however. IMPRESSION: Moderate to large hiatal hernia. No evidence of delayed emptying or obstruction however. Normal oro pharyngeal phase. Normal appearance of the cervical region of the esophagus. Please refer to the Speech Pathologists report for complete details and recommendations. Electronically Signed   By: Oneil Officer M.D.   On: 05/15/2024 11:10   US  THYROID  Result Date: 05/07/2024 CLINICAL DATA:  Goiter. EXAM: THYROID  ULTRASOUND TECHNIQUE: Ultrasound examination of the thyroid  gland and adjacent soft tissues was performed. COMPARISON:  None Available. FINDINGS: Parenchymal Echotexture: Moderately heterogenous Isthmus: 0.5 cm Right lobe: 5.8 x 1.9 x 2.3 cm Left lobe: 5.6 x 2.2 x 2.3 cm _________________________________________________________ Estimated total number of nodules >/= 1 cm: 5 Number of spongiform nodules >/=  2 cm not described below (TR1): 0 Number of mixed cystic and solid nodules >/= 1.5 cm not described below (TR2): 0 _________________________________________________________ Nodule 1 is a questionable nodule versus pseudo nodule in the right superior thyroid  lobe that measures up to 0.8 cm and does not meet criteria for biopsy or dedicated follow-up. Nodule 2 is the mildly hypoechoic nodule in the right mid thyroid  lobe that measures 0.6 x 0.6 x 0.9 cm. This nodule does not meet criteria for biopsy or dedicated follow-up. Nodule 3 in the right mid thyroid  lobe is a heterogeneous isoechoic nodule that measures 0.9 x 0.8 x 1.0 cm. This is a TR 3 nodule. Given size (<1.4 cm) and appearance, this nodule does NOT meet TI-RADS criteria for biopsy or dedicated follow-up. Nodule 4 is a heterogeneous isoechoic nodule in the right inferior thyroid  lobe. This nodule measures 2.2 x 1.0 x 2.1 cm. This is a TR 3 nodule. *Given size (>/= 1.5 - 2.4 cm) and appearance, a follow-up ultrasound in 1 year should be considered based on TI-RADS criteria. Nodule 5 is hypoechoic mildly complex cystic nodule in the  left superior thyroid  lobe that measures up to 0.6 cm and does not meet criteria for biopsy or dedicated follow-up. Nodule 6 is a heterogeneous and predominantly isoechoic nodule in the left mid thyroid  lobe. This nodule measures 1.1 x 1.0 x 1.0 cm. This is a TR 3 nodule. **Given size (>/= 2.5 cm) and appearance, fine needle aspiration of this mildly suspicious nodule should be considered based on TI-RADS criteria. Nodule 7 is a spongiform nodule in the left mid/inferior thyroid  lobe that measures up to 1.0 cm. This does not meet criteria for biopsy or dedicated follow-up. Nodule 8 is a heterogeneous isoechoic nodule in left inferior thyroid  lobe that  measures 2.8 x 2.0 x 2.5 cm. This is a TR 3 nodule. **Given size (>/= 2.5 cm) and appearance, fine needle aspiration of this mildly suspicious nodule should be considered based on TI-RADS criteria. IMPRESSION: 1. Multinodular goiter. 2. Nodule 8 in the left inferior thyroid  lobe measures 2.8 cm and most compatible with a TR 3 nodule. This nodule meets criteria for ultrasound-guided biopsy. 3. Nodule 4 in the right inferior thyroid  lobe measures 2.2 cm and meets criteria for 1 year follow-up. The above is in keeping with the ACR TI-RADS recommendations - J Am Coll Radiol 2017;14:587-595. Electronically Signed   By: Juliene Balder M.D.   On: 05/07/2024 16:54    All questions were answered. The patient knows to call the clinic with any problems, questions or concerns. I spent 20 minutes in the care of this patient including H and P, review of records, counseling and coordination of care.     Amber Stalls, MD 05/19/2024 9:43 AM

## 2024-05-20 ENCOUNTER — Other Ambulatory Visit: Payer: Self-pay

## 2024-05-20 DIAGNOSIS — K449 Diaphragmatic hernia without obstruction or gangrene: Secondary | ICD-10-CM

## 2024-05-20 DIAGNOSIS — E041 Nontoxic single thyroid nodule: Secondary | ICD-10-CM

## 2024-05-20 NOTE — Telephone Encounter (Signed)
-----   Message from Cathryne PARAS May sent at 05/16/2024  5:11 PM EDT ----- [name]- Let Kabler know that the swallow study showed the moderate to large hiatal hernia which she is aware of.  It looks like the exam was normal when she swallowed from her mouth into the back of her  throat.  Cervical region of the neck look normal.  No aspiration.  It does note that the patient has small anterior osteophytes (spurs) at C5-6 in the cervical column but no significant protrusion  that would affect her swallowing.  Would recommend she follow-up with endocrinology as we discussed and then she can touch base with me on how the appointment goes to reevaluate  Deanna, NP ----- Message ----- From: Interface, Rad Results In Sent: 05/15/2024  11:13 AM EDT To: Cathryne PARAS May, NP

## 2024-05-23 ENCOUNTER — Other Ambulatory Visit: Payer: Self-pay | Admitting: Emergency Medicine

## 2024-05-23 DIAGNOSIS — I10 Essential (primary) hypertension: Secondary | ICD-10-CM

## 2024-05-24 ENCOUNTER — Other Ambulatory Visit (HOSPITAL_COMMUNITY): Payer: Self-pay

## 2024-05-24 ENCOUNTER — Encounter: Payer: Self-pay | Admitting: Hematology and Oncology

## 2024-05-24 MED ORDER — FLUZONE HIGH-DOSE 0.5 ML IM SUSY
0.5000 mL | PREFILLED_SYRINGE | Freq: Once | INTRAMUSCULAR | 0 refills | Status: AC
  Filled 2024-05-24: qty 0.5, 1d supply, fill #0

## 2024-05-24 MED ORDER — COVID-19 MRNA VAC-TRIS(PFIZER) 30 MCG/0.3ML IM SUSY
0.3000 mL | PREFILLED_SYRINGE | Freq: Once | INTRAMUSCULAR | 0 refills | Status: AC
  Filled 2024-05-24: qty 0.3, 1d supply, fill #0

## 2024-05-24 MED ORDER — LISINOPRIL 20 MG PO TABS
20.0000 mg | ORAL_TABLET | Freq: Every day | ORAL | 1 refills | Status: AC
  Filled 2024-05-24: qty 90, 90d supply, fill #0
  Filled 2024-08-21: qty 90, 90d supply, fill #1
  Filled 2024-08-22: qty 90, 90d supply, fill #0

## 2024-05-25 ENCOUNTER — Encounter: Payer: Self-pay | Admitting: Neurology

## 2024-05-25 ENCOUNTER — Ambulatory Visit: Admitting: Neurology

## 2024-05-25 VITALS — BP 152/89 | HR 89 | Ht 64.0 in | Wt 217.0 lb

## 2024-05-25 DIAGNOSIS — G478 Other sleep disorders: Secondary | ICD-10-CM | POA: Diagnosis not present

## 2024-05-25 DIAGNOSIS — G4733 Obstructive sleep apnea (adult) (pediatric): Secondary | ICD-10-CM

## 2024-05-25 NOTE — Progress Notes (Signed)
 [name]

## 2024-05-25 NOTE — Patient Instructions (Signed)
 Living With Sleep Apnea Sleep apnea is a condition that affects your breathing while you're sleeping. Your tongue or the tissue in your throat may block the flow of air while you sleep. You may have shallow breathing or stop breathing for short periods of time. The breaks in breathing interrupt the deep sleep that you need to feel rested. Even if you don't wake up from the gaps in breathing, you may feel tired during the day. People with sleep apnea may snore loudly. You may have a headache in the morning and feel anxious or depressed. How can sleep apnea affect me? Sleep apnea increases your chances of being very tired during the day. This is called daytime fatigue. Sleep apnea can also increase your risk of: Heart attack. Stroke. Obesity. Type 2 diabetes. Heart failure. Irregular heartbeat. High blood pressure. If you are very tired during the day, you may be more likely to: Not do well in school or at work. Fall asleep while driving. Have trouble paying attention. Develop depression or anxiety. Have problems having sex. This is called sexual dysfunction. What actions can I take to manage sleep apnea? Sleep apnea treatment  If you were given a device to open your airway while you sleep, use it only as told by your health care provider. You may be given: An oral appliance. This is a mouthpiece that shifts your lower jaw forward. A continuous positive airway pressure (CPAP) device. This blows air through a mask. A nasal expiratory positive airway pressure (EPAP) device. This has valves that you put into each nostril. A bi-level positive airway pressure (BIPAP) device. This blows air through a mask when you breathe in and breathe out. You may need surgery if other treatments don't work for you. Sleep habits Go to sleep and wake up at the same time every day. This helps set your internal clock for sleeping. If you stay up later than usual on weekends, try to get up in the morning  within 2 hours of the time you usually wake up. Try to get at least 7-9 hours of sleep each night. Stop using a computer, tablet, and mobile phone a few hours before bedtime. Do not take long naps during the day. If you nap, limit it to 30 minutes. Have a relaxing bedtime routine. Reading or listening to music may relax you and help you sleep. Use your bedroom only for sleep. Keep your television and computer out of your bedroom. Keep your bedroom cool, dark, and quiet. Use a supportive mattress and pillows. Follow your provider's instructions for other changes to sleep habits. Nutrition Do not eat big meals in the evening. Do not have caffeine in the later part of the day. The effects of caffeine can last for more than 5 hours. Follow your provider's instructions for any changes to what you eat and drink. Lifestyle Do not drink alcohol before bedtime. Alcohol can cause you to fall asleep at first, but then it can cause you to wake up in the middle of the night and have trouble getting back to sleep. Do not smoke, vape, or use nicotine or tobacco. Medicines Take over-the-counter and prescription medicines only as told by your provider. Do not use over-the-counter sleep medicine. You may become dependent on this medicine, and it can make sleep apnea worse. Do not take medicines, such as sedatives and narcotics, unless told to by your provider. Activity Exercise on most days, but avoid exercising in the evening. Exercising near bedtime can interfere  with sleeping. If possible, spend time outside every day. Natural light helps with your internal clock. General information Lose weight if you need to. Stay at a healthy weight. If you are having surgery, make sure to tell your provider that you have sleep apnea. You may need to bring your device with you. Keep all follow-up visits. Your provider will want to check on your condition. Where to find more information National Heart, Lung, and  Blood Institute: [employer] This information is not intended to replace advice given to you by your health care provider. Make sure you discuss any questions you have with your health care provider. Document Revised: 11/12/2022 Document Reviewed: 11/12/2022 Elsevier Patient Education  2024 [employer]Quality Sleep Information, Adult Quality sleep is important for your mental and physical health. It also improves your quality of life. Quality sleep means you: Are asleep for most of the time you are in bed. Fall asleep within 30 minutes. Wake up no more than once a night. Are awake for no longer than 20 minutes if you do wake up during the night. Most adults need 7-8 hours of quality sleep each night. How can poor sleep affect me? If you do not get enough quality sleep, you may have: Mood swings. Daytime sleepiness. Decreased alertness, reaction time, and concentration. Sleep disorders, such as insomnia and sleep apnea. Difficulty with: Solving problems. Coping with stress. Paying attention. These issues may affect your performance and productivity at work, school, and home. Lack of sleep may also put you at higher risk for accidents, suicide, and risky behaviors. If you do not get quality sleep, you may also be at higher risk for several health problems, including: Infections. Type 2 diabetes. Heart disease. High blood pressure. Obesity. Worsening of long-term conditions, like arthritis, kidney disease, depression, Parkinson's disease, and epilepsy. What actions can I take to get more quality sleep? Sleep schedule and routine Stick to a sleep schedule. Go to sleep and wake up at about the same time each day. Do not try to sleep less on weekdays and make up for lost sleep on weekends. This does not work. Limit naps during the day to 30 minutes or less. Do not take naps in the late afternoon. Make time to relax before bed. Reading, listening to music, or taking a hot bath promotes  quality sleep. Make your bedroom a place that promotes quality sleep. Keep your bedroom dark, quiet, and at a comfortable room temperature. Make sure your bed is comfortable. Avoid using electronic devices that give off bright blue light for 30 minutes before bedtime. Your brain perceives bright blue light as sunlight. This includes television, phones, and computers. If you are lying awake in bed for longer than 20 minutes, get up and do a relaxing activity until you feel sleepy. Lifestyle     Try to get at least 30 minutes of exercise on most days. Do not exercise 2-3 hours before going to bed. Do not use any products that contain nicotine or tobacco. These products include cigarettes, chewing tobacco, and vaping devices, such as e-cigarettes. If you need help quitting, ask your health care provider. Do not drink caffeinated beverages for at least 8 hours before going to bed. Coffee, tea, and some sodas contain caffeine. Do not drink alcohol or eat large meals close to bedtime. Try to get at least 30 minutes of sunlight every day. Morning sunlight is best. Medical concerns Work with your health care provider to treat medical conditions that may affect sleeping, such  as: Nasal obstruction. Snoring. Sleep apnea and other sleep disorders. Talk to your health care provider if you think any of your prescription medicines may cause you to have difficulty falling or staying asleep. If you have sleep problems, talk with a sleep consultant. If you think you have a sleep disorder, talk with your health care provider about getting evaluated by a specialist. Where to find more information Sleep Foundation: sleepfoundation.org American Academy of Sleep Medicine: aasm.org Centers for Disease Control and Prevention (CDC): TonerPromos.no Contact a health care provider if: You have trouble getting to sleep or staying asleep. You often wake up very early in the morning and cannot get back to sleep. You have  daytime sleepiness. You have daytime sleep attacks of suddenly falling asleep and sudden muscle weakness (narcolepsy). You have a tingling sensation in your legs with a strong urge to move your legs (restless legs syndrome). You stop breathing briefly during sleep (sleep apnea). You think you have a sleep disorder or are taking a medicine that is affecting your quality of sleep. Summary Most adults need 7-8 hours of quality sleep each night. Getting enough quality sleep is important for your mental and physical health. Make your bedroom a place that promotes quality sleep, and avoid things that may cause you to have poor sleep, such as alcohol, caffeine, smoking, or large meals. Talk to your health care provider if you have trouble falling asleep or staying asleep. This information is not intended to replace advice given to you by your health care provider. Make sure you discuss any questions you have with your health care provider. Document Revised: 11/13/2021 Document Reviewed: 11/13/2021 Elsevier Patient Education  2024 [employer].

## 2024-05-25 NOTE — Progress Notes (Signed)
 @[facility]   Provider:  Dedra Gores, MD  Primary Care Physician:  Purcell Emil Schanz, MD [address] [place] [address]  Referring Provider: Purcell Emil [place], [place] [address] [place],  [place] [address]        Chief Concern for this Consultation:   Patient presents with          HPI: I have the pleasure of meeting with [name] , on 05/25/24 , who is a new sleep patient , a  68 y.o.  female patient,  seen upon a referral by Sagardia   for a  Sleep Medicine Consultation.  The patient's referral information asked for a new evaluation based on cardiologists concerns about untreated sleep apnea.   Her current machine was issued in 2012, [facility] , she discontinued CPAP use after she saw rust on the heating plate of the humidifier chamber, may be 2 months ago.   Set at 8 cm water with a 4 cm start pressure and 45 minute Ramp.   She uses a wisp Nasal mask by [place] [employer].  She needs a new machine and new testing.   Chief concern according to patient:  sleep study was at [facility] .    Presenting  with a medical history of  Past Medical History:  Diagnosis Date   Allergy, nasal - rhinitis , bronchitis, sinusitis.     [name], Flonase    Anemia, recently , GI ulcer bleeding. , iron  infusions helped.     Anxiety    Arthritis    Chronic kidney disease    Colon polyps    Depression    Diastolic dysfunction, heart disease.     Gastritis - ulcer     GERD (gastroesophageal reflux disease)    Hernia, hiatal    High risk for colon cancer    HLD (hyperlipidemia)    Hypertension    Migraine and morning headaches     COVID  04-2024    Osteoporosis    Pneumonia    Sleep apnea    c-pap nightly until recently    Sleep apnea with use of continuous positive airway pressure (CPAP)    Thyroid  goiter      has a past medical history of Allergy, Anemia, Anxiety, Arthritis, Chronic kidney disease, Colon polyps, Depression, Fibromyalgia, Gastritis, GERD  (gastroesophageal reflux disease), Hernia, hiatal, High risk for colon cancer, HLD (hyperlipidemia), Hypertension, Migraine, Neuropathy, Osteoporosis, Pneumonia, Sleep apnea, Sleep apnea with use of continuous positive airway pressure (CPAP), and Thyroid  goiter.. She is scheduled for a thyroid  biopsy.ROS.Mood disorders, Depression and anxiety, chronic pain.    Family medical history: There are known biological family members affected by Sleep apnea ( son has OSA and was on BiPAP ),    Social history: HS graduate -  [name]  is disabled from an injury 2015, daytime, [employer] company- . She lives in a private home, in a household with spouse and has 2 dogs .  This patient is  a parent of 2 children. 8 siblings. Husband works nights.   Nicotine use: /.  ETOH use: /,  Caffeine  intake in form of: Coffee (/), Soft drinks ( one a day), Tea ( /) or Energy drinks ( including those containing  taurine ). Caffeine  is last consumed at any time of the day. Exercises regularly in form of  - nothing .  Hobbies / Volunteering/, ( not a member of a club, church or other community memberships and engagements).  Sleep habits and routines are as follows: The patient's dinner time is around ' ANYTIME  PM.   The patient goes to bed at, or close to, 11 Pm through 3 AM ( no set time)  The bedroom is shared with husband who works night shifts and is described as cool, quiet, and not dark.  The patient reports that it takes 20-60 minutes to fall asleep, then continues to sleep for 6-7 hours, uninterrupted or woken up by one time by the need to void (Nocturia).   The preferred sleep position is on her  side, with support of 1 pillows, (non- adjustable bed/  flat ). The total estimated sleep time is circa 6-7 hours.  Dreams are reportedly rare. Dream enactment has not been reported.   Variable time in  AM is  rise time.  Between 9 and 12  noon. The patient wakes up spontaneously - .  she reports sometimes  feeling refreshed and restored in the morning, waking with symptoms such as dry mouth, morning headaches, stiffness or pain, and fatigue.  No sleep paralysis has been experienced.   Naps in daytime are taken infrequently (there is a desire to nap and opportunity), lasting from 15 to 30 and have a refreshing quality.  These do not interfere with nocturnal sleep.    Review of Systems: Out of a complete 14 system review, the patient complains of only the following symptoms, and all other reviewed systems are negative.:  Hypersomnia   Insomnia - poor sleep routines,   Depression/ anxiety: evident   Pain:  chronic pain treatment   Headaches in AM     Snoring, Sleep fragmentation, Nocturia   How likely are you to doze in the following situations: 0 = not likely, 1 = slight chance, 2 = moderate chance, 3 = high chance Sitting and Reading? Watching Television? Sitting inactive in a public place (theater or meeting)? As a passenger in a car for an hour without a break? Lying down in the afternoon when circumstances permit? Sitting and talking to someone? Sitting quietly after lunch without alcohol? In a car, while stopped for a few minutes in traffic?   Total ESS =5 / 24 points.    FSS endorsed at 29/ 63 points.  The patient did not answer these questions.  GDS:  10/ 15 ; highly depressed.  Social History   Socioeconomic History   Marital status: Married    Spouse name: Not on file   Number of children: 3   Years of education: Not on file   Highest education level: GED or equivalent  Occupational History   Occupation: unemployed  Tobacco Use   Smoking status: Former    Current packs/day: 0.00    Types: Cigarettes    Quit date: 08/04/1998    Years since quitting: 25.8   Smokeless tobacco: Never  Vaping Use   Vaping status: Never Used  Substance and Sexual Activity   Alcohol use: No   Drug use: No   Sexual activity: Yes    Birth control/protection: Surgical, Post-menopausal   Other Topics Concern   Not on file  Social History Narrative   Marital status: married x 20 years; moderately happy     Children: 3 children (47 Kayla, 77 Dianna, 40 April); 8 grandchildren; 0 gg     Lives: with husband/Steve, April, 2 granddaughters     Employment:  Unemployed; quit working 2015 with work related injury; awaiting disability in 2018; disability approved in 02/2017.  Tobacco: quit smoking in 2016; smoked x 2 years.      Alcohol: none      Drugs: none      Exercise: rarely in 2018         Social Drivers of Health   Financial Resource Strain: Low Risk  (04/25/2024)   Overall Financial Resource Strain (CARDIA)    Difficulty of Paying Living Expenses: Not hard at all  Food Insecurity: No Food Insecurity (04/25/2024)   Hunger Vital Sign    Worried About Running Out of Food in the Last Year: Never true    Ran Out of Food in the Last Year: Never true  Transportation Needs: No Transportation Needs (04/25/2024)   PRAPARE - Administrator, Civil Service (Medical): No    Lack of Transportation (Non-Medical): No  Physical Activity: Inactive (03/31/2023)   Exercise Vital Sign    Days of Exercise per Week: 0 days    Minutes of Exercise per Session: 0 min  Stress: Stress Concern Present (04/25/2024)   [employer] of Occupational Health - Occupational Stress Questionnaire    Feeling of Stress: Rather much  Social Connections: Socially Integrated (04/25/2024)   Social Connection and Isolation Panel    Frequency of Communication with Friends and Family: More than three times a week    Frequency of Social Gatherings with Friends and Family: Once a week    Attends Religious Services: More than 4 times per year    Active Member of [employer] or Organizations: Yes    Attends Engineer, structural: More than 4 times per year    Marital Status: Married    Family History  Problem Relation Age of Onset   Heart disease Mother 7       AMI/CAD/CHF as cause of  death   Hypertension Mother    Hyperlipidemia Mother    Hypothyroidism Mother    Depression Mother    Gout Mother    Heart disease Father 70       AMI   Hypertension Father    Stroke Father 60       mild CVA   Prostate cancer Father    Hyperlipidemia Father    Cancer Father 41       prostate cancer   Cancer Sister        Basal cell carcinoma scalp   Depression Sister    Arthritis Sister    Depression Sister    Cancer Maternal Grandmother        type unknown   Cancer Maternal Grandfather        type unknown   Heart disease Paternal Grandmother    Heart defect Paternal Grandfather    Hypertension Brother    Hyperlipidemia Brother    Cancer Brother        prostate cancer   Melanoma Brother    Hyperlipidemia Brother    Hypertension Brother    Cancer Brother        melanoma   Hypertension Brother    Hyperlipidemia Brother    Melanoma Brother    Cancer Brother 25       melanoma   Hypertension Brother    Depression Brother    Hypertension Brother    Gout Brother    Colon cancer Neg Hx    Esophageal cancer Neg Hx    Stomach cancer Neg Hx    Rectal cancer Neg Hx    Breast cancer Neg Hx     Past Medical History:  Diagnosis Date   Allergy    [name], Flonase    Anemia    Anxiety    Arthritis    Chronic kidney disease    Colon polyps    Depression    Fibromyalgia    Gastritis    GERD (gastroesophageal reflux disease)    Hernia, hiatal    High risk for colon cancer    HLD (hyperlipidemia)    Hypertension    Migraine    Neuropathy    Osteoporosis    Pneumonia    Sleep apnea    c-pap nightly   Sleep apnea with use of continuous positive airway pressure (CPAP)    Thyroid  goiter     Past Surgical History:  Procedure Laterality Date   48-Hour Holter Monitor  06/2017   Mostly normal sinus rhythm.  Average heart rate 71 bpm.  Minimum 54 bpm.  Maximum sinus rate 114 bpm.  Rare PACs and PVCs.  No A. fib, atrial flutter, SVT or VT.  One brief run of 8 beat  PAT.   ABDOMINAL HYSTERECTOMY     ovaries intact; DUB.  No dysplasia.   FRACTURE SURGERY N/A    Phreesia 09/25/2020   JOINT REPLACEMENT     OPEN REDUCTION INTERNAL FIXATION (ORIF) DISTAL RADIAL FRACTURE Right 07/09/2017   Procedure: RIGHT WRIST OPEN REDUCTION INTERNAL FIXATION (ORIF) DISTAL RADIAL FRACTURE AND REPAIR AS INDICATED;  Surgeon: Shari Easter, MD;  Location: [facility] OR;  Service: Orthopedics;  Laterality: Right;   PARTIAL HYSTERECTOMY     ROTATOR CUFF REPAIR Right 2009   SHOULDER SURGERY Right    shoulder dislocation, fall, and elbow unla nerve   SHOULDER SURGERY Right 2015   labral tear   TRANSTHORACIC ECHOCARDIOGRAM  06/2017    EF 55-60%.  grade 1 diastolic dysfunction.  Otherwise normal   TUBAL LIGATION     ULNAR NERVE REPAIR Right 08/19/2014     Current Outpatient Medications on File Prior to Visit  Medication Sig Dispense Refill   albuterol  (PROAIR  HFA) 108 (90 Base) MCG/ACT inhaler Inhale 1-2 puffs into the lungs every 4 (four) hours as needed for wheezing or shortness of breath. 18 g 0   atorvastatin  (LIPITOR) 20 MG tablet Take 1 tablet (20 mg total) by mouth daily. 90 tablet 3   benzonatate  (TESSALON ) 100 MG capsule Take 1 capsule (100 mg total) by mouth 3 (three) times daily as needed for cough. 30 capsule 0   busPIRone  (BUSPAR ) 7.5 MG tablet Take 1 tablet (7.5 mg total) by mouth 2 (two) times daily. 180 tablet 1   butalbital -acetaminophen -caffeine  (FIORICET ) 50-325-40 MG tablet Take 1 tablet by mouth every 6 (six) hours as needed for headache. 20 tablet 0   COVID-19 mRNA vaccine, Pfizer, (COMIRNATY ) syringe Inject 0.3 mLs into the muscle once for 1 dose. 0.3 mL 0   cyclobenzaprine  (FLEXERIL ) 10 MG tablet Take 1 tablet (10 mg total) by mouth 3 (three) times daily as needed for muscle spasms. 180 tablet 1   DULoxetine  (CYMBALTA ) 60 MG capsule Take 1 capsule (60 mg total) by mouth daily. 90 capsule 1   empagliflozin  (JARDIANCE ) 10 MG TABS tablet Take 1 tablet (10 mg total)  by mouth daily before breakfast. 90 tablet 3   ferrous gluconate (FERGON) 324 MG tablet Take 1 tablet (324 mg total) by mouth daily with breakfast. 30 tablet 2   fluticasone  (FLONASE ) 50 MCG/ACT nasal spray Place 1 spray into both nostrils daily. 16 g 6   furosemide  (LASIX ) 20 MG tablet Take 1  tablet (20 mg total) by mouth daily. 90 tablet 1   gabapentin  (NEURONTIN ) 300 MG capsule Take 1-2 capsules (300-600 mg total) by mouth 4 (four) times daily. 540 capsule 1   Influenza vac split trivalent PF (FLUZONE HIGH-DOSE) 0.5 ML injection Inject 0.5 mLs into the muscle once for 1 dose. 0.5 mL 0   linaclotide  (LINZESS ) 72 MCG capsule Take 1 capsule (72 mcg total) by mouth daily before breakfast. 30 capsule 2   lisinopril  (ZESTRIL ) 20 MG tablet Take 1 tablet (20 mg total) by mouth daily. 90 tablet 1   metoprolol  succinate (TOPROL  XL) 25 MG 24 hr tablet Take 1 tablet (25 mg total) by mouth daily. 90 tablet 2   ondansetron  (ZOFRAN -ODT) 4 MG disintegrating tablet Dissolve 1 tablet (4 mg total) in mouth every 8 (eight) hours as needed for nausea or vomiting. 20 tablet 4   pantoprazole  (PROTONIX ) 40 MG tablet Take 1 tablet (40 mg total) by mouth 2 (two) times daily for 10 weeks, THEN 1 tablet (40 mg total) daily. 180 tablet 3   potassium chloride  SA (KLOR-CON  M) 20 MEQ tablet Take 1 tablet (20 mEq total) by mouth daily. 90 tablet 2   topiramate  (TOPAMAX ) 50 MG tablet Take 1 tablet (50 mg total) by mouth 2 (two) times daily. 180 tablet 1   vortioxetine  HBr (TRINTELLIX ) 10 MG TABS tablet Take 1 tablet (10 mg total) by mouth daily. 90 tablet 1   zolpidem  (AMBIEN ) 5 MG tablet Take 1 tablet (5 mg total) by mouth at bedtime as needed for sleep. 15 tablet 1   No current facility-administered medications on file prior to visit.    Allergies  Allergen Reactions   Sulfa Antibiotics Itching    Vitals:   05/25/24 0952  BP: (!) 152/89  Pulse: 89      Physical exam:   General: The patient was alert and appears  not in acute distress.  Mood and affect are appropriate .  The patient's interactions are: Cooperative, makes eye contact, follows the instructions , she answers not all questions coherently.  The patient is appropriately groomed and dressed. Head: Normocephalic, atraumatic.   Neck is supple. Mallampati: 1.  The neck circumference measured 15. 75 inches. Nasal airflow was patent ,   Overbite / Retrognathia was noted.  Dental status:  Cardiovascular:  Regular rate and cardiac rhythm by palpable pulse. Respiratory: no audible wheezing, no tachypnoea.   Skin:  Without evidence of ankle edema. No discoloration.  Trunk:  BMI is 37. Abdominal obesity  The patient's posture was erect.   Neurologic exam : The patient was awake and alert, oriented to place and time.   Attention span & concentration ability appeared normal.  Speech was fluent, without dysarthria, dysphonia or aphasia, and of normal volume.     Cranial nerves:  There was no loss of smell or taste reported  Pupils are round, equal in size and briskly reactive to light.  Funduscopic exam was deferred.  Extraocular movements in vertical and horizontal planes were intact and without nystagmus. (No Diplopia reported). Visual fields by finger perimetry are intact. Hearing was not  intact to soft voice.  - with hearing aids in place   Facial sensation intact to fine touch.  Facial motor strength: Symmetric movement and tongue and uvula move midline.  Neck ROM: rotation, tilt and flexion extension were intact for age and shoulder shrug was symmetrical.    Motor exam:  Symmetric bulk, strength and ROM.   Normal tone without  cog- wheeling, and symmetric grip strength.   Sensory:  vibration tested by tuning fork and intact.  Proprioception tested in the upper extremities was normal.   Coordination: The patient reported no problems with button closure and no changes to penmanship.   The Finger-to-nose maneuver was intact without  evidence of ataxia, dysmetria or tremor.   Gait and station: Patient could rise unassisted from a seated position, without bracing, and walked without assistive device.  .   Deep tendon reflexes: Upper extremities did show symmetric DTRs. Lower extremity DTRs were symmetric and brisk/      I would like to thank Purcell Emil Schanz, MD  for today's referral .    In short, Mrs Rando  is presenting with poor sleep habits, but is neither excessively fatigued nor sleepy. She hasn't been using her very old CPAP machine in a while and sleeps less well without it.    FSS was 29 and ESS was 5/ 24.  Risk factors for OSA were present,  including : Body mass index is 37.25 kg/m., neck size of over 16  in a woman,  and upper airway anatomy. I like to repeat a sleep study to see if CPAP is still needed.   I discussed sleep hygiene in detail :    My Plan is to proceed with:  HST/ PSG:  because of her very irregular sleep habits and hours a HST is likely the better option.   I plan to follow up personally or through our NP within months.   A total time of  55  minutes consistent of a part of face to face encounter , exam and interview,  and additional preparation time for chart review was spent .  At today's visit, we discussed treatment options, associated risk and benefits, and engage in counseling as needed including, but not limited to:  Sleep hygiene, Quality Sleep Habits, and Safety concerns for patients with daytime sleepiness who are warned to not operate machinery/ motor vehicles when drowsy. Risk factors for sleep apnea were identified:  Body mass index is 37.25 kg/m..  Additionally, the following were reviewed: Past medical records, past medical and surgical history, family and social background, as well as relevant laboratory results, imaging findings, and medical notes, where applicable.  This note was generated by myself in part by using dictation software, and as a result, it  may contain unintentional typos and errors.  Nevertheless, effort was made to accurately convey the pertinent aspects of the patient's visit.   Dedra Gores, MD  [facility] Sleep Board certified in Sleep Medicine by The [employer] of Sleep Medicine and Diplomate of the [employer] of [employer] (AASM) . Board certified In Neurology, Diplomat of the ABPN,  Fellow of the [employer] of Neu[employer].

## 2024-05-31 ENCOUNTER — Telehealth: Admitting: Family Medicine

## 2024-05-31 DIAGNOSIS — R6889 Other general symptoms and signs: Secondary | ICD-10-CM

## 2024-05-31 DIAGNOSIS — R52 Pain, unspecified: Secondary | ICD-10-CM

## 2024-05-31 NOTE — Progress Notes (Signed)
 Virtual Visit Consent   [name], you are scheduled for a virtual visit with a [facility] provider today. Just as with appointments in the office, your consent must be obtained to participate. Your consent will be active for this visit and any virtual visit you may have with one of our providers in the next 365 days. If you have a MyChart account, a copy of this consent can be sent to you electronically.  As this is a virtual visit, video technology does not allow for your provider to perform a traditional examination. This may limit your provider's ability to fully assess your condition. If your provider identifies any concerns that need to be evaluated in person or the need to arrange testing (such as labs, EKG, etc.), we will make arrangements to do so. Although advances in technology are sophisticated, we cannot ensure that it will always work on either your end or our end. If the connection with a video visit is poor, the visit may have to be switched to a telephone visit. With either a video or telephone visit, we are not always able to ensure that we have a secure connection.  By engaging in this virtual visit, you consent to the provision of healthcare and authorize for your insurance to be billed (if applicable) for the services provided during this visit. Depending on your insurance coverage, you may receive a charge related to this service.  I need to obtain your verbal consent now. Are you willing to proceed with your visit today? [name] has provided verbal consent on 05/31/2024 for a virtual visit (video or telephone). Chiquita CHRISTELLA Barefoot, NP  Date: 05/31/2024 11:16 AM   Virtual Visit via Video Note   I, Chiquita CHRISTELLA Barefoot, connected with  [name]  ([id number], 08-17-1955) on 05/31/24 at 11:15 AM EDT by a video-enabled telemedicine application and verified that I am speaking with the correct person using two identifiers.  Location: Patient: Virtual Visit Location  Patient: Home Provider: Virtual Visit Location Provider: Home Office   I discussed the limitations of evaluation and management by telemedicine and the availability of in person appointments. The patient expressed understanding and agreed to proceed.    History of Present Illness: [name] is a 68 y.o. who identifies as a female who was assigned female at birth, and is being seen today for flu like illness  Onset was 2 days ago with headache (this comes and goes) Associated symptoms are developed aches, and diarrhea and vomiting. Modifying factors are Tylenol   Denies chest pain, shortness of breath, fevers, chills  Exposure to sick contacts- granddaughter was sick and she was watching her 2 days prior to onset.  COVID test: no Vaccines: none     Problems:  Patient Active Problem List   Diagnosis Date Noted   Poor sleep pattern 05/25/2024   Stage 3a chronic kidney disease (HCC) 04/27/2024   Symptomatic anemia 10/08/2023   Postviral fatigue syndrome 10/22/2022   Prediabetes 11/18/2021   Moderate episode of recurrent major depressive disorder (HCC) 10/01/2021   Hx of adenomatous colonic polyps 01/02/2021   Pulmonary arterial hypertension (HCC) 07/04/2020   Kidney cysts 07/04/2020   Hyperlipidemia 07/04/2020   Depression 07/04/2020   Iron  deficiency anemia 10/13/2018   DOE (dyspnea on exertion) 03/23/2018   Obesity, morbid, BMI 40.0-49.9 (HCC) 03/23/2018   Chronic diastolic heart failure (HCC) 03/23/2018   Grade I diastolic dysfunction 07/20/2017   Migraine without aura and without status migrainosus, not intractable  03/02/2017   Class 2 severe obesity due to excess calories with serious comorbidity and body mass index (BMI) of 36.0 to 36.9 in adult 01/13/2017   Hiatal hernia 10/28/2016   Pure hypercholesterolemia 10/28/2016   Chronic kidney disease 06/01/2015   Gastroesophageal reflux disease 06/01/2015   Goiter 06/01/2015   Essential hypertension 06/01/2015    Chronic pain syndrome 05/28/2015   Fibromyalgia 05/28/2015   OSA on CPAP 05/28/2015   Adjustment disorder with mixed anxiety and depressed mood 05/28/2015    Allergies:  Allergies  Allergen Reactions   Sulfa Antibiotics Itching   Medications:  Current Outpatient Medications:    albuterol  (PROAIR  HFA) 108 (90 Base) MCG/ACT inhaler, Inhale 1-2 puffs into the lungs every 4 (four) hours as needed for wheezing or shortness of breath., Disp: 18 g, Rfl: 0   atorvastatin  (LIPITOR) 20 MG tablet, Take 1 tablet (20 mg total) by mouth daily., Disp: 90 tablet, Rfl: 3   benzonatate  (TESSALON ) 100 MG capsule, Take 1 capsule (100 mg total) by mouth 3 (three) times daily as needed for cough., Disp: 30 capsule, Rfl: 0   busPIRone  (BUSPAR ) 7.5 MG tablet, Take 1 tablet (7.5 mg total) by mouth 2 (two) times daily., Disp: 180 tablet, Rfl: 1   butalbital -acetaminophen -caffeine  (FIORICET ) 50-325-40 MG tablet, Take 1 tablet by mouth every 6 (six) hours as needed for headache., Disp: 20 tablet, Rfl: 0   cyclobenzaprine  (FLEXERIL ) 10 MG tablet, Take 1 tablet (10 mg total) by mouth 3 (three) times daily as needed for muscle spasms., Disp: 180 tablet, Rfl: 1   DULoxetine  (CYMBALTA ) 60 MG capsule, Take 1 capsule (60 mg total) by mouth daily., Disp: 90 capsule, Rfl: 1   empagliflozin  (JARDIANCE ) 10 MG TABS tablet, Take 1 tablet (10 mg total) by mouth daily before breakfast., Disp: 90 tablet, Rfl: 3   ferrous gluconate (FERGON) 324 MG tablet, Take 1 tablet (324 mg total) by mouth daily with breakfast., Disp: 30 tablet, Rfl: 2   fluticasone  (FLONASE ) 50 MCG/ACT nasal spray, Place 1 spray into both nostrils daily., Disp: 16 g, Rfl: 6   furosemide  (LASIX ) 20 MG tablet, Take 1 tablet (20 mg total) by mouth daily., Disp: 90 tablet, Rfl: 1   gabapentin  (NEURONTIN ) 300 MG capsule, Take 1-2 capsules (300-600 mg total) by mouth 4 (four) times daily., Disp: 540 capsule, Rfl: 1   linaclotide  (LINZESS ) 72 MCG capsule, Take 1 capsule  (72 mcg total) by mouth daily before breakfast., Disp: 30 capsule, Rfl: 2   lisinopril  (ZESTRIL ) 20 MG tablet, Take 1 tablet (20 mg total) by mouth daily., Disp: 90 tablet, Rfl: 1   metoprolol  succinate (TOPROL  XL) 25 MG 24 hr tablet, Take 1 tablet (25 mg total) by mouth daily., Disp: 90 tablet, Rfl: 2   ondansetron  (ZOFRAN -ODT) 4 MG disintegrating tablet, Dissolve 1 tablet (4 mg total) in mouth every 8 (eight) hours as needed for nausea or vomiting., Disp: 20 tablet, Rfl: 4   pantoprazole  (PROTONIX ) 40 MG tablet, Take 1 tablet (40 mg total) by mouth 2 (two) times daily for 10 weeks, THEN 1 tablet (40 mg total) daily., Disp: 180 tablet, Rfl: 3   potassium chloride  SA (KLOR-CON  M) 20 MEQ tablet, Take 1 tablet (20 mEq total) by mouth daily., Disp: 90 tablet, Rfl: 2   topiramate  (TOPAMAX ) 50 MG tablet, Take 1 tablet (50 mg total) by mouth 2 (two) times daily., Disp: 180 tablet, Rfl: 1   vortioxetine  HBr (TRINTELLIX ) 10 MG TABS tablet, Take 1 tablet (10 mg total) by  mouth daily., Disp: 90 tablet, Rfl: 1   zolpidem  (AMBIEN ) 5 MG tablet, Take 1 tablet (5 mg total) by mouth at bedtime as needed for sleep., Disp: 15 tablet, Rfl: 1  Observations/Objective: Patient is well-developed, well-nourished in no acute distress.  Resting comfortably  at home.  Head is normocephalic, atraumatic.  No labored breathing.  Speech is clear and coherent with logical content.  Patient is alert and oriented at baseline.    Assessment and Plan:  1. Body aches (Primary)   2. Flu-like symptoms  -without fever, resp sx, or URI sx-and known flu exposure- will refrain from Tamiflu  use- as she is right at the cut off for it helping -hydration -rest -tylenol  and or motrin as needed for aches -follow up if symptoms worsen or fail to improve  Reviewed side effects, risks and benefits of medication.    Patient acknowledged agreement and understanding of the plan.   Past Medical, Surgical, Social History, Allergies, and  Medications have been Reviewed.    Follow Up Instructions: I discussed the assessment and treatment plan with the patient. The patient was provided an opportunity to ask questions and all were answered. The patient agreed with the plan and demonstrated an understanding of the instructions.  A copy of instructions were sent to the patient via MyChart unless otherwise noted below.    The patient was advised to call back or seek an in-person evaluation if the symptoms worsen or if the condition fails to improve as anticipated.    Chiquita CHRISTELLA Barefoot, NP

## 2024-05-31 NOTE — Patient Instructions (Addendum)
 [name], thank you for joining Melissa CHRISTELLA Barefoot, NP for today's virtual visit.  While this provider is not your primary care provider (PCP), if your PCP is located in our provider database this encounter information will be shared with them immediately following your visit.   [name] gives you access to today's visit and all your visits, tests, and labs performed at [facility]  click here if you don't have a [name] MyChart account or go to [email or web address]  Consent: (Patient) [name] provided verbal consent for this virtual visit at the beginning of the encounter.  Current Medications:  Current Outpatient Medications:    albuterol  (PROAIR  HFA) 108 (90 Base) MCG/ACT inhaler, Inhale 1-2 puffs into the lungs every 4 (four) hours as needed for wheezing or shortness of breath., Disp: 18 g, Rfl: 0   atorvastatin  (LIPITOR) 20 MG tablet, Take 1 tablet (20 mg total) by mouth daily., Disp: 90 tablet, Rfl: 3   benzonatate  (TESSALON ) 100 MG capsule, Take 1 capsule (100 mg total) by mouth 3 (three) times daily as needed for cough., Disp: 30 capsule, Rfl: 0   busPIRone  (BUSPAR ) 7.5 MG tablet, Take 1 tablet (7.5 mg total) by mouth 2 (two) times daily., Disp: 180 tablet, Rfl: 1   butalbital -acetaminophen -caffeine  (FIORICET ) 50-325-40 MG tablet, Take 1 tablet by mouth every 6 (six) hours as needed for headache., Disp: 20 tablet, Rfl: 0   cyclobenzaprine  (FLEXERIL ) 10 MG tablet, Take 1 tablet (10 mg total) by mouth 3 (three) times daily as needed for muscle spasms., Disp: 180 tablet, Rfl: 1   DULoxetine  (CYMBALTA ) 60 MG capsule, Take 1 capsule (60 mg total) by mouth daily., Disp: 90 capsule, Rfl: 1   empagliflozin  (JARDIANCE ) 10 MG TABS tablet, Take 1 tablet (10 mg total) by mouth daily before breakfast., Disp: 90 tablet, Rfl: 3   ferrous gluconate (FERGON) 324 MG tablet, Take 1 tablet (324 mg total) by mouth daily with breakfast., Disp: 30 tablet,  Rfl: 2   fluticasone  (FLONASE ) 50 MCG/ACT nasal spray, Place 1 spray into both nostrils daily., Disp: 16 g, Rfl: 6   furosemide  (LASIX ) 20 MG tablet, Take 1 tablet (20 mg total) by mouth daily., Disp: 90 tablet, Rfl: 1   gabapentin  (NEURONTIN ) 300 MG capsule, Take 1-2 capsules (300-600 mg total) by mouth 4 (four) times daily., Disp: 540 capsule, Rfl: 1   linaclotide  (LINZESS ) 72 MCG capsule, Take 1 capsule (72 mcg total) by mouth daily before breakfast., Disp: 30 capsule, Rfl: 2   lisinopril  (ZESTRIL ) 20 MG tablet, Take 1 tablet (20 mg total) by mouth daily., Disp: 90 tablet, Rfl: 1   metoprolol  succinate (TOPROL  XL) 25 MG 24 hr tablet, Take 1 tablet (25 mg total) by mouth daily., Disp: 90 tablet, Rfl: 2   ondansetron  (ZOFRAN -ODT) 4 MG disintegrating tablet, Dissolve 1 tablet (4 mg total) in mouth every 8 (eight) hours as needed for nausea or vomiting., Disp: 20 tablet, Rfl: 4   pantoprazole  (PROTONIX ) 40 MG tablet, Take 1 tablet (40 mg total) by mouth 2 (two) times daily for 10 weeks, THEN 1 tablet (40 mg total) daily., Disp: 180 tablet, Rfl: 3   potassium chloride  SA (KLOR-CON  M) 20 MEQ tablet, Take 1 tablet (20 mEq total) by mouth daily., Disp: 90 tablet, Rfl: 2   topiramate  (TOPAMAX ) 50 MG tablet, Take 1 tablet (50 mg total) by mouth 2 (two) times daily., Disp: 180 tablet, Rfl: 1   vortioxetine  HBr (TRINTELLIX ) 10 MG TABS tablet,  Take 1 tablet (10 mg total) by mouth daily., Disp: 90 tablet, Rfl: 1   zolpidem  (AMBIEN ) 5 MG tablet, Take 1 tablet (5 mg total) by mouth at bedtime as needed for sleep., Disp: 15 tablet, Rfl: 1   Medications ordered in this encounter:  No orders of the defined types were placed in this encounter.    *If you need refills on other medications prior to your next appointment, please contact your pharmacy*  Follow-Up: Call back or seek an in-person evaluation if the symptoms worsen or if the condition fails to improve as anticipated.  [facility] [phone]  Other Instructions  -hydration -rest -tylenol  and or motrin as needed for aches -follow up if symptoms worsen or fail to improve   If you have been instructed to have an in-person evaluation today at a local Urgent Care facility, please use the link below. It will take you to a list of all of our available [facility], including address, phone number and hours of operation. Please do not delay care.  [facility]  If you or a family member do not have a primary care provider, use the link below to schedule a visit and establish care. When you choose a [facility] primary care physician or advanced practice provider, you gain a long-term partner in health. Find a Primary Care Provider  Learn more about [facility]'s in-office and virtual care options: [facility] - Get Care Now

## 2024-06-06 ENCOUNTER — Ambulatory Visit: Attending: Cardiology | Admitting: Cardiology

## 2024-06-06 ENCOUNTER — Encounter: Payer: Self-pay | Admitting: Cardiology

## 2024-06-06 VITALS — BP 138/88 | HR 72 | Ht 64.0 in | Wt 210.0 lb

## 2024-06-06 DIAGNOSIS — I5032 Chronic diastolic (congestive) heart failure: Secondary | ICD-10-CM

## 2024-06-06 DIAGNOSIS — D508 Other iron deficiency anemias: Secondary | ICD-10-CM | POA: Diagnosis not present

## 2024-06-06 DIAGNOSIS — E782 Mixed hyperlipidemia: Secondary | ICD-10-CM

## 2024-06-06 DIAGNOSIS — G4733 Obstructive sleep apnea (adult) (pediatric): Secondary | ICD-10-CM | POA: Diagnosis not present

## 2024-06-06 DIAGNOSIS — I2721 Secondary pulmonary arterial hypertension: Secondary | ICD-10-CM | POA: Diagnosis not present

## 2024-06-06 DIAGNOSIS — I10 Essential (primary) hypertension: Secondary | ICD-10-CM

## 2024-06-06 DIAGNOSIS — E7800 Pure hypercholesterolemia, unspecified: Secondary | ICD-10-CM

## 2024-06-06 DIAGNOSIS — R0609 Other forms of dyspnea: Secondary | ICD-10-CM | POA: Diagnosis not present

## 2024-06-06 DIAGNOSIS — R0789 Other chest pain: Secondary | ICD-10-CM

## 2024-06-06 DIAGNOSIS — R931 Abnormal findings on diagnostic imaging of heart and coronary circulation: Secondary | ICD-10-CM | POA: Diagnosis not present

## 2024-06-06 NOTE — Assessment & Plan Note (Signed)
 Intermittent chest pain associated with certain movements, likely musculoskeletal in origin due to arthritis at the costosternal junction. - Reassure regarding musculoskeletal origin of chest pain.  Sounds like costochondritis.  Discussed using Tylenol  or heat.  Trying to avoid NSAIDs due to recent GI bleed.

## 2024-06-06 NOTE — Assessment & Plan Note (Signed)
 Iron  deficiency anemia secondary to a previously diagnosed ulcer. Hemoglobin has improved to 12.3 g/dL from 9.0 g/dL eight months ago with iron  supplementation. Ferritin and iron  levels remain low, indicating ongoing need for supplementation. - Continue iron  supplementation to improve iron  stores.

## 2024-06-06 NOTE — Assessment & Plan Note (Signed)
 Chronic diastolic heart failure with grade one diastolic dysfunction. Echocardiogram shows normal pump function with an ejection fraction of 60-65%, no wall motion abnormalities, and moderately dilated left atrium. Right atrial pressure is normal. Symptoms include shortness of breath with exertion, likely multifactorial, possibly related to deconditioning, cardiac, or pulmonary causes. She prefers to attempt increasing physical activity first. - Encourage increased physical activity to improve conditioning. - Continue combination of lisinopril  20 mg daily and Toprol -XL 25 mg daily - Continue current dose of furosemide  20 mg daily with additional dose as needed for weight gain - Continue Jardiance  10 mg daily - Consider cardiopulmonary exercise test if symptoms do not improve with increased activity.

## 2024-06-06 NOTE — Assessment & Plan Note (Signed)
 Stable.  -Continue CPAP

## 2024-06-06 NOTE — Assessment & Plan Note (Signed)
 Not sure what to treat etiology of her dyspnea is I think it is probably just really more related to deconditioning.  The 1 remaining definitive evaluation we could look into would be a CPX to delineate cardiac versus pulmonary versus deconditioning etiology. She thinks that she is out of shape and would like to give a trial of increasing exercise level before urodynamic test.  Will hold off for now.  If no improvement, will order CPX

## 2024-06-06 NOTE — Patient Instructions (Signed)
 Medication Instructions:  No changes   *If you need a refill on your cardiac medications before your next appointment, please call your pharmacy*   Lab Work: Not needed    Testing/Procedures:  Not needed  Follow-Up: At [facility], you and your health needs are our priority.  As part of our continuing mission to provide you with exceptional heart care, we have created designated Provider Care Teams.  These Care Teams include your primary Cardiologist (physician) and Advanced Practice Providers (APPs -  Physician Assistants and Nurse Practitioners) who all work together to provide you with the care you need, when you need it.     Your next appointment:   6 month(s)  The format for your next appointment:   In Person  Provider:   Alm Clay, MD or Damien Braver, NP or Katlyn West, NP         Other Instructions   Your physician discussed the importance of regular exercise and recommended that you start or continue a regular exercise program for good health.

## 2024-06-06 NOTE — Assessment & Plan Note (Signed)
 Blood pressure today is 138/88 mmHg, which is borderline high. Recent stressors, including family issues, may have contributed to elevated readings. Blood pressure control is important to prevent worsening diastolic dysfunction. - Monitor blood pressure at home, aiming for readings in the 120s to low 130s systolic and less than 85 diastolic. - Reassess blood pressure control in six months.  - Continue current dose of lisinopril  20 mg daily and Toprol -XL 25 mg daily. - She is on standing dose of furosemide  20 mg daily for diuresis. - She does have renal to titrate beta-blocker little bit, although there is concern of possible chronotropic incompetence.  If additional blood pressure control is required, would potentially consider amlodipine.

## 2024-06-06 NOTE — Assessment & Plan Note (Signed)
 LDL 71 on 20 mg atorvastatin .  Well-controlled.  Continue current meds

## 2024-06-06 NOTE — Progress Notes (Signed)
 Cardiology Office Note:  .   Date:  06/07/2024  ID:  KHLOEY CHERN, DOB [birth year], MRN [id number] PCP: Purcell Emil Schanz, MD  [facility] Providers Cardiologist:  Alm Clay, MD     Chief Complaint  Patient presents with   Follow-up    Exertional dyspnea.    Patient Profile: .     KYANN HEYDT is a moderately obese 68 y.o. female with a PMH notable for HTN, questionable HFpEF (DOE), OSA on CPAP and chronic anemia 20-month follow-up to discuss test results.   Coronary Calcium  Score 06/07/2019: CAC score of 10 with proximal calcification in the RCA noted. ?  Mild dilation of the PA, and RV/RA Echo 10/05/2018: EF 60 to 55%.  Normal diastolic parameters?  With mild LA and RA dilation.  Normal RV size and function.  Normal PAP.      JAQUELINNE GLENDENING was last seen on 11/03/2023 as a 3-week follow-up after being seen in medicine, NP with complaints of dyspnea, edema, PND and orthopnea-ER follow-up.  Symptoms resolved with IV diuretic and she was discharged on Lasix  plus potassium supplement propranolol  was converted to Toprol  25 mg daily.  She was noting exertional dyspnea as well as some palpitations and trivial edema with occasional pinprick like chest discomfort.  But no orthopnea or PND.  Trivial edema. She had an echo done we discussed ordering CPX to evaluate for etiology of her dyspnea.  There was also discussion of potential endoscopy for evaluation of anemia.  We also discussed possibility of GLP-1 agonist.  Subjective  Discussed the use of AI scribe software for clinical note transcription with the patient, who gave verbal consent to proceed.  History of Present Illness HAIDYN KILBURG is an obese 68 year old female with trivial CAD, questionable HFpEF as well as HTN and OSA who presents with shortness of breath and chest pain.  She experiences shortness of breath, particularly when walking short distances, such as from the waiting area to the examination room. This  has been a persistent issue without significant worsening. No orthopnea or paroxysmal nocturnal dyspnea. She uses a CPAP machine for obstructive sleep apnea, which is outdated since 2012 and due for retesting and possible replacement.  She experiences occasional chest pain, which occurs with certain movements such as turning her head or reaching. This pain is not associated with walking or exertion. A coronary CTA from November 2020 showed minimal plaque buildup and a coronary calcification score of 10.  She reports occasional heart flutters lasting a few seconds but denies dizziness, syncope, or significant leg swelling. She sleeps on one pillow and does not wake up due to breathing difficulties, only to use the bathroom.  Her past medical history includes an ulcer diagnosed via endoscopy, which led to anemia. She is currently on iron  supplements to address low iron  levels and improve her hemoglobin, which was 12.3 as of October 16th, up from 9.0 eight months prior.  She takes several medications including atorvastatin  20 mg, furosemide  20 mg, Jardiance  10 mg, lisinopril  20 mg, metoprolol  succinate 25 mg, and a potassium supplement 20 mcg daily. No significant weight gain recently, although she lost some weight due to a virus contracted from her granddaughter.  She experiences restless legs, particularly in her left leg, which bothers her and starts in her hip. This condition affects her sleep and she plans to have it checked as it starts in her hip.  Her blood pressure has been elevated recently, which she attributes  to stress from personal events, including the illness and loss of a pet and a family incident involving her granddaughter.  Cardiovascular ROS: positive for - dyspnea on exertion, edema, palpitations, and intermittent episodes of musculoskeletal chest pain worse with certain movements but not with walking or exertion.  Occasional fluttering sensations that are short-lived. negative for  - irregular heartbeat, orthopnea, paroxysmal nocturnal dyspnea, shortness of breath, or syncope or near syncope, TIA or emesis fugax, claudication  ROS:  Review of Systems - as noted above    Objective   Current CV meds: Atorvastatin  20 [place] daily, Jardiance  10 mg daily, furosemide  20 mg p.o. (along with Klor-Con  20 mg daily), lisinopril  20 [place] daily and Toprol -XL 25 mg daily; prescription eight 324 mg daily  Studies Reviewed: SABRA   EKG Interpretation Date/Time:  Monday June 06 2024 10:31:46 EST Ventricular Rate:  72 PR Interval:  160 QRS Duration:  90 QT Interval:  380 QTC Calculation: 416 R Axis:   -8  Text Interpretation: Normal sinus rhythm Normal ECG When compared with ECG of 03-Nov-2023 13:57, No significant change was found Confirmed by Anner Lenis ([id number]) on 06/06/2024 10:47:02 AM    Results LABS A1c: 6.1 (March 2025) Cholesterol: 148 (10/26/2023) Triglycerides: 76 (10/26/2023) HDL: 62 (10/26/2023) LDL: 71 (10/26/2023) Potassium: 3.4 (October 2025) BUN: 11 (October 2025) Creatinine: 0.95 (October 2025) Calcium : Low (October 2025) Ferritin: 13 (October 2025) Iron : 33 (October 2025) Hemoglobin: 12.3 (05/19/2024) Hemoglobin: 9.0 (February 2025)  DIAGNOSTIC EKG: Normal (06/06/2024) Echocardiogram: Normal systolic function, ejection fraction 60-65%, no wall motion abnormalities, impaired diastolic relaxation, normal right atrial pressure, moderately dilated left atrium, normal aortic and other valves (March 2025) Endoscopy: normal esophagus.  Dilated.  Biopsied.  Large hiatal hernia.  Gastritis biopsied.  Nonbleeding duodenal diverticulum.  Recommended pantoprazole  40 mg twice daily for 10 weeks then reduce to once daily.  Avoid NSAIDs.  Lcer (02/25/2024) Colonoscopy: Five 3 to 12 mm polyps in the transverse colon and ascending colon removed with cold snare.  Sigmoid colon and descending colon diverticulosis.  Nonbleeding internal hemorrhoids.  (02/25/2024)  Risk  Assessment/Calculations:         Physical Exam:   VS:  BP 138/88 (BP Location: Right Arm, Patient Position: Sitting, Cuff Size: Normal)   Pulse 72   Ht 5' 4 (1.626 m)   Wt 210 lb (95.3 kg)   SpO2 93%   BMI 36.05 kg/m    Wt Readings from Last 3 Encounters:  06/06/24 210 lb (95.3 kg)  05/25/24 217 lb (98.4 kg)  05/19/24 215 lb 6.4 oz (97.7 kg)      GEN: Well nourished, well groomed in no acute distress; moderately obese NECK: No JVD; No carotid bruits CARDIAC: Normal S1, S2; RRR, Soft 1/6 SEM otw NO murmurs, rubs, gallops RESPIRATORY:  Clear to auscultation without rales, wheezing or rhonchi ; nonlabored, good air movement. ABDOMEN: Soft, non-tender, non-distended EXTREMITIES:  No edema; No deformity     ASSESSMENT AND PLAN: .    Problem List Items Addressed This Visit       Cardiology Problems   Chronic diastolic heart failure (HCC) (Chronic)   Chronic diastolic heart failure with grade one diastolic dysfunction. Echocardiogram shows normal pump function with an ejection fraction of 60-65%, no wall motion abnormalities, and moderately dilated left atrium. Right atrial pressure is normal. Symptoms include shortness of breath with exertion, likely multifactorial, possibly related to deconditioning, cardiac, or pulmonary causes. She prefers to attempt increasing physical activity first. - Encourage increased physical activity to  improve conditioning. - Continue combination of lisinopril  20 mg daily and Toprol -XL 25 mg daily - Continue current dose of furosemide  20 mg daily with additional dose as needed for weight gain - Continue Jardiance  10 mg daily - Consider cardiopulmonary exercise test if symptoms do not improve with increased activity.      Relevant Orders   EKG 12-Lead (Completed)   Essential hypertension (Chronic)   Blood pressure today is 138/88 mmHg, which is borderline high. Recent stressors, including family issues, may have contributed to elevated readings. Blood  pressure control is important to prevent worsening diastolic dysfunction. - Monitor blood pressure at home, aiming for readings in the 120s to low 130s systolic and less than 85 diastolic. - Reassess blood pressure control in six months.  - Continue current dose of lisinopril  20 mg daily and Toprol -XL 25 mg daily. - She is on standing dose of furosemide  20 mg daily for diuresis. - She does have renal to titrate beta-blocker little bit, although there is concern of possible chronotropic incompetence.  If additional blood pressure control is required, would potentially consider amlodipine.      Relevant Orders   EKG 12-Lead (Completed)   Hyperlipidemia (Chronic)   LDL 71 on 20 mg atorvastatin .  Well-controlled.  Continue current meds      Relevant Orders   EKG 12-Lead (Completed)   Pulmonary arterial hypertension (HCC)   Suggested on CT scans but not confirmed on echo either in 2020 or 2025.      Pure hypercholesterolemia (Chronic)   Relevant Orders   EKG 12-Lead (Completed)     Other   Agatston CAC score, <100 (Chronic)   Coronary artery disease with minimal plaque burden as per previous coronary CTA. No current angina. 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 - Continue current management as no new cardiac symptoms are present.  - Continue Toprol -XL 25 mg daily along with lisinopril  20 midodrine daily, Jardiance  and furosemide  along with atorvastatin  20 mg daily.  At this minimal level of plaque, would hold off on aspirin.      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 - Reassure regarding musculoskeletal origin of chest pain.  Sounds like costochondritis.  Discussed using Tylenol  or heat.  Trying to avoid NSAIDs due to recent GI bleed.      DOE (dyspnea on exertion) - Primary (Chronic)   Not sure  what to treat etiology of her dyspnea is I think it is probably just really more related to deconditioning.  The 1 remaining definitive evaluation we could look into would be a CPX to delineate cardiac versus pulmonary versus deconditioning etiology. She thinks that she is out of shape and would like to give a trial of increasing exercise level before urodynamic test.  Will hold off for now.  If no improvement, will order CPX      Iron  deficiency anemia (Chronic)   Iron  deficiency anemia secondary to a previously diagnosed ulcer. Hemoglobin has improved to 12.3 g/dL from 9.0 g/dL eight months ago with iron  supplementation. Ferritin and iron  levels remain low, indicating ongoing need for supplementation. - Continue iron  supplementation to improve iron  stores.      OSA on CPAP (Chronic)   Stable.  Continue CPAP.           Follow-Up: Return in about 6 months (around 12/04/2024) for 6 month follow-up  with me, or with APP.     Signed, Alm MICAEL Clay, MD, MS Alm Clay, M.D., M.S. Interventional Cardiologist  [facility] Pager # [phone]

## 2024-06-06 NOTE — Assessment & Plan Note (Signed)
 Coronary artery disease with minimal plaque burden as per previous coronary CTA. No current angina. 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 - Continue current management as no new cardiac symptoms are present.  - Continue Toprol -XL 25 mg daily along with lisinopril  20 midodrine daily, Jardiance  and furosemide  along with atorvastatin  20 mg daily.  At this minimal level of plaque, would hold off on aspirin.

## 2024-06-06 NOTE — Assessment & Plan Note (Signed)
 Suggested on CT scans but not confirmed on echo either in 2020 or 2025.

## 2024-06-08 ENCOUNTER — Ambulatory Visit

## 2024-06-08 ENCOUNTER — Ambulatory Visit: Admitting: Neurology

## 2024-06-08 ENCOUNTER — Ambulatory Visit (INDEPENDENT_AMBULATORY_CARE_PROVIDER_SITE_OTHER)

## 2024-06-08 VITALS — BP 130/70 | HR 72 | Ht 65.0 in | Wt 212.0 lb

## 2024-06-08 DIAGNOSIS — G478 Other sleep disorders: Secondary | ICD-10-CM

## 2024-06-08 DIAGNOSIS — Z1231 Encounter for screening mammogram for malignant neoplasm of breast: Secondary | ICD-10-CM

## 2024-06-08 DIAGNOSIS — Z0000 Encounter for general adult medical examination without abnormal findings: Secondary | ICD-10-CM

## 2024-06-08 DIAGNOSIS — G4733 Obstructive sleep apnea (adult) (pediatric): Secondary | ICD-10-CM

## 2024-06-08 DIAGNOSIS — K635 Polyp of colon: Secondary | ICD-10-CM

## 2024-06-08 NOTE — Patient Instructions (Addendum)
 [name],  Thank you for taking the time for your Medicare Wellness Visit. I appreciate your continued commitment to your health goals. Please review the care plan we discussed, and feel free to reach out if I can assist you further.  Please note that Annual Wellness Visits do not include a physical exam. Some assessments may be limited, especially if the visit was conducted virtually. If needed, we may recommend an in-person follow-up with your provider.  Ongoing Care Seeing your primary care provider every 3 to 6 months helps us  monitor your health and provide consistent, personalized care.   Referrals If a referral was made during today's visit and you haven't received any updates within two weeks, please contact the referred provider directly to check on the status.  Recommended Screenings:  Health Maintenance  Topic Date Due   COVID-19 Vaccine (7 - 2025-26 season) 11/22/2024   Breast Cancer Screening  04/09/2025   Medicare Annual Wellness Visit  06/08/2025   DTaP/Tdap/Td vaccine (2 - Td or Tdap) 06/24/2025   Colon Cancer Screening  02/25/2027   Pneumococcal Vaccine for age over 50  Completed   Flu Shot  Completed   DEXA scan (bone density measurement)  Completed   Hepatitis C Screening  Completed   Zoster (Shingles) Vaccine  Completed   Meningitis B Vaccine  Aged Out       06/08/2024    2:00 PM  Advanced Directives  Does Patient Have a Medical Advance Directive? No  Would patient like information on creating a medical advance directive? No - Patient declined    Vision: Annual vision screenings are recommended for early detection of glaucoma, cataracts, and diabetic retinopathy. These exams can also reveal signs of chronic conditions such as diabetes and high blood pressure.  Dental: Annual dental screenings help detect early signs of oral cancer, gum disease, and other conditions linked to overall health, including heart disease and diabetes.  P

## 2024-06-08 NOTE — Progress Notes (Signed)
 Subjective:   [name] is a 68 y.o. female who presents for a Medicare Annual Wellness Visit.  Allergies (verified) Sulfa antibiotics   History: Past Medical History:  Diagnosis Date   Allergy    [name], Flonase    Anemia    Anxiety    Arthritis    Chronic kidney disease    Colon polyps    Depression    Fibromyalgia    Gastritis    GERD (gastroesophageal reflux disease)    Hernia, hiatal    High risk for colon cancer    HLD (hyperlipidemia)    Hypertension    Migraine    Neuropathy    Osteoporosis    Pneumonia    Sleep apnea    c-pap nightly   Sleep apnea with use of continuous positive airway pressure (CPAP)    Thyroid  goiter    Past Surgical History:  Procedure Laterality Date   48-Hour Holter Monitor  06/2017   Mostly normal sinus rhythm.  Average heart rate 71 bpm.  Minimum 54 bpm.  Maximum sinus rate 114 bpm.  Rare PACs and PVCs.  No A. fib, atrial flutter, SVT or VT.  One brief run of 8 beat PAT.   ABDOMINAL HYSTERECTOMY     ovaries intact; DUB.  No dysplasia.   FRACTURE SURGERY N/A    [employer] 09/25/2020   JOINT REPLACEMENT     OPEN REDUCTION INTERNAL FIXATION (ORIF) DISTAL RADIAL FRACTURE Right 07/09/2017   Procedure: RIGHT WRIST OPEN REDUCTION INTERNAL FIXATION (ORIF) DISTAL RADIAL FRACTURE AND REPAIR AS INDICATED;  Surgeon: Shari Easter, MD;  Location: [facility] OR;  Service: Orthopedics;  Laterality: Right;   PARTIAL HYSTERECTOMY     ROTATOR CUFF REPAIR Right 2009   SHOULDER SURGERY Right    shoulder dislocation, fall, and elbow unla nerve   SHOULDER SURGERY Right 2015   labral tear   TRANSTHORACIC ECHOCARDIOGRAM  06/2017    EF 55-60%.  grade 1 diastolic dysfunction.  Otherwise normal   TUBAL LIGATION     ULNAR NERVE REPAIR Right 08/19/2014   Family History  Problem Relation Age of Onset   Heart disease Mother 30       AMI/CAD/CHF as cause of death   Hypertension Mother    Hyperlipidemia Mother    Hypothyroidism Mother    Depression Mother     Gout Mother    Heart disease Father 23       AMI   Hypertension Father    Stroke Father 26       mild CVA   Prostate cancer Father    Hyperlipidemia Father    Cancer Father 20       prostate cancer   Cancer Sister        Basal cell carcinoma scalp   Depression Sister    Arthritis Sister    Depression Sister    Cancer Maternal Grandmother        type unknown   Cancer Maternal Grandfather        type unknown   Heart disease Paternal Grandmother    Heart defect Paternal Grandfather    Hypertension Brother    Hyperlipidemia Brother    Cancer Brother        prostate cancer   Melanoma Brother    Hyperlipidemia Brother    Hypertension Brother    Cancer Brother        melanoma   Hypertension Brother    Hyperlipidemia Brother    Melanoma Brother  Cancer Brother 25       melanoma   Hypertension Brother    Depression Brother    Hypertension Brother    Gout Brother    Colon cancer Neg Hx    Esophageal cancer Neg Hx    Stomach cancer Neg Hx    Rectal cancer Neg Hx    Breast cancer Neg Hx    Social History   Occupational History   Occupation: unemployed  Tobacco Use   Smoking status: Former    Current packs/day: 0.00    Types: Cigarettes    Quit date: 08/04/1998    Years since quitting: 25.8   Smokeless tobacco: Never  Vaping Use   Vaping status: Never Used  Substance and Sexual Activity   Alcohol use: No   Drug use: No   Sexual activity: Yes    Birth control/protection: Surgical, Post-menopausal   Tobacco Counseling Counseling given: Not Answered  SDOH Screenings   Food Insecurity: No Food Insecurity (06/08/2024)  Housing: Unknown (06/08/2024)  Transportation Needs: No Transportation Needs (06/08/2024)  Utilities: Not At Risk (06/08/2024)  Alcohol Screen: Low Risk  (12/06/2019)  Depression (PHQ2-9): Low Risk  (06/08/2024)  Recent Concern: Depression (PHQ2-9) - Medium Risk (04/27/2024)  Financial Resource Strain: Low Risk  (06/01/2024)  Physical Activity:  Inactive (06/08/2024)  Social Connections: Socially Integrated (06/08/2024)  Stress: No Stress Concern Present (06/08/2024)  Recent Concern: Stress - Stress Concern Present (06/01/2024)  Tobacco Use: Medium Risk (06/08/2024)  Health Literacy: Adequate Health Literacy (06/08/2024)   Depression Screen    06/08/2024    2:07 PM 04/27/2024    1:13 PM 10/26/2023    1:02 PM 10/08/2023    1:20 PM 03/31/2023   11:17 AM 10/22/2022    2:05 PM 03/26/2022   11:05 AM  PHQ 2/9 Scores  PHQ - 2 Score 0 2 3 3  0 4 1  PHQ- 9 Score 0 6 7 7  0 10      Goals Addressed               This Visit's Progress     Patient Stated (pt-stated)        Patient stated she plans to start walking       Visit info / Clinical Intake: Medicare Wellness Visit Type:: Subsequent Annual Wellness Visit Medicare Wellness Visit Mode:: In-person (required for WTM) Interpreter Needed?: No Pre-visit prep was completed: yes AWV questionnaire completed by patient prior to visit?: no Living arrangements:: lives with spouse/significant other Patient's Overall Health Status Rating: good Typical amount of pain: none Does pain affect daily life?: no Are you currently prescribed opioids?: no  Dietary Habits and Nutritional Risks How many meals a day?: (!) 1 Eats fruit and vegetables daily?: yes Most meals are obtained by: preparing own meals In the last 2 weeks, have you had any of the following?: -- (none) Diabetic:: no  Functional Status Activities of Daily Living (to include ambulation/medication): Independent Ambulation: Independent with device- listed below Home Assistive Devices/Equipment: CPAP; Eyeglasses Medication Administration: Independent Home Management: Independent Manage your own finances?: yes Primary transportation is: driving Concerns about vision?: no *vision screening is required for WTM* Concerns about hearing?: (!) yes Uses hearing aids?: (!) yes Hear whispered voice?: yes  Fall Screening Falls in  the past year?: 1 Number of falls in past year: 1 Was there an injury with Fall?: 1 Fall Risk Category Calculator: 3 Patient Fall Risk Level: High Fall Risk  Fall Risk Patient at Risk for Falls Due to: Impaired  balance/gait Fall risk Follow up: Falls evaluation completed; Falls prevention discussed  Home and Transportation Safety: All rugs have non-skid backing?: N/A, no rugs All stairs or steps have railings?: N/A, no stairs Grab bars in the bathtub or shower?: (!) no Have non-skid surface in bathtub or shower?: yes Good home lighting?: yes Regular seat belt use?: yes Hospital stays in the last year:: no  Cognitive Assessment Difficulty concentrating, remembering, or making decisions? : no Will 6CIT or Mini Cog be Completed: yes What year is it?: 0 points What month is it?: 0 points Give patient an address phrase to remember (5 components): [address] [place], [place] About what time is it?: 0 points Count backwards from 20 to 1: 0 points Say the months of the year in reverse: 0 points Repeat the address phrase from earlier: 0 points 6 CIT Score: 0 points  Advance Directives (For Healthcare) Does Patient Have a Medical Advance Directive?: No Would patient like information on creating a medical advance directive?: No - Patient declined  Reviewed/Updated  Reviewed/Updated: All        Objective:    Today's Vitals   06/08/24 1400  BP: 130/70  Pulse: 72  SpO2: 98%  Weight: 212 lb (96.2 kg)  Height: 5' 5 (1.651 m)   Body mass index is 35.28 kg/m.  Current Medications (verified) Outpatient Encounter Medications as of 06/08/2024  Medication Sig   albuterol  (PROAIR  HFA) 108 (90 Base) MCG/ACT inhaler Inhale 1-2 puffs into the lungs every 4 (four) hours as needed for wheezing or shortness of breath.   atorvastatin  (LIPITOR) 20 MG tablet Take 1 tablet (20 mg total) by mouth daily.   benzonatate  (TESSALON ) 100 MG capsule Take 1 capsule (100 mg total) by mouth 3 (three)  times daily as needed for cough.   busPIRone  (BUSPAR ) 7.5 MG tablet Take 1 tablet (7.5 mg total) by mouth 2 (two) times daily.   butalbital -acetaminophen -caffeine  (FIORICET ) 50-325-40 MG tablet Take 1 tablet by mouth every 6 (six) hours as needed for headache.   cyclobenzaprine  (FLEXERIL ) 10 MG tablet Take 1 tablet (10 mg total) by mouth 3 (three) times daily as needed for muscle spasms.   DULoxetine  (CYMBALTA ) 60 MG capsule Take 1 capsule (60 mg total) by mouth daily.   empagliflozin  (JARDIANCE ) 10 MG TABS tablet Take 1 tablet (10 mg total) by mouth daily before breakfast.   ferrous gluconate (FERGON) 324 MG tablet Take 1 tablet (324 mg total) by mouth daily with breakfast.   fluticasone  (FLONASE ) 50 MCG/ACT nasal spray Place 1 spray into both nostrils daily.   furosemide  (LASIX ) 20 MG tablet Take 1 tablet (20 mg total) by mouth daily.   gabapentin  (NEURONTIN ) 300 MG capsule Take 1-2 capsules (300-600 mg total) by mouth 4 (four) times daily.   linaclotide  (LINZESS ) 72 MCG capsule Take 1 capsule (72 mcg total) by mouth daily before breakfast.   lisinopril  (ZESTRIL ) 20 MG tablet Take 1 tablet (20 mg total) by mouth daily.   metoprolol  succinate (TOPROL  XL) 25 MG 24 hr tablet Take 1 tablet (25 mg total) by mouth daily.   ondansetron  (ZOFRAN -ODT) 4 MG disintegrating tablet Dissolve 1 tablet (4 mg total) in mouth every 8 (eight) hours as needed for nausea or vomiting.   pantoprazole  (PROTONIX ) 40 MG tablet Take 1 tablet (40 mg total) by mouth 2 (two) times daily for 10 weeks, THEN 1 tablet (40 mg total) daily.   potassium chloride  SA (KLOR-CON  M) 20 MEQ tablet Take 1 tablet (20 mEq total)  by mouth daily.   topiramate  (TOPAMAX ) 50 MG tablet Take 1 tablet (50 mg total) by mouth 2 (two) times daily.   vortioxetine  HBr (TRINTELLIX ) 10 MG TABS tablet Take 1 tablet (10 mg total) by mouth daily.   zolpidem  (AMBIEN ) 5 MG tablet Take 1 tablet (5 mg total) by mouth at bedtime as needed for sleep.   No  facility-administered encounter medications on file as of 06/08/2024.   Hearing/Vision screen Hearing Screening - Comments:: Denies hearing difficulties   Vision Screening - Comments:: Wears rx glasses - up to date with routine eye exams with [facility] Immunizations and Health Maintenance Health Maintenance  Topic Date Due   COVID-19 Vaccine (7 - 2025-26 season) 11/22/2024   Mammogram  04/09/2025   Medicare Annual Wellness (AWV)  06/08/2025   DTaP/Tdap/Td (2 - Td or Tdap) 06/24/2025   Colonoscopy  02/25/2027   Pneumococcal Vaccine: 50+ Years  Completed   Influenza Vaccine  Completed   DEXA SCAN  Completed   Hepatitis C Screening  Completed   Zoster Vaccines- Shingrix  Completed   Meningococcal B Vaccine  Aged Out        Assessment/Plan:  This is a routine wellness examination for Kortney.  Patient Care Team: Purcell Emil Schanz, MD as PCP - General (Internal Medicine) Anner Alm ORN, MD as PCP - Cardiology (Cardiology) Brenna Adine CROME, DO as Consulting Physician (Pulmonary Disease) Teressa Toribio SQUIBB, MD (Inactive) as Attending Physician (Gastroenterology)  I have personally reviewed and noted the following in the patient's chart:   Medical and social history Use of alcohol, tobacco or illicit drugs  Current medications and supplements including opioid prescriptions. Functional ability and status Nutritional status Physical activity Advanced directives List of other physicians Hospitalizations, surgeries, and ER visits in previous 12 months Vitals Screenings to include cognitive, depression, and falls Referrals and appointments  Orders Placed This Encounter  Procedures   MM 3D SCREENING MAMMOGRAM BILATERAL BREAST    Standing Status:   Future    Expiration Date:   06/08/2025    Reason for Exam (SYMPTOM  OR DIAGNOSIS REQUIRED):   screening for breast cancer    Preferred imaging location?:   [facility]   Ambulatory referral to Gastroenterology    Referral  Priority:   Routine    Referral Type:   Consultation    Referral Reason:   Specialty Services Required    Referred to Provider:   Leigh Elspeth SQUIBB, MD    Number of Visits Requested:   1   In addition, I have reviewed and discussed with patient certain preventive protocols, quality metrics, and best practice recommendations. A written personalized care plan for preventive services as well as general preventive health recommendations were provided to patient.   Verdie CHRISTELLA Saba, CMA   06/08/2024   Return in 1 year (on 06/08/2025).  After Visit Summary: (In Person-Declined) Patient declined AVS at this time.  Nurse Notes: Scheduled a 2026 AWV appt.

## 2024-06-10 ENCOUNTER — Other Ambulatory Visit (HOSPITAL_COMMUNITY): Payer: Self-pay

## 2024-06-10 ENCOUNTER — Other Ambulatory Visit: Payer: Self-pay | Admitting: Emergency Medicine

## 2024-06-10 DIAGNOSIS — R110 Nausea: Secondary | ICD-10-CM

## 2024-06-10 MED ORDER — ONDANSETRON 4 MG PO TBDP
4.0000 mg | ORAL_TABLET | Freq: Three times a day (TID) | ORAL | 4 refills | Status: AC | PRN
  Filled 2024-06-10: qty 20, 7d supply, fill #0
  Filled 2024-06-30: qty 20, 7d supply, fill #1
  Filled 2024-08-21: qty 20, 7d supply, fill #2

## 2024-07-06 ENCOUNTER — Ambulatory Visit

## 2024-07-08 ENCOUNTER — Inpatient Hospital Stay: Admission: RE | Admit: 2024-07-08 | Discharge: 2024-07-08 | Attending: Emergency Medicine

## 2024-07-08 ENCOUNTER — Encounter: Payer: Self-pay | Admitting: Hematology and Oncology

## 2024-07-08 DIAGNOSIS — Z1231 Encounter for screening mammogram for malignant neoplasm of breast: Secondary | ICD-10-CM | POA: Diagnosis not present

## 2024-08-09 ENCOUNTER — Other Ambulatory Visit: Payer: Self-pay | Admitting: Emergency Medicine

## 2024-08-09 ENCOUNTER — Other Ambulatory Visit (HOSPITAL_COMMUNITY): Payer: Self-pay

## 2024-08-09 DIAGNOSIS — M792 Neuralgia and neuritis, unspecified: Secondary | ICD-10-CM

## 2024-08-09 DIAGNOSIS — M797 Fibromyalgia: Secondary | ICD-10-CM

## 2024-08-09 MED ORDER — GABAPENTIN 300 MG PO CAPS
300.0000 mg | ORAL_CAPSULE | Freq: Four times a day (QID) | ORAL | 1 refills | Status: AC
  Filled 2024-08-09 – 2024-08-22 (×2): qty 540, 68d supply, fill #0

## 2024-08-11 ENCOUNTER — Other Ambulatory Visit (HOSPITAL_COMMUNITY): Payer: Self-pay

## 2024-08-22 ENCOUNTER — Other Ambulatory Visit (HOSPITAL_COMMUNITY): Payer: Self-pay

## 2024-08-22 ENCOUNTER — Other Ambulatory Visit: Payer: Self-pay

## 2024-08-23 ENCOUNTER — Other Ambulatory Visit (HOSPITAL_COMMUNITY): Payer: Self-pay

## 2024-08-23 ENCOUNTER — Encounter: Payer: Self-pay | Admitting: Emergency Medicine

## 2024-08-23 ENCOUNTER — Ambulatory Visit: Admitting: Emergency Medicine

## 2024-08-23 ENCOUNTER — Encounter (HOSPITAL_COMMUNITY): Payer: Self-pay

## 2024-08-23 ENCOUNTER — Encounter: Payer: Self-pay | Admitting: Hematology and Oncology

## 2024-08-23 ENCOUNTER — Other Ambulatory Visit: Payer: Self-pay

## 2024-08-23 VITALS — BP 146/100 | HR 93 | Temp 97.9°F | Wt 217.0 lb

## 2024-08-23 DIAGNOSIS — H9041 Sensorineural hearing loss, unilateral, right ear, with unrestricted hearing on the contralateral side: Secondary | ICD-10-CM | POA: Diagnosis not present

## 2024-08-23 DIAGNOSIS — Z6836 Body mass index (BMI) 36.0-36.9, adult: Secondary | ICD-10-CM | POA: Diagnosis not present

## 2024-08-23 DIAGNOSIS — E66812 Obesity, class 2: Secondary | ICD-10-CM | POA: Diagnosis not present

## 2024-08-23 MED ORDER — WEGOVY 1.5 MG PO TABS
1.5000 mg | ORAL_TABLET | Freq: Every day | ORAL | 1 refills | Status: AC
  Filled 2024-08-23: qty 30, 30d supply, fill #0

## 2024-08-23 NOTE — Assessment & Plan Note (Signed)
 Normal exam.  Decreased hearing on the right side Recommend ENT evaluation. Referral placed today

## 2024-08-23 NOTE — Progress Notes (Signed)
 [name] 69 y.o.   Chief Complaint  Patient presents with   Ear Pain    Right ear sounds muffled/humming sound. Sounds like water in ear but no pain Pt states that she has been dealing with this for about a week     HISTORY OF PRESENT ILLNESS: This is a 69 y.o. female complaining of decreased hearing from right ear No pain or any other associated symptoms Also interested in starting oral Wegovy  No other complaints or medical concerns today.  HPI   Prior to Admission medications  Medication Sig Start Date End Date Taking? Authorizing Provider  albuterol  (PROAIR  HFA) 108 (90 Base) MCG/ACT inhaler Inhale 1-2 puffs into the lungs every 4 (four) hours as needed for wheezing or shortness of breath. 08/21/22   Moishe Chiquita HERO, NP  atorvastatin  (LIPITOR) 20 MG tablet Take 1 tablet (20 mg total) by mouth daily. 10/05/23   Janelle Culton Jose, MD  benzonatate  (TESSALON ) 100 MG capsule Take 1 capsule (100 mg total) by mouth 3 (three) times daily as needed for cough. 04/12/24   Gladis Elsie BROCKS, PA-C  busPIRone  (BUSPAR ) 7.5 MG tablet Take 1 tablet (7.5 mg total) by mouth 2 (two) times daily. 04/06/24   Lovinia Snare Jose, MD  butalbital -acetaminophen -caffeine  (FIORICET ) 50-325-40 MG tablet Take 1 tablet by mouth every 6 (six) hours as needed for headache. 02/20/23   Norleen Lynwood ORN, MD  cyclobenzaprine  (FLEXERIL ) 10 MG tablet Take 1 tablet (10 mg total) by mouth 3 (three) times daily as needed for muscle spasms. 03/17/24   Purcell Emil Schanz, MD  DULoxetine  (CYMBALTA ) 60 MG capsule Take 1 capsule (60 mg total) by mouth daily. 03/17/24   Purcell Emil Schanz, MD  empagliflozin  (JARDIANCE ) 10 MG TABS tablet Take 1 tablet (10 mg total) by mouth daily before breakfast. 04/27/24   Purcell Emil Schanz, MD  ferrous gluconate  [facility]) 324 MG tablet Take 1 tablet (324 mg total) by mouth daily with breakfast. 05/19/24   May, Deanna J, NP  fluticasone  (FLONASE ) 50 MCG/ACT nasal spray Place 1 spray into  both nostrils daily. 04/06/24   Purcell Emil Schanz, MD  furosemide  (LASIX ) 20 MG tablet Take 1 tablet (20 mg total) by mouth daily. 10/16/23   Rana Lum CROME, NP  gabapentin  (NEURONTIN ) 300 MG capsule Take 1-2 capsules (300-600 mg total) by mouth 4 (four) times daily. 08/09/24   Purcell Emil Schanz, MD  linaclotide  (LINZESS ) 72 MCG capsule Take 1 capsule (72 mcg total) by mouth daily before breakfast. 02/12/24   May, Deanna J, NP  lisinopril  (ZESTRIL ) 20 MG tablet Take 1 tablet (20 mg total) by mouth daily. 05/24/24   Purcell Emil Schanz, MD  metoprolol  succinate (TOPROL  XL) 25 MG 24 hr tablet Take 1 tablet (25 mg total) by mouth daily. 04/07/24   Anner Alm ORN, MD  ondansetron  (ZOFRAN -ODT) 4 MG disintegrating tablet Dissolve 1 tablet (4 mg total) in mouth every 8 (eight) hours as needed for nausea or vomiting. 06/10/24   Purcell Emil Schanz, MD  pantoprazole  (PROTONIX ) 40 MG tablet Take 1 tablet (40 mg total) by mouth 2 (two) times daily for 10 weeks, THEN 1 tablet (40 mg total) daily. 05/02/24 09/30/24  May, Deanna J, NP  potassium chloride  SA (KLOR-CON  M) 20 MEQ tablet Take 1 tablet (20 mEq total) by mouth daily. 04/07/24   Anner Alm ORN, MD  topiramate  (TOPAMAX ) 50 MG tablet Take 1 tablet (50 mg total) by mouth 2 (two) times daily. 04/06/24   Airianna Kreischer Jose,  MD  vortioxetine  HBr (TRINTELLIX ) 10 MG TABS tablet Take 1 tablet (10 mg total) by mouth daily. 05/12/24   Purcell Emil Schanz, MD  zolpidem  (AMBIEN ) 5 MG tablet Take 1 tablet (5 mg total) by mouth at bedtime as needed for sleep. 04/06/24   Purcell Emil Schanz, MD    Allergies[1]  Patient Active Problem List   Diagnosis Date Noted   Agatston CAC score, <100 06/06/2024   Poor sleep pattern 05/25/2024   Stage 3a chronic kidney disease (HCC) 04/27/2024   Symptomatic anemia 10/08/2023   Postviral fatigue syndrome 10/22/2022   Prediabetes 11/18/2021   Moderate episode of recurrent major depressive disorder (HCC) 10/01/2021    Hx of adenomatous colonic polyps 01/02/2021   Pulmonary arterial hypertension (HCC) 07/04/2020   Kidney cysts 07/04/2020   Hyperlipidemia 07/04/2020   Depression 07/04/2020   Iron  deficiency anemia 10/13/2018   DOE (dyspnea on exertion) 03/23/2018   Obesity, morbid, BMI 40.0-49.9 (HCC) 03/23/2018   Chronic diastolic heart failure (HCC) 03/23/2018   Grade I diastolic dysfunction 07/20/2017   Chest wall pain 06/13/2017   Migraine without aura and without status migrainosus, not intractable 03/02/2017   Class 2 severe obesity due to excess calories with serious comorbidity and body mass index (BMI) of 36.0 to 36.9 in adult 01/13/2017   Hiatal hernia 10/28/2016   Pure hypercholesterolemia 10/28/2016   Chronic kidney disease 06/01/2015   Gastroesophageal reflux disease 06/01/2015   Goiter 06/01/2015   Essential hypertension 06/01/2015   Chronic pain syndrome 05/28/2015   Fibromyalgia 05/28/2015   OSA on CPAP 05/28/2015   Adjustment disorder with mixed anxiety and depressed mood 05/28/2015    Past Medical History:  Diagnosis Date   Allergy    [name], Flonase    Anemia    Anxiety    Arthritis    Chronic kidney disease    Colon polyps    Depression    Fibromyalgia    Gastritis    GERD (gastroesophageal reflux disease)    Hernia, hiatal    High risk for colon cancer    HLD (hyperlipidemia)    Hypertension    Migraine    Neuropathy    Osteoporosis    Pneumonia    Sleep apnea    c-pap nightly   Sleep apnea with use of continuous positive airway pressure (CPAP)    Thyroid  goiter     Past Surgical History:  Procedure Laterality Date   48-Hour Holter Monitor  06/2017   Mostly normal sinus rhythm.  Average heart rate 71 bpm.  Minimum 54 bpm.  Maximum sinus rate 114 bpm.  Rare PACs and PVCs.  No A. fib, atrial flutter, SVT or VT.  One brief run of 8 beat PAT.   ABDOMINAL HYSTERECTOMY     ovaries intact; DUB.  No dysplasia.   FRACTURE SURGERY N/A    Phreesia 09/25/2020    JOINT REPLACEMENT     OPEN REDUCTION INTERNAL FIXATION (ORIF) DISTAL RADIAL FRACTURE Right 07/09/2017   Procedure: RIGHT WRIST OPEN REDUCTION INTERNAL FIXATION (ORIF) DISTAL RADIAL FRACTURE AND REPAIR AS INDICATED;  Surgeon: Shari Easter, MD;  Location: [facility] OR;  Service: Orthopedics;  Laterality: Right;   PARTIAL HYSTERECTOMY     ROTATOR CUFF REPAIR Right 2009   SHOULDER SURGERY Right    shoulder dislocation, fall, and elbow unla nerve   SHOULDER SURGERY Right 2015   labral tear   TRANSTHORACIC ECHOCARDIOGRAM  06/2017    EF 55-60%.  grade 1 diastolic dysfunction.  Otherwise normal   TUBAL  LIGATION     ULNAR NERVE REPAIR Right 08/19/2014    Social History   Socioeconomic History   Marital status: Married    Spouse name: Not on file   Number of children: 3   Years of education: Not on file   Highest education level: GED or equivalent  Occupational History   Occupation: unemployed  Tobacco Use   Smoking status: Former    Current packs/day: 0.00    Types: Cigarettes    Quit date: 08/04/1998    Years since quitting: 26.0   Smokeless tobacco: Never  Vaping Use   Vaping status: Never Used  Substance and Sexual Activity   Alcohol use: No   Drug use: No   Sexual activity: Yes    Birth control/protection: Surgical, Post-menopausal  Other Topics Concern   Not on file  Social History Narrative   Marital status: married x 20 years; moderately happy     Children: 3 children (47 Kayla, 76 Dianna, 40 April); 8 grandchildren; 0 gg     Lives: with husband/Steve, April, 2 granddaughters     Employment:  Unemployed; quit working 2015 with work related injury; awaiting disability in 2018; disability approved in 02/2017.       Tobacco: quit smoking in 2016; smoked x 2 years.      Alcohol: none      Drugs: none      Exercise: rarely in 2018         Social Drivers of Health   Tobacco Use: Medium Risk (06/08/2024)   Patient History    Smoking Tobacco Use: Former    Smokeless Tobacco Use:  Never    Passive Exposure: Not on file  Financial Resource Strain: Low Risk (06/01/2024)   Overall Financial Resource Strain (CARDIA)    Difficulty of Paying Living Expenses: Not very hard  Food Insecurity: No Food Insecurity (06/08/2024)   Epic    Worried About Programme Researcher, Broadcasting/film/video in the Last Year: Never true    Ran Out of Food in the Last Year: Never true  Transportation Needs: No Transportation Needs (06/08/2024)   Epic    Lack of Transportation (Medical): No    Lack of Transportation (Non-Medical): No  Physical Activity: Inactive (06/08/2024)   Exercise Vital Sign    Days of Exercise per Week: 0 days    Minutes of Exercise per Session: 0 min  Stress: No Stress Concern Present (06/08/2024)   [employer] of Occupational Health - Occupational Stress Questionnaire    Feeling of Stress: Only a little  Recent Concern: Stress - Stress Concern Present (06/01/2024)   [employer] of Occupational Health - Occupational Stress Questionnaire    Feeling of Stress: To some extent  Social Connections: Socially Integrated (06/08/2024)   Social Connection and Isolation Panel    Frequency of Communication with Friends and Family: More than three times a week    Frequency of Social Gatherings with Friends and Family: Once a week    Attends Religious Services: More than 4 times per year    Active Member of Clubs or Organizations: Yes    Attends Banker Meetings: More than 4 times per year    Marital Status: Married  Catering Manager Violence: Not At Risk (06/08/2024)   Epic    Fear of Current or Ex-Partner: No    Emotionally Abused: No    Physically Abused: No    Sexually Abused: No  Depression (PHQ2-9): Low Risk (06/08/2024)   Depression (PHQ2-9)  PHQ-2 Score: 0  Recent Concern: Depression (PHQ2-9) - Medium Risk (04/27/2024)   Depression (PHQ2-9)    PHQ-2 Score: 6  Alcohol Screen: Not on file  Housing: Unknown (06/08/2024)   Epic    Unable to Pay for Housing in the Last  Year: No    Number of Times Moved in the Last Year: Not on file    Homeless in the Last Year: No  Utilities: Not At Risk (06/08/2024)   Epic    Threatened with loss of utilities: No  Health Literacy: Adequate Health Literacy (06/08/2024)   B1300 Health Literacy    Frequency of need for help with medical instructions: Never    Family History  Problem Relation Age of Onset   Heart disease Mother 57       AMI/CAD/CHF as cause of death   Hypertension Mother    Hyperlipidemia Mother    Hypothyroidism Mother    Depression Mother    Gout Mother    Heart disease Father 66       AMI   Hypertension Father    Stroke Father 29       mild CVA   Prostate cancer Father    Hyperlipidemia Father    Cancer Father 15       prostate cancer   Cancer Sister        Basal cell carcinoma scalp   Depression Sister    Arthritis Sister    Depression Sister    Cancer Maternal Grandmother        type unknown   Cancer Maternal Grandfather        type unknown   Heart disease Paternal Grandmother    Heart defect Paternal Grandfather    Hypertension Brother    Hyperlipidemia Brother    Cancer Brother        prostate cancer   Melanoma Brother    Hyperlipidemia Brother    Hypertension Brother    Cancer Brother        melanoma   Hypertension Brother    Hyperlipidemia Brother    Melanoma Brother    Cancer Brother 25       melanoma   Hypertension Brother    Depression Brother    Hypertension Brother    Gout Brother    Colon cancer Neg Hx    Esophageal cancer Neg Hx    Stomach cancer Neg Hx    Rectal cancer Neg Hx    Breast cancer Neg Hx      Review of Systems  Constitutional: Negative.  Negative for chills and fever.  HENT: Negative.  Negative for congestion and sore throat.   Respiratory: Negative.  Negative for cough and shortness of breath.   Cardiovascular: Negative.  Negative for chest pain and palpitations.  Gastrointestinal:  Negative for abdominal pain, nausea and vomiting.   Genitourinary: Negative.  Negative for dysuria and hematuria.  Skin: Negative.  Negative for rash.  Neurological: Negative.  Negative for dizziness and headaches.  All other systems reviewed and are negative.   Vitals:   08/23/24 1406 08/23/24 1408  BP: (!) 146/100 (!) 146/100  Pulse: 93   Temp: 97.9 F (36.6 C)   SpO2: 99%     Physical Exam Vitals reviewed.  Constitutional:      Appearance: Normal appearance. She is obese.  HENT:     Head: Normocephalic.     Right Ear: Tympanic membrane, ear canal and external ear normal.     Left Ear: Tympanic membrane, ear  canal and external ear normal.     Mouth/Throat:     Mouth: Mucous membranes are moist.     Pharynx: Oropharynx is clear.  Eyes:     Extraocular Movements: Extraocular movements intact.     Pupils: Pupils are equal, round, and reactive to light.  Cardiovascular:     Rate and Rhythm: Normal rate and regular rhythm.     Pulses: Normal pulses.     Heart sounds: Normal heart sounds.  Pulmonary:     Effort: Pulmonary effort is normal.     Breath sounds: Normal breath sounds.  Musculoskeletal:     Cervical back: No tenderness.  Lymphadenopathy:     Cervical: No cervical adenopathy.  Skin:    General: Skin is warm and dry.     Capillary Refill: Capillary refill takes less than 2 seconds.  Neurological:     General: No focal deficit present.     Mental Status: She is alert and oriented to person, place, and time.  Psychiatric:        Mood and Affect: Mood normal.        Behavior: Behavior normal.      ASSESSMENT & PLAN: Problem List Items Addressed This Visit       Nervous and Auditory   Sensorineural hearing loss (SNHL) of right ear with unrestricted hearing of left ear - Primary   Normal exam.  Decreased hearing on the right side Recommend ENT evaluation. Referral placed today      Relevant Orders   Ambulatory referral to ENT     Other   Class 2 severe obesity due to excess calories with serious  comorbidity and body mass index (BMI) of 36.0 to 36.9 in adult   Diet and nutrition discussed. Advised to decrease amount of daily carbohydrate intake and daily calories and increase amount of plant-based protein in her diet Benefits of exercise discussed Recommend to start daily Wegovy , oral tablet.       Relevant Medications   semaglutide -weight management (WEGOVY ) 1.5 MG tablet   Patient Instructions  Hearing Loss Hearing loss is a partial or total loss of the ability to hear. This can be temporary or permanent, and it can happen in one or both ears. There are two types of hearing loss. You can have just one type or both types. You may have a problem with: Damage to your hearing nerves (sensorineural hearing loss). This type of hearing loss is more likely to be permanent. A hearing aid is often the best treatment. Sound getting to your inner ear (conductive hearing loss). This type of hearing loss can usually be treated medically or surgically. Medical care is necessary to treat hearing loss properly and to prevent the condition from getting worse. Your hearing may partially or completely come back, depending on what caused your hearing loss and how severe it is. In some cases, hearing loss is permanent. What are the causes? Common causes of hearing loss include: Too much wax in the ear canal. Infection of the ear canal or middle ear. Fluid in the middle ear. Injury to the ear or surrounding area. An object stuck in the ear. A history of prolonged exposure to loud sounds, such as music. Less common causes of hearing loss include: Tumors in the ear. Viral or bacterial infections, such as meningitis. A hole in the eardrum (perforated eardrum). Problems with the hearing nerve that sends signals between the brain and the ear. Certain medicines. What are the signs or symptoms? Symptoms  of this condition may include: Difficulty telling the difference between sounds. Difficulty  following a conversation when there is background noise. Lack of response to sounds in your environment. This may be most noticeable when you do not respond to startling sounds. Needing to turn up the volume on the television, radio, or other devices. Ringing in the ears. Dizziness. How is this diagnosed? This condition is diagnosed based on: A physical exam. A hearing test (audiometry). The test will be performed by a hearing specialist (audiologist). Imaging tests, such as an MRI or CT scan. You may also be referred to an ear, nose, and throat (ENT) specialist (otolaryngologist). How is this treated? Treatment for hearing loss may include: Earwax removal. Medicines to treat or prevent infection (antibiotics). Medicines to reduce inflammation (corticosteroids). Hearing aids or assistive listening devices. Follow these instructions at home: If you were prescribed an antibiotic medicine, take it as told by your health care provider. Do not stop taking the antibiotic even if you start to feel better. Take over-the-counter and prescription medicines only as told by your health care provider. Avoid loud noises. Use hearing protection if you are exposed to loud noises or work in a noisy environment. Return to your normal activities as told by your health care provider. Ask your health care provider what activities are safe for you. Keep all follow-up visits. This is important. Contact a health care provider if: You feel dizzy. You develop new symptoms. You vomit or feel nauseous. You have a fever. Get help right away if: You develop sudden changes in your vision. You have severe ear pain. You have new or increased weakness. You have a severe headache. Summary Hearing loss is a decreased ability to hear sounds around you. It can be temporary or permanent. Treatment will depend on the cause of your hearing loss. It may include earwax removal, medicines, or a hearing aid. Your hearing may  partially or completely come back, depending on what caused your hearing loss and how severe it is. Keep all follow-up visits. This is important. This information is not intended to replace advice given to you by your health care provider. Make sure you discuss any questions you have with your health care provider. Document Revised: 05/30/2021 Document Reviewed: 05/30/2021 Elsevier Patient Education  2024 [employer]    Emil Schaumann, MD [facility]     [1]  Allergies Allergen Reactions   Sulfa Antibiotics Itching

## 2024-08-23 NOTE — Assessment & Plan Note (Signed)
 Diet and nutrition discussed. Advised to decrease amount of daily carbohydrate intake and daily calories and increase amount of plant-based protein in her diet Benefits of exercise discussed Recommend to start daily Wegovy , oral tablet.

## 2024-08-23 NOTE — Patient Instructions (Signed)
Hearing Loss Hearing loss is a partial or total loss of the ability to hear. This can be temporary or permanent, and it can happen in one or both ears. There are two types of hearing loss. You can have just one type or both types. You may have a problem with: Damage to your hearing nerves (sensorineural hearing loss). This type of hearing loss is more likely to be permanent. A hearing aid is often the best treatment. Sound getting to your inner ear (conductive hearing loss). This type of hearing loss can usually be treated medically or surgically. Medical care is necessary to treat hearing loss properly and to prevent the condition from getting worse. Your hearing may partially or completely come back, depending on what caused your hearing loss and how severe it is. In some cases, hearing loss is permanent. What are the causes? Common causes of hearing loss include: Too much wax in the ear canal. Infection of the ear canal or middle ear. Fluid in the middle ear. Injury to the ear or surrounding area. An object stuck in the ear. A history of prolonged exposure to loud sounds, such as music. Less common causes of hearing loss include: Tumors in the ear. Viral or bacterial infections, such as meningitis. A hole in the eardrum (perforated eardrum). Problems with the hearing nerve that sends signals between the brain and the ear. Certain medicines. What are the signs or symptoms? Symptoms of this condition may include: Difficulty telling the difference between sounds. Difficulty following a conversation when there is background noise. Lack of response to sounds in your environment. This may be most noticeable when you do not respond to startling sounds. Needing to turn up the volume on the television, radio, or other devices. Ringing in the ears. Dizziness. How is this diagnosed? This condition is diagnosed based on: A physical exam. A hearing test (audiometry). The test will be performed  by a hearing specialist (audiologist). Imaging tests, such as an MRI or CT scan. You may also be referred to an ear, nose, and throat (ENT) specialist (otolaryngologist). How is this treated? Treatment for hearing loss may include: Earwax removal. Medicines to treat or prevent infection (antibiotics). Medicines to reduce inflammation (corticosteroids). Hearing aids or assistive listening devices. Follow these instructions at home: If you were prescribed an antibiotic medicine, take it as told by your health care provider. Do not stop taking the antibiotic even if you start to feel better. Take over-the-counter and prescription medicines only as told by your health care provider. Avoid loud noises. Use hearing protection if you are exposed to loud noises or work in a noisy environment. Return to your normal activities as told by your health care provider. Ask your health care provider what activities are safe for you. Keep all follow-up visits. This is important. Contact a health care provider if: You feel dizzy. You develop new symptoms. You vomit or feel nauseous. You have a fever. Get help right away if: You develop sudden changes in your vision. You have severe ear pain. You have new or increased weakness. You have a severe headache. Summary Hearing loss is a decreased ability to hear sounds around you. It can be temporary or permanent. Treatment will depend on the cause of your hearing loss. It may include earwax removal, medicines, or a hearing aid. Your hearing may partially or completely come back, depending on what caused your hearing loss and how severe it is. Keep all follow-up visits. This is important. This information is  not intended to replace advice given to you by your health care provider. Make sure you discuss any questions you have with your health care provider. Document Revised: 05/30/2021 Document Reviewed: 05/30/2021 Elsevier Patient Education  2024 [employer].

## 2024-08-24 ENCOUNTER — Other Ambulatory Visit (HOSPITAL_COMMUNITY): Payer: Self-pay

## 2024-08-24 ENCOUNTER — Other Ambulatory Visit: Payer: Self-pay

## 2024-08-26 ENCOUNTER — Other Ambulatory Visit: Payer: Self-pay

## 2024-08-26 ENCOUNTER — Encounter: Payer: Self-pay | Admitting: Hematology and Oncology

## 2024-08-26 ENCOUNTER — Other Ambulatory Visit (HOSPITAL_COMMUNITY): Payer: Self-pay

## 2024-09-01 ENCOUNTER — Other Ambulatory Visit: Payer: Self-pay

## 2024-09-06 ENCOUNTER — Other Ambulatory Visit: Payer: Self-pay

## 2024-09-28 ENCOUNTER — Institutional Professional Consult (permissible substitution) (INDEPENDENT_AMBULATORY_CARE_PROVIDER_SITE_OTHER): Admitting: Otolaryngology

## 2024-10-26 ENCOUNTER — Ambulatory Visit: Admitting: Emergency Medicine

## 2024-11-14 ENCOUNTER — Ambulatory Visit: Admitting: "Endocrinology

## 2024-11-18 ENCOUNTER — Inpatient Hospital Stay

## 2024-11-18 ENCOUNTER — Inpatient Hospital Stay: Admitting: Hematology and Oncology

## 2025-06-14 ENCOUNTER — Ambulatory Visit

## 2025-06-14 ENCOUNTER — Encounter: Admitting: Emergency Medicine
